# Patient Record
Sex: Female | Born: 1950 | Race: Black or African American | Hispanic: No | Marital: Single
Health system: Midwestern US, Academic
[De-identification: ages and names within clinical notes are randomized; demographics above are authoritative.]

## PROBLEM LIST (undated history)

## (undated) DIAGNOSIS — K2970 Gastritis, unspecified, without bleeding: Secondary | ICD-10-CM

## (undated) DIAGNOSIS — T8859XA Other complications of anesthesia, initial encounter: Secondary | ICD-10-CM

## (undated) DIAGNOSIS — I2510 Atherosclerotic heart disease of native coronary artery without angina pectoris: Secondary | ICD-10-CM

## (undated) DIAGNOSIS — K219 Gastro-esophageal reflux disease without esophagitis: Secondary | ICD-10-CM

## (undated) DIAGNOSIS — C189 Malignant neoplasm of colon, unspecified: Secondary | ICD-10-CM

## (undated) DIAGNOSIS — I4891 Unspecified atrial fibrillation: Secondary | ICD-10-CM

## (undated) DIAGNOSIS — E785 Hyperlipidemia, unspecified: Secondary | ICD-10-CM

## (undated) DIAGNOSIS — I10 Essential (primary) hypertension: Secondary | ICD-10-CM

## (undated) DIAGNOSIS — D509 Iron deficiency anemia, unspecified: Secondary | ICD-10-CM

## (undated) DIAGNOSIS — K648 Other hemorrhoids: Secondary | ICD-10-CM

## (undated) DIAGNOSIS — K5790 Diverticulosis of intestine, part unspecified, without perforation or abscess without bleeding: Secondary | ICD-10-CM

## (undated) DIAGNOSIS — I495 Sick sinus syndrome: Secondary | ICD-10-CM

## (undated) DIAGNOSIS — K644 Residual hemorrhoidal skin tags: Secondary | ICD-10-CM

## (undated) HISTORY — DX: Sick sinus syndrome: I49.5

## (undated) HISTORY — DX: Residual hemorrhoidal skin tags: K64.4

## (undated) HISTORY — DX: Other hemorrhoids: K64.8

## (undated) HISTORY — DX: Gastritis, unspecified, without bleeding: K29.70

## (undated) HISTORY — DX: Diverticulosis of intestine, part unspecified, without perforation or abscess without bleeding: K57.90

## (undated) HISTORY — DX: Gastro-esophageal reflux disease without esophagitis: K21.9

## (undated) HISTORY — DX: Unspecified atrial fibrillation: I48.91

## (undated) HISTORY — DX: Atherosclerotic heart disease of native coronary artery without angina pectoris: I25.10

## (undated) HISTORY — DX: Essential (primary) hypertension: I10

## (undated) HISTORY — DX: Malignant neoplasm of colon, unspecified: C18.9

## (undated) HISTORY — DX: Hyperlipidemia, unspecified: E78.5

## (undated) HISTORY — DX: Iron deficiency anemia, unspecified: D50.9

## (undated) HISTORY — PX: BLADDER REPAIR: SHX76

## (undated) HISTORY — PX: TONSILLECTOMY: SUR1361

---

## 2008-08-30 HISTORY — PX: ABDOMINAL HYSTERECTOMY: SHX81

## 2008-09-13 ENCOUNTER — Encounter (INDEPENDENT_AMBULATORY_CARE_PROVIDER_SITE_OTHER): Payer: Self-pay | Admitting: Obstetrics and Gynecology

## 2008-09-13 ENCOUNTER — Ambulatory Visit (HOSPITAL_COMMUNITY): Admission: RE | Admit: 2008-09-13 | Discharge: 2008-09-14 | Payer: Self-pay | Admitting: Obstetrics and Gynecology

## 2010-07-09 LAB — CBC
HCT: 26.4 % — ABNORMAL LOW (ref 36.0–46.0)
HCT: 26.8 % — ABNORMAL LOW (ref 36.0–46.0)
HCT: 36.8 % (ref 36.0–46.0)
Hemoglobin: 9.3 g/dL — ABNORMAL LOW (ref 12.0–15.0)
Hemoglobin: 9.4 g/dL — ABNORMAL LOW (ref 12.0–15.0)
Hemoglobin: 13.1 g/dL (ref 12.0–15.0)
MCHC: 35.2 g/dL (ref 30.0–36.0)
MCHC: 35.3 g/dL (ref 30.0–36.0)
MCHC: 35.6 g/dL (ref 30.0–36.0)
MCV: 92.1 fL (ref 78.0–100.0)
MCV: 92.5 fL (ref 78.0–100.0)
MCV: 91.2 fL (ref 78.0–100.0)
Platelets: 144 10*3/uL — ABNORMAL LOW (ref 150–400)
Platelets: 145 10*3/uL — ABNORMAL LOW (ref 150–400)
Platelets: 190 10*3/uL (ref 150–400)
RBC: 2.86 MIL/uL — ABNORMAL LOW (ref 3.87–5.11)
RBC: 2.9 MIL/uL — ABNORMAL LOW (ref 3.87–5.11)
RBC: 4.04 MIL/uL (ref 3.87–5.11)
RDW: 13.1 % (ref 11.5–15.5)
RDW: 13.3 % (ref 11.5–15.5)
RDW: 13.4 % (ref 11.5–15.5)
WBC: 10.1 10*3/uL (ref 4.0–10.5)
WBC: 9 10*3/uL (ref 4.0–10.5)
WBC: 5.1 10*3/uL (ref 4.0–10.5)

## 2010-07-09 LAB — BASIC METABOLIC PANEL
BUN: 9 mg/dL (ref 6–23)
CO2: 29 mEq/L (ref 19–32)
Calcium: 10.8 mg/dL — ABNORMAL HIGH (ref 8.4–10.5)
Chloride: 103 mEq/L (ref 96–112)
Creatinine, Ser: 0.71 mg/dL (ref 0.4–1.2)
GFR calc Af Amer: >60 mL/min (ref >60)
GFR calc non Af Amer: >60 mL/min (ref >60)
Glucose, Bld: 92 mg/dL (ref 70–99)
Potassium: 3.9 mEq/L (ref 3.5–5.1)
Sodium: 139 mEq/L (ref 135–145)

## 2010-07-09 LAB — ABO/RH: ABO/RH(D): O POS

## 2010-07-09 LAB — TYPE AND SCREEN
ABO/RH(D): O POS
Antibody Screen: NEGATIVE

## 2010-07-09 LAB — PREGNANCY, URINE: Preg Test, Ur: NEGATIVE

## 2010-07-09 LAB — PROTIME-INR
INR: 0.9 (ref 0.00–1.49)
Prothrombin Time: 12 seconds (ref 11.6–15.2)

## 2010-07-09 LAB — APTT: aPTT: 29 seconds (ref 24–37)

## 2010-08-14 NOTE — Op Note (Signed)
NAME:  Alexandria French, Alexandria French               ACCOUNT NO.:  [id number]   MEDICAL RECORD NO.:  [id number]          PATIENT TYPE:  OIB   LOCATION:  9309                          FACILITY:  [facility]   PHYSICIAN:  Michelle L. Grewal, M.D.DATE OF BIRTH:  03-20-1951   DATE OF PROCEDURE:  09/13/2008  DATE OF DISCHARGE:  09/14/2008                               OPERATIVE REPORT   PREOPERATIVE DIAGNOSES:  Symptomatic uterine prolapse, cystocele and  rectocele.   POSTOPERATIVE DIAGNOSES:  Symptomatic uterine prolapse, cystocele and  rectocele.   PROCEDURE:  Total vaginal hysterectomy, posterior repair, iliococcygeal  ligament fixation in the vaginal cuff and Dr. Sherron Monday, performed  anterior repair, replaced the anterior graft and cystoscopy and that  will be dictated separately by him.   SURGEON:  Michelle L. Vincente Poli, MD   ASSISTANT:  Martina Sinner, MD   ANESTHESIA:  General.   ESTIMATED BLOOD LOSS:  200 mL.   DRAINS:  Foley and vaginal packing.   PATHOLOGY:  Uterus and cervix.   DESCRIPTION OF PROCEDURE:  The patient was taken to the operating room  after informed consent was obtained.  She was then intubated without  difficulty.  She was prepped and draped in the usual sterile fashion.  Exam under anesthesia revealed a grade 3 cystocele, grade 3 cervical  uterine prolapse, and a grade 2 rectocele, and a Foley catheter was  inserted.  Time-out was performed in standard fashion according to  protocol.  A tenaculum was used to grasp the cervix, we were able to  pull the cervix and the uterus all the way and everted all the way out  of the vagina without much difficulty.  A small and circumferential  incision was made around cervix using the Bovie.  We then retracted  using blunt and sharp dissection.  The vaginal epithelium all the way  around the cervix was very elongated because of the prolapse.  I was  then able to identify the posterior cul-de-sac which is approximately 5  cm of the  cervix and entered that sharply using Metzenbaum scissors.  A  weighted speculum was placed into the cul-de-sac.  The anterior vaginal  epithelium was very thickened and hypertrophied probably because of her  long-standing prolapse.  So I used a curved Heaney clamp and placed  across the uterosacral cardinal ligaments on either side.  Each pedicle  was cut and suture ligated using 0 Vicryl suture.  I then used careful  dissection using sharp and blunt dissection anteriorly and finally was  able entered into the anterior cul-de-sac.  I then placed over the  remainder of the broad ligament clamped on either side using curved  Heaney clamp.  The pedicles were secured using a suture ligature of 0  Vicryl suture and free tie of 0 Vicryl suture.  The uterine body was  actually relatively small.  I inspected the inside then peritoneal  cavity.  The adnexa were very high and appeared normal by palpation.  There were no adhesions noted.  At this point, the posterior cuff was  closed in a running stitch using 0 Vicryl in  a running stitch.  At this  point, I left the cuff open Dr. Jacquelyne Balint became the primary surgeon and  he performed a cystoscopy, then an anterior repair with placement of the  graft anteriorly with a fixation of the graft to the sacrospinous  ligament on both sides.  At that point, we then closed the vaginal cuff  completely anterior to posterior.  All sponge, lap, and instrument  counts were complete.  She at that point had much improvement with her  support of the anterior vaginal vault until had some relaxation  posteriorly.  I then decided to perform a posterior repair and  iliococcygeal ligament fixation of the posterior vaginal cuff to pull  that up as well.  I did not want to transfix the cuff to the sacral  spinous ligament with the that would interfere the placement of this  graft.  So I made a V-shaped incision in the perineum and made a  midline incision of the posterior  wall of the vagina, was then able to  reduce the rectocele with sharp and blunt dissection.  I then was able  to dissect through the rectovaginal fascia with my finger and found an  appropriate plane on the right side.  I was able to identify the sacral  spinous ligament and the iliococcygeal muscle just beneath using the  Capio device with Metzenbaum suture.  I then placed a stitch through the  iliococcygeal muscle with correct placement checked x2.  I then  transfixed this to the underside of the vaginal cuff.  I then reduced  the rectocele using interrupted using 0 Vicryl suture and then trimmed a  redundant vaginal epithelium and then closed the vaginal cuff  posteriorly, and the vaginal epithelium in a running stitch using 2-0  Vicryl.  I then reinforced the perineum with a figure-of-eight stitch  using 0 Vicryl suture to help to bring the perineum and perineal muscles  together, and closed the remainder of the skin using 2-0 Vicryl.  At the  end of the procedure, no excessive bleeding was noted.  I placed a  packing with Estrace cream into the vagina.  A Foley catheter was placed  and draining blue urine because Dr. Sherron Monday had ordered indigo  carmine to be given during his procedure.  All sponge, lap, and  instrument counts were correct x2.  The patient was extubated and went  to recovery room.  She was then go upstairs to the third floor for  overnight observation.      Michelle L. Vincente Poli, M.D.  Electronically Signed     MLG/MEDQ  D:  09/14/2008  T:  09/15/2008  Job:  [id number]

## 2010-08-14 NOTE — H&P (Signed)
NAME:  Alexandria French, NAHM               ACCOUNT NO.:  [id number]   MEDICAL RECORD NO.:  [id number]          PATIENT TYPE:  AMB   LOCATION:  SDC                           FACILITY:  [facility]   PHYSICIAN:  Michelle L. Grewal, M.D.DATE OF BIRTH:  September 01, 1950   DATE OF ADMISSION:  DATE OF DISCHARGE:                              HISTORY & PHYSICAL   HISTORY OF PRESENT ILLNESS:  A 60 year old gravida 1, para 1, who  presents today for symptomatic prolapse.  She has uterine prolapse that  has been bothering her for some time.  She states that she feels a  protrusion from her vagina.  She reports it has even progressed over the  past several months.  She has been evaluated completely by Dr.  Jacquelyne Balint, who had recommended surgery.   MEDICATIONS:  1. Simvastatin 20 mg a day.  2. Lisinopril/HCTZ 10/12.5 mg daily.  3. Caltrate with vitamin D.  4. Glucosamine sulfate.  5. Ibuprofen.   SURGICAL HISTORY:  Significant for tubes and history of tonsillectomy.   ALLERGIES:  She has no known allergies.   MEDICAL HISTORY:  Unremarkable except for hypercholesterolemia and  hypertension and arthritis.   FAMILY HISTORY:  Positive for heart disease, osteoporosis, arthritis.   SOCIAL HISTORY:  She is not a smoker.  She does not drink alcohol.   PHYSICAL EXAMINATION:  VITAL SIGNS:  Height 5 feet and 2 inches, weight  148, and BP is 114/80.  GENERAL:  Alert and oriented in no apparent distress.  LUNGS:  Clear to auscultation bilaterally.  CARDIAC:  Regular rate and rhythm.  BREASTS:  Soft, nontender, no mass.  PELVIC:  External genitalia within normal limits.  Vagina and grade 2  cystocele, grade 2-3 cervical uterine prolapse, grade 1-2  rectocele.  No pelvic mass.   IMPRESSION:  Pelvic relaxation, symptomatic.   PLAN:  We will proceed with a TVH.  Dr. Jacquelyne Balint will perform an  anterior repair, possible graft placement, and possible sacral spinous  ligament fixation and I will perform a posterior repair.   Risk and  benefits were reviewed with the patient which include risk of  anesthesia, risk of infection, risk of bleeding, risk of pulmonary  embolism, deep venous thromboembolism, risk of injury to bowel, bladder  and surrounding organs.  All questions are answered.      Michelle L. Vincente Poli, M.D.  Electronically Signed     MLG/MEDQ  D:  09/12/2008  T:  09/13/2008  Job:  [id number]

## 2010-08-14 NOTE — Op Note (Signed)
NAME:  Alexandria French, Alexandria French               ACCOUNT NO.:  [id number]   MEDICAL RECORD NO.:  [id number]          PATIENT TYPE:  OIB   LOCATION:  9309                          FACILITY:  [facility]   PHYSICIAN:  Martina Sinner, MD DATE OF BIRTH:  [birth year]   DATE OF PROCEDURE:  09/13/2008  DATE OF DISCHARGE:                               OPERATIVE REPORT   PREOPERATIVE DIAGNOSES:  Ureteral vaginal vault prolapse, plus  cystocele, plus rectocele.   POSTOPERATIVE DIAGNOSES:  Ureteral vaginal vault prolapse, plus  cystocele, plus rectocele.   SURGERY:  Sacrospinous vault suspension, plus cystocele repair, plus  graft, plus cystoscopy.   PRIMARY SURGEON:  Martina Sinner, MD   ASSISTANT:  Stann Mainland. Grewal, MD   Transvaginal hysterectomy, plus ileococcygeus repair, plus rectocele  repair.   PRIMARY SURGEON:  Michelle L. Vincente Poli, MD   ASSISTANT:  Martina Sinner, MD   [name] has the above diagnosis and consent to the above  procedure.  Preoperative laboratory tests were given.  Preoperative  antibiotics were given.  Preoperative laboratory tests were normal.  Preoperative antibiotics were given.  Please repeat that.  Extra care  was taken to the leg position and preparation to minimize the risk of  compartment syndrome, neuropathy, and DVT.   Dr. Marcelle Overlie did a transvaginal hysterectomy and checked the  ovaries.  This is to be dictated by her.   The posterior cuff was run between 5 and 7 o'clock, and it was opened  when I started my part of the surgery.  She had a very thick cuff  anteriorly and posteriorly.  Blood loss was minimal.  Between 2 Allis  clamps, I made a midline incision to the proximal just to the bladder  and neck.  I have instilled 20 mL of lidocaine epinephrine mixture.  I  sharply dissected the anterior vaginal wall from  underlying pubocervical fascia to the white line bilaterally.  I was  happy with the dissection.  I broke through and could feel  the ischial  spine and sacrospinous ligament bilaterally.  I was very careful in  doing a 2-layer anterior repair, since there was so much redundant  tissue.  I did not want to over imbricating change the anatomy.  After  doing the repair really reduced the cystocele.  I cystoscoped the  patient and was excellent jets bilaterally, a nice cystoscopic reduction  of the cystocele in the midline and no injury to the bladder and  urethra.  I had cystoscoped the patient after the hysterectomy as well  and the anatomy was normal, but she also had a large cystocele.   Zero Ethibond was placed 2 cm medial and caudal to the ischial spine  bilaterally.  I was very pleased with the position of the Ethibond.  I  placed a 0 Vicryl on a RB2 I think through the pelvic sidewall below the  urethrovesical angle.  I sewed a 10 x 6 dermal graft in place cutting  the trapezoid.  The graft surprising did not reach completely to the  urethral vesical angle, so I tied more of  an air knot laying flat  against the tissue and then did an others white line 2-0 Vicryl suture  not under tension.   I trimmed approximately 1.5-2 cm of redundant anterior vaginal wall  bilaterally.  I closed the anterior vaginal wall with running 2-0 Vicryl  on a CT needle.  Dr. Vincente Poli closed the vaginal cuff.   Dr. Vincente Poli then performed a rectocele repair and once site of  ileococcygeus repair to give excellent vaginal length and good support  apically and posteriorly.  I checked the rectum, there is no suture or  injury to the rectum.   Vaginal pack was inserted following the posterior repair.  She had  excellent vaginal length.  She has very good support anteriorly and  posteriorly.  She had minimal neck narrowing at the apex which did not  surprise me, since she is so much thickened tissue in that area.   Blood loss was less than 200 mL.  Urine output was excellent.  Hopefully, the patient will achieve her treatment goal.            ______________________________  Martina Sinner, MD  Electronically Signed     SAM/MEDQ  D:  09/13/2008  T:  09/14/2008  Job:  [id number]

## 2011-12-16 ENCOUNTER — Other Ambulatory Visit: Payer: Self-pay | Admitting: Obstetrics and Gynecology

## 2013-08-13 ENCOUNTER — Encounter: Payer: Self-pay | Admitting: *Deleted

## 2013-08-13 ENCOUNTER — Ambulatory Visit (INDEPENDENT_AMBULATORY_CARE_PROVIDER_SITE_OTHER): Payer: BC Managed Care – PPO | Admitting: Cardiovascular Disease

## 2013-08-13 VITALS — BP 131/85 | HR 68 | Ht 62.0 in | Wt 142.0 lb

## 2013-08-13 DIAGNOSIS — I447 Left bundle-branch block, unspecified: Secondary | ICD-10-CM | POA: Insufficient documentation

## 2013-08-13 DIAGNOSIS — R079 Chest pain, unspecified: Secondary | ICD-10-CM

## 2013-08-13 DIAGNOSIS — R002 Palpitations: Secondary | ICD-10-CM

## 2013-08-13 DIAGNOSIS — R0989 Other specified symptoms and signs involving the circulatory and respiratory systems: Secondary | ICD-10-CM

## 2013-08-13 DIAGNOSIS — R011 Cardiac murmur, unspecified: Secondary | ICD-10-CM

## 2013-08-13 NOTE — Patient Instructions (Signed)
Your physician has requested that you have a lexiscan myoview. For further information please visit [email or web address]. Please follow instruction sheet, as given. Your physician has requested that you have a carotid duplex. This test is an ultrasound of the carotid arteries in your neck. It looks at blood flow through these arteries that supply the brain with blood. Allow one hour for this exam. There are no restrictions or special instructions. Your physician has requested that you have an echocardiogram. Echocardiography is a painless test that uses sound waves to create images of your heart. It provides your doctor with information about the size and shape of your heart and how well your heart's chambers and valves are working. This procedure takes approximately one hour. There are no restrictions for this procedure. Office will contact with results via phone or letter.   Continue all current medications. Follow up in  1 month

## 2013-08-13 NOTE — Progress Notes (Signed)
[name], female   DOB: [birth year], 63 y.o.   MRN: [id number]       CARDIOLOGY CONSULT NOTE  [name] MRN: [id number] DOB/AGE: 02-06-1951 63 y.o.  Admit date: (Not on file) Primary Physician Rory Percy, MD  Reason for Consultation: Chest pain, left bundle branch block  HPI: The patient is a 63 year old woman with a past medical history significant for hyperlipidemia, elevated BP in the past, and GERD. She had an episode of chest pain in February. Dr. Rory Percy saw her for a routine followup visit yesterday and performed an ECG, which demonstrated a new left bundle-branch block. He called me up and requested that I evaluate her.  The ECG performed on 08/12/2013 demonstrated sinus rhythm, heart rate 59 beats per minute, with a left bundle branch block and QRS duration of 126 ms. An ECG from 10/15/2007 demonstrated normal sinus rhythm with a nonspecific ST segment abnormality seen diffusely. The QRS duration at that time was 80 ms. Another ECG from 01/22/2011 demonstrated normal sinus rhythm and a normal QRS duration of 80 ms.  In February, she had been riding in the car with her husband and she was holding her dog. She began to experience right-sided chest pain and pressure which radiated down her right arm. After some time it resolved. She did experience a right-sided chest ache the next day but afterwards this resolved as well. It was not associated with shortness of breath, diaphoresis, nausea, vomiting, nor lightheadedness. It did radiate to the right side of her back. Since that time, she has had very momentary chest pains which she describes as "fleeting" and lasting only seconds on the left side of her chest. 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 Approximately 2 months ago, she began to experience rapid palpitations. She sat down and rested for a few minutes and these also resolved. She has had some mild ankle swelling  but attributes this to varicose veins. She denies orthopnea and paroxysmal nocturnal dyspnea. She denies a personal history of heart disease. She had been on antihypertensive therapy in the past, but when her blood pressure became low, these medications were stopped. She tells me that recently, her blood pressure has been mildly elevated and she has been instructed by Dr. Nadara Mustard to monitor her blood pressure 3 times a week over the next several months to determine if antihypertensive therapy is again warranted.  Fam: One sister with CAD and PCI at age 55. Another older sister with CABG at age 36. Father had congestive heart failure and carotid endarterectomy.   Allergies  Allergen Reactions  . Cefzil [Cefprozil] Other (See Comments)    angioedema    Current Outpatient Prescriptions  Medication Sig Dispense Refill  . Calcium Carbonate-Vitamin D (CALTRATE 600+D) 600-400 MG-UNIT per tablet Take 1 tablet by mouth daily.      . cetirizine (ZYRTEC) 10 MG tablet Take 10 mg by mouth daily.      . Coenzyme Q10 (COQ-10) 100 MG CAPS Take 1 capsule by mouth daily.      . fluticasone (FLONASE) 50 MCG/ACT nasal spray Place 2 sprays into both nostrils daily.      Javier Docker Oil 300 MG CAPS Take 1 capsule by mouth daily.      . simvastatin (ZOCOR) 20 MG tablet Take 20 mg by mouth daily.       No current facility-administered medications for this visit.    Past Medical History  Diagnosis  Date  . Other and unspecified hyperlipidemia   . Esophageal reflux   . Hypertension     Past Surgical History  Procedure Laterality Date  . Bladder repair    . Abdominal hysterectomy  08/2008    History   Social History  . Marital Status: Married    Spouse Name: N/A    Number of Children: N/A  . Years of Education: N/A   Occupational History  . Not on file.   Social History Main Topics  . Smoking status: Never Smoker   . Smokeless tobacco: Never Used  . Alcohol Use: Not on file  . Drug Use: Not on  file  . Sexual Activity: Not on file   Other Topics Concern  . Not on file   Social History Narrative  . No narrative on file       Prior to Admission medications   Medication Sig Start Date End Date Taking? Authorizing Provider  Calcium Carbonate-Vitamin D (CALTRATE 600+D) 600-400 MG-UNIT per tablet Take 1 tablet by mouth daily.   Yes Historical Provider, MD  cetirizine (ZYRTEC) 10 MG tablet Take 10 mg by mouth daily.   Yes Historical Provider, MD  Coenzyme Q10 (COQ-10) 100 MG CAPS Take 1 capsule by mouth daily.   Yes Historical Provider, MD  fluticasone (FLONASE) 50 MCG/ACT nasal spray Place 2 sprays into both nostrils daily.   Yes Historical Provider, MD  Javier Docker Oil 300 MG CAPS Take 1 capsule by mouth daily.   Yes Historical Provider, MD  simvastatin (ZOCOR) 20 MG tablet Take 20 mg by mouth daily.   Yes Historical Provider, MD     Review of systems complete and found to be negative unless listed above in HPI     Physical exam Blood pressure 131/85, pulse 68, height 5\' 2"  (1.575 m), weight 142 lb (64.411 kg). General: NAD Neck: No JVD, no thyromegaly or thyroid nodule.  Lungs: Clear to auscultation bilaterally with normal respiratory effort. CV: Nondisplaced PMI.  Heart regular S1/split S2, no S3/S4, II/VI holosystolic murmur at RUSB and along left sternal border.  No peripheral edema. Varicose veins noted. Bilateral carotid bruits.  Normal pedal pulses.  Abdomen: Soft, nontender, no hepatosplenomegaly, no distention.  Skin: Intact without lesions or rashes.  Neurologic: Alert and oriented x 3.  Psych: Normal affect. Extremities: No clubbing or cyanosis.  HEENT: Normal.   Labs:   Lab Results  Component Value Date   WBC 9.0 09/14/2008   HGB 9.3* 09/14/2008   HCT 26.4* 09/14/2008   MCV 92.1 09/14/2008   PLT 145* 09/14/2008   No results found for this basename: NA, K, CL, CO2, BUN, CREATININE, CALCIUM, LABALBU, PROT, BILITOT, ALKPHOS, ALT, AST, GLUCOSE,  in the last 168  hours No results found for this basename: CKTOTAL, CKMB, CKMBINDEX, TROPONINI    No results found for this basename: CHOL   No results found for this basename: HDL   No results found for this basename: LDLCALC   No results found for this basename: TRIG   No results found for this basename: CHOLHDL   No results found for this basename: LDLDIRECT         Studies: No results found.  ASSESSMENT AND PLAN:  1. New left bundle branch block with chest pain: While her symptoms of chest pain and appeared to be atypical for a cardiac etiology, she does have risk factors for heart disease which include a previous diagnosis of hypertension along with hyperlipidemia which is being treated with simvastatin.  She also has a significant family history of heart disease and peripheral vascular disease. I will obtain an echocardiogram to assess left ventricular systolic function and regional wall motion. I will also obtain a Lexiscan Cardiolite stress test to evaluate for myocardial ischemia. 2. Murmur: Given her left bundle branch block, she may have calcification and possibly aortic valve sclerosis. In order to further characterize this, I will obtain an echocardiogram. 3. Bilateral carotid bruits: While this may be a transmission of her cardiac murmur, there is some concern for carotid artery stenosis. Her father had a carotid endarterectomy. She has a personal history of hyperlipidemia. I will obtain bilateral carotid Dopplers. 4. Palpitations: She apparently only had one episode of this. I will continue to monitor her for recurrences.  Dispo: f/u 1 month.  Signed: Kate Sable, M.D., F.A.C.C.  08/13/2013, 4:05 PM

## 2013-08-24 ENCOUNTER — Encounter (INDEPENDENT_AMBULATORY_CARE_PROVIDER_SITE_OTHER): Payer: BC Managed Care – PPO

## 2013-08-24 DIAGNOSIS — R0989 Other specified symptoms and signs involving the circulatory and respiratory systems: Secondary | ICD-10-CM

## 2013-08-24 DIAGNOSIS — I6529 Occlusion and stenosis of unspecified carotid artery: Secondary | ICD-10-CM

## 2013-08-25 ENCOUNTER — Encounter (HOSPITAL_COMMUNITY)
Admission: RE | Admit: 2013-08-25 | Discharge: 2013-08-25 | Disposition: A | Discharge status: Home / Self Care | Payer: BC Managed Care – PPO | Source: Ambulatory Visit | Attending: Cardiovascular Disease | Admitting: Cardiovascular Disease

## 2013-08-25 ENCOUNTER — Encounter (HOSPITAL_COMMUNITY): Payer: Self-pay

## 2013-08-25 ENCOUNTER — Ambulatory Visit (HOSPITAL_COMMUNITY)
Admission: RE | Admit: 2013-08-25 | Discharge: 2013-08-25 | Disposition: A | Discharge status: Home / Self Care | Payer: BC Managed Care – PPO | Source: Ambulatory Visit | Attending: Cardiovascular Disease | Admitting: Cardiovascular Disease

## 2013-08-25 DIAGNOSIS — I10 Essential (primary) hypertension: Secondary | ICD-10-CM | POA: Insufficient documentation

## 2013-08-25 DIAGNOSIS — I447 Left bundle-branch block, unspecified: Secondary | ICD-10-CM | POA: Insufficient documentation

## 2013-08-25 DIAGNOSIS — E785 Hyperlipidemia, unspecified: Secondary | ICD-10-CM | POA: Insufficient documentation

## 2013-08-25 DIAGNOSIS — R079 Chest pain, unspecified: Secondary | ICD-10-CM

## 2013-08-25 DIAGNOSIS — I369 Nonrheumatic tricuspid valve disorder, unspecified: Secondary | ICD-10-CM

## 2013-08-25 DIAGNOSIS — R002 Palpitations: Secondary | ICD-10-CM | POA: Insufficient documentation

## 2013-08-25 DIAGNOSIS — R011 Cardiac murmur, unspecified: Secondary | ICD-10-CM | POA: Insufficient documentation

## 2013-08-25 MED ORDER — TECHNETIUM TC 99M SESTAMIBI GENERIC - CARDIOLITE
10.0000 | Freq: Once | INTRAVENOUS | Status: AC | PRN
  Administered 2013-08-25: 10 via INTRAVENOUS

## 2013-08-25 MED ORDER — SODIUM CHLORIDE 0.9 % IJ SOLN
INTRAMUSCULAR | Status: AC
  Administered 2013-08-25: 10 mL via INTRAVENOUS
  Filled 2013-08-25: qty 10

## 2013-08-25 MED ORDER — REGADENOSON 0.4 MG/5ML IV SOLN
INTRAVENOUS | Status: AC
  Administered 2013-08-25: 0.4 mg via INTRAVENOUS
  Filled 2013-08-25: qty 5

## 2013-08-25 MED ORDER — TECHNETIUM TC 99M SESTAMIBI - CARDIOLITE
30.0000 | Freq: Once | INTRAVENOUS | Status: AC | PRN
  Administered 2013-08-25: 12:00:00 30 via INTRAVENOUS

## 2013-08-25 NOTE — Progress Notes (Signed)
  Echocardiogram 2D Echocardiogram has been performed.  Alexandria French Alexandria French 08/25/2013, 9:23 AM

## 2013-08-25 NOTE — Progress Notes (Signed)
Stress Lab Nurses Notes - [facility] 08/25/2013 Reason for doing test: Chest Pain and LBBB Type of test: Wille Glaser Nurse performing test: Gerrit Halls, RN Nuclear Medicine Tech: Redmond Baseman Echo Tech: Not Applicable MD performing test: S. McDowell/K.LawrenceNP Family MD: Nadara Mustard Test explained and consent signed: yes IV started: 22g jelco, Saline lock flushed, No redness or edema and Saline lock started in radiology Symptoms: none Treatment/Intervention: None Reason test stopped: protocol completed After recovery IV was: Discontinued via X-ray tech and No redness or edema Patient to return to [facility]. Med at : 12:10 Patient discharged: Home Patient's Condition upon discharge was: stable Comments: During test BP 156/101 & HR 64.  Recovery BP 144/99 & HR 60.  Symptoms resolved in recovery. Donnajean Lopes

## 2013-09-06 ENCOUNTER — Telehealth: Payer: Self-pay | Admitting: *Deleted

## 2013-09-06 NOTE — Telephone Encounter (Signed)
Notes Recorded by Laurine Blazer, LPN on 07/06/4257 at 56:38 AM Husband Peterson Ao) notified. Follow up OV scheduled for 09/10/2013 with Dr. Bronson Ing.

## 2013-09-06 NOTE — Telephone Encounter (Signed)
CAROTID DOPPLER -  Notes Recorded by Herminio Commons, MD on 08/25/2013 at 4:23 PM Mild disease bilaterally.  ECHO -  Notes Recorded by Herminio Commons, MD on 08/25/2013 at 12:57 PM Overall good pumping function.  STRESS TEST -  Notes Recorded by Herminio Commons, MD on 08/25/2013 at 4:22 PM Please inform pt of low risk results.

## 2013-09-10 ENCOUNTER — Ambulatory Visit (INDEPENDENT_AMBULATORY_CARE_PROVIDER_SITE_OTHER): Payer: BC Managed Care – PPO | Admitting: Cardiovascular Disease

## 2013-09-10 ENCOUNTER — Ambulatory Visit: Payer: BC Managed Care – PPO | Admitting: Cardiovascular Disease

## 2013-09-10 ENCOUNTER — Encounter: Payer: Self-pay | Admitting: Cardiovascular Disease

## 2013-09-10 VITALS — BP 151/81 | HR 82 | Ht 62.0 in | Wt 141.0 lb

## 2013-09-10 DIAGNOSIS — R5381 Other malaise: Secondary | ICD-10-CM

## 2013-09-10 DIAGNOSIS — Z136 Encounter for screening for cardiovascular disorders: Secondary | ICD-10-CM

## 2013-09-10 DIAGNOSIS — I10 Essential (primary) hypertension: Secondary | ICD-10-CM

## 2013-09-10 DIAGNOSIS — IMO0001 Reserved for inherently not codable concepts without codable children: Secondary | ICD-10-CM

## 2013-09-10 DIAGNOSIS — I447 Left bundle-branch block, unspecified: Secondary | ICD-10-CM

## 2013-09-10 DIAGNOSIS — I079 Rheumatic tricuspid valve disease, unspecified: Secondary | ICD-10-CM

## 2013-09-10 DIAGNOSIS — I739 Peripheral vascular disease, unspecified: Secondary | ICD-10-CM

## 2013-09-10 DIAGNOSIS — R002 Palpitations: Secondary | ICD-10-CM

## 2013-09-10 DIAGNOSIS — R079 Chest pain, unspecified: Secondary | ICD-10-CM

## 2013-09-10 DIAGNOSIS — R5383 Other fatigue: Secondary | ICD-10-CM

## 2013-09-10 DIAGNOSIS — I071 Rheumatic tricuspid insufficiency: Secondary | ICD-10-CM

## 2013-09-10 DIAGNOSIS — I779 Disorder of arteries and arterioles, unspecified: Secondary | ICD-10-CM

## 2013-09-10 MED ORDER — LISINOPRIL 5 MG PO TABS: 5.0000 mg | ORAL_TABLET | Freq: Every day | ORAL | Status: DC

## 2013-09-10 NOTE — Progress Notes (Addendum)
[name], female   DOB: October 31, 1950, 63 y.o.   MRN: [id number]      SUBJECTIVE: The patient is here to followup on the results of cardiovascular testing performed for the evaluation of a new left bundle branch block and chest pain. Echocardiography demonstrated normal left ventricular systolic function, EF 57-32%, mild LVH, grade 1 diastolic dysfunction, and mild tricuspid regurgitation. Lexiscan Cardiolite stress test was normal. Carotid Dopplers demonstrated mild carotid artery disease bilaterally. She has had no further recurrences of chest pain. Her husband says she has severe acid reflux disease. She has occasional headaches and does fatigue in the afternoons.  She had been on lisinopril 5 mg with hydrochlorothiazide 12.5 mg daily and then this was reduced in frequency to every other day as her blood pressure would sometimes get too low. She has been off of antihypertensives for the past 2 years. Blood pressures at home have ranged from 99-162/50s-70s.    Allergies  Allergen Reactions  . Cefzil [Cefprozil] Other (See Comments)    angioedema    Current Outpatient Prescriptions  Medication Sig Dispense Refill  . Calcium Carbonate-Vitamin D (CALTRATE 600+D) 600-400 MG-UNIT per tablet Take 1 tablet by mouth daily.      . cetirizine (ZYRTEC) 10 MG tablet Take 10 mg by mouth daily.      . Coenzyme Q10 (COQ-10) 100 MG CAPS Take 1 capsule by mouth daily.      . fluticasone (FLONASE) 50 MCG/ACT nasal spray Place 2 sprays into both nostrils daily.      Javier Docker Oil 300 MG CAPS Take 1 capsule by mouth daily.      . simvastatin (ZOCOR) 20 MG tablet Take 20 mg by mouth daily.       No current facility-administered medications for this visit.    Past Medical History  Diagnosis Date  . Other and unspecified hyperlipidemia   . Esophageal reflux   . Hypertension     Past Surgical History  Procedure Laterality Date  . Bladder repair    . Abdominal hysterectomy  08/2008     History   Social History  . Marital Status: Married    Spouse Name: N/A    Number of Children: N/A  . Years of Education: N/A   Occupational History  . Not on file.   Social History Main Topics  . Smoking status: Never Smoker   . Smokeless tobacco: Never Used  . Alcohol Use: Not on file  . Drug Use: Not on file  . Sexual Activity: Not on file   Other Topics Concern  . Not on file   Social History Narrative  . No narrative on file    BP 151/81  Pulse 82    PHYSICAL EXAM General: NAD  Neck: No JVD, no thyromegaly or thyroid nodule.  Lungs: Clear to auscultation bilaterally with normal respiratory effort.  CV: Nondisplaced PMI. Heart regular S1/split S2, no S3/S4, II/VI holosystolic murmur at RUSB and along left sternal border. No peripheral edema. Varicose veins noted. Bilateral carotid bruits. Normal pedal pulses.  Abdomen: Soft, nontender, no hepatosplenomegaly, no distention.  Skin: Intact without lesions or rashes.  Neurologic: Alert and oriented x 3.  Psych: Normal affect.  Extremities: No clubbing or cyanosis.  HEENT: Normal.   ECG: reviewed and available in electronic records.      ASSESSMENT AND PLAN: 1. New left bundle branch block with chest pain: No further recurrences. Normal nuclear stress test. No further testing is indicated. 2. Tricuspid regurgitation: Only  mild with normal pulmonary pressures. 3. Carotid artery disease: Bilateral carotid Dopplers demonstrated mild disease bilaterally. Will repeat in 2 years. 4. Palpitations: She apparently only had one episode of this. I will continue to monitor her for recurrences. 5. HTN: Will start lisinopril 5 mg daily and have her continue to monitor her BP.  Dispo: f/u in October.  Kate Sable, M.D., F.A.C.C.

## 2013-09-10 NOTE — Patient Instructions (Signed)
   Begin Lisinopril 5mg  daily - new sent to pharm Continue all other medications.   Follow up in  4 months

## 2013-09-10 NOTE — Addendum Note (Signed)
Addended by: Laurine Blazer on: 09/10/2013 04:28 PM   Modules accepted: Orders

## 2014-01-18 ENCOUNTER — Encounter: Payer: Self-pay | Admitting: Cardiovascular Disease

## 2014-01-18 ENCOUNTER — Ambulatory Visit (INDEPENDENT_AMBULATORY_CARE_PROVIDER_SITE_OTHER): Payer: BC Managed Care – PPO | Admitting: Cardiovascular Disease

## 2014-01-18 VITALS — BP 120/75 | HR 70 | Ht 62.0 in | Wt 142.8 lb

## 2014-01-18 DIAGNOSIS — I779 Disorder of arteries and arterioles, unspecified: Secondary | ICD-10-CM

## 2014-01-18 DIAGNOSIS — I10 Essential (primary) hypertension: Secondary | ICD-10-CM

## 2014-01-18 DIAGNOSIS — R079 Chest pain, unspecified: Secondary | ICD-10-CM

## 2014-01-18 DIAGNOSIS — R002 Palpitations: Secondary | ICD-10-CM

## 2014-01-18 DIAGNOSIS — I739 Peripheral vascular disease, unspecified: Secondary | ICD-10-CM

## 2014-01-18 DIAGNOSIS — I071 Rheumatic tricuspid insufficiency: Secondary | ICD-10-CM

## 2014-01-18 DIAGNOSIS — I447 Left bundle-branch block, unspecified: Secondary | ICD-10-CM

## 2014-01-18 MED ORDER — LISINOPRIL 20 MG PO TABS: 10.0000 mg | ORAL_TABLET | Freq: Every day | ORAL | Status: DC

## 2014-01-18 NOTE — Progress Notes (Signed)
[name], female   DOB: 1951-02-19, 63 y.o.   MRN: [id number]      SUBJECTIVE: The patient returns for routine follow up. She has a history of left bundle branch block and chest pain. Echocardiography demonstrated normal left ventricular systolic function, EF 14-43%, mild LVH, grade 1 diastolic dysfunction, and mild tricuspid regurgitation. Lexiscan Cardiolite stress test was normal. Carotid Dopplers demonstrated mild carotid artery disease bilaterally.   She has had no further recurrences of chest pain. She has severe acid reflux disease and hypertension.   She has been feeling well and denies any significant chest pain or shortness of breath. She also denies leg swelling. Her blood pressure had been elevated at a primary care office visit and her lisinopril was increased from 5 mg to 20 mg. She then began to feel very sluggish and fatigued and systolic blood pressures were routinely running in the 90 mm mercury range. She then cut this in half to 10 mg daily and has been feeling very well. She does continue to monitor her blood pressures and they have been normal, with systolic readings in the 154-008 mm mercury range. She has had some issues with restless feet and was prescribed a medication but did not start taking this. She denies leg and feet cramping.    Review of Systems: As per "subjective", otherwise negative.  Allergies  Allergen Reactions  . Cefzil [Cefprozil] Other (See Comments)    angioedema    Current Outpatient Prescriptions  Medication Sig Dispense Refill  . aspirin 81 MG tablet Take 81 mg by mouth daily.      . Calcium Carbonate-Vitamin D (CALTRATE 600+D) 600-400 MG-UNIT per tablet Take 1 tablet by mouth daily.      . cetirizine (ZYRTEC) 10 MG tablet Take 10 mg by mouth daily.      . Coenzyme Q10 (COQ-10) 100 MG CAPS Take 1 capsule by mouth daily.      . fluticasone (FLONASE) 50 MCG/ACT nasal spray Place 2 sprays into both nostrils daily.      .  hydrochlorothiazide (MICROZIDE) 12.5 MG capsule Take 12.5 mg by mouth daily.      Javier Docker Oil 300 MG CAPS Take 1 capsule by mouth daily.      Marland Kitchen lisinopril (PRINIVIL,ZESTRIL) 20 MG tablet Take 20 mg by mouth daily.      . simvastatin (ZOCOR) 20 MG tablet Take 20 mg by mouth daily.       No current facility-administered medications for this visit.    Past Medical History  Diagnosis Date  . Other and unspecified hyperlipidemia   . Esophageal reflux   . Hypertension     Past Surgical History  Procedure Laterality Date  . Bladder repair    . Abdominal hysterectomy  08/2008    History   Social History  . Marital Status: Married    Spouse Name: N/A    Number of Children: N/A  . Years of Education: N/A   Occupational History  . Not on file.   Social History Main Topics  . Smoking status: Never Smoker   . Smokeless tobacco: Never Used  . Alcohol Use: No  . Drug Use: No  . Sexual Activity: Not on file   Other Topics Concern  . Not on file   Social History Narrative  . No narrative on file     Filed Vitals:   01/18/14 0959  Height: 5\' 2"  (1.575 m)  Weight: 142 lb 12 oz (64.751 kg)  BP 120/75  Pulse 70   PHYSICAL EXAM General: NAD  Neck: No JVD, no thyromegaly or thyroid nodule.  Lungs: Clear to auscultation bilaterally with normal respiratory effort.  CV: Nondisplaced PMI. Heart regular S1/split S2, no S3/S4, II/VI holosystolic murmur at RUSB and along left sternal border. No peripheral edema. Varicose veins noted. Bilateral carotid bruits. Normal pedal pulses.  Abdomen: Soft, nontender, no hepatosplenomegaly, no distention.  Skin: Intact without lesions or rashes.  Neurologic: Alert and oriented x 3.  Psych: Normal affect.  Extremities: No clubbing  Skin: Normal. Musculoskeletal: Normal range of motion, no gross deformities. Extremities: No clubbing or cyanosis.   ECG: Most recent ECG reviewed.      ASSESSMENT AND PLAN: 1. Left bundle branch block  with chest pain: No further recurrences. Normal nuclear stress test. No further testing is indicated.  2. Tricuspid regurgitation: Only mild with normal pulmonary pressures.  3. Carotid artery disease: Bilateral carotid Dopplers demonstrated mild disease bilaterally. Will repeat in 2 years. 4. Palpitations: She apparently only had one episode of this with no recurrences thus far. Will continue to monitor. 5. Essential HTN: Will continue lisinopril 10 mg daily and have her continue to monitor her BP. Thus far, they have been normal thus no changes to treatment.  Dispo: f/u 6 months.  Kate Sable, M.D., F.A.C.C.

## 2014-01-18 NOTE — Patient Instructions (Signed)
Continue all current medications. Your physician wants you to follow up in: 6 months.  You will receive a reminder letter in the mail one-two months in advance.  If you don't receive a letter, please call our office to schedule the follow up appointment   

## 2014-04-21 ENCOUNTER — Encounter: Payer: Self-pay | Admitting: Cardiovascular Disease

## 2014-04-21 ENCOUNTER — Ambulatory Visit (INDEPENDENT_AMBULATORY_CARE_PROVIDER_SITE_OTHER): Payer: BC Managed Care – PPO | Admitting: Cardiovascular Disease

## 2014-04-21 ENCOUNTER — Telehealth: Payer: Self-pay | Admitting: Cardiovascular Disease

## 2014-04-21 ENCOUNTER — Encounter: Payer: Self-pay | Admitting: *Deleted

## 2014-04-21 VITALS — BP 137/82 | HR 68 | Ht 62.0 in | Wt 142.0 lb

## 2014-04-21 DIAGNOSIS — I10 Essential (primary) hypertension: Secondary | ICD-10-CM

## 2014-04-21 DIAGNOSIS — R002 Palpitations: Secondary | ICD-10-CM

## 2014-04-21 DIAGNOSIS — I447 Left bundle-branch block, unspecified: Secondary | ICD-10-CM

## 2014-04-21 DIAGNOSIS — I779 Disorder of arteries and arterioles, unspecified: Secondary | ICD-10-CM

## 2014-04-21 DIAGNOSIS — I739 Peripheral vascular disease, unspecified: Secondary | ICD-10-CM

## 2014-04-21 DIAGNOSIS — I9589 Other hypotension: Secondary | ICD-10-CM

## 2014-04-21 DIAGNOSIS — R079 Chest pain, unspecified: Secondary | ICD-10-CM

## 2014-04-21 MED ORDER — LISINOPRIL 5 MG PO TABS: ORAL_TABLET | ORAL | Status: DC

## 2014-04-21 MED ORDER — HYDROCHLOROTHIAZIDE 12.5 MG PO CAPS: ORAL_CAPSULE | ORAL | Status: DC

## 2014-04-21 NOTE — Patient Instructions (Addendum)
Your physician recommends that you schedule a follow-up appointment in: 6-8 weeks with Dr. Bronson Ing  Your physician has recommended you make the following change in your medication:   START HYDROCHLOROTHIAZIDE 12.5 MG AS NEEDED FOR LEG SWELLING  START LISINOPRIL 5 MG AS NEEDED FOR BP OVER 140/90.   Your physician has recommended that you wear an event monitor FOR 30 DAYS. Event monitors are medical devices that record the heart's electrical activity. Doctors most often [place] these monitors to diagnose arrhythmias. Arrhythmias are problems with the speed or rhythm of the heartbeat. The monitor is a small, portable device. You can wear one while you do your normal daily activities. This is usually used to diagnose what is causing palpitations/syncope (passing out).  Thank you for choosing [facility]!!

## 2014-04-21 NOTE — Telephone Encounter (Signed)
Patient advised to come in this afternoon for an evaluation.

## 2014-04-21 NOTE — Telephone Encounter (Signed)
Felt light headed last night with no pain. Stated that she almost passed out if it had not been for her husband putting cold wash clothes on her head.  Pulse rate was 104 with BP of  112/91 at this time.  She would like for someone to contact her today. Office did not have any openings for today to offer her an appointment.

## 2014-04-21 NOTE — Progress Notes (Signed)
[name], female   DOB: [birth year], 64 y.o.   MRN: [id number]      SUBJECTIVE: The patient presents for lightheadedness and near syncope last night. HR was 104 bpm and BP 112/91.  She has a history of left bundle branch block and chest pain. Echocardiography in 5/15 demonstrated normal left ventricular systolic function, EF 11-57%, mild LVH, grade 1 diastolic dysfunction, and mild tricuspid regurgitation. Lexiscan Cardiolite stress test in 5/15 was normal. Carotid Dopplers demonstrated mild carotid artery disease bilaterally.  She also has a history of severe GERD and hypertension. Orthostatics checked today in the office and were normal.  In December, her BP had been fluctuating with occasional systolic readings in the 80 range, and then up to the 130 range. She had been experiencing frequent palpitations, but none for the past 2 weeks. Last night, she was afraid as she thought she was going to pass out. She has been taking a combination pill lisinopril 10 mg-HCTZ 12.5 mg daily.    Review of Systems: As per "subjective", otherwise negative.  Allergies  Allergen Reactions  . Cefzil [Cefprozil] Other (See Comments)    angioedema    Current Outpatient Prescriptions  Medication Sig Dispense Refill  . aspirin 81 MG tablet Take 81 mg by mouth daily.    . Calcium Carbonate-Vitamin D (CALTRATE 600+D) 600-400 MG-UNIT per tablet Take 1 tablet by mouth daily.    . cetirizine (ZYRTEC) 10 MG tablet Take 10 mg by mouth daily.    . Coenzyme Q10 (COQ-10) 100 MG CAPS Take 1 capsule by mouth daily.    . fluticasone (FLONASE) 50 MCG/ACT nasal spray Place 2 sprays into both nostrils daily.    . hydrochlorothiazide (MICROZIDE) 12.5 MG capsule Take 12.5 mg by mouth daily.    Javier Docker Oil 300 MG CAPS Take 1 capsule by mouth daily.    Marland Kitchen lisinopril (PRINIVIL,ZESTRIL) 20 MG tablet Take 0.5 tablets (10 mg total) by mouth daily.    . simvastatin (ZOCOR) 20 MG tablet Take 20 mg by mouth daily.      No current facility-administered medications for this visit.    Past Medical History  Diagnosis Date  . Other and unspecified hyperlipidemia   . Esophageal reflux   . Hypertension     Past Surgical History  Procedure Laterality Date  . Bladder repair    . Abdominal hysterectomy  08/2008    History   Social History  . Marital Status: Married    Spouse Name: N/A    Number of Children: N/A  . Years of Education: N/A   Occupational History  . Not on file.   Social History Main Topics  . Smoking status: Never Smoker   . Smokeless tobacco: Never Used  . Alcohol Use: No  . Drug Use: No  . Sexual Activity: Not on file   Other Topics Concern  . Not on file   Social History Narrative  . No narrative on file      PHYSICAL EXAM  BP 121/73, 137/82, 137/82  Pulse 68, 71, 68  SpO2 97%, 95%, 96%   General: NAD  Neck: No JVD, no thyromegaly or thyroid nodule.  Lungs: Clear to auscultation bilaterally with normal respiratory effort.  CV: Nondisplaced PMI. Heart regular S1/split S2, no S3/S4, II/VI holosystolic murmur at RUSB and along left sternal border. No peripheral edema. Varicose veins noted. Bilateral carotid bruits. Normal pedal pulses.  Abdomen: Soft, nontender, no hepatosplenomegaly, no distention.  Skin: Intact without lesions or rashes.  Neurologic: Alert and oriented x 3.  Psych: Normal affect.  Extremities: No clubbing  Skin: Normal. Musculoskeletal: Normal range of motion, no gross deformities. Extremities: No clubbing or cyanosis.   ECG: Most recent ECG reviewed.    ASSESSMENT AND PLAN: 1. Left bundle branch block with chest pain: No further recurrences. Normal nuclear stress test. No further testing is indicated.  2. Tricuspid regurgitation: Only mild with normal pulmonary pressures.  3. Carotid artery disease: Bilateral carotid Dopplers demonstrated mild disease bilaterally. Will repeat in 2 years. 4. Palpitations: Will order a  30-day event monitor. 5. Essential HTN: Given BP fluctuations, I have instructed her to check her BP every morning and evening, and to only take lisinopril 5 mg if BP >/= 140/90. I will only have her take HCTZ 12.5 mg as needed for significant ankle/leg swelling, but would prefer the use of compression stockings so as to avoid intravascular volume depletion.  Dispo: f/u 6-8 weeks.   Kate Sable, M.D., F.A.C.C.

## 2014-04-28 DIAGNOSIS — R55 Syncope and collapse: Secondary | ICD-10-CM | POA: Diagnosis not present

## 2014-05-03 DIAGNOSIS — A419 Sepsis, unspecified organism: Secondary | ICD-10-CM

## 2014-05-03 MED ORDER — flu vaccine (seasonal < 65 yr)(PF) IM 0.5 mL
INTRAMUSCULAR | Status: AC
Start: 2014-05-03 — End: 2014-05-11

## 2014-05-03 MED ORDER — senna-docusate (SENNA-S) 8.6-50 mg per tablet 1 tablet
8.6-50 | Freq: Two times a day (BID) | ORAL | Status: AC
Start: 2014-05-03 — End: 2014-05-11
  Administered 2014-05-04 – 2014-05-11 (×8): 1 via ORAL

## 2014-05-03 MED ORDER — propofol (DIPRIVAN) infusion 10 mg/mL
10 | INTRAVENOUS | Status: AC
Start: 2014-05-03 — End: 2014-05-05
  Administered 2014-05-04: 12:00:00 10 ug/kg/min via INTRAVENOUS
  Administered 2014-05-04: 05:00:00 20 ug/kg/min via INTRAVENOUS
  Administered 2014-05-05: 15:00:00 15 ug/kg/min via INTRAVENOUS
  Administered 2014-05-05: 05:00:00 10 ug/kg/min via INTRAVENOUS

## 2014-05-03 MED ORDER — gelatin adsorbable (GELFOAM 12 X 7) 12-7 mm sponge
12-7 | TOPICAL | Status: AC
Start: 2014-05-03 — End: 2014-05-04
  Administered 2014-05-04: 05:00:00 1 via TOPICAL

## 2014-05-03 MED ORDER — fentaNYL (SUBLIMAZE) 2,000 mcg in sodium chloride 0.9 % 200 mL infusion
50 | INTRAMUSCULAR | Status: AC
Start: 2014-05-03 — End: 2014-05-07
  Administered 2014-05-04: 05:00:00 75 ug/h via INTRAVENOUS
  Administered 2014-05-04 – 2014-05-05 (×3): 200 ug/h via INTRAVENOUS
  Administered 2014-05-06: 14:00:00 100 ug/h via INTRAVENOUS
  Administered 2014-05-06: 04:00:00 150 ug/h via INTRAVENOUS

## 2014-05-03 MED ORDER — ondansetron (ZOFRAN) 4 mg/2 mL injection 4 mg
4 | INTRAMUSCULAR | Status: AC | PRN
Start: 2014-05-03 — End: 2014-05-11

## 2014-05-03 MED ORDER — famotidine (PEPCID) tablet 20 mg
20 | Freq: Two times a day (BID) | ORAL | Status: AC
Start: 2014-05-03 — End: 2014-05-04
  Administered 2014-05-04 – 2014-05-05 (×2): 20 mg via ORAL

## 2014-05-03 MED FILL — GELFOAM SPONGE SIZE 12-7MM 12 MM-7 MM: 12-7 12-7 mm | TOPICAL | Qty: 2

## 2014-05-03 MED FILL — FENTANYL (PF) 50 MCG/ML INJECTION SOLUTION: 50 50 mcg/mL | INTRAMUSCULAR | Qty: 40

## 2014-05-03 NOTE — Unmapped (Signed)
Problem: Non-violent restraint safety  Goal: Patient will be free from injury during restraint period  Assess and monitor for unsafe behaviors that can include unintentional harm of self or others, risk for joint dislocation related to post-operative status, risks for potential nutrition/hydration issues related to removing/tampering with medical equipment, protection of medical procedures, or protection of medical device access.  Pt in blilat soft wrist restraints for protection of medical devices. Pt unable to follow directions. Pt attempts to pull at lines/tubes. Will monitor for safety and continued use.   Pt intubated and sedated. Pt unable to follow directions pt attempts to pull at lines/tubes. Will keep BSWR in place for protection of medical devices

## 2014-05-03 NOTE — Unmapped (Signed)
CCM Attending    Patient seen and evaluated  Chart/labs/meds/vitals/xrays reviewed  Case discussed with resident/plans outlined.    Transfer from outside hospital with AMS, acute respiratory failure, aki, coagulopathy. Intubated prior to transfer. Poor LV function, new onset renal dysfunction. Hyperkalemia noted. Will place on lasix drip. Dobutrex if needed. Consult renal. Doubt FHF.    45 minutes total critical care time on 05/03/2014

## 2014-05-03 NOTE — Unmapped (Signed)
Patient arrived via EMS from [facility]. Patient in BSWR, and sedated with prop and fentanyl gtt. Patient transferred to bed and placed on vent with no complications. Patients skin dry and intact. Patient currently being monitored by CMU, alarms on. Patient appears to be in no distress. RT and MD in room to assess patient. Labs drawn and sent. MD and RT at bedside. Patients family updated. Will continue to monitor.      Laddie Aquas, RN

## 2014-05-04 ENCOUNTER — Inpatient Hospital Stay
Admit: 2014-05-04 | Discharge: 2014-05-11 | Disposition: A | Discharge status: Hospice | Payer: PRIVATE HEALTH INSURANCE | Source: Other Acute Inpatient Hospital

## 2014-05-04 ENCOUNTER — Inpatient Hospital Stay: Admit: 2014-05-04 | Payer: PRIVATE HEALTH INSURANCE

## 2014-05-04 ENCOUNTER — Inpatient Hospital Stay: Admit: 2014-05-05 | Payer: PRIVATE HEALTH INSURANCE

## 2014-05-04 LAB — URINALYSIS W/RFL TO MICROSCOPIC
Bilirubin, UA: NEGATIVE
Bilirubin, UA: NEGATIVE
Glucose, UA: NEGATIVE mg/dL
Glucose, UA: NEGATIVE mg/dL
Ketones, UA: NEGATIVE mg/dL
Ketones, UA: NEGATIVE mg/dL
Leukocyte Esterase, UA: NEGATIVE
Nitrite, UA: NEGATIVE
Nitrite, UA: NEGATIVE
Protein, UA: NEGATIVE mg/dL
Protein, UA: NEGATIVE mg/dL
RBC, UA: 7 /HPF — ABNORMAL HIGH (ref 0–3)
RBC, UA: 6 /HPF (ref 0–3)
Specific Gravity, UA: 1.009 (ref 1.005–1.035)
Specific Gravity, UA: 1.008 (ref 1.005–1.035)
Squam Epithel, UA: <1 /HPF (ref 0–5)
Urobilinogen, UA: <2 mg/dL (ref 0.2–1.9)
Urobilinogen, UA: <2 mg/dL (ref 0.2–1.9)
WBC, UA: 4 /HPF (ref 0–5)
WBC, UA: 4 /HPF (ref 0–5)
pH, UA: 6 (ref 5.0–8.0)
pH, UA: 6 (ref 5.0–8.0)

## 2014-05-04 LAB — RENAL FUNCTION PANEL W/EGFR
Albumin: 3.1 g/dL (ref 3.5–5.7)
Albumin: 3 g/dL (ref 3.5–5.7)
Albumin: 3.1 g/dL (ref 3.5–5.7)
Albumin: 3.1 g/dL (ref 3.5–5.7)
Anion Gap: 11 mmol/L (ref 3–16)
Anion Gap: 10 mmol/L (ref 3–16)
Anion Gap: 7 mmol/L (ref 3–16)
Anion Gap: 9 mmol/L (ref 3–16)
BUN: 93 mg/dL (ref 7–25)
BUN: 95 mg/dL (ref 7–25)
BUN: 95 mg/dL (ref 7–25)
BUN: 93 mg/dL (ref 7–25)
CO2: 24 mmol/L (ref 21–33)
CO2: 24 mmol/L (ref 21–33)
CO2: 27 mmol/L (ref 21–33)
CO2: 27 mmol/L (ref 21–33)
Calcium: 10.2 mg/dL (ref 8.6–10.3)
Calcium: 10.1 mg/dL (ref 8.6–10.3)
Calcium: 10.4 mg/dL (ref 8.6–10.3)
Calcium: 10 mg/dL (ref 8.6–10.3)
Chloride: 98 mmol/L (ref 98–110)
Chloride: 98 mmol/L (ref 98–110)
Chloride: 99 mmol/L (ref 98–110)
Chloride: 100 mmol/L (ref 98–110)
Creatinine: 4.23 mg/dL (ref 0.60–1.30)
Creatinine: 4.49 mg/dL (ref 0.60–1.30)
Creatinine: 4.6 mg/dL (ref 0.60–1.30)
Creatinine: 4.53 mg/dL (ref 0.60–1.30)
GFR MDRD Af Amer: 13 See note.
GFR MDRD Af Amer: 12 See note.
GFR MDRD Af Amer: 12 See note.
GFR MDRD Af Amer: 12 See note.
GFR MDRD Non Af Amer: 11 See note.
GFR MDRD Non Af Amer: 10 See note.
GFR MDRD Non Af Amer: 10 See note.
GFR MDRD Non Af Amer: 10 See note.
Glucose: 89 mg/dL (ref 70–100)
Glucose: 183 mg/dL (ref 70–100)
Glucose: 111 mg/dL (ref 70–100)
Glucose: 42 mg/dL (ref 70–100)
Osmolality, Calculated: 304 mOsm/kg (ref 278–305)
Osmolality, Calculated: 308 mOsm/kg (ref 278–305)
Osmolality, Calculated: 306 mOsm/kg (ref 278–305)
Osmolality, Calculated: 308 mOsm/kg (ref 278–305)
Phosphorus: 6.2 mg/dL (ref 2.1–4.7)
Phosphorus: 5.9 mg/dL (ref 2.1–4.7)
Phosphorus: 6.2 mg/dL (ref 2.1–4.7)
Phosphorus: 6.6 mg/dL (ref 2.1–4.7)
Potassium: 6.5 mmol/L (ref 3.5–5.3)
Potassium: 6 mmol/L (ref 3.5–5.3)
Potassium: 5.3 mmol/L (ref 3.5–5.3)
Potassium: 4.7 mmol/L (ref 3.5–5.3)
Sodium: 133 mmol/L (ref 133–146)
Sodium: 132 mmol/L (ref 133–146)
Sodium: 133 mmol/L (ref 133–146)
Sodium: 136 mmol/L (ref 133–146)

## 2014-05-04 LAB — POC GLU MONITORING DEVICE
POC Glucose Monitoring Device: 100 mg/dL (ref 70–100)
POC Glucose Monitoring Device: 199 mg/dL (ref 70–100)

## 2014-05-04 LAB — HEPATITIS C ANTIBODY
HCV Ab: REACTIVE
HCV Ab: REACTIVE
HCVAB Number: 11.85 S/CO (ref 0.00–0.79)
HCVAB Number: 11.46 S/CO (ref 0.00–0.79)

## 2014-05-04 LAB — CBC
Hematocrit: 29.7 % — ABNORMAL LOW (ref 35.0–45.0)
Hematocrit: 29 % (ref 35.0–45.0)
Hemoglobin: 9.5 g/dL — ABNORMAL LOW (ref 11.7–15.5)
Hemoglobin: 9 g/dL (ref 11.7–15.5)
MCH: 25.1 pg — ABNORMAL LOW (ref 27.0–33.0)
MCH: 24.9 pg (ref 27.0–33.0)
MCHC: 31.9 g/dL — ABNORMAL LOW (ref 32.0–36.0)
MCHC: 31.2 g/dL (ref 32.0–36.0)
MCV: 78.8 fL — ABNORMAL LOW (ref 80.0–100.0)
MCV: 79.9 fL (ref 80.0–100.0)
MPV: 8.5 fL (ref 7.5–11.5)
MPV: 8.4 fL (ref 7.5–11.5)
Platelets: 244 10E3/uL (ref 140–400)
Platelets: 217 10*3/uL (ref 140–400)
RBC: 3.77 10E6/uL — ABNORMAL LOW (ref 3.80–5.10)
RBC: 3.63 10*6/uL (ref 3.80–5.10)
RDW: 18.2 % — ABNORMAL HIGH (ref 11.0–15.0)
RDW: 18.2 % (ref 11.0–15.0)
WBC: 10.1 10E3/uL (ref 3.8–10.8)
WBC: 9.2 10*3/uL (ref 3.8–10.8)

## 2014-05-04 LAB — POTASSIUM, URINE, RANDOM
Potassium Urine Random: 18.2 mmol/L
Potassium Urine Random: 11 mmol/L

## 2014-05-04 LAB — DIFFERENTIAL
Basophils Absolute: 10 /uL (ref 0–200)
Basophils Relative: 0.1 % (ref 0.0–1.0)
Eosinophils Absolute: 20 /uL (ref 15–500)
Eosinophils Relative: 0.2 % (ref 0.0–8.0)
Lymphocytes Absolute: 1000 /uL (ref 850–3900)
Lymphocytes Relative: 9.9 % (ref 15.0–45.0)
Monocytes Absolute: 1263 /uL (ref 200–950)
Monocytes Relative: 12.5 % (ref 0.0–12.0)
Neutrophils Absolute: 7807 /uL (ref 1500–7800)
Neutrophils Relative: 77.3 % (ref 40.0–80.0)

## 2014-05-04 LAB — HAPTOGLOBIN: Haptoglobin: 58 mg/dL (ref 44–215)

## 2014-05-04 LAB — VENOUS BLOOD GAS, LINE/SYRINGE
%HBO2-Line Draw: 66.1 % (ref 40.0–70.0)
Base Excess-Line Draw: 0 mmol/L (ref <=3.0)
CO2 Content-Line Draw: 27 mmol/L (ref 25–29)
Carboxyhgb-Line Draw: 2.6 %
HCO3-Line Draw: 26 mmol/L (ref 24–28)
Methemoglobin-Line Draw: 1.7 % — ABNORMAL HIGH (ref 0.0–1.5)
PCO2-Line Draw: 46 mmHg (ref 41–51)
PH-Line Draw: 7.35 (ref 7.32–7.42)
PO2-Line Draw: 44 mmHg — ABNORMAL HIGH (ref 25–40)
Reduced Hemoglobin-Line Draw: 29.6 % — ABNORMAL HIGH (ref 0.0–5.0)

## 2014-05-04 LAB — CMV IGG ANTIBODY
CMV IGG NUM: 10 U/mL — ABNORMAL HIGH (ref 0.00–0.59)
CMV IGG NUM: 10 U/mL (ref 0.00–0.59)
CMV IgG: POSITIVE — CR
CMV IgG: POSITIVE

## 2014-05-04 LAB — BLOOD CULTURE-PERIPHERAL: Culture Result: NO GROWTH

## 2014-05-04 LAB — CHLORIDE, URINE, RANDOM
Chloride, Ur: 110 mmol/L
Chloride, Ur: 120 mmol/L

## 2014-05-04 LAB — HEPATITIS A IGM
Hep A IgM: NONREACTIVE
Hep A IgM: NONREACTIVE

## 2014-05-04 LAB — AMMONIA: Ammonia: 54 ug/dL (ref 27–90)

## 2014-05-04 LAB — TROPONIN I: Troponin I: 0.74 ng/mL — CR (ref 0.00–0.03)

## 2014-05-04 LAB — HEPATITIS C RNA, QUALITATIVE PCR: Hepatitis C RNA-PCR: DETECTED

## 2014-05-04 LAB — MAGNESIUM
Magnesium: 3.5 mg/dL (ref 1.5–2.5)
Magnesium: 3.4 mg/dL — ABNORMAL HIGH (ref 1.5–2.5)
Magnesium: 3.3 mg/dL (ref 1.5–2.5)

## 2014-05-04 LAB — HEPATIC FUNCTION PANEL
ALT: 47 U/L (ref 7–52)
AST: 69 U/L — ABNORMAL HIGH (ref 13–39)
Albumin: 3.1 g/dL — ABNORMAL LOW (ref 3.5–5.7)
Alkaline Phosphatase: 73 U/L (ref 36–125)
Bilirubin, Direct: 3.1 mg/dL — ABNORMAL HIGH (ref 0.00–0.40)
Bilirubin, Indirect: 1.3 mg/dL — ABNORMAL HIGH (ref 0.00–1.10)
Total Bilirubin: 4.4 mg/dL — ABNORMAL HIGH (ref 0.0–1.5)
Total Protein: 7.1 g/dL (ref 6.4–8.9)

## 2014-05-04 LAB — SODIUM, URINE, RANDOM
Sodium, Ur: 110 mmol/L
Sodium, Ur: 118 mmol/L

## 2014-05-04 LAB — LACTATE DEHYDROGENASE: LD: 369 U/L — ABNORMAL HIGH (ref 110–270)

## 2014-05-04 LAB — SLIDE REQUEST (MAKE SLIDE AND HOLD)

## 2014-05-04 LAB — HEPATITIS B CORE IGM
Hep B Core IgM: NONREACTIVE
Hep B Core IgM: NONREACTIVE

## 2014-05-04 LAB — CMV IGM ANTIBODY
CMV IGM NUM: 8 AU/mL (ref 0.00–29.99)
CMV IGM NUM: 8 AU/mL (ref 0.00–29.99)
CMV IgM: NEGATIVE
CMV IgM: NEGATIVE

## 2014-05-04 LAB — LACTIC ACID: Lactate: 1 mmol/L (ref 0.5–2.2)

## 2014-05-04 LAB — HEPATITIS B SURFACE ANTIGEN
Hep B Surface Ag: NONREACTIVE
Hep B Surface Ag: NONREACTIVE

## 2014-05-04 LAB — CREATININE, URINE, RANDOM: Creatinine, Urine: 32.1 mg/dL

## 2014-05-04 LAB — IRON STUDIES
% Iron Saturation: 4.5 % — ABNORMAL LOW (ref 15.0–55.0)
Iron: 17 ug/dL — ABNORMAL LOW (ref 50–212)
TIBC: 377 ug/dL (ref 265–497)

## 2014-05-04 LAB — CERULOPLASMIN: Ceruloplasmin: 354 mg/L (ref 180.0–580.0)

## 2014-05-04 LAB — URINE CULTURE: Culture Result: NO GROWTH

## 2014-05-04 LAB — FERRITIN: Ferritin: 42.4 ng/mL (ref 11.0–306.8)

## 2014-05-04 LAB — PROTIME-INR
INR: 1.8 — ABNORMAL HIGH (ref 0.9–1.1)
Protime: 20.1 s — ABNORMAL HIGH (ref 11.6–14.4)

## 2014-05-04 LAB — EOSINOPHIL, URINE: Eosinophil, Ur: NONE SEEN

## 2014-05-04 LAB — ACETAMINOPHEN LEVEL: Acetaminophen Level: <10 ug/mL (ref 10–30)

## 2014-05-04 LAB — NTPROBNP: NT Pro BNP: 29541 pg/mL — ABNORMAL HIGH (ref 15–125)

## 2014-05-04 LAB — URIC ACID: Uric Acid: 12.2 mg/dL (ref 3.8–8.7)

## 2014-05-04 LAB — BLOOD CULTURE LINE DRAW: Culture Result: NO GROWTH

## 2014-05-04 MED ORDER — furosemide (LASIX) 1 mg/mL in sodium chloride 0.9 % 100 mL infusion
INTRAVENOUS | Status: AC
Start: 2014-05-04 — End: 2014-05-04
  Administered 2014-05-04: 16:00:00 1 mg/h via INTRAVENOUS

## 2014-05-04 MED ORDER — aspirin chewable tablet 81 mg
81 | Freq: Every day | ORAL | Status: AC
Start: 2014-05-04 — End: 2014-05-11
  Administered 2014-05-04 – 2014-05-11 (×7): 81 mg via ORAL

## 2014-05-04 MED ORDER — dextrose 5% in water (D5W) infusion
INTRAVENOUS | Status: AC
Start: 2014-05-04 — End: 2014-05-04
  Administered 2014-05-04: 19:00:00 50 mL/h via INTRAVENOUS

## 2014-05-04 MED ORDER — dextrose 50 % in water (D50W) iv Syrg 50 mL
Freq: Once | INTRAVENOUS | Status: AC
Start: 2014-05-04 — End: 2014-05-04
  Administered 2014-05-04: 07:00:00 50 mL via INTRAVENOUS

## 2014-05-04 MED ORDER — furosemide (LASIX) 200 mg in sodium chloride 0.9 % IVPB
Freq: Once | INTRAVENOUS | Status: AC
Start: 2014-05-04 — End: 2014-05-04
  Administered 2014-05-04: 08:00:00 200 mg via INTRAVENOUS

## 2014-05-04 MED ORDER — lidocaine (XYLOCAINE) 20 mg/mL (2 %) injection
20 | INTRAMUSCULAR | Status: AC
Start: 2014-05-04 — End: 2014-05-05

## 2014-05-04 MED ORDER — ceFEPIme (MAXIPIME) in dextrose,iso-osm 100 mL IVPB 2 g
2 | INTRAVENOUS | Status: AC
Start: 2014-05-04 — End: 2014-05-08
  Administered 2014-05-04 – 2014-05-07 (×4): 2 g via INTRAVENOUS

## 2014-05-04 MED ORDER — famotidine (PF) (PEPCID) injection 20 mg
20 | Freq: Every day | INTRAVENOUS | Status: AC
Start: 2014-05-04 — End: 2014-05-07
  Administered 2014-05-05 – 2014-05-07 (×3): 20 mg via INTRAVENOUS

## 2014-05-04 MED ORDER — calcium gluconate 2 g in sodium chloride 0.9 % 100 mL IVPB
Freq: Once | INTRAVENOUS | Status: AC
Start: 2014-05-04 — End: 2014-05-04
  Administered 2014-05-04: 07:00:00 2 g via INTRAVENOUS

## 2014-05-04 MED ORDER — furosemide (LASIX) injection 80 mg
10 | Freq: Once | INTRAMUSCULAR | Status: AC
Start: 2014-05-04 — End: 2014-05-04
  Administered 2014-05-04: 16:00:00 80 mg via INTRAVENOUS

## 2014-05-04 MED ORDER — atorvastatin (LIPITOR) tablet 10 mg
10 | Freq: Every evening | ORAL | Status: AC
Start: 2014-05-04 — End: 2014-05-11
  Administered 2014-05-04 – 2014-05-11 (×5): 10 mg via ORAL

## 2014-05-04 MED ORDER — furosemide (LASIX) injection 60 mg
10 | Freq: Once | INTRAMUSCULAR | Status: AC
Start: 2014-05-04 — End: 2014-05-04

## 2014-05-04 MED ORDER — lactulose Soln 10 g
10 | Freq: Three times a day (TID) | ORAL | Status: AC
Start: 2014-05-04 — End: 2014-05-06
  Administered 2014-05-04 – 2014-05-06 (×5): 10 g via ORAL

## 2014-05-04 MED ORDER — rifaximin (XIFAXAN) tablet
550 | Freq: Two times a day (BID) | ORAL | Status: AC
Start: 2014-05-04 — End: 2014-05-06
  Administered 2014-05-04 – 2014-05-06 (×5): 550 mg via ORAL

## 2014-05-04 MED ORDER — metoprolol tartrate (LOPRESSOR) tablet 25 mg
25 | Freq: Two times a day (BID) | ORAL | Status: AC
Start: 2014-05-04 — End: 2014-05-04

## 2014-05-04 MED ORDER — furosemide (LASIX) injection 60 mg
10 | Freq: Once | INTRAMUSCULAR | Status: AC
Start: 2014-05-04 — End: 2014-05-04
  Administered 2014-05-04: 06:00:00 60 mg via INTRAVENOUS

## 2014-05-04 MED ORDER — heparin (porcine) injection 5,000 Units
5000 | Freq: Three times a day (TID) | INTRAMUSCULAR | Status: AC
Start: 2014-05-04 — End: 2014-05-11
  Administered 2014-05-04 – 2014-05-11 (×20): 5000 [IU] via SUBCUTANEOUS

## 2014-05-04 MED ORDER — insulin regular (HumuLIN R) injection 10 Units
100 | Freq: Once | INTRAMUSCULAR | Status: AC
Start: 2014-05-04 — End: 2014-05-04
  Administered 2014-05-04: 07:00:00 10 [IU] via SUBCUTANEOUS

## 2014-05-04 MED ORDER — DOBUTamine in D5W (DOBUTREX) 1,000 mg/250 mL (4,000 mcg/mL) infusion
1000 | INTRAVENOUS | Status: AC
Start: 2014-05-04 — End: 2014-05-05
  Administered 2014-05-04: 15:00:00 2.5 ug/kg/min via INTRAVENOUS

## 2014-05-04 MED ORDER — vancomycin 1000 mg/D5W 200 mL IV PGBK
1 | Freq: Two times a day (BID) | INTRAVENOUS | Status: AC
Start: 2014-05-04 — End: 2014-05-04

## 2014-05-04 MED ORDER — chlorothiazide (DIURIL) 500 mg in dextrose 5% in water (D5W) 50 mL IVPB
500 | Freq: Once | INTRAVENOUS | Status: AC
Start: 2014-05-04 — End: 2014-05-04

## 2014-05-04 MED ORDER — phytonadione (vitamin K1) (AQUA-MEPHYTON) 10 mg in dextrose 5% in water (D5W) 50 mL IVPB
Freq: Every day | INTRAVENOUS | Status: AC
Start: 2014-05-04 — End: 2014-05-04
  Administered 2014-05-04: 16:00:00 10 mg via INTRAVENOUS

## 2014-05-04 MED ORDER — dextrose50inwaterD50WivSyrg
INTRAVENOUS | Status: AC
Start: 2014-05-04 — End: 2014-05-04
  Administered 2014-05-04: 19:00:00 50 via INTRAVENOUS

## 2014-05-04 MED ORDER — vancomycin 1000 mg/D5W 200 mL IV PGBK
1 | Freq: Once | INTRAVENOUS | Status: AC
Start: 2014-05-04 — End: 2014-05-04
  Administered 2014-05-04: 23:00:00 1000 mg/kg via INTRAVENOUS

## 2014-05-04 MED ORDER — phytonadione (vitamin K1) (AQUA-MEPHYTON) 5 mg in dextrose 5% in water (D5W) 50 mL IVPB
10 | Freq: Every day | INTRAMUSCULAR | Status: AC
Start: 2014-05-04 — End: 2014-05-06
  Administered 2014-05-05 – 2014-05-06 (×2): 5 mg via INTRAVENOUS

## 2014-05-04 MED ORDER — sodium polystyrene (KAYEXALATE) 15 gram/60 mL suspension 15 g
15 | Freq: Once | ORAL | Status: AC
Start: 2014-05-04 — End: 2014-05-04
  Administered 2014-05-04: 07:00:00 15 g via ORAL

## 2014-05-04 MED ORDER — dextrose 50 % in water (D50W) iv Syrg 50 mL
Freq: Once | INTRAVENOUS | Status: AC
Start: 2014-05-04 — End: 2014-05-04

## 2014-05-04 MED ORDER — dextrose 50 % in water (D50W) iv Syrg
INTRAVENOUS | Status: AC
Start: 2014-05-04 — End: 2014-05-05

## 2014-05-04 MED ORDER — gelatin adsorbable (GELFOAM 12 X 7) sponge 1 each
12-7 | Freq: Once | TOPICAL | Status: AC
Start: 2014-05-04 — End: 2014-05-04

## 2014-05-04 MED ORDER — norepinephrine (LEVOPHED) 0.064 mg/mL in sodium chloride 0.9 % 250 mL infusion
INTRAVENOUS | Status: AC
Start: 2014-05-04 — End: 2014-05-06
  Administered 2014-05-04: 11:00:00 5 ug/min via INTRAVENOUS
  Administered 2014-05-05: 21:00:00 8 ug/min via INTRAVENOUS

## 2014-05-04 MED ORDER — sodium polystyrene (KAYEXALATE) 15 gram/60 mL suspension 15 g
15 | Freq: Once | ORAL | Status: AC
Start: 2014-05-04 — End: 2014-05-04
  Administered 2014-05-04: 08:00:00 15 g via ORAL

## 2014-05-04 MED ORDER — fentaNYL (SUBLIMAZE) injection 100 mcg
50 | Freq: Once | INTRAMUSCULAR | Status: AC
Start: 2014-05-04 — End: 2014-05-04
  Administered 2014-05-04: 19:00:00 100 ug via INTRAVENOUS

## 2014-05-04 MED FILL — LACTULOSE 10 GRAM/15 ML (15 ML) ORAL SOLUTION: 10 10 gram/15 mL (15 mL) | ORAL | Qty: 15

## 2014-05-04 MED FILL — PROPOFOL INFUSION 10 MG/ML: 10 10 mg/mL | INTRAVENOUS | Qty: 100

## 2014-05-04 MED FILL — DEXTROSE 50 % IN WATER (D50W) INTRAVENOUS SYRINGE: INTRAVENOUS | Qty: 50

## 2014-05-04 MED FILL — SPS (WITH SORBITOL) 15 GRAM-20 GRAM/60 ML ORAL SUSPENSION: 15-20 15-20 gram/60 mL | ORAL | Qty: 60

## 2014-05-04 MED FILL — SENNA-S 8.6 MG-50 MG TABLET: 8.6-50 8.6-50 mg | ORAL | Qty: 1

## 2014-05-04 MED FILL — FUROSEMIDE 10 MG/ML INJECTION SOLUTION: 10 10 mg/mL | INTRAMUSCULAR | Qty: 10

## 2014-05-04 MED FILL — XIFAXAN 550 MG TABLET: 550 550 mg | ORAL | Qty: 1

## 2014-05-04 MED FILL — FUROSEMIDE 10 MG/ML INJECTION SOLUTION: 10 10 mg/mL | INTRAMUSCULAR | Qty: 6

## 2014-05-04 MED FILL — HEPARIN (PORCINE) 5,000 UNIT/ML INJECTION SOLUTION: 5000 5,000 unit/mL | INTRAMUSCULAR | Qty: 1

## 2014-05-04 MED FILL — FAMOTIDINE 20 MG TABLET: 20 20 MG | ORAL | Qty: 1

## 2014-05-04 MED FILL — ATORVASTATIN 10 MG TABLET: 10 10 MG | ORAL | Qty: 1

## 2014-05-04 MED FILL — DEXTROSE 50 % IN WATER (D50W) INTRAVENOUS SYRINGE: 50.00 50.00 mL | INTRAVENOUS | Qty: 50

## 2014-05-04 MED FILL — ASPIRIN 81 MG CHEWABLE TABLET: 81 81 MG | ORAL | Qty: 1

## 2014-05-04 MED FILL — FUROSEMIDE 10 MG/ML INJECTION SOLUTION: 10 10 mg/mL | INTRAMUSCULAR | Qty: 20

## 2014-05-04 MED FILL — METOPROLOL TARTRATE 25 MG TABLET: 25 25 MG | ORAL | Qty: 1

## 2014-05-04 MED FILL — DOBUTAMINE 1,000 MG/250 ML(4,000 MCG/ML) IN 5 % DEXTROSE IV: 1000 1,000 mg/250 mL (4,000 mcg/mL) | INTRAVENOUS | Qty: 250

## 2014-05-04 MED FILL — FENTANYL (PF) 50 MCG/ML INJECTION SOLUTION: 50 50 mcg/mL | INTRAMUSCULAR | Qty: 40

## 2014-05-04 MED FILL — CALCIUM GLUCONATE 100 MG/ML (10 %) INTRAVENOUS SOLUTION: 100 100 mg/mL (10%) | INTRAVENOUS | Qty: 2

## 2014-05-04 MED FILL — VANCOMYCIN 1 GRAM/200 ML IN DEXTROSE 5 % INTRAVENOUS PIGGYBACK: 1 1 gram/200 mL | INTRAVENOUS | Qty: 200

## 2014-05-04 MED FILL — LEVOPHED 1 MG/ML INTRAVENOUS SOLUTION: 1 1 mg/mL | INTRAVENOUS | Qty: 16

## 2014-05-04 MED FILL — FUROSEMIDE 10 MG/ML INJECTION SOLUTION: 10 10 mg/mL | INTRAMUSCULAR | Qty: 8

## 2014-05-04 MED FILL — CEFEPIME 2 GRAM/100 ML IN DEXTROSE (ISO-OSMOTIC) INTRAVENOUS PIGGYBACK: 2 2 gram/100 mL | INTRAVENOUS | Qty: 100

## 2014-05-04 MED FILL — LIDOCAINE HCL 20 MG/ML (2 %) INJECTION SOLUTION: 20 20 mg/mL (2 %) | INTRAMUSCULAR | Qty: 20

## 2014-05-04 MED FILL — VITAMIN K1 10 MG/ML INJECTION SOLUTION: 10 10 mg/mL | INTRAMUSCULAR | Qty: 1

## 2014-05-04 NOTE — Unmapped (Signed)
Medical ICU H&P    05/04/2014 1:26 AM Patient: Gabrielle Rivera LOS: 1 days   Attending: Margaretha Sheffield, DO      History of Present Illness     Patient is a 64 y.o. female presents with PMH of HFrEF EF 15-20%, CAD s/p DES, CVA, HTN, HLD and stage III CKD transferred from [facility] with concerns for fulminant hepatic failure. The patient was admitted on January 1st for nausea, vomiting and altered mental status. The patient apparently had asterixis although normal ammonia, worsening INR and bilirubinemia. The patient also had a AKI on CKD with initial Scr 2.78 and BUN of 57 which increased to 4.7 and 93 respectively. Acetaminophen levels were normal. The treating team there was concerned for possible fulminant hepatitis. UC GI team was contacted and patient was transferred for further evaluation. The patient also had NSTEMI with initial troponin of 2.    The patient was given kayexalate, sodium bicarb, insulin and calcium gluconate for the hyperkalemia at [facility]. The patient had progressing encephalopathy and tachypnea and was intubated to protect her airway.  Nephrology did evaluate the patient. 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 However, it does not appear the patient was ever adequately challenged with diuresis or albumin. The patient did receive fluids while inpatient.     The patient was eventually managed in the ICU, intubated and transferred to [facility] for further evaluation. The patient had a CT head which was negative. CXR showed cardiomegaly. CT chest showed enlarged RA/RV and enlarged pulmonary vascular. NM-galbladder showed normal liver architecture with some decrease function and gallbladder ejection of 21%. US liver was normal but kidney's had increase echogenicity. TTE showed EF 15-20% with global hypokinesias and RVSP 60 mmHg.      Upon arrival, the patient was intubated and sedated. Family was present. According to the family, the patient had been using acetaminophen  500 mg up to two tablets every 4-6 hours four the past four weeks. The patient then started having abdominal pain, nausea and vomiting the past four to five days. The family states she also had decrease urine output during this time. The patient started to become anxious, agitated and had insomnia. The family states she was just not her self. The patient has no prior history of liver disease. No other recent changes in medications. 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 The patient does have history of acute hepatitis in June 2015 with AST at 3500 and ALT at 600. No further workup was done at that time.     Past Medical  History     Past Medical History   Diagnosis Date   ??? CHF (congestive heart failure)    ??? Asthma    ??? Hypertension    ??? Hyperlipemia    ??? Stroke 2014   ??? Hepatitis C         Past Surgical History     LHC with DES in 2013  AICD     Home Medications     No prescriptions prior to admission       Scheduled Meds:  ??? aspirin  81 mg Oral Daily with breakfast   ??? atorvastatin  10 mg Oral Nightly (2100)   ??? calcium gluconate IVPB (up to 5 grams)  2 g Intravenous Once   ??? dextrose 50 % in water (D50W)  50 mL Intravenous Once   ???  famotidine  20 mg Oral BID   ??? flu vaccine (seasonal < 65 yr)(PF) IM 0.5 mL  0.5 mL Intramuscular During Hospitalization   ??? insulin regular  10 Units Subcutaneous Once   ??? metoprolol tartrate  25 mg Oral BID   ??? senna-docusate  1 tablet Oral BID     Continuous Infusions:  ??? fentanyl (SUBLIMAZE) infusion standard concentration 125 mcg/hr (05/04/14 0025)   ??? propofol 20 mcg/kg/min (05/04/14 0004)     PRN medications: ondansetron     Allergies     No Known Allergies     Social History     History   Substance Use Topics   ??? Smoking status: Current Some Day Smoker     Types: Cigarettes   ??? Smokeless tobacco: Never Used   ??? Alcohol Use: No       Living situation: lives with sone   Tobacco:  1/2 pack  per day   Alcohol: denies use   Illicit Drugs: denies recent use    Family History     No family history on file.     Review of Systems     Significant as listed in HPI otherwise  Unable to obtain     Physical Exam     VITALS   Temp:  [98 ??F (36.7 ??C)] 98 ??F (36.7 ??C)  Heart Rate:  [104-108] 108  Resp:  [23-26] 26  BP: (110-116)/(72-75) 116/75 mmHg  FiO2:  [40 %-41 %] 40 %  Patient Vitals for the past 4 hrs:   BP Temp Temp src Pulse Resp SpO2 Height Weight   05/04/14 0000 116/75 mmHg - - 108 26 100 % - -   05/03/14 2330 110/72 mmHg 98 ??F (36.7 ??C) Oral 104 23 100 % 5' 4 (1.626 m) 132 lb 4.4 oz (60 kg)   05/03/14 2311 - - - - - 96 % - -     Wt Readings from Last 3 Encounters:   05/03/14 132 lb 4.4 oz (60 kg)     Estimated body mass index is 22.69 kg/(m^2) as calculated from the following:    Height as of this encounter: 5' 4 (1.626 m).    Weight as of this encounter: 132 lb 4.4 oz (60 kg).      No intake or output data in the 24 hours ending 05/04/14 0126    PHYSICAL EXAM  General: sedated, intubated  Neurologic: non-focal  HEENT: NC, AT, PERRL, EOMI, mmm, anicteric  Pulmonary: coarse bilateral  Cardiovascular: tacycardia, systolic mumur with peristernal heave, JVP present  Abdominal: soft, NT/ND no HSM or masses  Extremities: no ,c,c,e; radial and pedal pulses +2  Other: foley catheter in place        Laboratory Values & Imaging     Recent Labs      05/03/14   2359   WBC  10.1   HGB  9.5*   HCT  29.7*   MCV  78.8*   PLT  244     Recent Labs      05/03/14   2359   NA  133   K  6.5*   CL  98   CO2  24   BUN  93*   CREATININE  4.23*   GLUCOSE  89   CALCIUM  10.2   MG  3.5*   PHOS  6.2*   ALBUMIN  3.1*   3.1*     estimated creatinine clearance is 12.1 mL/min (based on Cr of 4.23).  Recent Labs      05/03/14   2359   INR  1.8*   PROTIME  20.1*       Recent Labs      05/03/14   2359   ALT  47   AST  69*   ALKPHOS  73   BILITOT  4.4*       No results for input(s): COLORU, CLARITYU, PH, PROTEINUA, PHUR, LABSPEC,  GLUCOSEU, BLOODU, LEUKOCYTESUR, NITRITE, BILIRUBINUR, UROBILINOGEN, RBCUA, WBCUA, BACTERIA, AMORPHOUS, CRYSTAL, CASTS in the last 72 hours.    Invalid input(s): KEYTONESU  No results for input(s): NAUR, KUR, CLUR in the last 72 hours.    Invalid input(s): CO2UR, CRUR   No results for input(s): CKTOTAL, CKMB, CKMBINDEX, TROPONINI in the last 72 hours.    No results found for: HGBA1C  Lab Results   Component Value Date    CREATININE 4.23* 05/03/2014      No results found for: MICROALBUR, MALB24HUR  No results found for: CHOLTOT, LDL, HDL, TRIG      No results found for: IRON, TIBC, FERRITIN  Lab Results   Component Value Date    CALCIUM 10.2 05/03/2014    PHOS 6.2* 05/03/2014      No results found for: VITD25H    Recent Labs      05/03/14   2359   LACTATE  1.0       MICROBIOLOGY        Assessment & Plan:     Mrs Beadles is a 16 F with PMH of HFrEF with EF 15-20% admitted with acute hypoxic respiratory failure, AKI on CKD and concerns for acute liver failure.     Neurologic  Sedation:   -Propofol gtt  -Fentanyl gtt     Encephalopathy: Hepatic vs uremic. Ammonia normal; however, not always reliable. BUN had doubled over past three days. CT head was negative at OSH.   -Start rifaximin and lactulose   -Check ammonia  -Consult nephrology for possible dialysis  -Vas-cath for possible CRRT     Pulmonary  Acute hypoxic respiratory failure: The patient was intubated to protect her airway given encephalopathy.   -intubated, continue sedation  -daily PSV  -Repeat CXR    Cardiovascular  HFrEF: Ef 15-20%. Suspect acute heart failure exacerbation with resultant congestive hepatopathy and acute kidney injury. Pro-BNP elevated at [address]000 at [facility]. Para-sternal heave present along with JVP. Bedside US showed dilated LA, LV with global hypokinesis.   -Continue BB; holding ACEI; may need to add imdur/hydralazine for afterload reduction   -Diuresis, may need inotropic support will consider milirone   -Daily weights, strict I&Os    NSTEMI; type  II: Troponin initially 2 at [facility]. Was not started on heparin gtt. EKG showed no ST elevations.   -trend troponin  -EKG    Sinus tachycardia:   -EKG    Hyperlipidemia: continue atorvastatin    CAD/CVA: continue aspirin and statin     Gastrointestinal  Acute liver failure: the patient is having worsening liver function with increase INR and DB. Liver enzymes are WNL. The patient has been exposed to high doses of acetaminophen over past month. Acetaminophen level at [facility] < 10. She did not receive any NAC. Because of the worsening INR, DB, encephalopathy and AKI on CKD, there was concern for acute liver failure and patient was transferred to [facility] for evaluation by the liver team. The patient does have history of hep C. She did have one episode of acute hepatitis in June 2015  which AST in 3000s and ALT in 600s. At that time, a thorough work up was not performed. Abdominal US showed normal biliary tree and liver architecture. Discuss with liver team who will see the patient in the morning.   -Check viral hepatitis panel  -Ceruloplasm  -Acetaminophen level   -INR and hepatic panel   -Consult liver team     Chronic acalculous cholecystitis: HIDA scan showed gallbladder EF at 21% and gallbladder wall thickening. No pericholecystic fluid collections.     Renal  AKI on CKD: The patient has AKI on CKD. Suspect pre-renal secondary to HFrEF exacerbation. The patient appears fluid overload. Will give a trial of lasix but may need CRRT. Worsening azotemia and hyperkalemia. No acidosis. Renal US at [facility] showed increase echogenicity.   -Lasix 200 mg IV x1  -Strict I&Os   -Avoid nephrotoxic agents  -Uric acid, urine lytes, creatine, urea, eosinophils  -Consult renal     Uremia: No rub heard. Encephalopathy present. Consider CRRT.     Hyperkalemia: Present at [facility]. She received sodium bicarb, calcium gluconate, insulin and kayexalate. K still elevated at 6.5. Will try to get down but if unresponsive will need CRRT.   -EKG, calcium gluconate    -insulin 10 units IV with amp D 50  -kayexalate     Acid/Base   No acute issues    Genito-Urinary  Foley catheter in place    Infectious Disease  Hep C: Ab positive. Check RNA and genotype.     Endocrine  Goal BG between 140-180.    Hematology/Oncology  Microcytic anemia: Hgb 9.5. No signs of active bleeding.   -Transfuse < 7  - Check ferritin, iron profile, vitamin b12, folate     Coagulopathy: INR 1.8. Secondary to liver dysfunction. Has platelet dysfunction from uremia.   -Vitamin K challenge    Psychiatry  No acute issues    Fluid/Electrolytes/Nutrition   Intravascular volume assessment: hypervolemic  IVF: no IVF  Electrolyte Abnormalities: replace lytes as needed  Nutrition:   Diet Orders          None          Prophylaxis, Code Status  DVT Prophylaxis:  heparin  GI Prophylaxis:  pepcid  Code Status: Full Code  Access: Right central line  Diet:   Diet Orders          None          Reanimation      Family Communication      Social Work      Disposition   MICU      Extended Emergency Contact Information  Primary Emergency Contact: Cazier,Jason   [place]  Home Phone: [phone]  Relation: Son    ===========================================================    Izaan Kingbird DAVID Romana Juniper, DO  Internal Medicine     [facility]  [facility] MICU  [address]  [place] [address]  Dept: [phone]  Loc: [phone]

## 2014-05-04 NOTE — Unmapped (Signed)
SEDATION NOTES  Sedation Post-op Note    Patient: Gabrielle Rivera    Procedure(s) Performed: RHC and arterial line    Anesthesia type: deep sedation for intubated patient    Patient location: ICU    Post pain: Adequate analgesia    Post assessment: no apparent anesthetic complications    Last Vitals:   Filed Vitals:    05/04/14 1400   BP: 95/57   Pulse: 84   Temp: 95.8 ??F (35.4 ??C)   Resp: 22   SpO2: 100%       Post vital signs: stable    Level of consciousness: sedated    Complications: None

## 2014-05-04 NOTE — Unmapped (Signed)
Nephrology Consult H&P    05/04/2014 2:34 AM     Patient: Gabrielle Rivera [id number]  [id number]  Date of Admit: 05/03/2014 LOS: 1 days  Referring physician: Margaretha Sheffield, DO  Consulting Physician: Dr. Olga Millers  Nephrologist: None      Date of Request: 05/04/14  Date of Response: 05/04/2014      Reason for Consult     AKI    Chief complaint: Abd pain     History of Present Illness   Gabrielle Rivera is a 64 y.o. female with a past medical history of Hep C, CHFrEF 20% s/p BiVICD, Severe MR, Paroxysmal AFib, HTN, CVA 2014 who presented 05/03/2014  As transfer from [facility] for continued evaluation of AMS/renal failure/liver dysfunction  for whom we are consulted for AKI.    Patient recently diagnosed with uterine fibroid and was taking Tylenol for pain although levels were not largely elevated on admission to [facility].    Patient presented 05/02/14 to [facility] with N/V/abd pain noted to have elevated INR 1.5 and elevated T bili to 1.10. BUN/Cr on admission was 59/2.78 with baseline ~2.0.  U/S Abd showed diffuse gallbladder thickening which couple imply cholecystitis without stones found.  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  Patient was taking Lasix 40 mg BID / Lisinopril 2.5 mg which were held on admission due to AKI; light hydration NS @ 50 ml/hr per chart.    Patient became altered and started having hallucinations and was sedated/intubated for imaging of head. CT head without acute findings.    Contrast exposure:  None  Nephrotoxic drug exposure: None  Hypotensive episodes: None    Past medical, family, and social histories were reviewed as previously documented. Updates were made as necessary.    Review of Systems (Focused)     Unable to obtain 2/2 MS    Past Medical History     Past Medical History   Diagnosis Date   ??? CHF (congestive heart failure)    ??? Asthma    ??? Hypertension    ??? Hyperlipemia    ??? Stroke 2014   ???  Hepatitis C          Past Surgical History     Unable to obtain 2/2 MS    Family History     Unable to obtain 2/2 MS    Social History     History   Substance Use Topics   ??? Smoking status: Current Some Day Smoker     Types: Cigarettes   ??? Smokeless tobacco: Never Used   ??? Alcohol Use: No        Medications       Inpatient Meds:  No prescriptions prior to admission       Scheduled Meds:  ??? aspirin  81 mg Oral Daily with breakfast   ??? atorvastatin  10 mg Oral Nightly (2100)   ??? calcium gluconate IVPB (up to 5 grams)  2 g Intravenous Once   ??? famotidine  20 mg Oral BID   ??? flu vaccine (seasonal < 65 yr)(PF) IM 0.5 mL  0.5 mL Intramuscular During Hospitalization   ??? furosemide (LASIX) injection  200 mg Intravenous Once   ??? lactulose  10 g Oral TID   ??? metoprolol tartrate  25 mg Oral BID   ??? phytonadione (AQUA-MEPHYTON) IVPB  10 mg Intravenous Daily 0900   ??? rifaximin  550 mg Oral BID   ??? senna-docusate  1 tablet Oral BID   ??? sodium polystyrene  15 g Oral Once     Continuous Infusions:  ??? fentanyl (SUBLIMAZE) infusion standard concentration 125 mcg/hr (05/04/14 0025)   ??? norepinephrine infusion Stopped (05/04/14 0219)   ??? propofol 20 mcg/kg/min (05/04/14 0004)     PRN medications: ondansetron    Allergies: No Known Allergies      Vital Signs     Temp:  [98 ??F (36.7 ??C)] 98 ??F (36.7 ??C)  Heart Rate:  [103-108] 103  Resp:  [22-26] 22  BP: (108-116)/(72-75) 108/72 mmHg  FiO2:  [40 %-41 %] 41 %  Patient Vitals for the past 4 hrs:   BP Temp Temp src Pulse Resp SpO2 Height Weight   05/04/14 0100 108/72 mmHg - - 103 22 100 % - -   05/04/14 0000 116/75 mmHg - - 108 26 100 % - -   05/03/14 2330 110/72 mmHg 98 ??F (36.7 ??C) Oral 104 23 100 % 5' 4 (1.626 m) 132 lb 4.4 oz (60 kg)   05/03/14 2311 - - - - - 96 % - -     Wt Readings from Last 3 Encounters:   05/03/14 132 lb 4.4 oz (60 kg)     Admit Wt: Weight: 132 lb 4.4 oz (60 kg)   Todays Wt: Weight: 132 lb 4.4 oz (60 kg)  Estimated body mass index is 22.69 kg/(m^2) as calculated  from the following:    Height as of this encounter: 5' 4 (1.626 m).    Weight as of this encounter: 132 lb 4.4 oz (60 kg).       No intake or output data in the 24 hours ending 05/04/14 0234           RESPIRATORY   Ventilator Setting: Patient Vitals for the past 4 hrs:   Vent Mode Resp Rate (Set) Vt (Set, mL) Inspiratory Time Set PIP Observed PEEP/CPAP   05/03/14 2311 VC-AC/PRVC 22 350 mL 0.9 sec 18 cm H2O 5 cm H20     Fi02 Requirement: No data found.    Last ABG:        No results found for: PHVEN, PCO2, PO2VEN, HCO3VEN, BEVEN, HBO2PER, O2SATVEN         Physical Exam     General Appearance: Intubated/sedated; acutely ill   HEENT:  NC/AT, EOM grossly intact, Nares patent   Neck: +JVD To mandible   CVS:    Tachycardic, heave noted   Respiratory:   Ventilated lung sounds, assisted breathing   Abdomen:   Soft, Non-Distended, Non-Tender, BSx4,    Extremities:  Extremities normal, atraumatic, no cyanosis or edema.    Skin: Skin color, texture, turgor normal, no rashes or lesions'   Neurologic:  Intubated/sedated       Laboratory Data     Recent Labs      05/03/14   2359   WBC  10.1   HGB  9.5*   HCT  29.7*   MCV  78.8*   PLT  244     Recent Labs      05/03/14   2359   NA  133   K  6.5*   CL  98   CO2  24   BUN  93*   CREATININE  4.23*   GLUCOSE  89   CALCIUM  10.2   MG  3.5*   PHOS  6.2*   ANIONGAP  11  Recent Labs      05/03/14   2359   INR  1.8*   PROTIME  20.1*       FSBS Range:   No results for input(s): POCGMD in the last 72 hours.    Recent Labs      05/03/14   2359   ALT  47   AST  69*   ALKPHOS  73   BILITOT  4.4*       Cardiac Labs:  Recent Labs      05/03/14   2359   TROPONINI  0.74*   NTPROBNP  29541*     No results for input(s): CHOLTOT, TRIG, HDL, CHOLHDL, LDL in the last 72 hours.    Invalid input(s): VLDCHOL  No results found for: HGBA1C    Nephro Labs:  No results for input(s): COLORU, CLARITYU, PH, PROTEINUA, PHUR, LABSPEC, GLUCOSEU, BLOODU, LEUKOCYTESUR, NITRITE, BILIRUBINUR, UROBILINOGEN, RBCUA,  WBCUA, BACTERIA, AMORPHOUS, CRYSTAL, CASTS in the last 72 hours.    Invalid input(s): KEYTONESU  No results for input(s): NAUR, KUR, CLUR in the last 72 hours.    Invalid input(s): CO2UR, CRUR   No results found for: Concepcion Elk  Lab Results   Component Value Date    CALCIUM 10.2 05/03/2014    PHOS 6.2* 05/03/2014      No results found for: VITD25H    Anemia Labs:  No results found for: IRON, TIBC, FERRITIN  No results found for: VITAMINB12      Sepsis Marker Labs:  Recent Labs      05/03/14   2359   LACTATE  1.0     No results found for: FIBRINOGEN   No results for input(s): TEGANGLE, TEGKTIME, TEGLYSIS30, TEGMAXAMPL, TEGRTIME, CBMZ in the last 72 hours.  No results for input(s): ESR, CRP in the last 72 hours.  No results found for: ESR, CRP      Infectious Labs  Urine cx:  No results found for: COLORU, CLARITYU, PH, PROTEINUA, PHUR, LABSPEC, GLUCOSEU, BLOODU, LEUKOCYTESUR, NITRITE, BILIRUBINUR, UROBILINOGEN, RBCUA, WBCUA, BACTERIA, AMORPHOUS, CRYSTAL, CASTS      In addition to the above an extensive amount of complex data in the patients lab and chart were reviewed.    Morbidity / complication risk is felt to be: high      Diagnostic Studies   TTE @ OSH 05/02/14  Summary:??????????????????????????????????????????????????????????????????????????????????????????????????????????????????????????????????????????????????   ??????????????????????????????????????????????????????????????????????????????????????????????????????????????????????????????????????????????????????????????????   1. Severe left ventricle systolic dysfunction with ejection fraction of 15-20%.??   2. Moderate to severe mitral regurgitation.??????????????????????????????????????????????????????????????????????????   3. Severe left ventricular diastolic dysfunction.??????????????????????????????????????????????????????????????   4. Mild concentric left ventricular hypertrophy.???????????????????????????????????????????????????????????? ??   5. Mildly enlarged right ventricle.??????????????????????????????????????????????????????????????????????????????????????????   6. Mildly reduced RV systolic function.??????????????????????????????????????????????????????????????????????????????????   7. Severely dilated left atrium.???????????????????????????????????????????????????????????????????????????????????????????? ??   8.  Moderately dilated right atrium.??????????????????????????????????????????????????????????????????????????????????????????   9. Moderately elevated pulmonary artery systolic pressure.???????????????????????????????????????? ??   10. Trivial pericardial effusion with no evidence of cardiac tamponade.???????????????? ??   ??????????????????????????????????????????????????????????????????????????????????????????????????????????????????????????????????????????????????????????????????   Left Ventricle: There is severe diastolic dysfunction: restrictive pattern. The ??   e/e' ratio by spectral Doppler is 12.02. The LV has severe reduction in ejection??   fraction, at 15-20% with global hypokinesis.????????????        Assessment & Plan     Gabrielle Rivera is a 64 y.o. female with a past medical history of Hep C, CHFrEF 20% s/p BiVICD, Severe MR, Paroxysmal AFib, HTN, CVA 2014 who presented 05/03/2014  As transfer from [facility] for continued evaluation of AMS/renal failure/liver dysfunction  for whom we  are consulted for AKI.    #.) Anuric AKI on CKD  - Baseline SCr 2.5 likely 2/2 poor perfusion CHF/HTN; R kidney ~ 9 cm at OSH suggesting chronic disease  - Unclear proteinuria prior to AKI; no records in system  - Suspect AKI etiology pre-renal to ATN on ACE vs. CRS vs. HRS  - UNa at OSH 28; suggesting pre-renal picture but unlikely HRS especially given liver not acutely decompensated  - Insults include: Lisinopril 2.5 mg at home stopped at OSH        Recommendations:   Given overall picture agree with primary that most likely etiology of multiorgan dysfunction is likely cardiac etiology forward flow  Ulytes, UA as possible  Agree with Lasix trial; if no UOP response can support with CRRT  Given anuria and difficult to manage hyperkalemia will order CRRT for slow volume removal; given developing hypotension at this point removing fluids may be difficult or improve hemodynamics; will have to monitor closely  Possible Cardiology input; afterload reduction with milrinone?      Code Status: Full Code    Thank you for allowing [place] to participate in the care of this patient.  Signed:  William Dalton,  M.D. Nephrology Fellow PGY-5   Personal Pager: [phone]  05/04/2014, 2:34 AM    Patient discussed with consult staff attending.

## 2014-05-04 NOTE — Unmapped (Signed)
Renal Consult Follow Up Note      Chief complaint on admission : concern for hepatic failure/AKI    Reason for Renal Consult : AKI       HPI:  Gabrielle Rivera is a 64 y.o. 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 As transfer from [facility] for continued evaluation of AMS/renal failure/liver dysfunction?? for whom we are consulted for AKI.    Patient recently diagnosed with uterine fibroid and was taking Tylenol for pain although levels were not largely elevated on admission to [facility].    Patient presented 05/02/14 to [facility] with N/V/abd pain noted to have elevated INR 1.5 and elevated T bili to 1.10. BUN/Cr on admission was 59/2.78 with baseline ~2.0.?? U/S Abd showed diffuse gallbladder thickening which couple imply cholecystitis without stones found.?? 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 Patient was taking Lasix 40 mg BID / Lisinopril 2.5 mg which were held on admission due to AKI; light hydration NS @ 50 ml/hr per chart.    Patient became altered and started having hallucinations and was sedated/intubated for imaging of head. CT head without acute findings.    Contrast exposure:?? None  Nephrotoxic drug exposure: None  Hypotensive episodes: None    Past medical, family, and social histories were reviewed as previously documented. Updates were made as necessary.    Interval History:   Some UO with IV diuretics.         HISTORIES.    PFSH unchanged    ROS.  Cannot obtain from intubated pt.       PHYSICAL EXAM    Temp:  [98 ??F (36.7 ??C)-98.3 ??F (36.8 ??C)] 98.3 ??F (36.8 ??C)  Heart Rate:  [91-109] 91  Resp:  [21-26] 22  BP: (84-116)/(47-75) 103/63 mmHg  FiO2:  [40 %-42 %] 42 %    Wt Readings from Last 3 Encounters:   05/04/14 132 lb 15 oz (60.3 kg)         Intake/Output Summary (Last 24 hours) at 05/04/14 1252  Last data filed at  05/04/14 1220   Gross per 24 hour   Intake  860.3 ml   Output   2110 ml   Net -1249.7 ml       Constitutional :Intuabated and sedated.   Skin :   No rash, lesions, warm to touch  HENT:  Normocephalic, external ears and nose normal  Eyes :   no injection, icterus, EOM grossly intact.  Neck :   supple, No tracheal shift, no Thy mass  Chest :  No respiratory distress, On ventilator.     clear to auscultation, BS vesicular +0  CVS :   HR regular, HS S1 S2 + 0    LE edema neg  ABD :   non-distended, soft, non-tender,   Psych :  mood and affect approtriate, normal interaction, eye contact.      No Known Allergies    MEDS     Scheduled Meds:  ??? aspirin  81 mg Oral Daily with breakfast   ??? atorvastatin  10 mg Oral Nightly (2100)   ??? famotidine  20 mg Oral BID   ??? flu vaccine (seasonal < 65 yr)(PF) IM 0.5 mL  0.5 mL Intramuscular During Hospitalization   ??? heparin (porcine)  5,000 Units Subcutaneous 3 times per day   ???  lactulose  10 g Oral TID   ??? phytonadione (AQUA-MEPHYTON) IVPB  10 mg Intravenous Daily 0900   ??? rifaximin  550 mg Oral BID   ??? senna-docusate  1 tablet Oral BID     Continuous Infusions:  ??? DOBUTamine in D5W 2.5 mcg/kg/min (05/04/14 1024)   ??? fentanyl (SUBLIMAZE) infusion standard concentration 125 mcg/hr (05/04/14 0025)   ??? furosemide (LASIX) infusion 1 mg/hr (05/04/14 1035)   ??? norepinephrine infusion 5 mcg/min (05/04/14 1206)   ??? propofol 10 mcg/kg/min (05/04/14 1223)     PRN Meds:.ondansetron      DATA :       Date 05/03/14 0700 - 05/04/14 0659 05/04/14 0700 - 05/05/14 0659   Shift 1610-9604 1500-2259 2300-0659 24 Hour Total 0700-1459 1500-2259 2300-0659 24 Hour Total   I  N  T  A  K  E   I.V.     600.3  (10)   600.3  (10)      I.V.     301   301      Volume (mL) Dobutamine     2   2      Volume (mL) Propofol      69.7   69.7      Volume (mL) Fentanyl     149   149      Volume (mL) Furosemide     1.5   1.5      Volume (mL) Norepinephrine     77.1   77.1    NG/GT     260   260      Flushes (mL) (NG/OG Tube  Orogastric Left mouth)     260   260      Intake (mL) (NG/OG Tube Orogastric Left mouth)     0   0    Shift Total  (mL/kg)     860.3  (14.3)   860.3  (14.3)   O  U  T  P  U  T   Urine   850  (1.8) 850  (0.6) 1260   1260      Output (mL) (Urethral Catheter Latex;Double-lumen)   [phone]   1260    Emesis/NG output     0   0      Drainage Output (mL) (NG/OG Tube Orogastric Left mouth)     0   0    Stool              Stool Occurrence   2 x 2 x 0 x   0 x    Shift Total  (mL/kg)   850  (14.1) 850  (14.1) 1260  (20.9)   1260  (20.9)   Weight (kg)   60.3 60.3 60.3 60.3 60.3 60.3       Lab Results   Component Value Date    CREATININE 4.60* 05/04/2014    BUN 95* 05/04/2014    NA 133 05/04/2014    K 5.3 05/04/2014    CL 99 05/04/2014    CO2 27 05/04/2014     Lab Results   Component Value Date    CALCIUM 10.4* 05/04/2014    PHOS 6.2* 05/04/2014     No results found for: [facility]  Lab Results   Component Value Date    WBC 9.2 05/04/2014    HGB 9.0* 05/04/2014    HCT 29.0* 05/04/2014    MCV 79.9* 05/04/2014    PLT 217 05/04/2014     Lab  Results   Component Value Date    IRON 17* 05/04/2014    TIBC 377 05/04/2014    FERRITIN 42.4 05/04/2014     Lab Results   Component Value Date    KETONESU Negative 05/04/2014    BLOODU Large* 05/04/2014    BILIRUBINUR Negative 05/04/2014    UROBILINOGEN <2.0 05/04/2014    PROTEINUA Negative 05/04/2014    NITRITE Negative 05/04/2014    LEUKOCYTESUR Negative 05/04/2014    PHUR 6.0 05/04/2014    LABSPEC 1.009 05/04/2014    CLARITYU Slightly-Cloudy* 05/04/2014    COLORU Yellow 05/04/2014           In addition to the above, I have reviewed an extensive amount of complex data.    Morbidity / complication risk is felt to be :  high.      Assessment/Plan:   Gabrielle Rivera is a 64 y.o. 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 As transfer from [facility] for continued evaluation of AMS/renal failure/liver dysfunction??  for whom we are consulted for AKI.    #.) Anuric AKI on CKD  - Baseline SCr 2.5 likely 2/2 poor perfusion CHF/HTN; R kidney ~ 9 cm at OSH suggesting chronic disease  - Unclear proteinuria prior to AKI; no records in system  - Suspect AKI etiology pre-renal to ATN on ACE vs. CRS vs. HRS  - UNa at OSH 28; suggesting pre-renal picture but unlikely HRS especially given liver not acutely decompensated  - Insults include: Lisinopril 2.5 mg at home stopped at OSH  - Patient had IV lasix 200 mg at 2 am preceded by diurel 500 mg. Has had some urine output. Plan to start IV lasix and follow UO. If fails, will start CRRT for fluid removal.   - Cardiology input awaited.     Concern for acute hepatic failure:  Evaluated by hepatology. Patient likely has chronic liver disease due to Hep C. Current liver dysfunction is likely due to hepatic congestion secondary to poor cardiac function.       Thank you for the consult.      [facility]  05/04/2014    Discussed with Consult staff Dr Olga Millers

## 2014-05-04 NOTE — Unmapped (Signed)
[facility] Clinical Pharmacy Service: Vancomycin Consult    Gabrielle Rivera is a 64 y.o. female currently being treated broadly for possible sepsis.    Patient has No Known Allergies.    Pharmacy consulted for vancomycin management by Avon Gully, MD.      Current regimen is:  Vancomycin 1000mg  IV x1      Objective Data     Wt Readings from Last 1 Encounters:   05/04/14 132 lb 15 oz (60.3 kg)     Ht Readings from Last 1 Encounters:   05/03/14 5' 4 (1.626 m)     Temp:  [95.8 ??F (35.4 ??C)-98.3 ??F (36.8 ??C)] 95.8 ??F (35.4 ??C)  Heart Rate:  [84-109] 84  Resp:  [21-26] 22  BP: (84-116)/(47-75) 95/57 mmHg  FiO2:  [40 %-42 %] 42 %  I/O last 3 completed shifts:  In: -   Out: 850 [Urine:850]      Lab 05/04/14  1240 05/04/14  0504 05/04/14  0245 05/03/14  2359   WBC  --  9.2  --  10.1   BUN 93* 95* 95* 93*   CREATININE 4.53* 4.60* 4.49* 4.23*         Assessment and Plan     ?? Based on patients age and renal function, recommend pulse dosing vancomycin based on levels.  If CRRT is started, can change frequency to q24h.  ?? Will check vancomycin serum concentration with am labs and dose accordingly.  ?? Goal vancomycin trough of 15-20mg /L  ?? Pharmacy will continue to monitor therapy for efficacy and toxicity.    Grace Blight, Pharm.D.  Clinical Pharmacy Specialist, Critical Care  Pager: [phone]  [phone]  On-call pager: [phone]  05/04/2014 4:38 PM

## 2014-05-04 NOTE — Unmapped (Signed)
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 She had been taking up to 6 gm of tylenol daily over the last few weeks prior to admission for pain related to a uterine fibroid.  She had worsening renal and cardiac failure throughout that admission, with mild elevation in liver tests and INR as well. She was ultimately intubated for altered mental status and transferred to [facility] overnight for ongoing care. GI being consulted given concern for acute liver failure. She had an acute rise in LFTs in June 2015 with AST 3065 and ALT 628, but they had normalized prior to this admission.  She has a positive HCV antibody, but no RNA level.     PMH:  Past Medical History   Diagnosis Date   ??? CHF (congestive heart failure)    ??? Asthma    ??? Hypertension    ??? Hyperlipemia    ??? Stroke 2014   ??? Hepatitis C        Allergies:  No Known Allergies    Medications:  Scheduled Meds:  ??? aspirin  81 mg Oral Daily with breakfast   ??? atorvastatin  10 mg Oral Nightly (2100)   ??? famotidine  20 mg Oral BID   ??? flu vaccine (seasonal < 65 yr)(PF) IM 0.5 mL  0.5 mL Intramuscular During Hospitalization   ??? furosemide (LASIX) injection  80 mg Intravenous Once   ??? lactulose  10 g Oral TID   ??? metoprolol tartrate  25 mg Oral BID   ??? phytonadione (AQUA-MEPHYTON) IVPB  10 mg Intravenous Daily 0900   ??? rifaximin  550 mg Oral BID   ??? senna-docusate  1 tablet Oral BID     Continuous Infusions:  ??? fentanyl (SUBLIMAZE) infusion standard concentration 125 mcg/hr (05/04/14 0025)   ??? furosemide (LASIX) infusion     ??? norepinephrine infusion 15 mcg/min (05/04/14 0659)   ??? propofol 10 mcg/kg/min (05/04/14 0701)     PRN Meds:.ondansetron    SHX:    reports that she has been smoking Cigarettes.  She has never used smokeless tobacco. She reports that she uses illicit drugs. She reports that she does not drink alcohol.    FHX:  family history is not on file.    ROS:  Unable to obtain secondary to patient's condition.    Filed Vitals:    05/04/14 0700   BP: 84/58   Pulse: 99   Temp:    Resp: 22   SpO2: 100%       Intake/Output Summary (Last 24 hours) at 05/04/14 0733  Last data filed at 05/04/14 0500   Gross per 24 hour   Intake      0 ml   Output    850 ml   Net   -850 ml       Physical Examination:  Intubated, sedated. NAD.  NCAT. +Scleral icterus. No conjunctival pallor.    Neck supple.    RRR. Normal s1 s2. No m/r/g.   Lungs CTA without w/r/r.  Abdomen with normoactive BS. Soft. Nontender. Nondistended. No HSM.  Extremities warm. No c/c/e.    Skin without rashes. No jaundice. No spider angiomata.    Data: reviewed.  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  Hepatology consulted given concern for acute liver failure as an underlying cause of her coagulopathy and altered mental status. The patient does not have acute liver failure.  More likely the mild rise in liver tests and elevated INR can be explained by hepatic congestion from heart failure and possible sepsis.  Recommend pan-cultures.  Can check abdominal US with doppler.  She likely has chronic HCV, but need to confirm the diagnosis with HCV RNA with reflex to genotype. Will follow.  Please call with questions.    W.Glendell Schlottman  [phone]

## 2014-05-04 NOTE — Unmapped (Signed)
Hypoglycemia Note (for blood glucose <70 mg/dL)  NOTE: For pregnant patients hypoglycemia is <60 mg/dL      Patient found to be hypoglycemic.  Time: 1400      Blood glucose: 42 mg/dL    Treatment given to patient: Dextrose 50% in water 50mL syringe  Comments:     Per hypoglycemia protocol- blood glucose will need to be rechecked in 15 minutes .  Recheck time (s):  1414 Blood glucose result(s): 100 mg/dL  Treatment given to patient (if needed): No additional treatments indicated at this time  Comments:       Patient educated on signs, symptoms, and treatment of hypoglycemia.

## 2014-05-04 NOTE — Unmapped (Signed)
Cardiology Consult    Patient: Gabrielle Rivera  MRN: [id number]  CSN: [id number]    Referring physician: Dr. Zacarias Pontes  Reason for consult: Biventricular heart failure, evaluation for cardiogenic shock    History of Present Illness     Gabrielle Rivera is a 64 y.o. female with a history of CAD s/p drug eluting stent (2013), CVA (2014), HTN, HLD, CKD, Hepatitis C, HFrEF EF 15-20% who was admitted to an OSH with nausea, vomiting, abdominal pain  and altered mental status. History obtained from Care Everywhere as patient has been intubated and sedated. She had presented to the OSH prior to admission with worsening chest pain/orthopnea. On admission she was complaining of abdominal pain and nausea vomiting, thought to be associated with uterine leiomyoma. She became increasingly encephalopathic and was ultimately intubated for airway protection. She was transferred to [facility] with concern for possible fulminant hepatitis with a worsening acute on chronic kidney injury and hyperkalemia. Upon admission at [facility] she was given 200mg  IV lasix and then placed on a lasix drip and has been net negative 2L since admission. She had a right heart catheterization this afternoon that revealed mild hypervolemia with mild to moderate pulmonary hypertension and hyperdynamic CO 7.1 and CI:4.3    Cardiac history: The patient has an extensive cardiac history per chart review.   - Progress notes mention a history of possible myocardial infarction ~2010.     - She had a stent placed ~2013    - ECHO 12/12/2012 revealed small pericardial effusion, moderate dilatation of the LV, severe LV systolic dysfunction with EF 20-25%, mild LVH, severely dilated LA, moderate to severe mitral regurgitation, myxomatous degeneration of the mitral valve    - ICD placed 05/31/2013 [facility] [employer] [place] model [id number])    - ECHO 11/09/2013 revealed small pericardial effusion, severe left ventricle systolic dysfunction with ejection fraction of 15-20%, severe dilatation of  the LV, moderate to severe mitral regurgitation, severely dilated left atrium, mild LVH    - ECHO 05/02/2014 Severe left ventricle systolic dysfunction with ejection fraction of 15-20%, Moderate to severe mitral regurgitation, mild concentric LVH, severe LV diastolic dysfunction, mildly enlarged LV, mildly reduced RV systolic function, severely dilated LA, moderately dilated RA, moderately elevated pulmonary artery systolic pressure (estimated right ventricular systolic pressure is moderately elevated at 60.8 mmHg), trivial pericardial effusion    - She has chronically elevated troponinemia ranging from 1.27 to 3.6 (peak value noted 12/17/2013)  - Chronically elevated proBNP, 34239 on admission to OSH             Review of Systems     Unable to obtain as patient is intubated and sedated.      Past Medical History     Past Medical History   Diagnosis Date   ??? CHF (congestive heart failure)    ??? Asthma    ??? Hypertension    ??? Hyperlipemia    ??? Stroke 2014   ??? Hepatitis C          Past Surgical History     No past surgical history on file. Unable to obtain as patient is sedated and intubated  - Stent placed 2013  - ICD placed 05/31/2013      Family History     No family history on file. Unable to obtain as patient is sedated and intubated.      Social History     History     Social History   ??? Marital Status: Single  Spouse Name: N/A     Number of Children: N/A   ??? Years of Education: N/A     Occupational History   ??? Not on file.     Social History Main Topics   ??? Smoking status: Current Some Day Smoker     Types: Cigarettes   ??? Smokeless tobacco: Never Used   ??? Alcohol Use: No   ??? Drug Use: Yes      Comment: cocaine, heroin   ??? Sexual Activity: Not Currently     Other Topics Concern   ??? Not on file     Social History Narrative     Per OSH records, she quit smoking 1.5 years ago, 15 pack year history. Lives with her son.    Medications     Home Meds:  Prior to Admission medications    Not on File          Inpatient  Meds:  Scheduled:  ??? aspirin  81 mg Oral Daily with breakfast   ??? atorvastatin  10 mg Oral Nightly (2100)   ??? ceFEPime (MAXIPIME) IV extended infusion  2 g Intravenous Q24H   ??? famotidine  20 mg Oral BID   ??? flu vaccine (seasonal < 65 yr)(PF) IM 0.5 mL  0.5 mL Intramuscular During Hospitalization   ??? heparin (porcine)  5,000 Units Subcutaneous 3 times per day   ??? lactulose  10 g Oral TID   ??? lidocaine       ??? phytonadione (AQUA-MEPHYTON) IVPB  10 mg Intravenous Daily 0900   ??? rifaximin  550 mg Oral BID   ??? senna-docusate  1 tablet Oral BID   ??? vancomycin  15 mg/kg Intravenous Once       Continuous:  ??? dextrose 5% in water 50 mL/hr (05/04/14 1414)   ??? DOBUTamine in D5W 2.5 mcg/kg/min (05/04/14 1024)   ??? fentanyl (SUBLIMAZE) infusion standard concentration 200 mcg/hr (05/04/14 1410)   ??? furosemide (LASIX) infusion 1 mg/hr (05/04/14 1035)   ??? norepinephrine infusion 5 mcg/min (05/04/14 1206)   ??? propofol 15 mcg/kg/min (05/04/14 1406)         [name]:[id number]      Vital Signs     Temp:  [95.8 ??F (35.4 ??C)-98.3 ??F (36.8 ??C)] 96.2 ??F (35.7 ??C)  Heart Rate:  [84-109] 92  Resp:  [21-26] 22  BP: (84-116)/(47-75) 94/68 mmHg  FiO2:  [40 %-99 %] 99 %    Intake/Output Summary (Last 24 hours) at 05/04/14 1756  Last data filed at 05/04/14 1424   Gross per 24 hour   Intake  935.8 ml   Output   2935 ml   Net -1999.2 ml           Physical Exam     General appearance: lying in bed, sedated, intubated  Neck: Right IJ line and left IJ swan catheter  Lungs: clear to auscultation bilaterally  Heart: holosystolic blowing murmur best heard at the apex; strong apical impulse  Abdomen: soft, non-distended, pulsating, bowel sounds reduced  Pulses: dorsalis pedis pulses 1+ bilaterally  Skin: skin cool and dry       Laboratory Data         Lab 05/04/14  0504 05/03/14  2359   WBC 9.2 10.1   HEMOGLOBIN, POC 9.0* 9.5*   HEMATOCRIT BLOOD GAS 29.0* 29.7*   MCV, POC 79.9* 78.8*   PLATELETS, POC 217 244           Lab 05/04/14  1240 05/04/14  0504  05/04/14  0245 05/03/14  2359   SODIUM 136 133 132* 133   POTASSIUM 4.7 5.3 6.0* 6.5*   CHLORIDE 100 99 98 98   CARBON DIOXIDE (CO2) 27 27 24 24    BUN 93* 95* 95* 93*   CREATININE 4.53* 4.60* 4.49* 4.23*   GLUCOSE 42* 111* 183* 89           Lab 05/04/14  1240 05/04/14  0504 05/04/14  0245 05/03/14  2359   CALCIUM 10.0 10.4* 10.1 10.2   MAGNESIUM 3.3* 3.4*  --  3.5*   PHOSPHORUS 6.6* 6.2* 5.9* 6.2*           Lab 05/03/14  2359   INR POC 1.8*   PROTHROMBIN TIME 20.1*           Lab 05/04/14  1240 05/04/14  0504 05/04/14  0245 05/03/14  2359   ALT  --   --   --  47   AST  --   --   --  69*   ALK PHOS  --   --   --  73   BILIRUBIN, TOTAL  --   --   --  4.4*   BILIRUBIN, DIRECT  --   --   --  3.10*   ALBUMIN 3.1* 3.1* 3.0* 3.1*   3.1*           Lab 05/04/14  1729 05/04/14  0327   COLOR, URINE, POC Yellow Yellow   CLARITY, URINE, POC Clear Slightly-Cloudy*   PROTEIN UA, POC Negative Negative   PH UA 6.0 6.0   SPECIFIC GRAVITY, URINE 1.008 1.009   GLUCOSE UA, POC Negative Negative   BLOOD UA, POC Moderate* Large*   LEUKOCYTES UA, POC Trace* Negative   NITRITE UA Negative Negative   BILIRUBIN UA, POC Negative Negative   UROBILINOGEN UA, POC <2.0 <2.0   RBC UA 6* 7*   WBC UA 4 4   BACTERIA Rare* Rare*           Lab 05/03/14  2359   TROPONIN I 0.74*       Lab Results   Component Value Date    NTPROBNP 16109* 05/03/2014       No results found for: TSH, T3FREE, FREET4        Lab 05/04/14  1553 05/04/14  1414   GLU MONITORING DEVICE, POC 199* 100           Diagnostic Studies     X-ray Portable Chest   Final Result   IMPRESSION:      Lines as noted above.      Enlarged cardiac silhouette.      Right basilar atelectasis and/or pneumonia.      Report Verified by: Norberta Keens, M.D. at 05/04/2014 8:13 AM        Right Heart Catheterization:   RA pressure?? 9  RV pressure?? 52/9  PA pressure 55/28?? , PA mean 38  PCWP?? 25  RA sat?? 77%  RA Hgb?? 9.5  PA sat?? 77%  PA Hgb?? 8.9    ????????????????????????Fick  CO???????????? 7.1  CI????????????????  4.3????????????  SVR????????831  PVR????????146    Assessment & Plan     Waneta Fitting is a 64 y.o. female with shock and  Acute respiratory failure with hypoxia and hypercarbia. Medical problems being addressed in this encounter include the following:     Principal Problem:   Shock    Acute respiratory failure with hypoxia and hypercarbia    AKI (  acute kidney injury)      1. Shock: Based on hemodynamic data, it is a high cardiac output shock.  Her bi-ventricular heart failure  likely also contributing to her picture (shock with multi organ failure) While PCWP is elevated indicating hypervolemia, given the RA and PA saturations of 77% there is concern for high cardiac out put state conditions like sepsis, thyroid disease, severe anemia...  Her SVR was also low depside of being on high dose pressors indicating possible sepsis            -  Pt has mild hypervolemia but in the setting of the possible sepsis would be cautious with diuresing her  Goal CVP:8-15 and Gaol PCWP< 16-18  Swan # Q 8 hours   Labs Q 8 hours to monitor renal fx. May need renal consult if not improves for possible CVVH  Titrate the inotrope based on swan #, maybe able to wean off the dobutamine very slowly  Could consider adding Vasopressin 0.04 to help to wean off levophed  Gaol Map>60        - Monitor strict intake/output and daily weight       - Keep K>4 and Mg>2.0       Treat the septic shock per primary team    Troponinemia: Pt had chronically elevated Trop based on outside record. There is no ECG changes suggestive of ACS.  No need for heparin gtt. She has elevated INR which put her at increased risk of bleeding.  Continue ASA, atorvastatin    Disposition: Pt has poor prognosis. Discuss with family goals of care.      Thank you for the consult. Seen and discussed with cardiology attending Dr. Tiburcio Pea.      Lonna Duval, MD  Cardiology fellow

## 2014-05-04 NOTE — Unmapped (Signed)
MICU PROGRESS NOTE    05/04/2014 10:35 PM Patient: Gabrielle Rivera LOS: 1 days     Significant events over the past 24 hours include:  -Cardiology took pt for RHC - indicative of cardiogenic and septic shock picture  -swan and a-line placed during RHC  -Liver feels liver dysfunction likely 2/2 congestive hepatopathy +/- sepsis and not liver related  -discussed case with son of pt's poor prognosis. Son would like aggressive measures still.     DAILY PLAN  -f/u Cardiology recs (done)  -f/u Liver recs (done)  -f/u Renal recs (done)  -avoid fluids in setting of cardiogenic shock component  -arterial line and CVC in place  -panculture, start broad spectrum abx  -repeat TTE  -start levophed and dobutamine    ===========================================================  ASSESSMENT    64yo F HFrEF EF 15-20%, CAD s/p DES, CVA, HTN, HLD, CKD stage 3 transferred from [facility] 05/03/14 w/ concerns for fulminant hepatic failure, found to actually be in cardiogenic shock with Biventricular heart failure and likely sepsis    Neurologic  Intubated and sedated w/ propofol and fentanyl gtt    Hx CVA  - asa and statin    Pulmonary  Intubated to protect airway given history of encephalopathy from OSH  -sedated w/ propofol and fentanyl gtt    Cardiovascular  Cardiogenic shock in setting of HFrEF - EF 15-20%. Complicated by congestive hepatopathy and AKI.   -at beginning of admission prior to RHC, diuresed without much effect. Hold off on fluids  -biventricular heart failure with component of septic shock  -continue levophed and dobutamine  -check Scvo2  -daily weights, strict I/O. Foley in place  -RHC 05/04/14  RA pressure?? 9  RV pressure?? 52/9  PA pressure 55/28?? , PA mean 38  PCWP?? 25  RA sat?? 77%  RA Hgb?? 9.5  PA sat?? 77%  PA Hgb?? 8.9    ????????????????????????Fick  CO???????????? 7.1  CI???????????????? 4.3????????????  SVR????????831  PVR????????146      Septic shock  -levophed. Vancomycin/cefepime. pancultured  -hold off on fluids given cardiogenic  shock    Gastrointestinal  Congestive hepatopathy  -likely a result of heart failure and sepsis  -U/S abdomen ordered  -Liver agrees likely 2/2 heart failure plus sepsis. Rec checking HCV RNA  -3day vit k challenge    HCV Ab + : check HCV RNA    Chronic acalculous cholecystitis - HIDA scan w/ gallbladder wall thickening. No pericholecystic fluid collections    GI ppx - pt will be intubated >2 days. pepcid bid IV    Renal  AKI - likely related to cardiac dysfunction and sepsis. Cardiology rec q8h BMP. Renal following pt. Pt has been consented for vas cath if needed by son.   -monitor uremia    Acid/Base   Fine on arrival. Recheck vbg (mainly for scvo2) or can draw ABG via A-line    Genito-Urinary  Foley in place    Infectious Disease  Septic shock   -levophed. Vancomycin/cefepime. pancultured  -hold off on fluids given cardiogenic shock    Hep C ab +: check HCV RNA    Endocrine  Hyperglycemia - keep glucose ~180    Hematology/Oncology  Coagulopathy - INR on arrival elevated - likely 2/2 congestive hepatopathy. plt dysfunction from uremia  -Vit K challenge x3days    Psychiatry  See neuro    Fluid/Electrolytes/Nutrition   Intravascular volume assessment: volume up  IVF: avoid  Electrolyte Abnormalities: replete. Keep watch on Mg ~2 and K ~4  Nutrition: npo    Prophylaxis  DVT Prophylaxis: sqh  GI Prophylaxis: pepcid bid iv    Code Status Full Code    PT/OT/Reanimation    Family Communication  D/w son poor prognosis, he wants aggressive measures    Social Work    Disposition  MICU    ===========================================================  CODE STATUS: Full Code     ALLERGIES:No Known Allergies    Lines/Tubes (day):   ETT 05/03/14   Aline 05/04/14   Central lines 05/04/14   Foley 05/03/14       CAM: Overall CAM-ICU :  (uta)  RASS: [place] Agitation Sedation Scale: -4    DRIPS   Vasopressors: levo   Vasodilators:   Inotropes: dobutamine   Other:    INs & OUTs     Intake/Output Summary (Last 24 hours) at 05/04/14 2235  Last  data filed at 05/04/14 2100   Gross per 24 hour   Intake 1424.8 ml   Output   4535 ml   Net -3110.2 ml       VENTILATION   Vent Mode:  [-] VC-AC/PRVC  FiO2:  [40 %-100 %] 50 %  S RR:  [22] 22  S VT:  [350 mL] 350 mL  PEEP/CPAP:  [5 cm H20] 5 cm H20    No results found for: PHART, PCO2, PO2ART, HCO3ART, BEART, HBO2PER, O2SATART    VITALS   Temp (24hrs), Avg:97.4 ??F (36.3 ??C), Min:95.8 ??F (35.4 ??C), Max:98.3 ??F (36.8 ??C)    Patient Vitals for the past 4 hrs:   BP Temp Temp src Pulse Resp SpO2   05/04/14 2200 - - - 93 22 100 %   05/04/14 2100 - - - 94 22 100 %   05/04/14 2010 - - - 92 22 100 %   05/04/14 2000 91/60 mmHg 98 ??F (36.7 ??C) Axillary 92 24 100 %   05/04/14 1900 103/62 mmHg - - 92 22 100 %       PHYSICAL EXAM  General: sedated/intubated  Neurologic: difficult assessing 2/2 sedated/intubated.   Pulmonary: bilateral coarse breath sounds  Cardiovascular: tachycardic. Systolic murmur. jvp+  Abdominal: soft, nt/nd, no masses  Extremities: trace pedal edema bilat  Other:     ANTIBIOTICS  Vanc/cefepime 05/04/14    NUTRITION  Npo    LABS   Lab Results   Component Value Date    GLUCOSE 42* 05/04/2014    BUN 93* 05/04/2014    CREATININE 4.53* 05/04/2014    K 4.7 05/04/2014    NA 136 05/04/2014    CL 100 05/04/2014    CALCIUM 10.0 05/04/2014    Lab Results   Component Value Date    MG 3.3* 05/04/2014      Lab Results   Component Value Date    PHOS 6.6* 05/04/2014     Lab Results   Component Value Date    ALKPHOS 73 05/03/2014    ALT 47 05/03/2014    AST 69* 05/03/2014    BILITOT 4.4* 05/03/2014    ALBUMIN 3.1* 05/04/2014    BILIDIRECT 3.10* 05/03/2014    PROT 7.1 05/03/2014     Lab Results   Component Value Date    WBC 9.2 05/04/2014    HGB 9.0* 05/04/2014    HCT 29.0* 05/04/2014    MCV 79.9* 05/04/2014    PLT 217 05/04/2014     No results found for: ANTIXAF No results found for: PTT Lab Results   Component Value Date    INR  1.8* 05/03/2014    PROTIME 20.1* 05/03/2014    No results found for: FIBRINOGEN No results found  for: TEGANGLE, TEGKTIME, TEGLYSIS30, TEGMAXAMPL, TEGRTIME, CBMZ  FSBS Range: No results found for: GLUFAST    MICROBIOLOGY  05/04/14 bcx peripheral x2  05/04/14 bcx line  05/04/14 ucx    ===========================================================    Nili Honda, MD  10:35 PM

## 2014-05-04 NOTE — Unmapped (Signed)
Problem: Non-violent, non-self-destructive restraints  Less restrictive alternative interventions will be considered prior to application of restraint devices.   Goal: Patient will be restrained only to maintain safety  Alternatives to restraints can include; moving the patient closer to the nurses station, video monitoring where available, medicating for pain, agitation or psychosis, psychosocial interventions, manipulation of environment, frequent toileting, diversional activities, bed alarms, having family members sit with patient, frequent reorientation, or repositioning.   Outcome: Progressing  Patient remains in restraints at this time to protect medical lines and airway. No signs of injury. Patient is impulsive reaching for airways and medical lines. Restraint protocol followed. Patient turned Q2 hours and assessed for incontinance. Patient confused. Continue to reorient patient to surroundings and requirents to get rid of restraints. No evidence of learning. Will continue to monitor.

## 2014-05-04 NOTE — Unmapped (Signed)
ICU ATTENDING PROGRESS NOTE:    LOS: hospital day 1     CRITICAL CARE TIME:  TOTAL CRITICAL CARE TIME caring for this patient with life threatening, unstable organ failure, including direct patient contact, management of life support systems, review of data including imaging and labs, discussion with other team members and physicians and excluding any separately billed procedures, 40 minutes today.      I independently examined Gabrielle Rivera.  24 hour events, radiographs and labs were reviewed.  The case has been discussed in detail on multidisciplinary critical care rounds.  I agree with the assessment and plan as outlined on the team's daily note. The overall plan of care is as follows:     ASSESSMENT:  ?? Probable decompensated Bi-V heart failure - suspect this is the etiology for liver abn (congestion) and AKI.  Case has been discussed at length w/ cardiology, hepatology and nephrology.  Patient is undergoing a RHC at this time for better assessment.  TTE official report pending, but wet read c/w bi-v failure.  ?? Cardiogenic shock - as above.  Started on levo and dobutamine this AM.  Less likely cause for hypotension would be sepsis, but do not see overt signs of infection at this time  ?? Hyperbili/elevated INR  - suspect hepatic congestion from heart failure  ?? AKI on CKD - likely 2' to decompensated heart failure.  Some response to lasix overnight.  Nephrology team considering CRRT to help w/ volume overload if no longer responding to diuresis  ?? GB wall thickening  ?? Anemia  ?? Hyperkalemia -improved w/ medical therapy  ?? Hyperphosphatemia  ?? Hep C +  ?? PMH: HFrEF EF 15-20%, CAD s/p DES, CVA, HTN, HLD and stage III CKD    DAILY PLAN:   ?? Intubated prior to transfer for AMS??  ?? RHC planned for this afternoon, patient may transfer to CVICU if bed available  ?? Arterial line  ?? Low dose levophed/dobutamine  ?? Repeat TTE being performed this AM  ?? COnt ASA and statin  ?? Minimimize sedation  ?? Reasonable to obtain  cultures and start broad spectrum ABX given shock state w/ multi-organ failure.  Will DC if w/u compatible w/ cardiogenic shock as suspected.  ?? F/u w/ GI and nephro input      Early Osmond, DO  Attending Physician  Division of Pulmonary and Critical Care Medicine  05/04/2014  4:05 PM

## 2014-05-04 NOTE — Unmapped (Signed)
Entered in error.

## 2014-05-04 NOTE — Unmapped (Signed)
Pre procedural Sedation Assessment Note    Pre- procedure Diagnosis --- cardiovascular shock, septic?/cardiogenic/ likely mixed picture; h/o severe ICMP s/p AICD    Planned procedure --- RHC    H&P reviewed    No Known Allergies    No current facility-administered medications on file prior to encounter.     No current outpatient prescriptions on file prior to encounter.       Past Medical History   Diagnosis Date   ??? CHF (congestive heart failure)    ??? Asthma    ??? Hypertension    ??? Hyperlipemia    ??? Stroke 2014   ??? Hepatitis C        Nursing history and assessment - reviewed    ASA classification - IV    Physical Examination     Filed Vitals:    05/04/14 1400   BP: 95/57   Pulse: 84   Temp: 95.8 ??F (35.4 ??C)   Resp: 22   SpO2: 100%       General appearance - AAOX3  Chest - clear to auscultation  Heart - Regular heart sounds  Neck - No JVD    Airway assessment - Mallampati class - patient is already intubated and ventilated    Planned anesthetic agent - will continue sedation she is on; can use versed/ fentanyl as needed    Sedation plan, risks, benefits. Potential complications and any alternative options associated with procedure and possible use of blood discussed with patient. Consent obtained.    Plan:    Proceed with procedure

## 2014-05-04 NOTE — Unmapped (Signed)
I saw and examined the patient. I discussed with the resident or fellow and agree with resident's/fellow's findings and plan as documented in the note. Please also see below.     Briefly, 64 y/o female with multiple comorbidities ( CHF, CAD, CAD, CKD) transferred for multi-system organ failure and concern for elevated liver tests. Her liver tests were elevated in atypical pattern back in June 2015 (AST-[id number], ALT-628) and positive HCV antibody. Now, presents with RUQ pain, worsening mental status and multi-system organ failure ( resp, heart, kidneys). Liver tests are not consistent with acute liver failure ( ALT/AST very mildly elevated) with TB-4 and rising INR. Clinically, she has evidence of worsening CHF and volume overload. CT scan / ultrasound reveal no intrahepatic abnormalities or evidence of advanced liver disease. In addition, family reports acetaminophen use over past few weeks - but negative levels and no evidence of significant hepatitis.     PE:  Intubated, sedated  No stigmata of chronic liver disease  NT/ND abdomen      A/P: Elevated liver tests - The patient does not have acute liver failure. Suspect she may have chronic HCV and agree with HCV PCR. In addition, she likely is exhibiting signs of congestive hepatopathy based on rising bilirubin / INR in setting of CHF ( based on TTE at OSH). In addition, this could be related to sepsis and would consider pan-cultures and addition of antibiotics. Strongly believe that liver tests abnormalities are not intrinsic and innocent bystander to another systemic process.     Willeen Niece, MD  Transplant Hepatologist

## 2014-05-04 NOTE — Unmapped (Signed)
Fellow Name:  Modena Nunnery, MD  Attending Name: Dr. Lajean Silvius) PERFORMED:  1. Right heart catheterization with cardiac outputs  2. Left IJ vein access with ultrasound and fluoroscopic guidance  3. Left radial arterial access for arterial pressure monitoring    INDICATIONS: 64 yo F with h/o CAD and DES PCI, transferred from [facility] health network after a day in ICU for acute encephalopathy, AKI and HFrEF with worsening hypotension.     DETAILS OF PROCEDURE:    Procedure, it risks, benefits and alternatives were explained to the patient, all questions were answered and informed consent was obtained.    The patient was brought to [facility] and prepped and draped in the usual sterile fashion for intended access to the left internal jugular vein.  Lidocaine 2% was injected subcutaneously to achieve local anesthesia.  The leftt IJ vein was accessed via modified Seldinger technique without difficulty under ultrasound guidance. A 8 French venous sheath was advanced over a wire into the vein, the wire removed and the sheath aspirated and flushed.     Next a Swan-Ganz catheter was inserted to 20  cm and then the balloon was inflated under fluoroscopic guidance and advanced into IVC, where IVC sat was obtained. Next it was advanced into the right atrium, then right ventricle, then pulmonary artery, and then subsequently pulmonary capillary wedge position.  The balloon was then deflated and cardiac output and index were measured with thermodilution method and a pulmonary artery saturation was drawn.     At the end of the case the Swan-Ganz catheter was covered with swandom and left at depth of 58 cms at the hub.    The sheath was secured with a suture    Left radial artery was accessed using true Seldinger technique using A-line kit needle and wire and a-lin sheath was placed with good waveform. Sheath was secured with suture and heavy dressing.     At the end of procedure, no  complication noted.      HEMODYNAMIC FINDINGS/RHC:  RA pressure  9  RV pressure  52/9  PA pressure 55/28  , PA mean 38  PCWP  25  RA sat  77%  RA Hgb  9.5  PA sat  77%  PA Hgb  8.9     Fick  CO       7.1  CI         4.3   SVR 831  PVR 146    FINAL DIAGNOSIS(ES):  Mild hypervolemia with mild to moderate pulmonary hypertension and hyperdynamic CO/CI given her known LVEF of 10%; however this data is obtained on patient being on inotropic and vasopressor support.     PLAN:   Will defer to primary service and cardiology consults service    Dr. Jacelyn Pi was present during all critical and key portions of the procedure and was available to furnish services during the entire procedure.    Elmer Bales [facility]  Cardiology Fellow  Pager [phone]

## 2014-05-05 ENCOUNTER — Inpatient Hospital Stay: Payer: PRIVATE HEALTH INSURANCE

## 2014-05-05 ENCOUNTER — Inpatient Hospital Stay: Admit: 2014-05-05 | Payer: PRIVATE HEALTH INSURANCE

## 2014-05-05 LAB — BASIC METABOLIC PANEL
Anion Gap: 8 mmol/L (ref 3–16)
Anion Gap: 10 mmol/L (ref 3–16)
Anion Gap: 7 mmol/L (ref 3–16)
BUN: 87 mg/dL (ref 7–25)
BUN: 83 mg/dL (ref 7–25)
BUN: 76 mg/dL (ref 7–25)
CO2: 27 mmol/L (ref 21–33)
CO2: 27 mmol/L (ref 21–33)
CO2: 27 mmol/L (ref 21–33)
Calcium: 9.2 mg/dL (ref 8.6–10.3)
Calcium: 9.2 mg/dL (ref 8.6–10.3)
Calcium: 8.5 mg/dL (ref 8.6–10.3)
Chloride: 101 mmol/L (ref 98–110)
Chloride: 99 mmol/L (ref 98–110)
Chloride: 104 mmol/L (ref 98–110)
Creatinine: 4.01 mg/dL (ref 0.60–1.30)
Creatinine: 3.9 mg/dL (ref 0.60–1.30)
Creatinine: 3.62 mg/dL (ref 0.60–1.30)
GFR MDRD Af Amer: 14 See note.
GFR MDRD Af Amer: 14 See note.
GFR MDRD Af Amer: 15 See note.
GFR MDRD Non Af Amer: 11 See note.
GFR MDRD Non Af Amer: 12 See note.
GFR MDRD Non Af Amer: 13 See note.
Glucose: 149 mg/dL (ref 70–100)
Glucose: 101 mg/dL (ref 70–100)
Glucose: 107 mg/dL (ref 70–100)
Osmolality, Calculated: 311 mOsm/kg (ref 278–305)
Osmolality, Calculated: 307 mOsm/kg (ref 278–305)
Osmolality, Calculated: 309 mOsm/kg (ref 278–305)
Potassium: 4.3 mmol/L (ref 3.5–5.3)
Potassium: 4 mmol/L (ref 3.5–5.3)
Potassium: 3.7 mmol/L (ref 3.5–5.3)
Sodium: 136 mmol/L (ref 133–146)
Sodium: 136 mmol/L (ref 133–146)
Sodium: 138 mmol/L (ref 133–146)

## 2014-05-05 LAB — VENOUS O2 SAT, MEASURED
%HBO2, Venous: 68.4 % (ref 40.0–70.0)
Carboxyhemoglobin, Venous: 2.1 %
Methemoglobin, Venous: 0 % (ref 0.0–1.5)
Reduced hemoglobin, Venous: 29.5 % — ABNORMAL HIGH (ref 0.0–5.0)

## 2014-05-05 LAB — BLOOD GAS, ARTERIAL
%HBO2, Arterial: 94.6 % — ABNORMAL LOW (ref 95.0–98.0)
%HBO2, Arterial: 93.9 % — ABNORMAL LOW (ref 95.0–98.0)
%HBO2, Arterial: 93.5 % — ABNORMAL LOW (ref 95.0–98.0)
Base Excess, Arterial: 4.7 mmol/L — ABNORMAL HIGH (ref <=3.0)
Base Excess, Arterial: 1 mmol/L (ref <=3.0)
Base Excess, Arterial: 4.1 mmol/L — ABNORMAL HIGH (ref <=3.0)
CO2 Content,Arterial: 29 mmol/L — ABNORMAL HIGH (ref 23–27)
CO2 Content,Arterial: 27 mmol/L (ref 23–27)
CO2 Content,Arterial: 30 mmol/L — ABNORMAL HIGH (ref 23–27)
Carboxyhemoglobin, Arterial: 2.7 %
Carboxyhemoglobin, Arterial: 2.9 %
Carboxyhemoglobin, Arterial: 3.1 %
HCO3, Arterial: 28 mmol/L — ABNORMAL HIGH (ref 22–26)
HCO3, Arterial: 25 mmol/L (ref 22–26)
HCO3, Arterial: 29 mmol/L — ABNORMAL HIGH (ref 22–26)
Methemoglobin, Arterial: 1.6 % — ABNORMAL HIGH (ref 0.0–1.5)
Methemoglobin, Arterial: 1.7 % — ABNORMAL HIGH (ref 0.0–1.5)
Methemoglobin, Arterial: 1.6 % — ABNORMAL HIGH (ref 0.0–1.5)
Reduced hemoglobin, Arterial: <2.4 % (ref 0.0–5.0)
Reduced hemoglobin, Arterial: <2.4 % (ref 0.0–5.0)
Reduced hemoglobin, Arterial: <2.4 % (ref 0.0–5.0)
pCO2, Arterial: 34 mmHg — ABNORMAL LOW (ref 35–45)
pCO2, Arterial: 38 mmHg (ref 35–45)
pCO2, Arterial: 42 mmHg (ref 35–45)
pH, Arterial: 7.52 — ABNORMAL HIGH (ref 7.35–7.45)
pH, Arterial: 7.44 (ref 7.35–7.45)
pH, Arterial: 7.44 (ref 7.35–7.45)
pO2, Arterial: 193 mmHg — ABNORMAL HIGH (ref 80–100)
pO2, Arterial: 150 mmHg — ABNORMAL HIGH (ref 80–100)
pO2, Arterial: 129 mmHg — ABNORMAL HIGH (ref 80–100)

## 2014-05-05 LAB — VENOUS BLOOD GAS, LINE/SYRINGE
%HBO2-Line Draw: 82.3 % — ABNORMAL HIGH (ref 40.0–70.0)
%HBO2-Line Draw: 67.2 % (ref 40.0–70.0)
Base Excess-Line Draw: 5.3 mmol/L — ABNORMAL HIGH (ref <=3.0)
Base Excess-Line Draw: 5.7 mmol/L — ABNORMAL HIGH (ref <=3.0)
CO2 Content-Line Draw: 31 mmol/L — ABNORMAL HIGH (ref 25–29)
CO2 Content-Line Draw: 32 mmol/L — ABNORMAL HIGH (ref 25–29)
Carboxyhgb-Line Draw: 2.6 %
Carboxyhgb-Line Draw: 1.8 % (ref 0.0–2.0)
HCO3-Line Draw: 30 mmol/L — ABNORMAL HIGH (ref 24–28)
HCO3-Line Draw: 31 mmol/L — ABNORMAL HIGH (ref 24–28)
Methemoglobin-Line Draw: 0 % (ref 0.0–1.5)
Methemoglobin-Line Draw: 0.7 % (ref 0.0–1.5)
PCO2-Line Draw: 41 mmHg (ref 41–51)
PCO2-Line Draw: 49 mmHg (ref 41–51)
PH-Line Draw: 7.47 — ABNORMAL HIGH (ref 7.32–7.42)
PH-Line Draw: 7.41 (ref 7.32–7.42)
PO2-Line Draw: 52 mmHg — ABNORMAL HIGH (ref 25–40)
PO2-Line Draw: 42 mmHg — ABNORMAL HIGH (ref 25–40)
Reduced Hemoglobin-Line Draw: 16.5 % — ABNORMAL HIGH (ref 0.0–5.0)
Reduced Hemoglobin-Line Draw: 30.3 % — ABNORMAL HIGH (ref 0.0–5.0)

## 2014-05-05 LAB — CBC
Hematocrit: 29.5 % (ref 35.0–45.0)
Hemoglobin: 9.5 g/dL (ref 11.7–15.5)
MCH: 25.4 pg (ref 27.0–33.0)
MCHC: 32.1 g/dL (ref 32.0–36.0)
MCV: 78.9 fL (ref 80.0–100.0)
MPV: 8.5 fL (ref 7.5–11.5)
Platelets: 201 10*3/uL (ref 140–400)
RBC: 3.74 10*6/uL (ref 3.80–5.10)
RDW: 18.3 % (ref 11.0–15.0)
WBC: 7.2 10*3/uL (ref 3.8–10.8)

## 2014-05-05 LAB — POC GLU MONITORING DEVICE
POC Glucose Monitoring Device: 193 mg/dL (ref 70–100)
POC Glucose Monitoring Device: 106 mg/dL — ABNORMAL HIGH (ref 70–100)

## 2014-05-05 LAB — MAGNESIUM
Magnesium: 2.6 mg/dL — ABNORMAL HIGH (ref 1.5–2.5)
Magnesium: 3.2 mg/dL — ABNORMAL HIGH (ref 1.5–2.5)
Magnesium: 2.8 mg/dL — ABNORMAL HIGH (ref 1.5–2.5)

## 2014-05-05 LAB — VANCOMYCIN, RANDOM: Vancomycin Random: 15.5 ug/mL

## 2014-05-05 LAB — PHOSPHORUS
Phosphorus: 5.3 mg/dL — ABNORMAL HIGH (ref 2.1–4.7)
Phosphorus: 4.2 mg/dL (ref 2.1–4.7)
Phosphorus: 3.5 mg/dL (ref 2.1–4.7)

## 2014-05-05 LAB — HEPATITIS C RNA, QUANTITATIVE REFLEX TO GENOTYPING
IU log10: 5.07 log 10 IU/mL
International Units: 118252 IU/mL

## 2014-05-05 LAB — HEPATITIS C RNA, RNA GENOTYPING ONLY: Hepatitis C Genotype: DETECTED

## 2014-05-05 LAB — BLOOD CULTURE-PERIPHERAL: Culture Result: NO GROWTH

## 2014-05-05 LAB — TROPONIN I
Troponin I: 1.04 ng/mL — CR (ref 0.00–0.03)
Troponin I: 1.12 ng/mL — CR (ref 0.00–0.03)

## 2014-05-05 LAB — PROTIME-INR
INR: 1.6 — ABNORMAL HIGH (ref 0.9–1.1)
Protime: 18.6 s — ABNORMAL HIGH (ref 11.6–14.4)

## 2014-05-05 MED ORDER — vancomycin intermittent/pulse dosing
Status: AC | PRN
Start: 2014-05-05 — End: 2014-05-11

## 2014-05-05 MED ORDER — midazolam (PF) (VERSED) injection 4 mg
1 | Freq: Once | INTRAMUSCULAR | Status: AC
Start: 2014-05-05 — End: 2014-05-05

## 2014-05-05 MED ORDER — milrinone in 5 % dextrose (PRIMACOR) 20 mg/100 mL (200 mcg/mL) infusion
20 | INTRAVENOUS | Status: AC
Start: 2014-05-05 — End: 2014-05-06
  Administered 2014-05-06: 03:00:00 0.125 ug/kg/min via INTRAVENOUS

## 2014-05-05 MED ORDER — ketamine (KETALAR) 500 mg in sodium chloride 0.9 % 250 mL infusion
100 | INTRAMUSCULAR | Status: AC
Start: 2014-05-05 — End: 2014-05-05

## 2014-05-05 MED ORDER — vancomycin 1000 mg/D5W 200 mL IV PGBK
1 | Freq: Once | INTRAVENOUS | Status: AC
Start: 2014-05-05 — End: 2014-05-05
  Administered 2014-05-05: 19:00:00 1000 mg/kg via INTRAVENOUS

## 2014-05-05 MED ORDER — magnesium sulfate in sterile water 50 mL PgBk 2 g
2 | Freq: Once | INTRAVENOUS | Status: AC
Start: 2014-05-05 — End: 2014-05-05

## 2014-05-05 MED ORDER — ketamine (KETALAR) 500 mg in sodium chloride 0.9 % 250 mL infusion
100 | INTRAMUSCULAR | Status: AC
Start: 2014-05-05 — End: 2014-05-06
  Administered 2014-05-05: 18:00:00 2.5 ug/kg/min via INTRAVENOUS

## 2014-05-05 MED ORDER — midazolam (PF) (VERSED) 1 mg/mL injection
1 | INTRAMUSCULAR | Status: AC
Start: 2014-05-05 — End: 2014-05-05
  Administered 2014-05-05: 10:00:00 4 via INTRAVENOUS

## 2014-05-05 MED ORDER — magnesium sulfate in sterile water 50 mL 2 gram/50 mL (4 %) PgBk
2 | INTRAVENOUS | Status: AC
Start: 2014-05-05 — End: 2014-05-05
  Administered 2014-05-05: 07:00:00 2 via INTRAVENOUS

## 2014-05-05 MED FILL — MILRINONE 20 MG/100 ML(200 MCG/ML) IN 5 % DEXTROSE INTRAVENOUS PIGGYBK: 20 20 mg/100 mL (200 mcg/mL) | INTRAVENOUS | Qty: 100

## 2014-05-05 MED FILL — ASPIRIN 81 MG CHEWABLE TABLET: 81 81 MG | ORAL | Qty: 1

## 2014-05-05 MED FILL — XIFAXAN 550 MG TABLET: 550 550 mg | ORAL | Qty: 1

## 2014-05-05 MED FILL — FENTANYL (PF) 50 MCG/ML INJECTION SOLUTION: 50 50 mcg/mL | INTRAMUSCULAR | Qty: 40

## 2014-05-05 MED FILL — HEPARIN (PORCINE) 5,000 UNIT/ML INJECTION SOLUTION: 5000 5,000 unit/mL | INTRAMUSCULAR | Qty: 1

## 2014-05-05 MED FILL — LACTULOSE 10 GRAM/15 ML (15 ML) ORAL SOLUTION: 10 10 gram/15 mL (15 mL) | ORAL | Qty: 15

## 2014-05-05 MED FILL — KETAMINE 100 MG/ML INJECTION SOLUTION: 100 100 mg/mL | INTRAMUSCULAR | Qty: 5

## 2014-05-05 MED FILL — MIDAZOLAM (PF) 1 MG/ML INJECTION SOLUTION: 1 1 mg/mL | INTRAMUSCULAR | Qty: 4

## 2014-05-05 MED FILL — ATORVASTATIN 10 MG TABLET: 10 10 MG | ORAL | Qty: 1

## 2014-05-05 MED FILL — LEVOPHED 1 MG/ML INTRAVENOUS SOLUTION: 1 1 mg/mL | INTRAVENOUS | Qty: 16

## 2014-05-05 MED FILL — CEFEPIME 2 GRAM/100 ML IN DEXTROSE (ISO-OSMOTIC) INTRAVENOUS PIGGYBACK: 2 2 gram/100 mL | INTRAVENOUS | Qty: 100

## 2014-05-05 MED FILL — FAMOTIDINE (PF) 20 MG/2 ML INTRAVENOUS SOLUTION: 20 20 mg/2 mL | INTRAVENOUS | Qty: 2

## 2014-05-05 MED FILL — SENNA-S 8.6 MG-50 MG TABLET: 8.6-50 8.6-50 mg | ORAL | Qty: 1

## 2014-05-05 MED FILL — VANCOMYCIN INTERMITTENT/PULSE DOSING: Qty: 1

## 2014-05-05 MED FILL — MAGNESIUM SULFATE 2 GRAM/50 ML (4 %) IN WATER INTRAVENOUS PIGGYBACK: 2 2 gram/50 mL (4 %) | INTRAVENOUS | Qty: 50

## 2014-05-05 MED FILL — PROPOFOL INFUSION 10 MG/ML: 10 10 mg/mL | INTRAVENOUS | Qty: 100

## 2014-05-05 MED FILL — VANCOMYCIN 1 GRAM/200 ML IN DEXTROSE 5 % INTRAVENOUS PIGGYBACK: 1 1 gram/200 mL | INTRAVENOUS | Qty: 200

## 2014-05-05 MED FILL — VITAMIN K1 10 MG/ML INJECTION SOLUTION: 10 10 mg/mL | INTRAMUSCULAR | Qty: 0.5

## 2014-05-05 NOTE — Unmapped (Signed)
Gabrielle Rivera    Gabrielle Rivera is a 64 y.o. female currently being treated broadly for possible sepsis.    Patient has No Known Allergies.    Pharmacy consulted for vancomycin management by Avon Gully, MD.    Current regimen is:  Vancomycin - pulse dosed based on random levels  Cefepime 2 grams IV q24h      Objective Data     Wt Readings from Last 1 Encounters:   05/05/14 125 lb 10.6 oz (57 kg)     Ht Readings from Last 1 Encounters:   05/03/14 5' 4 (1.626 m)     Temp:  [95.8 ??F (35.4 ??C)-99.3 ??F (37.4 ??C)] 98.9 ??F (37.2 ??C)  Heart Rate:  [84-120] 118  Resp:  [20-26] 22  BP: (90-110)/(57-68) 91/60 mmHg  FiO2:  [40 %-100 %] 40 %  I/O last 3 completed shifts:  In: 2150.9 [I.V.:1810.9; NG/GT:340]  Out: 5535 [Urine:5535]      Lab 05/05/14  0441 05/04/14  2336 05/04/14  1240 05/04/14  0504  05/03/14  2359   WBC 7.2  --   --  9.2  --  10.1   BUN 83* 87* 93* 95*  < > 93*   CREATININE 3.90* 4.01* 4.53* 4.60*  < > 4.23*   < > = values in this interval not displayed.    Cultures     All blood and urin cultures from this hospitalization are NGTD    Vancomycin Concentrations     Recent Labs      05/05/14   0441   VANCORANDOM  15.5       Assessment and Plan     ?? Vancomycin concentration of 15.5 mg/L was drawn this am.   ?? Goal vancomycin trough of 15-20 mg/L  ?? Will redose with 1 gram of vancomycin now.  Recommend continuing to pulse dose vancomycin base on random levels.  ?? Will check vancomycin serum concentration with am labs on Friday  ?? Pharmacy will continue to monitor therapy for efficacy and toxicity.      Grace Blight, Pharm.D.  Clinical Pharmacy Specialist, Critical Care  Pager: [phone]  [phone]  On-call pager: [phone]  05/05/2014 11:54 AM

## 2014-05-05 NOTE — Unmapped (Signed)
Renal Consult Follow Up Note      Chief complaint on admission : concern for hepatic failure/AKI    Reason for Renal Consult : AKI       HPI:  Gabrielle Rivera is a 64 y.o. 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 As transfer from [facility] for continued evaluation of AMS/renal failure/liver dysfunction?? for whom we are consulted for AKI.    Patient recently diagnosed with uterine fibroid and was taking Tylenol for pain although levels were not largely elevated on admission to [facility].    Patient presented 05/02/14 to [facility] with N/V/abd pain noted to have elevated INR 1.5 and elevated T bili to 1.10. BUN/Cr on admission was 59/2.78 with baseline ~2.0.?? U/S Abd showed diffuse gallbladder thickening which couple imply cholecystitis without stones found.?? 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 Patient was taking Lasix 40 mg BID / Lisinopril 2.5 mg which were held on admission due to AKI; light hydration NS @ 50 ml/hr per chart.    Patient became altered and started having hallucinations and was sedated/intubated for imaging of head. CT head without acute findings.    Contrast exposure:?? None  Nephrotoxic drug exposure: None  Hypotensive episodes: None    Past medical, family, and social histories were reviewed as previously documented. Updates were made as necessary.    Interval History:   S/p Cardiology consult and RHC.   Cardiology is concerned about hight output failure.   Patient has responded very well to IV diuresis.         HISTORIES.    PFSH unchanged    ROS.  Cannot obtain from intubated pt.       PHYSICAL EXAM    Temp:  [95.8 ??F (35.4 ??C)-99.3 ??F (37.4 ??C)] 98.9 ??F (37.2 ??C)  Heart Rate:  [84-120] 108  Resp:  [20-26] 22  BP: (91-110)/(57-68) 91/60 mmHg  FiO2:  [40 %-100 %] 40 %    Wt Readings from Last 3 Encounters:    05/05/14 125 lb 10.6 oz (57 kg)         Intake/Output Summary (Last 24 hours) at 05/05/14 1313  Last data filed at 05/05/14 1041   Gross per 24 hour   Intake 1338.8 ml   Output   3425 ml   Net -2086.2 ml       Constitutional :Intuabated and sedated.   Skin :   No rash, lesions, warm to touch  HENT:  Normocephalic, external ears and nose normal  Eyes :   no injection, icterus, EOM grossly intact.  Neck :   supple, No tracheal shift, no Thy mass  Chest :  No respiratory distress, On ventilator.     clear to auscultation, BS vesicular +0  CVS :   HR regular, HS S1 S2 + 0    LE edema neg  ABD :   non-distended, soft, non-tender,   Psych :  mood and affect approtriate, normal interaction, eye contact.      No Known Allergies    MEDS     Scheduled Meds:  ??? aspirin  81 mg Oral Daily with breakfast   ??? atorvastatin  10 mg Oral Nightly (2100)   ??? ceFEPime (MAXIPIME) IV extended infusion  2 g Intravenous Q24H   ??? famotidine (PF)  20 mg Intravenous  Daily 0900   ??? flu vaccine (seasonal < 65 yr)(PF) IM 0.5 mL  0.5 mL Intramuscular During Hospitalization   ??? heparin (porcine)  5,000 Units Subcutaneous 3 times per day   ??? lactulose  10 g Oral TID   ??? phytonadione (AQUA-MEPHYTON) IVPB  5 mg Intravenous Daily 0900   ??? rifaximin  550 mg Oral BID   ??? senna-docusate  1 tablet Oral BID   ??? vancomycin  15 mg/kg Intravenous Once     Continuous Infusions:  ??? DOBUTamine in D5W 5 mcg/kg/min (05/05/14 0415)   ??? fentanyl (SUBLIMAZE) infusion standard concentration 200 mcg/hr (05/05/14 1008)   ??? ketamine (KETALAR) 500 mg infusion     ??? norepinephrine infusion 8 mcg/min (05/05/14 0556)   ??? propofol 15 mcg/kg/min (05/05/14 1009)     PRN Meds:.ondansetron, vancomycin intermittent/pulse dosing      DATA :       Date 05/04/14 0700 - 05/05/14 0659 05/05/14 0700 - 05/06/14 0659   Shift 0700-1459 1500-2259 2300-0659 24 Hour Total 0700-1459 1500-2259 2300-0659 24 Hour Total   I  N  T  A  K  E   I.V.  (mL/kg) 675.8  (11.2) 409  (6.8) 726.1  (12.7)  1810.9  (31.8)          I.V. 320.4 250 290 860.4          Volume (mL) Dobutamine 7.2 9.8 29.7 46.7          Volume (mL) Propofol  78.8 40.1 57.6 176.5          Volume (mL) Fentanyl 178.2 83.8 226 488          Volume (mL) Furosemide 3.8 5.1  8.9          Magnesium Volume   50 50          Volume (mL) Norepinephrine 87.4 20.2 72.8 180.4        NG/GT 260 80  340 75   75      Flushes (mL) (NG/OG Tube Orogastric Left mouth) 260 80  340 25   25      Intake (mL) (NG/OG Tube Orogastric Left mouth) 0   0 50   50    Shift Total  (mL/kg) 935.8  (15.5) 489  (8.1) 726.1  (12.7) 2150.9  (37.7) 75  (1.3)   75  (1.3)   O  U  T  P  U  T   Urine  (mL/kg/hr) 2085  (4.3) 1600  (3.3) 1000  (2.2) 4685  (3.4) 290   290      Output (mL) (Urethral Catheter Latex;Double-lumen) 2085 1600 1000 4685 290   290    Emesis/NG output 0   0          Drainage Output (mL) (NG/OG Tube Orogastric Left mouth) 0   0        Stool              Stool Occurrence 0 x 1 x 1 x 2 x        Shift Total  (mL/kg) 2085  (34.6) 1600  (26.5) 1000  (17.5) 4685  (82.2) 290  (5.1)   290  (5.1)   Weight (kg) 60.3 60.3 57 57 57 57 57 57       Lab Results   Component Value Date    CREATININE 3.62* 05/05/2014    BUN 76* 05/05/2014    NA 138 05/05/2014    K 3.7 05/05/2014  CL 104 05/05/2014    CO2 27 05/05/2014     Lab Results   Component Value Date    CALCIUM 8.5* 05/05/2014    PHOS 3.5 05/05/2014     No results found for: [facility]  Lab Results   Component Value Date    WBC 7.2 05/05/2014    HGB 9.5* 05/05/2014    HCT 29.5* 05/05/2014    MCV 78.9* 05/05/2014    PLT 201 05/05/2014     Lab Results   Component Value Date    IRON 17* 05/04/2014    TIBC 377 05/04/2014    FERRITIN 42.4 05/04/2014     Lab Results   Component Value Date    KETONESU Negative 05/04/2014    BLOODU Moderate* 05/04/2014    BILIRUBINUR Negative 05/04/2014    UROBILINOGEN <2.0 05/04/2014    PROTEINUA Negative 05/04/2014    NITRITE Negative 05/04/2014    LEUKOCYTESUR Trace* 05/04/2014    PHUR 6.0 05/04/2014     LABSPEC 1.008 05/04/2014    CLARITYU Clear 05/04/2014    COLORU Yellow 05/04/2014           In addition to the above, I have reviewed an extensive amount of complex data.    Morbidity / complication risk is felt to be :  high.      Assessment/Plan:   Gracyn Allor is a 64 y.o. 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 As transfer from [facility] for continued evaluation of AMS/renal failure/liver dysfunction?? for whom we are consulted for AKI.    #.) Anuric AKI on CKD  - Baseline SCr 2.5 likely 2/2 poor perfusion CHF/HTN; R kidney ~ 9 cm at OSH suggesting chronic disease  - Unclear proteinuria prior to AKI; no records in system  - Suspect AKI etiology pre-renal to ATN on ACE vs. CRS vs. HRS  - UNa at OSH 28; suggesting pre-renal picture but unlikely HRS especially given liver not acutely decompensated  - Insults include: Lisinopril 2.5 mg at home stopped at OSH  - Patient had IV lasix 80 bolus at 10:35 am followed by lasix infusion 1 mg/hr till 7:40 pm. Patient had UO of 4.7 liters. Cr has improved reflecting cause of AKI to be cardiorenal.   - Will recommend continued diuresis.     Concern for acute hepatic failure:  Evaluated by hepatology. Patient likely has chronic liver disease due to Hep C. Current liver dysfunction is likely due to hepatic congestion secondary to poor cardiac function.       Thank you for the consult.      [facility]  05/05/2014    Discussed with Consult staff Dr Olga Millers

## 2014-05-05 NOTE — Unmapped (Signed)
Cardiology Consult  Progress Note      Chief Complaint / Reason for Follow-Up     Gabrielle Rivera is a 64 y.o. female on hospital day 2.  The principal reason for today's follow up visit is shock.       Interval History / Subjective     Overnight team was unable to wedge the swan ganz catheter and it was adjusted this AM at bedside. She has had brief runs of unsustained Vtach this AM requiring 1 ATP attempt to control the VTach. Patient pulled out R IJ overnight.       Review of Systems (Focused)     Unable to obtain as patient is intubated and sedated.       Medications     Scheduled Meds:  ??? aspirin  81 mg Oral Daily with breakfast   ??? atorvastatin  10 mg Oral Nightly (2100)   ??? ceFEPime (MAXIPIME) IV extended infusion  2 g Intravenous Q24H   ??? famotidine (PF)  20 mg Intravenous Daily 0900   ??? flu vaccine (seasonal < 65 yr)(PF) IM 0.5 mL  0.5 mL Intramuscular During Hospitalization   ??? heparin (porcine)  5,000 Units Subcutaneous 3 times per day   ??? lactulose  10 g Oral TID   ??? phytonadione (AQUA-MEPHYTON) IVPB  5 mg Intravenous Daily 0900   ??? rifaximin  550 mg Oral BID   ??? senna-docusate  1 tablet Oral BID   ??? vancomycin  15 mg/kg Intravenous Once     Continuous Infusions:  ??? DOBUTamine in D5W 5 mcg/kg/min (05/05/14 0415)   ??? fentanyl (SUBLIMAZE) infusion standard concentration 200 mcg/hr (05/05/14 1008)   ??? ketamine (KETALAR) 500 mg infusion 2.5 mcg/kg/min (05/05/14 1303)   ??? norepinephrine infusion 8 mcg/min (05/05/14 0556)   ??? propofol 10 mcg/kg/min (05/05/14 1333)     PRN Meds:  ondansetron, vancomycin intermittent/pulse dosing       Vital Signs     Temp:  [96.2 ??F (35.7 ??C)-99.3 ??F (37.4 ??C)] 98.9 ??F (37.2 ??C)  Heart Rate:  [87-120] 108  Resp:  [20-26] 22  BP: (91-110)/(57-68) 91/60 mmHg  FiO2:  [40 %-100 %] 40 %    Intake/Output Summary (Last 24 hours) at 05/05/14 1417  Last data filed at 05/05/14 1303   Gross per 24 hour   Intake 1338.8 ml   Output   3575 ml   Net -2236.2 ml         Physical Exam     General  appearance: sedated and intubated  Neck: left IJ in place  Lungs: coarse breath sounds bilaterally  Heart: tachycardic, holosystolic murmur, strong apical impulse; S3 gallop present  Abdomen: soft, non-distended, pulsating  Extremities: skin warm and dry, L radial arterial line in place  Pulses: dorsalis pedis pulses 2+ bilaterally    Laboratory Data         Lab 05/05/14  0441 05/04/14  0504 05/03/14  2359   WBC 7.2 9.2 10.1   HEMOGLOBIN, POC 9.5* 9.0* 9.5*   HEMATOCRIT BLOOD GAS 29.5* 29.0* 29.7*   MCV, POC 78.9* 79.9* 78.8*   PLATELETS, POC 201 217 244           Lab 05/05/14  1139 05/05/14  0441 05/04/14  2336 05/04/14  1240   SODIUM 138 136 136 136   POTASSIUM 3.7 4.0 4.3 4.7   CHLORIDE 104 99 101 100   CARBON DIOXIDE (CO2) 27 27 27 27    BUN 76* 83* 87* 93*  CREATININE 3.62* 3.90* 4.01* 4.53*   GLUCOSE 107* 101* 149* 42*           Lab 05/05/14  1139 05/05/14  0441 05/04/14  2336 05/04/14  1240   CALCIUM 8.5* 9.2 9.2 10.0   MAGNESIUM 2.8* 3.2* 2.6* 3.3*   PHOSPHORUS 3.5 4.2 5.3* 6.6*           Lab 05/05/14  0441 05/03/14  2359   INR POC 1.6* 1.8*   PROTHROMBIN TIME 18.6* 20.1*           Lab 05/04/14  1240 05/04/14  0504 05/04/14  0245 05/03/14  2359   ALT  --   --   --  47   AST  --   --   --  69*   ALK PHOS  --   --   --  73   BILIRUBIN, TOTAL  --   --   --  4.4*   BILIRUBIN, DIRECT  --   --   --  3.10*   ALBUMIN 3.1* 3.1* 3.0* 3.1*   3.1*           Lab 05/04/14  1729 05/04/14  0327   COLOR, URINE, POC Yellow Yellow   CLARITY, URINE, POC Clear Slightly-Cloudy*   SPECIFIC GRAVITY, URINE 1.008 1.009   PH UA 6.0 6.0   PROTEIN UA, POC Negative Negative   GLUCOSE UA, POC Negative Negative   KETONES UA, POC Negative Negative   BILIRUBIN UA, POC Negative Negative   BLOOD UA, POC Moderate* Large*   NITRITE UA Negative Negative   UROBILINOGEN UA, POC <2.0 <2.0   LEUKOCYTES UA, POC Trace* Negative   RBC UA 6* 7*   WBC UA 4 4   BACTERIA Rare* Rare*           Lab 05/04/14  2336 05/03/14  2359   TROPONIN I 1.04* 0.74*        Lab Results   Component Value Date    NTPROBNP 47829* 05/03/2014       No results found for: TSH, T3FREE, FREET4        Lab 05/05/14  0958 05/04/14  2036 05/04/14  1553 05/04/14  1414   GLU MONITORING DEVICE, POC 106* 193* 199* 100           Diagnostic Studies     Hemodynamic measurements via swan ganz @1118 :  CO 4.42 l/min  HR 106 bpm  ABPs 106 mmHg  ABPd 57 mmHg   ABPm 71 mmHg  PAPs 38 mmHg  PAPd 20 mmHg  PAPm 27 mmHg  PAWP 11 mmHg    BSA 1.64 m2  SV 41.7 m1  PVR 289 DS/cm5  LCW 3.6 kg-m  LVSW 34 g-m  RCW 1.62 kg-m  RVSW 15.31 g-m     CI 2.7 l/min/m2  SI 25.4 m1/m2  PVRI 475 DSm2/cm5  LCWI 2.2 kg-m/m2  LVSWI 20.7 g-m/m2  RCWI 0.99 kg-m/m2  RVSWI 9.34 g-m/m2      ECHO: Study Conclusions    - Left ventricle: The cavity size was severely dilated. Wall  ????thickness was increased in a pattern of mild LVH. Systolic  ????function was severely reduced. The estimated ejection fraction was  ????in the range of 15% to 20%. Diffuse hypokinesis with regional  ????variations.  - Mitral valve: Severe regurgitation directed eccentrically and  ????posteriorly.  - Left atrium: The atrium was severely dilated.  - Right ventricle: The cavity size was moderately to severely  ????dilated. Wall thickness was  normal. There is echocardiographic  ????evidence of pulsus alternans in the RVOT suggesting right  ????ventricualr dysfunction. Systolic function was moderately to  ????severely reduced by objective interpretation. TAPSE: 1.2cm.  - Atrial septum: The septum bowed from left to right, consistent  ????with increased left atrial pressure.  - Pulmonary arteries: Systolic pressure was moderately increased, =  ????56mm Hg assuming that the right atrial pressure was 20 mmHg.  - Pericardium, extracardiac: A small, free-flowing pericardial  ????effusion was identified along the right ventricular free wall. The  ????fluid had no internal echoes.There was no evidence of hemodynamic  ????compromise.    X-ray Portable Chest   Final Result   IMPRESSION:      No  interval change in position of left internal jugular approach Swan-Ganz catheter with tip overlying the expected location of left upper lobe pulmonary artery or its branch.      No change in the appearance of the lung fields with left lower lobe collapse and airspace opacity at right base, atelectasis or infiltrate.      Report Verified by: Odie Sera, M.D. at 05/05/2014 10:38 AM      X-ray Portable Chest   Final Result   IMPRESSION:      The PA catheter tip has migrated to a segmental branch of the left upper lobe pulmonary artery, please correlate with waveforms to be sure that it is not constantly wedged.      Otherwise no significant change.      Report Verified by: Rick Duff, M.D. at 05/05/2014 9:20 AM         Assessment & Plan     Gabrielle Rivera is a 64 y.o. female on HD# 2 with shock and Acute respiratory failure with hypoxia and hypercarbia.  The medical issues being addressed in today's encounter are as follows:    Principal Problem:    Acute respiratory failure with hypoxia and hypercarbia  Active Problems:    Hepatitis C    AKI (acute kidney injury)      1. Shock: Cardiac output today was calculated as 6.842 L/min by Fick (SVO2:68). This in conjunction with her physical exam, it appears that she is more stable than yesterday in terms of a heart failure standpoint. Additionally her wedge pressure this morning was 11 mmHg and CVP of 5 mmHG indicating that she is euvolemic close to being dry.  Her venous O2 saturations remain elevated relative to her degree of heart failure, indicating that sepsis or other high cardiac output causes such as thyroid disease and anemia remain possible.   ?????????? -?? Given that hemodynamic numbers it is important to be cautious with diuresis so that she does not now become volume depleted. She is net negative 3.4L since admission and her weight has decreased 7 pounds.  - Gaol fluids even  CVP:8-15 and PCWP<14-16  swan numbers Q8H.          - Titrate inotropes based on swan  numbers - slowly wean off dobutamine if possible  - Continue labs Q 8 hours to monitor renal fx.  - Could consider adding Vasopressin 0.04 to help to wean off levophed  Gaol Map>60  ?????????? - Monitor strict intake/output and daily weight  ???????? - Keep K>4 and Mg>2.0  ???????? - Treat the septic shock per primary team - on vanc and cefepime    2. Troponinemia: Troponin increased this AM to 1.04 from 0.74 on admission to [facility]. Pt had chronically elevated Trop based on outside record. There is no  ECG changes suggestive of ACS. No need for heparin gtt. She has elevated INR which puts her at increased risk of bleeding.  - Continue ASA, atorvastatin    3. Disposition: Pt has poor prognosis. Discuss with family goals of care.      Thank you for the consult. Seen and discussed with cardiology attending Dr. Tiburcio Pea. ??         Thanks for the consult. Please call with questions.        Derrill Memo, MS  Lonna Duval, MD  Cardiology fellow

## 2014-05-05 NOTE — Unmapped (Signed)
MICU PROGRESS NOTE    05/05/2014 4:15 PM Patient: Gabrielle Rivera LOS: 2 days     Significant events over the past 24 hours include:  -overnight pt woke up and pulled out R IJ CVC, MAP to 40s, switched meds to swan ganz then pressures improved.   -per Cardiology this morning, pt's swan numbers actually suggest compensated heart failure, thus likely other component like sepsis driving vital abnormalities    DAILY PLAN  -add low dose ketamine to try to wean propofol. It could be possible the propofol could be contributing to the need for pressors. Withdraw ketamine if sympathetic surges occur ie tachycardia  -f/u panculture  -continue levophed and dobutamine      ===========================================================  ASSESSMENT    63yo F HFrEF EF 15-20%, CAD s/p DES, CVA, HTN, HLD, CKD stage 3 transferred from [facility] 05/03/14 w/ concerns for fulminant hepatic failure, found to actually be in cardiogenic shock with Biventricular heart failure and likely sepsis    Neurologic  Intubated and sedated w/ propofol and fentanyl gtt    Hx CVA  - asa and statin    Pulmonary  Intubated to protect airway given history of encephalopathy from OSH  -sedated w/ propofol and fentanyl gtt    Cardiovascular   Shock: Septic +/- Cardiogenic shock in setting of HFrEF - EF 15-20%. Initial swan reports concerning for both cardiogenic and septic shock however evaluation of later swan numbers suggested heart was compensated. Complicated by congestive hepatopathy and AKI.   -continue levophed and dobutamine.   -add low dose ketamine to try to wean propofol. It could be possible the propofol could be contributing to the need for pressors. Withdraw ketamine if sympathetic surges occur ie tachycardia  -check Scvo2  -daily weights, strict I/O. Foley in place  -Vancomycin/cefepime. pancultured  -hold off on fluids given cardiac issue. Pt diuresing on own well likely as a result of ATN  -RHC 05/04/14  RA pressure?? 9  RV pressure?? 52/9  PA  pressure 55/28?? , PA mean 38  PCWP?? 25  RA sat?? 77%  RA Hgb?? 9.5  PA sat?? 77%  PA Hgb?? 8.9    ????????????????????????Fick  CO???????????? 7.1  CI???????????????? 4.3????????????  SVR????????831  PVR????????146        Gastrointestinal  Congestive hepatopathy  -likely a result of heart failure and sepsis  -U/S abdomen ordered  -Liver agrees likely 2/2 heart failure plus sepsis. Rec checking HCV RNA  -3day vit k challenge    HCV Ab + : check HCV RNA quant    Chronic acalculous cholecystitis - HIDA scan w/ gallbladder wall thickening. No pericholecystic fluid collections    GI ppx - pt will be intubated >2 days. pepcid bid IV    Renal  AKI - likely related to cardiac dysfunction and sepsis. Cardiology rec q8h BMP. Renal following pt. Pt has been consented for vas cath if needed by son, however due to improvement may not be needed.   -monitor uremia    Acid/Base   Fine on arrival. Recheck ABG    Genito-Urinary  Foley in place. Autodiuresing, likely component of ATN. No need to intervene at this time    Infectious Disease  Septic shock   -levophed. Vancomycin/cefepime. pancultured  -hold off on fluids given cardiogenic shock    Hep C ab +: check HCV RNA    Endocrine  Hyperglycemia - keep glucose ~140-180    Hematology/Oncology  Coagulopathy - INR on arrival elevated - likely 2/2 congestive hepatopathy. plt dysfunction  from uremia  -Vit K challenge x3days    Psychiatry  See neuro    Fluid/Electrolytes/Nutrition   Intravascular volume assessment: volume up  IVF: avoid  Electrolyte Abnormalities: replete. Keep watch on Mg ~2 and K ~4  Nutrition: npo    Prophylaxis  DVT Prophylaxis: sqh  GI Prophylaxis: pepcid bid iv    Code Status Full Code    PT/OT/Reanimation    Family Communication  D/w son poor prognosis, he wants aggressive measures    Social Work    Disposition  MICU    ===========================================================  CODE STATUS: Full Code     ALLERGIES:No Known Allergies    Lines/Tubes (day):   ETT 05/03/14   Aline 05/04/14   Central lines  05/04/14   Foley 05/03/14       CAM: Overall CAM-ICU : Delirium Present  RASS: [place] Agitation Sedation Scale: -3    DRIPS   Vasopressors: levo   Vasodilators:   Inotropes: dobutamine   Other: propofol and fentanyl, ketamine    INs & OUTs       Intake/Output Summary (Last 24 hours) at 05/05/14 1615  Last data filed at 05/05/14 1303   Gross per 24 hour   Intake 1290.1 ml   Output   2520 ml   Net -1229.9 ml       VENTILATION   Vent Mode:  [-] VC-AC/PRVC  FiO2:  [40 %-100 %] 100 %  S RR:  [16-22] 16  S VT:  [350 mL] 350 mL  PEEP/CPAP:  [5 cm H20] 5 cm H20      Lab Results   Component Value Date    PHART 7.44 05/05/2014    PCO2 38 05/05/2014    PO2ART 150* 05/05/2014    HCO3ART 25 05/05/2014    BEART 1.0 05/05/2014    HBO2PER 93.9* 05/05/2014       VITALS   Temp (24hrs), Avg:98.2 ??F (36.8 ??C), Min:96.2 ??F (35.7 ??C), Max:99.3 ??F (37.4 ??C)    Patient Vitals for the past 4 hrs:   Pulse Resp SpO2   05/05/14 1542 124 - 100 %   05/05/14 1500 122 16 100 %   05/05/14 1400 112 16 100 %   05/05/14 1349 - - 100 %   05/05/14 1300 119 16 100 %       PHYSICAL EXAM  General: sedated/intubated  Neurologic: difficult assessing 2/2 sedated/intubated.   Pulmonary: bilateral coarse breath sounds  Cardiovascular: tachycardic. Systolic murmur. jvp+  Abdominal: soft, nt/nd, no masses  Extremities: trace pedal edema bilat  Other:     ANTIBIOTICS  Vanc/cefepime 05/04/14    NUTRITION  Npo    LABS   Lab Results   Component Value Date    GLUCOSE 107* 05/05/2014    BUN 76* 05/05/2014    CREATININE 3.62* 05/05/2014    K 3.7 05/05/2014    NA 138 05/05/2014    CL 104 05/05/2014    CALCIUM 8.5* 05/05/2014      Lab Results   Component Value Date    MG 2.8* 05/05/2014      Lab Results   Component Value Date    PHOS 3.5 05/05/2014     Lab Results   Component Value Date    ALKPHOS 73 05/03/2014    ALT 47 05/03/2014    AST 69* 05/03/2014    BILITOT 4.4* 05/03/2014    ALBUMIN 3.1* 05/04/2014    BILIDIRECT 3.10* 05/03/2014    PROT 7.1 05/03/2014  Lab Results    Component Value Date    WBC 7.2 05/05/2014    HGB 9.5* 05/05/2014    HCT 29.5* 05/05/2014    MCV 78.9* 05/05/2014    PLT 201 05/05/2014     No results found for: ANTIXAF No results found for: PTT   Lab Results   Component Value Date    INR 1.6* 05/05/2014    INR 1.8* 05/03/2014    PROTIME 18.6* 05/05/2014    PROTIME 20.1* 05/03/2014    No results found for: FIBRINOGEN No results found for: TEGANGLE, TEGKTIME, TEGLYSIS30, TEGMAXAMPL, TEGRTIME, CBMZ  FSBS Range: No results found for: GLUFAST    MICROBIOLOGY  05/04/14 bcx peripheral x2  05/04/14 bcx line  05/04/14 ucx    ===========================================================    Demetrie Borge, MD  4:15 PM

## 2014-05-05 NOTE — Unmapped (Signed)
Problem: Compromised Skin Integrity  Use this problem when pressure ulcers are classified as stage I or II.  Goal: Skin integrity is maintained or improved  Assess and monitor skin integrity. Identify patients at risk for skin breakdown on admission and per policy. Collaborate with interdisciplinary team and initiate plans and interventions as needed.  Pt turned q2 hours with pillow support. Pt monitored frequently for incontinence and moisture. Will continue to monitor.

## 2014-05-05 NOTE — Unmapped (Signed)
Problem: Non-violent, non-self-destructive restraints  Less restrictive alternative interventions will be considered prior to application of restraint devices.   Goal: Patient will be restrained only to maintain safety  Alternatives to restraints can include; moving the patient closer to the nurses station, video monitoring where available, medicating for pain, agitation or psychosis, psychosocial interventions, manipulation of environment, frequent toileting, diversional activities, bed alarms, having family members sit with patient, frequent reorientation, or repositioning.   Pt in BSW. Pt unable to follow commands and is agitated at times. Pt has ett, swan, and cvc. Pt turned q2 hours. Will continue to monitor.

## 2014-05-05 NOTE — Unmapped (Signed)
Problem: Compromised Skin Integrity  Use this problem when pressure ulcers are classified as stage I or II.   Goal: Skin integrity is maintained or improved  Assess and monitor skin integrity. Identify patients at risk for skin breakdown on admission and per policy. Collaborate with interdisciplinary team and initiate plans and interventions as needed.   Outcome: Progressing  Patient is at risk for skin breakdown. Patient has a low Braden score (12).  Currently in bilateral soft wrist restraints in order to maintain patient safety.   Patient is on a Q two hour turn schedule in order to prevent skin breakdown.   Will continue to monitor skin for any signs of injury or increased risk for injury to the patient.     -Antasia Haider, RN

## 2014-05-05 NOTE — Unmapped (Signed)
Problem: Non-violent restraint safety  Goal: Patient will be free from injury during restraint period  Assess and monitor for unsafe behaviors that can include unintentional harm of self or others, risk for joint dislocation related to post-operative status, risks for potential nutrition/hydration issues related to removing/tampering with medical equipment, protection of medical procedures, or protection of medical device access.   Outcome: Progressing  Pt intubated, on sedation, and has periods of agitation in which pt reaches for ET tube despite verbal intervention.   Currently in bilateral soft wrist restraints, will continued for protection of airway and to directly promote medical healing.   Will continue to monitor for any signs of injury and assess for discontinuation criteria.    Sakib Noguez, RN

## 2014-05-06 LAB — RENAL FUNCTION PANEL W/EGFR
Albumin: 2.6 g/dL — ABNORMAL LOW (ref 3.5–5.7)
Anion Gap: 6 mmol/L (ref 3–16)
BUN: 61 mg/dL — ABNORMAL HIGH (ref 7–25)
CO2: 27 mmol/L (ref 21–33)
Calcium: 8.3 mg/dL — ABNORMAL LOW (ref 8.6–10.3)
Chloride: 107 mmol/L (ref 98–110)
Creatinine: 2.76 mg/dL — ABNORMAL HIGH (ref 0.60–1.30)
GFR MDRD Af Amer: 21 See note.
GFR MDRD Non Af Amer: 17 See note.
Glucose: 92 mg/dL (ref 70–100)
Osmolality, Calculated: 307 mosm/kg — ABNORMAL HIGH (ref 278–305)
Phosphorus: 3.5 mg/dL (ref 2.1–4.7)
Potassium: 4 mmol/L (ref 3.5–5.3)
Sodium: 140 mmol/L (ref 133–146)

## 2014-05-06 LAB — BASIC METABOLIC PANEL
Anion Gap: 5 mmol/L (ref 3–16)
BUN: 50 mg/dL (ref 7–25)
CO2: 28 mmol/L (ref 21–33)
Calcium: 8.6 mg/dL (ref 8.6–10.3)
Chloride: 109 mmol/L (ref 98–110)
Creatinine: 2.41 mg/dL (ref 0.60–1.30)
GFR MDRD Af Amer: 25 See note.
GFR MDRD Non Af Amer: 20 See note.
Glucose: 109 mg/dL (ref 70–100)
Osmolality, Calculated: 308 mOsm/kg (ref 278–305)
Potassium: 4 mmol/L (ref 3.5–5.3)
Sodium: 142 mmol/L (ref 133–146)

## 2014-05-06 LAB — VENOUS BLOOD GAS, LINE/SYRINGE
%HBO2-Line Draw: 62 % (ref 40.0–70.0)
Base Excess-Line Draw: 4.4 mmol/L (ref <=3.0)
CO2 Content-Line Draw: 32 mmol/L (ref 25–29)
Carboxyhgb-Line Draw: 2.1 % (ref 0.0–2.0)
HCO3-Line Draw: 30 mmol/L (ref 24–28)
Methemoglobin-Line Draw: 0.8 % (ref 0.0–1.5)
PCO2-Line Draw: 53 mm Hg (ref 41–51)
PH-Line Draw: 7.37 (ref 7.32–7.42)
PO2-Line Draw: 38 mm Hg (ref 25–40)
Reduced Hemoglobin-Line Draw: 35.1 % (ref 0.0–5.0)

## 2014-05-06 LAB — PROTIME-INR
INR: 1.4 — ABNORMAL HIGH (ref 0.9–1.1)
Protime: 16.4 s — ABNORMAL HIGH (ref 11.6–14.4)

## 2014-05-06 LAB — MAGNESIUM
Magnesium: 2.6 mg/dL — ABNORMAL HIGH (ref 1.5–2.5)
Magnesium: 2.7 mg/dL (ref 1.5–2.5)

## 2014-05-06 LAB — BLOOD GAS, ARTERIAL
%HBO2, Arterial: 90.9 % (ref 95.0–98.0)
%HBO2, Arterial: 91.9 % — ABNORMAL LOW (ref 95.0–98.0)
Base Excess, Arterial: 3.6 mmol/L (ref <=3.0)
Base Excess, Arterial: 2.6 mmol/L (ref <=3.0)
CO2 Content,Arterial: 30 mmol/L (ref 23–27)
CO2 Content,Arterial: 29 mmol/L — ABNORMAL HIGH (ref 23–27)
Carboxyhemoglobin, Arterial: 3.1 %
Carboxyhemoglobin, Arterial: 3 %
HCO3, Arterial: 28 mmol/L (ref 22–26)
HCO3, Arterial: 28 mmol/L — ABNORMAL HIGH (ref 22–26)
Methemoglobin, Arterial: 1.7 % (ref 0.0–1.5)
Methemoglobin, Arterial: 2 % — ABNORMAL HIGH (ref 0.0–1.5)
Reduced hemoglobin, Arterial: 4.3 % (ref 0.0–5.0)
Reduced hemoglobin, Arterial: 3.1 % (ref 0.0–5.0)
pCO2, Arterial: 44 mm Hg (ref 35–45)
pCO2, Arterial: 46 mmHg — ABNORMAL HIGH (ref 35–45)
pH, Arterial: 7.42 (ref 7.35–7.45)
pH, Arterial: 7.39 (ref 7.35–7.45)
pO2, Arterial: 85 mm Hg (ref 80–100)
pO2, Arterial: 101 mmHg — ABNORMAL HIGH (ref 80–100)

## 2014-05-06 LAB — CBC
Hematocrit: 29.3 % (ref 35.0–45.0)
Hemoglobin: 9.1 g/dL (ref 11.7–15.5)
MCH: 25 pg (ref 27.0–33.0)
MCHC: 31.1 g/dL (ref 32.0–36.0)
MCV: 80.3 fL (ref 80.0–100.0)
MPV: 7.5 fL (ref 7.5–11.5)
Platelets: 160 10*3/uL (ref 140–400)
RBC: 3.66 10*6/uL (ref 3.80–5.10)
RDW: 18.7 % (ref 11.0–15.0)
WBC: 6.3 10*3/uL (ref 3.8–10.8)

## 2014-05-06 LAB — PHOSPHORUS: Phosphorus: 3 mg/dL (ref 2.1–4.7)

## 2014-05-06 LAB — VANCOMYCIN, RANDOM: Vancomycin Random: 17.2 ug/mL

## 2014-05-06 LAB — LACTIC ACID: Lactate: 0.4 mmol/L — ABNORMAL LOW (ref 0.5–2.2)

## 2014-05-06 MED ORDER — vancomycin (VANCOCIN) 1,000 mg in sodium chloride 0.9 % 100 mL IVPB
1000 | Freq: Once | INTRAVENOUS | Status: AC
Start: 2014-05-06 — End: 2014-05-06
  Administered 2014-05-06: 19:00:00 1000 mg via INTRAVENOUS

## 2014-05-06 MED ORDER — furosemide (LASIX) injection 40 mg
10 | Freq: Every day | INTRAMUSCULAR | Status: AC
Start: 2014-05-06 — End: 2014-05-08
  Administered 2014-05-07 – 2014-05-08 (×3): 40 mg via INTRAVENOUS

## 2014-05-06 MED ORDER — dexmedetomidine (PRECEDEX) 400 mcg/100 mL NS (4 mcg/ml) IV infusion
400 | INTRAVENOUS | Status: AC
Start: 2014-05-06 — End: 2014-05-06

## 2014-05-06 MED FILL — HEPARIN (PORCINE) 5,000 UNIT/ML INJECTION SOLUTION: 5000 5,000 unit/mL | INTRAMUSCULAR | Qty: 1

## 2014-05-06 MED FILL — LACTULOSE 10 GRAM/15 ML (15 ML) ORAL SOLUTION: 10 10 gram/15 mL (15 mL) | ORAL | Qty: 15

## 2014-05-06 MED FILL — FUROSEMIDE 10 MG/ML INJECTION SOLUTION: 10 10 mg/mL | INTRAMUSCULAR | Qty: 4

## 2014-05-06 MED FILL — XIFAXAN 550 MG TABLET: 550 550 mg | ORAL | Qty: 1

## 2014-05-06 MED FILL — FENTANYL (PF) 50 MCG/ML INJECTION SOLUTION: 50 50 mcg/mL | INTRAMUSCULAR | Qty: 40

## 2014-05-06 MED FILL — PRECEDEX 400 MCG/100 ML (4 MCG/ML) IN 0.9% SODIUM CHLORIDE IV SOLUTION: 400 400 mcg/100 mL (4 mcg/mL) | INTRAVENOUS | Qty: 100

## 2014-05-06 MED FILL — VITAMIN K1 10 MG/ML INJECTION SOLUTION: 10 10 mg/mL | INTRAMUSCULAR | Qty: 0.5

## 2014-05-06 MED FILL — ASPIRIN 81 MG CHEWABLE TABLET: 81 81 MG | ORAL | Qty: 1

## 2014-05-06 MED FILL — VANCOMYCIN 1,000 MG INTRAVENOUS INJECTION: 1000 1000 mg | INTRAVENOUS | Qty: 1000

## 2014-05-06 MED FILL — MILRINONE 20 MG/100 ML(200 MCG/ML) IN 5 % DEXTROSE INTRAVENOUS PIGGYBK: 20 20 mg/100 mL (200 mcg/mL) | INTRAVENOUS | Qty: 100

## 2014-05-06 MED FILL — LEVOPHED 1 MG/ML INTRAVENOUS SOLUTION: 1 1 mg/mL | INTRAVENOUS | Qty: 16

## 2014-05-06 MED FILL — FAMOTIDINE (PF) 20 MG/2 ML INTRAVENOUS SOLUTION: 20 20 mg/2 mL | INTRAVENOUS | Qty: 2

## 2014-05-06 MED FILL — CEFEPIME 2 GRAM/100 ML IN DEXTROSE (ISO-OSMOTIC) INTRAVENOUS PIGGYBACK: 2 2 gram/100 mL | INTRAVENOUS | Qty: 100

## 2014-05-06 MED FILL — SENNA-S 8.6 MG-50 MG TABLET: 8.6-50 8.6-50 mg | ORAL | Qty: 1

## 2014-05-06 NOTE — Unmapped (Addendum)
[facility]  Medical Nutrition Therapy    Reason(s) for Completion: Physician/Nursing Referral    Diet Order/Nutrition Support: Novasource Renal @ 40 ml/hr (1,920 kcal, 87g pro, 176g CHO, 688 ml free water)     Pertinent Information: Patient is a 64 y.o. 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 as transfer from [facility] for continued evaluation of AMS/renal failure/liver dysfunction. Nutrition consult received re: initiation of enteral nutrition. Recommend to reduce goal rate of Novasource Renal to 33 ml/hr as current goal rate will provide excess kcal beyond estimated needs.     Patient Active Problem List   Diagnosis   ??? Acute respiratory failure with hypoxia and hypercarbia   ??? Hepatitis C   ??? AKI (acute kidney injury)     Past Medical History   Diagnosis Date   ??? CHF (congestive heart failure)    ??? Asthma    ??? Hypertension    ??? Hyperlipemia    ??? Stroke 2014   ??? Hepatitis C        Scheduled Meds:   ??? aspirin  81 mg Oral Daily with breakfast   ??? atorvastatin  10 mg Oral Nightly (2100)   ??? ceFEPime (MAXIPIME) IV extended infusion  2 g Intravenous Q24H   ??? famotidine (PF)  20 mg Intravenous Daily 0900   ??? flu vaccine (seasonal < 65 yr)(PF) IM 0.5 mL  0.5 mL Intramuscular During Hospitalization   ??? heparin (porcine)  5,000 Units Subcutaneous 3 times per day   ??? lactulose  10 g Oral TID   ??? rifaximin  550 mg Oral BID   ??? senna-docusate  1 tablet Oral BID   ??? vancomycin (VANCOCIN) IVPB - CENTRAL LINE  1,000 mg Intravenous Once      Continuous Infusions:  ??? dexmedetomidine (PRECEDEX) 400 mcg in    100 mL 0.9% NaCl (4 mcg/mL) IV Infusion Stopped (05/06/14 1026)   ??? fentanyl (SUBLIMAZE) infusion standard concentration 50 mcg/hr (05/06/14 0919)   ??? ketamine (KETALAR) 500 mg infusion Stopped (05/06/14 0919)   ??? milrinone 0.25 mcg/kg/min (05/06/14 0142)   ??? norepinephrine infusion Stopped (05/06/14 0849)       PRN Meds:ondansetron, vancomycin intermittent/pulse dosing     Pertinent Labs:   Lab Results   Component Value Date    CREATININE 2.76* 05/06/2014    BUN 61* 05/06/2014    NA 140 05/06/2014    K 4.0 05/06/2014    CL 107 05/06/2014    CO2 27 05/06/2014     Lab Results   Component Value Date    CALCIUM 8.3* 05/06/2014    PHOS 3.5 05/06/2014     Lab Results   Component Value Date    MG 2.6* 05/06/2014     Lab Results   Component Value Date    GLUCOSE 92 05/06/2014     Lab Results   Component Value Date    WBC 6.3 05/06/2014     Lab Results   Component Value Date    ALBUMIN 2.6* 05/06/2014     No results found for: PREALBUMIN  No results found for: CRP    Temp (24hrs), Avg:99.3 ??F (37.4 ??C), Min:98.8 ??F (37.1 ??C), Max:99.9 ??F (37.7 ??C)    Vent Mode:  [-] Spont PS/PEEP  FiO2:  [40 %] 40 %  S RR:  [16] 16  S VT:  [350 mL] 350 mL  PEEP/CPAP:  [  5 cm H20] 5 cm H20    Skin Integrity: Braden Scale Score 12    Potential Nutrition Related Factor(s):  Ventilation/Sedation  Food Allergies/Intolerances: NKFA  Cultural Requests: None noted    64 y.o.   Female   Ht Readings from Last 1 Encounters:   05/03/14 5' 4 (1.626 m)     Wt Readings from Last 20 Encounters:   05/06/14 127 lb 6.8 oz (57.8 kg)      Body mass index is 21.86 kg/(m^2).   Usual Weight: Unknown  Weight History: Unknown     Nutrition Related Problems:   Nutrition Diagnosis: Inadequate energy intake  Related To: ventilation/sedation  As Evidenced By: need for enteral nutrition    Estimated Nutrition Needs:   Kcals/day: 1,604 kcal [place] Guard)   Protein g/day: 58-69 g (1-1.2 g/kg)   Fluid ml/day: 1 ml/kcal   Needs Based On: 57.8 kg - actual body weight     Recommended Interventions: Modify Enteral Nutrition    Goals:Tolerate enteral nutritional within 2-3 days    Nutrition Status Classification: Severely Compromised    Follow up in 3-4 days.    Recommendation(s) to Physician:   ?? Novasource Renal @ 33 ml/hr (1,584 kcal, 72g pro, 145g CHO, 568 ml free water)      Rachel Bo MS, RD, LD  Pager [phone]

## 2014-05-06 NOTE — Unmapped (Signed)
This note also relates to the following rows which could not be included:  pH - Cannot attach notes to extension rows  PCO2 - Cannot attach notes to extension rows  PO2 - Cannot attach notes to extension rows  HCO3 - Cannot attach notes to extension rows  Base Excess - Cannot attach notes to extension rows         05/06/14 0918   Vent Information   Vent Mode Spont PS/PEEP   FiO2 (set) 40   Vent Settings/Readings   Resp Rate (Actual) 7   Vt Exp 647 mL   Ve 5.5 L/min   Minute Volume Spont 5.5   PIP Observed 7 cm H2O   Mean Airway Pressure 5 cmH20   PEEP/CPAP 5 cm H20   Tube Compensation % 0 %   Insp Rise Time (%) 0 %   Trigger Sensitivity Flow (L/min) 2 L/min   Total RSBI 11   Pulse Oximetry/ETCO2   SpO2 97 %   Adult Ventilator Weaning   Wean Trial Start Time [phone]   Weaning Stop Time 0918     SBT performed.    Respirations: Nonlabored  Cough: Strong  Gag: Present  Secretions:             Oral: Moderate/Large, thin, clear      Pulmonary: Moderate, thin, white  Follows commands: No

## 2014-05-06 NOTE — Unmapped (Signed)
Problem: Non-violent, non-self-destructive restraints  Less restrictive alternative interventions will be considered prior to application of restraint devices.   Goal: Patient will be restrained only to maintain safety  Alternatives to restraints can include; moving the patient closer to the nurses station, video monitoring where available, medicating for pain, agitation or psychosis, psychosocial interventions, manipulation of environment, frequent toileting, diversional activities, bed alarms, having family members sit with patient, frequent reorientation, or repositioning.   Pt remains in BSW. Pt has been extubated although is confused and unable to follow commands at this time. Pt is agitated at times. Pt has cordis and a line in place. Pt in restraints to maintain safety. Will continue to monitor.

## 2014-05-06 NOTE — Unmapped (Signed)
Problem: Compromised Skin Integrity  Use this problem when pressure ulcers are classified as stage I or II.   Goal: Skin integrity is maintained or improved  Assess and monitor skin integrity. Identify patients at risk for skin breakdown on admission and per policy. Collaborate with interdisciplinary team and initiate plans and interventions as needed.   Pt turned q2 hours. Pt monitored frequently for incontinence and moisture. mepilex border in place on coccyx as preventative measure. Will continue to monitor.

## 2014-05-06 NOTE — Unmapped (Signed)
Problem: Compromised Skin Integrity  Use this problem when pressure ulcers are classified as stage I or II.   Goal: Skin integrity is maintained or improved  Assess and monitor skin integrity. Identify patients at risk for skin breakdown on admission and per policy. Collaborate with interdisciplinary team and initiate plans and interventions as needed.   Outcome: Progressing  Patient is at risk for skin breakdown. Patient has a low Braden score (12).  Currently in bilateral soft wrist restraints in order to maintain patient safety.   Patient is on a Q two hour turn schedule in order to prevent skin breakdown.   Will continue to monitor skin for any signs of injury or increased risk for injury to the patient.     -Kamron Portee, RN

## 2014-05-06 NOTE — Unmapped (Signed)
Problem: Non-violent restraint safety  Goal: Patient will be free from injury during restraint period  Assess and monitor for unsafe behaviors that can include unintentional harm of self or others, risk for joint dislocation related to post-operative status, risks for potential nutrition/hydration issues related to removing/tampering with medical equipment, protection of medical procedures, or protection of medical device access.   Outcome: Progressing  Pt intubated, on sedation, and has periods of agitation in which pt reaches for ET tube despite verbal intervention.   Currently in bilateral soft wrist restraints, will continued for protection of airway and to directly promote medical healing.   Will continue to monitor for any signs of injury and assess for discontinuation criteria.    Astryd Pearcy, RN

## 2014-05-06 NOTE — Unmapped (Signed)
ICU ATTENDING PROGRESS NOTE:    LOS: hospital day 3     CRITICAL CARE TIME:  TOTAL CRITICAL CARE TIME caring for this patient with life threatening, unstable organ failure, including direct patient contact, management of life support systems, review of data including imaging and labs, discussion with other team members and physicians and excluding any separately billed procedures, 46 minutes today.    I independently examined Gabrielle Rivera.  24 hour events, radiographs and labs were reviewed.  The case has been discussed in detail on multidisciplinary critical care rounds.  I agree with the assessment and plan as outlined on the team's daily note. The overall plan of care is as follows:     ASSESSMENT:  ?? Shock - mixed etiology based on clinical presentation and PA cath numbers.?? Some suggestion that septic etiology, but patient does not appear overtly infected.?? Certainly has depressed EF, but PA cath numbers support reasonable CO/CI (but not sure numbers valid in current state).  Overnight levophed and dobutamine were discontinued, then milrinone initiated at low dose; overall no notable change to clinical status.  ?? Culture negative septic shock - no fulminant infectious s/s on initial evaluation and cultures have been unimpressive.  WBC count has decreased and overall clinical picture improved since admission w/ better liver/kidney function.  ?? Encephalopathy - uncertain etiology w/ negative meatbolic w/u and head imaging.  Able to open eyes on command, but not following instructions.   ?? Hyperbili/elevated INR?? - suspect hepatic congestion from heart failure  ?? AKI on CKD - improving Cr.?? Good UOP.  ?? GB wall thickening  ?? Anemia  ?? Hep C +  ?? PMH: HFrEF EF 15-20%, CAD s/p DES, CVA, HTN, HLD and stage III CKD    DAILY PLAN:   ?? Decrease sedation, combine w/ SBT.  If able, will consider extubation.  If not, may start precedex for sedation.  Likely discontinue ketamine as may have clouded mental status  assessment.  ?? Lung protective ventilation.  ?? Discontinue PA cath given placement on CXR and ventricular ectopy  ?? Wean milrinone to off  ?? Cont ASA and statin  ?? Cont broad spectrum ABX which were started empirically for 7d course, f/u cultures  ?? VTE and GI prophy    Early Osmond, DO  Attending Physician  Division of Pulmonary and Critical Care Medicine  05/06/2014  3:15 PM

## 2014-05-06 NOTE — Unmapped (Signed)
[facility] Clinical Pharmacy Service: Vancomycin Consult    Gabrielle Rivera is a 64 y.o. female currently being treated for presumed sepsis.    Patient has No Known Allergies.    Pharmacy consulted for vancomycin management by Avon Gully, MD.     Current regimen is:  Vancomycin - pulse dosed based on random levels  Cefepime 2 grams IV q24h      Objective Data     Wt Readings from Last 1 Encounters:   05/06/14 127 lb 6.8 oz (57.8 kg)     Ht Readings from Last 1 Encounters:   05/03/14 5' 4 (1.626 m)     Temp:  [98.8 ??F (37.1 ??C)-99.5 ??F (37.5 ??C)] 99 ??F (37.2 ??C)  Heart Rate:  [102-128] 125  Resp:  [16-22] 17  BP: (91)/(54) 91/54 mmHg  Arterial Line BP: (90-108)/(51-64) 90/54 mmHg  FiO2:  [40 %-100 %] 40 %  I/O last 3 completed shifts:  In: 2879 [I.V.:2360; NG/GT:519]  Out: 2775 [Urine:2775]      Lab 05/06/14  0204 05/05/14  1139 05/05/14  0441 05/04/14  2336  05/04/14  0504  05/03/14  2359   WBC 6.3  --  7.2  --   --  9.2  --  10.1   BUN 61* 76* 83* 87*  < > 95*  < > 93*   CREATININE 2.76* 3.62* 3.90* 4.01*  < > 4.60*  < > 4.23*   < > = values in this interval not displayed.    Cultures     All cultures are NGTD    Vancomycin Concentrations     Recent Labs      05/05/14   0441  05/06/14   0526   VANCORANDOM  15.5  17.2     Assessment and Plan     ?? Vancomycin concentration of 17.2 mg/L this am.  ?? Goal vancomycin trough of 15-20 mg/L  ?? Recommend redosing with 1 gram IV vancomycin x1, and check vancomycin serum concentration with am labs on Saturday, 2/6.  ?? Continue to pulse dose vancomycin   ?? Pharmacy will continue to monitor therapy for efficacy and toxicity.    Grace Blight, Pharm.D.  Clinical Pharmacy Specialist, Critical Care  Pager: [phone]  [phone]  On-call pager: [phone]  05/06/2014 9:59 AM

## 2014-05-06 NOTE — Unmapped (Signed)
MICU PROGRESS NOTE    05/06/2014 7:59 AM Patient: Gabrielle Rivera LOS: 3 days     Significant events over the past 24 hours include:  -dobutamine off, then replaced w/ milrinone    DAILY PLAN  -add low dose ketamine to try to wean propofol. It could be possible the propofol could be contributing to the need for pressors. Withdraw ketamine if sympathetic surges occur ie tachycardia  -f/u panculture  -continue levophed and dobutamine  -remove swan as numbers dont really correlate  -trial sbt if less sedated      ===========================================================  ASSESSMENT    64yo F HFrEF EF 15-20%, CAD s/p DES, CVA, HTN, HLD, CKD stage 3 transferred from [facility] 05/03/14 w/ concerns for fulminant hepatic failure, found to actually be in cardiogenic shock with Biventricular heart failure and likely sepsis    Neurologic  Intubated and sedated w/ ketamine and fentanyl gtt. Consider changing ketamine to precedex as pt wasn't following instructions well this morning. Ketamine can also cause increased secretions which was noted today. Would like to try weaning sedation for potential extubation  -CT head osh neg    Hx CVA  - asa and statin    Pulmonary  Intubated to protect airway given history of encephalopathy from OSH  -try weaning sedation.   -SBT today    Cardiovascular   Shock: Septic +/- Cardiogenic shock in setting of HFrEF - EF 15-20%. Initial swan reports concerning for both cardiogenic and septic shock however evaluation of later swan numbers suggested heart was compensated. Complicated by congestive hepatopathy and AKI.   -continue levophed and dobutamine.   -add low dose ketamine to try to wean propofol. It could be possible the propofol could be contributing to the need for pressors. Withdraw ketamine if sympathetic surges occur ie tachycardia. Remove swan  -check Scvo2  -daily weights, strict I/O. Foley in place  -Vancomycin/cefepime 7 day therapy empirically. pancultured neg  -hold off on  fluids given cardiac issue. Pt diuresing on own well likely as a result of ATN  -RHC 05/04/14  RA pressure?? 9  RV pressure?? 52/9  PA pressure 55/28?? , PA mean 38  PCWP?? 25  RA sat?? 77%  RA Hgb?? 9.5  PA sat?? 77%  PA Hgb?? 8.9    ????????????????????????Fick  CO???????????? 7.1  CI???????????????? 4.3????????????  SVR????????831  PVR????????146        Gastrointestinal  Congestive hepatopathy  -likely a result of heart failure and sepsis  -U/S abdomen ordered  -Liver agrees likely 2/2 heart failure plus sepsis. Rec checking HCV RNA  -3day vit k challenge    HCV Ab + : check HCV RNA quant    Chronic acalculous cholecystitis - HIDA scan w/ gallbladder wall thickening. No pericholecystic fluid collections    GI ppx - pt will be intubated >2 days. pepcid bid IV    Renal  AKI - likely related to cardiac dysfunction and sepsis. Cardiology rec q8h BMP. Renal following pt. Pt has been consented for vas cath if needed by son, however due to improvement may not be needed.   -monitor uremia  -pt likely autodiuresing now from ATN    Acid/Base   Fine on arrival. Recheck ABG    Genito-Urinary  Foley in place. Autodiuresing, likely component of ATN. No need to intervene at this time    Infectious Disease  Septic shock   -levophed. Vancomycin/cefepime 7 day therapy empirically. pancultured neg  -hold off on fluids given cardiogenic shock    Hep C  ab +: check HCV RNA    Endocrine  Hyperglycemia - keep glucose ~140-180    Hematology/Oncology  Coagulopathy - INR on arrival elevated - likely 2/2 congestive hepatopathy plus likely malnutrition. plt dysfunction from uremia  -Vit K challenge x3days improved INR    Psychiatry  See neuro    Fluid/Electrolytes/Nutrition   Intravascular volume assessment: volume up, but autodiuresing  IVF: avoid  Electrolyte Abnormalities: replete. Keep watch on Mg ~2 and K ~4  Nutrition: npo    Prophylaxis  DVT Prophylaxis: sqh  GI Prophylaxis: pepcid bid iv    Code Status Full Code    PT/OT/Reanimation    Family Communication  D/w son, states pt would  want aggressive measures    Social Work    Disposition  MICU    ===========================================================  CODE STATUS: Full Code     ALLERGIES:No Known Allergies    Lines/Tubes (day):   ETT 05/03/14   Aline 05/04/14   Central lines 05/04/14   Foley 05/03/14       CAM: Overall CAM-ICU : Delirium Present  RASS: [place] Agitation Sedation Scale: -2    DRIPS   Vasopressors:   Vasodilators:   Inotropes:    Other:  fentanyl    INs & OUTs       Intake/Output Summary (Last 24 hours) at 05/06/14 0759  Last data filed at 05/06/14 1610   Gross per 24 hour   Intake 2071.9 ml   Output   1475 ml   Net  596.9 ml       VENTILATION   Vent Mode:  [-] VC-AC/PRVC  FiO2:  [40 %-100 %] 40 %  S RR:  [16-22] 16  S VT:  [350 mL] 350 mL  PEEP/CPAP:  [5 cm H20] 5 cm H20      Lab Results   Component Value Date    PHART 7.42 05/06/2014    PCO2 44 05/06/2014    PO2ART 85 05/06/2014    HCO3ART 28* 05/06/2014    BEART 3.6* 05/06/2014    HBO2PER 90.9* 05/06/2014       VITALS   Temp (24hrs), Avg:99 ??F (37.2 ??C), Min:98.8 ??F (37.1 ??C), Max:99.5 ??F (37.5 ??C)    Patient Vitals for the past 4 hrs:   Temp Temp src Pulse Resp SpO2 Weight   05/06/14 0601 - - - - - 127 lb 6.8 oz (57.8 kg)   05/06/14 0600 - - 125 17 98 % -   05/06/14 0500 - - 112 21 97 % -   05/06/14 0426 - - 114 19 97 % -   05/06/14 0400 99 ??F (37.2 ??C) Swan 121 21 98 % -       PHYSICAL EXAM  General: sedated/intubated  Neurologic: difficult assessing 2/2 sedated/intubated. Would open eyes but not track  Pulmonary: bilateral coarse breath sounds  Cardiovascular: tachycardic. Systolic murmur. jvp+  Abdominal: soft, nt/nd, no masses  Extremities: trace pedal edema bilat      ANTIBIOTICS  Vanc/cefepime 05/04/14    NUTRITION  Enteral feeds    LABS   Lab Results   Component Value Date    GLUCOSE 92 05/06/2014    BUN 61* 05/06/2014    CREATININE 2.76* 05/06/2014    K 4.0 05/06/2014    NA 140 05/06/2014    CL 107 05/06/2014    CALCIUM 8.3* 05/06/2014      Lab Results   Component Value  Date    MG 2.6* 05/06/2014  Lab Results   Component Value Date    PHOS 3.5 05/06/2014     Lab Results   Component Value Date    ALKPHOS 73 05/03/2014    ALT 47 05/03/2014    AST 69* 05/03/2014    BILITOT 4.4* 05/03/2014    ALBUMIN 2.6* 05/06/2014    BILIDIRECT 3.10* 05/03/2014    PROT 7.1 05/03/2014     Lab Results   Component Value Date    WBC 6.3 05/06/2014    HGB 9.1* 05/06/2014    HCT 29.3* 05/06/2014    MCV 80.3 05/06/2014    PLT 160 05/06/2014     No results found for: ANTIXAF No results found for: PTT   Lab Results   Component Value Date    INR 1.4* 05/06/2014    INR 1.6* 05/05/2014    INR 1.8* 05/03/2014    PROTIME 16.4* 05/06/2014    PROTIME 18.6* 05/05/2014    PROTIME 20.1* 05/03/2014    No results found for: FIBRINOGEN No results found for: TEGANGLE, TEGKTIME, TEGLYSIS30, TEGMAXAMPL, TEGRTIME, CBMZ  FSBS Range: No results found for: GLUFAST    MICROBIOLOGY  05/04/14 bcx peripheral x2  05/04/14 bcx line  05/04/14 ucx    ===========================================================    Glenn Christo, MD  7:59 AM

## 2014-05-06 NOTE — Unmapped (Signed)
Renal Consult Follow Up Note      Chief complaint on admission : concern for hepatic failure/AKI    Reason for Renal Consult : AKI       HPI:  Gabrielle Rivera is a 64 y.o. 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 As transfer from [facility] for continued evaluation of AMS/renal failure/liver dysfunction?? for whom we are consulted for AKI.    Patient recently diagnosed with uterine fibroid and was taking Tylenol for pain although levels were not largely elevated on admission to [facility].    Patient presented 05/02/14 to [facility] with N/V/abd pain noted to have elevated INR 1.5 and elevated T bili to 1.10. BUN/Cr on admission was 59/2.78 with baseline ~2.0.?? U/S Abd showed diffuse gallbladder thickening which couple imply cholecystitis without stones found.?? 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 Patient was taking Lasix 40 mg BID / Lisinopril 2.5 mg which were held on admission due to AKI; light hydration NS @ 50 ml/hr per chart.    Patient became altered and started having hallucinations and was sedated/intubated for imaging of head. CT head without acute findings.    Contrast exposure:?? None  Nephrotoxic drug exposure: None  Hypotensive episodes: None    Past medical, family, and social histories were reviewed as previously documented. Updates were made as necessary.    Interval History:   Continues to have good UO without diuretics.         HISTORIES.    PFSH unchanged    ROS.  Cannot obtain from intubated pt.       PHYSICAL EXAM    Temp:  [98.8 ??F (37.1 ??C)-99.9 ??F (37.7 ??C)] 99.9 ??F (37.7 ??C)  Heart Rate:  [105-132] 125  Resp:  [11-24] 12  BP: (91)/(54) 91/54 mmHg  Arterial Line BP: (90-121)/(51-70) 114/64 mmHg  FiO2:  [40 %] 40 %    Wt Readings from Last 3 Encounters:   05/06/14 127 lb 6.8 oz (57.8 kg)         Intake/Output  Summary (Last 24 hours) at 05/06/14 1218  Last data filed at 05/06/14 0919   Gross per 24 hour   Intake 2086.5 ml   Output   1215 ml   Net  871.5 ml       Constitutional :Intuabated and sedated.   Skin :   No rash, lesions, warm to touch  HENT:  Normocephalic, external ears and nose normal  Eyes :   no injection, icterus, EOM grossly intact.  Neck :   supple, No tracheal shift, no Thy mass  Chest :  No respiratory distress, On ventilator.     clear to auscultation, BS vesicular +0  CVS :   HR regular, HS S1 S2 + 0    LE edema neg  ABD :   non-distended, soft, non-tender,   Psych :  mood and affect approtriate, normal interaction, eye contact.      No Known Allergies    MEDS     Scheduled Meds:  ??? aspirin  81 mg Oral Daily with breakfast   ??? atorvastatin  10 mg Oral Nightly (2100)   ??? ceFEPime (MAXIPIME) IV extended infusion  2 g Intravenous Q24H   ??? famotidine (PF)  20 mg Intravenous Daily 0900   ??? flu vaccine (seasonal < 65  yr)(PF) IM 0.5 mL  0.5 mL Intramuscular During Hospitalization   ??? heparin (porcine)  5,000 Units Subcutaneous 3 times per day   ??? lactulose  10 g Oral TID   ??? rifaximin  550 mg Oral BID   ??? senna-docusate  1 tablet Oral BID   ??? vancomycin (VANCOCIN) IVPB - CENTRAL LINE  1,000 mg Intravenous Once     Continuous Infusions:  ??? dexmedetomidine (PRECEDEX) 400 mcg in    100 mL 0.9% NaCl (4 mcg/mL) IV Infusion Stopped (05/06/14 1026)   ??? fentanyl (SUBLIMAZE) infusion standard concentration 50 mcg/hr (05/06/14 0919)   ??? ketamine (KETALAR) 500 mg infusion Stopped (05/06/14 0919)   ??? milrinone 0.25 mcg/kg/min (05/06/14 0142)   ??? norepinephrine infusion Stopped (05/06/14 0849)     PRN Meds:.ondansetron, vancomycin intermittent/pulse dosing      DATA :       Date 05/05/14 0700 - 05/06/14 0659 05/06/14 0700 - 05/07/14 0659   Shift 1610-9604 1500-2259 2300-0659 24 Hour Total 0700-1459 1500-2259 2300-0659 24 Hour Total   I  N  T  A  K  E   I.V.  (mL/kg)  947.8  (16.6) 685.1  (11.9) 1632.9  (28.3) 14.6  (0.3)    14.6  (0.3)      I.V.  524.1 386 910.1          Volume (mL) Dobutamine  54.2 2.5 56.7          Volume (mL) Propofol   43.1  43.1          Volume (mL) Fentanyl  223 183 406          Volume (mL) Milrinone    36.1 36.1          Volume (mL) Ketamine  22 55.1 77.1 12.4   12.4      Volume (mL) Norepinephrine  81.4 22.4 103.8 2.2   2.2    NG/GT 75 120 244 439 75   75      Residual  Returned (mL)  0  0 0   0      Flushes (mL) (NG/OG Tube Orogastric Left mouth) 25 80  105 25   25      Intake (mL) (NG/OG Tube Orogastric Left mouth) 50 40 244 334 50   50    Shift Total  (mL/kg) 75  (1.3) 1067.8  (18.7) 929.1  (16.1) 2071.9  (35.8) 89.6  (1.6)   89.6  (1.6)   O  U  T  P  U  T   Urine  (mL/kg/hr) 500  (1.1) 550  (1.2) 425  (0.9) 1475  (1.1) 130   130      Output (mL) (Urethral Catheter Latex;Double-lumen) 500 [phone] 130   130    Emesis/NG output  0 0 0 0   0      Residual (mL)  0 0 0 0   0    Shift Total  (mL/kg) 500  (8.8) 550  (9.6) 425  (7.4) 1475  (25.5) 130  (2.2)   130  (2.2)   Weight (kg) 57 57 57.8 57.8 57.8 57.8 57.8 57.8       Lab Results   Component Value Date    CREATININE 2.76* 05/06/2014    BUN 61* 05/06/2014    NA 140 05/06/2014    K 4.0 05/06/2014    CL 107 05/06/2014    CO2 27 05/06/2014     Lab Results   Component  Value Date    CALCIUM 8.3* 05/06/2014    PHOS 3.5 05/06/2014     No results found for: [facility]  Lab Results   Component Value Date    WBC 6.3 05/06/2014    HGB 9.1* 05/06/2014    HCT 29.3* 05/06/2014    MCV 80.3 05/06/2014    PLT 160 05/06/2014     Lab Results   Component Value Date    IRON 17* 05/04/2014    TIBC 377 05/04/2014    FERRITIN 42.4 05/04/2014     Lab Results   Component Value Date    KETONESU Negative 05/04/2014    BLOODU Moderate* 05/04/2014    BILIRUBINUR Negative 05/04/2014    UROBILINOGEN <2.0 05/04/2014    PROTEINUA Negative 05/04/2014    NITRITE Negative 05/04/2014    LEUKOCYTESUR Trace* 05/04/2014    PHUR 6.0 05/04/2014    LABSPEC 1.008 05/04/2014    CLARITYU Clear  05/04/2014    COLORU Yellow 05/04/2014           In addition to the above, I have reviewed an extensive amount of complex data.    Morbidity / complication risk is felt to be :  high.      Assessment/Plan:   Gabrielle Rivera is a 64 y.o. 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 As transfer from [facility] for continued evaluation of AMS/renal failure/liver dysfunction?? for whom we are consulted for AKI.    #.) Anuric AKI on CKD  - Baseline SCr 2.5 likely 2/2 poor perfusion CHF/HTN; R kidney ~ 9 cm at OSH suggesting chronic disease  - Unclear proteinuria prior to AKI; no records in system  - Suspect AKI etiology pre-renal to ATN on ACE vs. CRS vs. HRS  - UNa at OSH 28; suggesting pre-renal picture but unlikely HRS especially given liver not acutely decompensated  - Insults include: Lisinopril 2.5 mg at home stopped at OSH. Depressed cardiac function.   - Cr is improving, almost back to baseline. Patient is diuresing well without IV diuretics. Dobutamine has been weaned of, still on some levophed.     Concern for acute hepatic failure:  Evaluated by hepatology. Patient likely has chronic liver disease due to Hep C. Current liver dysfunction is likely due to hepatic congestion secondary to poor cardiac function.       Thank you for the consult.      [facility]  05/06/2014    Discussed with Consult staff Dr Olga Millers

## 2014-05-06 NOTE — Unmapped (Signed)
Extubated per MD. No complications, placed on 3 liters NC. Will continue to monitor.

## 2014-05-07 LAB — CBC
Hematocrit: 30.4 % (ref 35.0–45.0)
Hemoglobin: 9.4 g/dL (ref 11.7–15.5)
MCH: 24.9 pg (ref 27.0–33.0)
MCHC: 30.9 g/dL (ref 32.0–36.0)
MCV: 80.6 fL (ref 80.0–100.0)
MPV: 8.3 fL (ref 7.5–11.5)
Platelets: 161 10*3/uL (ref 140–400)
RBC: 3.77 10*6/uL (ref 3.80–5.10)
RDW: 18.8 % (ref 11.0–15.0)
WBC: 7.1 10*3/uL (ref 3.8–10.8)

## 2014-05-07 LAB — BASIC METABOLIC PANEL
Anion Gap: 7 mmol/L (ref 3–16)
Anion Gap: 17 mmol/L (ref 3–16)
BUN: 47 mg/dL (ref 7–25)
BUN: 58 mg/dL (ref 7–25)
CO2: 26 mmol/L (ref 21–33)
CO2: 21 mmol/L (ref 21–33)
Calcium: 9 mg/dL (ref 8.6–10.3)
Calcium: 9.3 mg/dL (ref 8.6–10.3)
Chloride: 112 mmol/L (ref 98–110)
Chloride: 110 mmol/L (ref 98–110)
Creatinine: 2.13 mg/dL (ref 0.60–1.30)
Creatinine: 2.89 mg/dL (ref 0.60–1.30)
GFR MDRD Af Amer: 29 See note.
GFR MDRD Af Amer: 20 See note.
GFR MDRD Non Af Amer: 24 See note.
GFR MDRD Non Af Amer: 17 See note.
Glucose: 104 mg/dL (ref 70–100)
Glucose: 143 mg/dL (ref 70–100)
Osmolality, Calculated: 313 mOsm/kg (ref 278–305)
Osmolality, Calculated: 325 mOsm/kg (ref 278–305)
Potassium: 4.2 mmol/L (ref 3.5–5.3)
Potassium: 4.3 mmol/L (ref 3.5–5.3)
Sodium: 145 mmol/L (ref 133–146)
Sodium: 148 mmol/L (ref 133–146)

## 2014-05-07 LAB — VENOUS BLOOD GAS, LINE/SYRINGE
%HBO2-Line Draw: 45.3 % (ref 40.0–70.0)
Base Excess-Line Draw: 0.8 mmol/L (ref <=3.0)
CO2 Content-Line Draw: 27 mmol/L (ref 25–29)
Carboxyhgb-Line Draw: 2.3 %
HCO3-Line Draw: 26 mmol/L (ref 24–28)
Methemoglobin-Line Draw: 1.6 % (ref 0.0–1.5)
PCO2-Line Draw: 41 mm Hg (ref 41–51)
PH-Line Draw: 7.4 (ref 7.32–7.42)
PO2-Line Draw: 29 mm Hg (ref 25–40)
Reduced Hemoglobin-Line Draw: 50.8 % (ref 0.0–5.0)

## 2014-05-07 LAB — POC GLU MONITORING DEVICE: POC Glucose Monitoring Device: 134 mg/dL (ref 70–100)

## 2014-05-07 LAB — T4, FREE: Free T4: 0.87 ng/dL (ref 0.61–1.76)

## 2014-05-07 LAB — MAGNESIUM
Magnesium: 2.5 mg/dL (ref 1.5–2.5)
Magnesium: 2.5 mg/dL (ref 1.5–2.5)

## 2014-05-07 LAB — THYROID FUNCTION CASCADE: TSH: 0.4 u[IU]/mL (ref 0.34–5.60)

## 2014-05-07 LAB — VANCOMYCIN, RANDOM: Vancomycin Random: 15.4 ug/mL

## 2014-05-07 LAB — BLOOD CULTURE-PERIPHERAL: Culture Result: NO GROWTH

## 2014-05-07 LAB — PHOSPHORUS
Phosphorus: 3.7 mg/dL (ref 2.1–4.7)
Phosphorus: 4 mg/dL (ref 2.1–4.7)

## 2014-05-07 MED ORDER — haloperidol lactate (HALDOL) injection 5 mg
5 | Freq: Once | INTRAMUSCULAR | Status: AC
Start: 2014-05-07 — End: 2014-05-07

## 2014-05-07 MED ORDER — famotidine (PEPCID) tablet 20 mg
20 | Freq: Every day | ORAL | Status: AC
Start: 2014-05-07 — End: 2014-05-11
  Administered 2014-05-08 – 2014-05-11 (×3): 20 mg via ORAL

## 2014-05-07 MED ORDER — dextrose 5% in water (D5W) infusion
INTRAVENOUS | Status: AC
Start: 2014-05-07 — End: 2014-05-07
  Administered 2014-05-07: 21:00:00 50 mL/h via INTRAVENOUS

## 2014-05-07 MED ORDER — famotidine (PEPCID) tablet 20 mg
20 | Freq: Every day | ORAL | Status: AC
Start: 2014-05-07 — End: 2014-05-07

## 2014-05-07 MED ORDER — famotidine (PEPCID) tablet 20 mg
20 | Freq: Two times a day (BID) | ORAL | Status: AC
Start: 2014-05-07 — End: 2014-05-07

## 2014-05-07 MED ORDER — haloperidol lactate (HALDOL) injection 5 mg
5 | Freq: Four times a day (QID) | INTRAMUSCULAR | Status: AC | PRN
Start: 2014-05-07 — End: 2014-05-08
  Administered 2014-05-07 – 2014-05-08 (×2): 5 mg via INTRAVENOUS

## 2014-05-07 MED ORDER — metoprolol tartrate (LOPRESSOR) partial tablet 12.5 mg
12.5 | Freq: Two times a day (BID) | ORAL | Status: AC
Start: 2014-05-07 — End: 2014-05-09
  Administered 2014-05-07 – 2014-05-08 (×3): 12.5 mg via ORAL

## 2014-05-07 MED ORDER — HYDROmorphone (DILAUDID) injection Syrg 0.5 mg
1 | Freq: Once | INTRAMUSCULAR | Status: AC
Start: 2014-05-07 — End: 2014-05-07
  Administered 2014-05-07: 06:00:00 0.5 mg via INTRAVENOUS

## 2014-05-07 MED ORDER — vancomycin 1000 mg/D5W 200 mL IV PGBK
1 | INTRAVENOUS | Status: AC
Start: 2014-05-07 — End: 2014-05-08
  Administered 2014-05-07 – 2014-05-08 (×2): 1000 mg via INTRAVENOUS

## 2014-05-07 MED ORDER — fentaNYL (SUBLIMAZE) injection 25 mcg
50 | Freq: Once | INTRAMUSCULAR | Status: AC
Start: 2014-05-07 — End: 2014-05-07

## 2014-05-07 MED ORDER — haloperidol lactate (HALDOL) 5 mg/mL injection
5 | INTRAMUSCULAR | Status: AC
Start: 2014-05-07 — End: 2014-05-07
  Administered 2014-05-07: 18:00:00 5 via INTRAVENOUS

## 2014-05-07 MED ORDER — fentaNYL (SUBLIMAZE) 50 mcg/mL injection
50 | INTRAMUSCULAR | Status: AC
Start: 2014-05-07 — End: 2014-05-07
  Administered 2014-05-07: 17:00:00 25 via INTRAVENOUS

## 2014-05-07 MED FILL — HALOPERIDOL LACTATE 5 MG/ML INJECTION SOLUTION: 5 5 mg/mL | INTRAMUSCULAR | Qty: 1

## 2014-05-07 MED FILL — FENTANYL (PF) 50 MCG/ML INJECTION SOLUTION: 50 50 mcg/mL | INTRAMUSCULAR | Qty: 5

## 2014-05-07 MED FILL — SENNA-S 8.6 MG-50 MG TABLET: 8.6-50 8.6-50 mg | ORAL | Qty: 1

## 2014-05-07 MED FILL — HEPARIN (PORCINE) 5,000 UNIT/ML INJECTION SOLUTION: 5000 5,000 unit/mL | INTRAMUSCULAR | Qty: 1

## 2014-05-07 MED FILL — CEFEPIME 2 GRAM/100 ML IN DEXTROSE (ISO-OSMOTIC) INTRAVENOUS PIGGYBACK: 2 2 gram/100 mL | INTRAVENOUS | Qty: 100

## 2014-05-07 MED FILL — ASPIRIN 81 MG CHEWABLE TABLET: 81 81 MG | ORAL | Qty: 1

## 2014-05-07 MED FILL — FUROSEMIDE 10 MG/ML INJECTION SOLUTION: 10 10 mg/mL | INTRAMUSCULAR | Qty: 4

## 2014-05-07 MED FILL — METOPROLOL TARTRATE 12.5 MG ORAL DOSE: 12.5 12.5 MG | ORAL | Qty: 1

## 2014-05-07 MED FILL — HYDROMORPHONE 1 MG/ML INJECTION SYRINGE: 1 1 mg/mL | INTRAMUSCULAR | Qty: 1

## 2014-05-07 MED FILL — FAMOTIDINE (PF) 20 MG/2 ML INTRAVENOUS SOLUTION: 20 20 mg/2 mL | INTRAVENOUS | Qty: 2

## 2014-05-07 MED FILL — ATORVASTATIN 10 MG TABLET: 10 10 MG | ORAL | Qty: 1

## 2014-05-07 MED FILL — VANCOMYCIN 1 GRAM/200 ML IN DEXTROSE 5 % INTRAVENOUS PIGGYBACK: 1 1 gram/200 mL | INTRAVENOUS | Qty: 200

## 2014-05-07 NOTE — Unmapped (Signed)
Speech Language/Pathology  Clinical Swallow Assessment    Name: Gabrielle Rivera  DOB: 12-Jul-1950  Attending Physician: Margaretha Sheffield, DO  Admission Diagnosis: HEPATIC FAILURE  fluid overload  Date: 05/07/2014  Reviewed Pertinent hospital course: Yes  Hospital Course SLP: Patient is a 64yo female who presents with history of HFrEF EF 15-20%, CAD s/p DES, CVA, HTN, HLD and stage III CKD transferred from [facility] with concerns for fulminant hepatic failure on 05/03/14. Intubated 2/2 - 2/5. CXR (05/05/14): left lower lobe collapse and airspace opacity at right base, atelectasis or infiltrate. SLP History-none.     Assessment: Patient presents with oropharyngeal dysphagia secondary to reduced oral-motor status and delayed swallow initiation, complicated by weak cough and encephalopathy with altered mental status resulting in signs and symptoms of penetration/aspiration on large and consecutive bolus of thins. Pt unable to independently take small bolus of thins, therefore, nectar thick liquid is recommended for safest diet at this time. Additionally, she was unable to masticate soft solid and the bolus was removed from her mouth. Patient is at an increased risk of aspiration and recommended for Dysphagia 1 (puree) diet with NECTAR-THICK liquids.    Plan:  1. SLP therapy 1-3x/week while inpatient   2. SLP at discharge is recommended   3. Diet Recommended: Dysphagia 1 (puree) diet with NECTAR-THICK liquids      Orientation:  Person: Yes  Place: No  Time: No  Situation: No    Pain:  Pain Score: 0-No pain    Aspiration Risk  Diet Solids Recommendation: Dysphagia 1  Diet Liquids Recommendations: Nectar thick only    Goals:   Patient will tolerate least restrictive diet with no signs or symptoms of aspiration at 100% accuracy: with min cues.   Patient will demonstrate use of swallowing exercises: for oral and pharyngeal strengthening at 100% accuracy with min cues.   Patient and/or caregiver will demonstrate ability to  prepare food and liquid consistencies at 100% accuracy: with min cues.   Patient and/or caregiver will demonstrate understanding of clinical signs and symptoms of aspiration at 100% accuracy: with min cues.   Patient and/or caregiver will demonstrate understanding of risks associated with aspiration at 100% accuracy: with min cues.   Time frame for goal to be met in: 2.19.16      Recommendation  Recommendations  Recommendation: Recommend SLP therapy at discharge    Baseline Assessment  History of Intubation: Yes  Length of Intubations (days): 3 days  Date extubated: 05/06/14  Behavior/Cognition: Confused;Impulsive  Vision: Functional for self-feeding  Volitional Cough: Strong  Volitional Swallow: Delayed    Respiratory Status  Respiratory Status: O2 via nasual cannula    Oral/Motor  Labial ROM: Within Functional Limits  Labial Symmetry: Within Functional Limits  Labial Strength: Within Functional Limits  Lingual Symmetry: Within Functional Limits  Lingual Sensation: Within Functional Limits  Facial Strength: Within Functional Limits  Facial Sensation: Within Functional Limits  Mandible: Within Functional Limits  Baseline Vocal Quality: Weak (mildly)  Intelligibility: Intelligible  Dysarthria : None    Consistencies Assessed  Thin;Nectar;Puree;Soft Solid    Thin 1-10  Presentation:  (occluded straw, cup, straw)  Oral: Prolonged Oral Phase  Pharyngeal: Delayed swallow (with large bolus, multiple swallows and throat clear x 2), fatigue    Nectar 11-16, 19-20, 26-30  Presentation: Cup;Straw  Oral: Prolonged Oral Phase  Pharyngeal: Delayed Swallow    Puree 17-18, 22-25  Presentation: Spoon  Oral: Prolonged Oral Phase  Pharyngeal: Delayed Swallow    Soft Solid  21  Oral: Impaired Mastication (unable to masticate, bolus removed from mouth by SLP)    Education:  Patient educated on role of SLP, current POC, and discharge recommendations for SLP therapy.   Patient educated on swallowing anatomy & physiology, dysphagia, risks  of aspiration, current diet recommendations, and safe swallowing behaviors.   Patient was unable to demonstrate understanding.  RN and MD verbalized understanding.       Alen Blew, M.A., CCC-SLP, BCS-S  Speech-Language Pathologist  Board Certified Specialist in Swallowing and Swallowing Disorders  MBSImP Certified Clinician  [phone] Pager #: [phone]  Weekend Pager #: [phone]  Office #: [phone]  Hours: T & R  0730-1800      Time  Start Time: 1050  Stop Time: 1111  Time Calculation (min): 21 min    Charges   $Clinical Swallow: 1 Procedure       Patient Class   Inpatient

## 2014-05-07 NOTE — Unmapped (Signed)
Renal Consult Follow Up Note      Chief complaint on admission : concern for hepatic failure/AKI    Reason for Renal Consult : AKI       HPI:  Gabrielle Rivera is a 64 y.o. 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 As transfer from [facility] for continued evaluation of AMS/renal failure/liver dysfunction?? for whom we are consulted for AKI.    Patient recently diagnosed with uterine fibroid and was taking Tylenol for pain although levels were not largely elevated on admission to [facility].    Patient presented 05/02/14 to [facility] with N/V/abd pain noted to have elevated INR 1.5 and elevated T bili to 1.10. BUN/Cr on admission was 59/2.78 with baseline ~2.0.?? U/S Abd showed diffuse gallbladder thickening which couple imply cholecystitis without stones found.?? 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 Patient was taking Lasix 40 mg BID / Lisinopril 2.5 mg which were held on admission due to AKI; light hydration NS @ 50 ml/hr per chart.    Patient became altered and started having hallucinations and was sedated/intubated for imaging of head. CT head without acute findings.    Contrast exposure:?? None  Nephrotoxic drug exposure: None  Hypotensive episodes: None    Past medical, family, and social histories were reviewed as previously documented. Updates were made as necessary.    Interval History:   Continues to have good UO without diuretics.         HISTORIES.    PFSH unchanged    ROS.  Confused cannot be obtained      PHYSICAL EXAM    Temp:  [97.6 ??F (36.4 ??C)-100.5 ??F (38.1 ??C)] 100.5 ??F (38.1 ??C)  Heart Rate:  [118-138] 131  Resp:  [17-41] 30  Arterial Line BP: (107-136)/(57-80) 136/72 mmHg    Wt Readings from Last 3 Encounters:   05/06/14 57.8 kg (127 lb 6.8 oz)         Intake/Output Summary (Last 24 hours) at 05/07/14 1215  Last data  filed at 05/07/14 0946   Gross per 24 hour   Intake  839.4 ml   Output   1665 ml   Net -825.6 ml       Constitutional :AMS  Skin :   No rash, lesions, warm to touch  HENT:  Normocephalic, external ears and nose normal  Eyes :   no injection, icterus, EOM grossly intact.  Neck :   supple, No tracheal shift, no Thy mass  Chest :  No respiratory distress, On ventilator.     clear to auscultation, BS vesicular +0  CVS :   HR regular, HS S1 S2 + 0    LE edema neg  ABD :   non-distended, soft, non-tender,   Psych :  AMS      No Known Allergies    MEDS     Scheduled Meds:  ??? aspirin  81 mg Oral Daily with breakfast   ??? atorvastatin  10 mg Oral Nightly (2100)   ??? ceFEPime (MAXIPIME) IV extended infusion  2 g Intravenous Q24H   ??? famotidine (PF)  20 mg Intravenous Daily 0900   ??? fentaNYL       ??? fentaNYL  25 mcg Intravenous Once   ??? flu vaccine (seasonal < 65 yr)(PF) IM 0.5 mL  0.5 mL  Intramuscular During Hospitalization   ??? furosemide (LASIX) injection  40 mg Intravenous Daily 0900   ??? heparin (porcine)  5,000 Units Subcutaneous 3 times per day   ??? metoprolol tartrate  12.5 mg Oral BID   ??? senna-docusate  1 tablet Oral BID   ??? vancomycin  1,000 mg Intravenous Q24H     Continuous Infusions:     PRN Meds:.ondansetron, vancomycin intermittent/pulse dosing      DATA :       Date 05/06/14 0700 - 05/07/14 0659 05/07/14 0700 - 05/08/14 0659   Shift 0700-1459 1500-2259 2300-0659 24 Hour Total 0700-1459 1500-2259 2300-0659 24 Hour Total   I  N  T  A  K  E   I.V.  (mL/kg) 14.6  (0.3) 507.4  (8.8) 332  (5.7) 854  (14.8)          I.V.  395 332 727          Volume (mL) Fentanyl  71.7  71.7          Volume (mL) Milrinone   40.7  40.7          Volume (mL) Dexmedetomidine 0   0          Volume (mL) Ketamine 12.4   12.4          Volume (mL) Norepinephrine 2.2   2.2        NG/GT 75   75          Residual  Returned (mL) 0   0          Flushes (mL) ([REMOVED] NG/OG Tube Orogastric Left mouth) 25   25          Intake (mL) ([REMOVED] NG/OG Tube  Orogastric Left mouth) 50   50        Shift Total  (mL/kg) 89.6  (1.6) 507.4  (8.8) 332  (5.7) 929  (16.1)       O  U  T  P  U  T   Urine  (mL/kg/hr) 290  (0.6) 370  (0.8) 1180  (2.6) 1840  (1.3) 70   70      Output (mL) (Urethral Catheter Latex;Double-lumen) [id number] 1840 70   70    Emesis/NG output 0   0          Residual (mL) 0   0        Stool              Stool Occurrence   2 x 2 x 1 x   1 x    Shift Total  (mL/kg) 290  (5) 370  (6.4) 1180  (20.4) 1840  (31.8) 70  (1.2)   70  (1.2)   Weight (kg) 57.8 57.8 57.8 57.8 57.8 57.8 57.8 57.8       Lab Results   Component Value Date    CREATININE 2.13* 05/07/2014    BUN 47* 05/07/2014    NA 145 05/07/2014    K 4.2 05/07/2014    CL 112* 05/07/2014    CO2 26 05/07/2014     Lab Results   Component Value Date    CALCIUM 9.0 05/07/2014    PHOS 3.7 05/07/2014     No results found for: VITD25H  Lab Results   Component Value Date    WBC 7.1 05/07/2014    HGB 9.4* 05/07/2014    HCT 30.4* 05/07/2014    MCV 80.6 05/07/2014  PLT 161 05/07/2014     Lab Results   Component Value Date    IRON 17* 05/04/2014    TIBC 377 05/04/2014    FERRITIN 42.4 05/04/2014     Lab Results   Component Value Date    KETONESU Negative 05/04/2014    BLOODU Moderate* 05/04/2014    BILIRUBINUR Negative 05/04/2014    UROBILINOGEN <2.0 05/04/2014    PROTEINUA Negative 05/04/2014    NITRITE Negative 05/04/2014    LEUKOCYTESUR Trace* 05/04/2014    PHUR 6.0 05/04/2014    LABSPEC 1.008 05/04/2014    CLARITYU Clear 05/04/2014    COLORU Yellow 05/04/2014           In addition to the above, I have reviewed an extensive amount of complex data.    Morbidity / complication risk is felt to be :  high.      Assessment/Plan:   Gabrielle Rivera is a 64 y.o. 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 As transfer from [facility] for continued evaluation of AMS/renal failure/liver dysfunction?? for whom we are consulted for AKI.    #.)  Anuric AKI on CKD  - Baseline SCr 2.5 likely 2/2 poor perfusion CHF/HTN; R kidney ~ 9 cm at OSH suggesting chronic disease  - Unclear proteinuria prior to AKI; no records in system  - Suspect AKI etiology pre-renal to ATN on ACE vs. CRS vs. HRS  - UNa at OSH 28; suggesting pre-renal picture but unlikely HRS especially given liver not acutely decompensated  - Insults include: Lisinopril 2.5 mg at home stopped at OSH. Depressed cardiac function.   - Cr is improving, almost back to baseline. Patient is diuresing well without IV diuretics. Dobutamine has been weaned of, still on some levophed.     Concern for acute hepatic failure:  Evaluated by hepatology. Patient likely has chronic liver disease due to Hep C. Current liver dysfunction is likely due to hepatic congestion secondary to poor cardiac function.     Plan:   Renal function impriving. Continue diuresis. May benefit from afterload reduction medications    Thank you for the consult.      [facility]  Demarea Lorey  05/07/2014    Discussed with Consult staff Dr Olga Millers

## 2014-05-07 NOTE — Unmapped (Signed)
Clinical Pharmacy Service: Vancomycin Consult Progress Note    Gabrielle Rivera is a 64 y.o. female treated with vancomycin for sepsis.      Pharmacy consulted for vancomycin management by Dr. Avon Gully.    Current regimen is:  Pulse dosing    Serum vancomycin concentration is 15.4 mg/L which was drawn ~13.5 hours after the previous dose.     Recommendations:  ?? Will cautiously schedule 1,ooo mg every 24 hours. Daily random serum levels have been within goal range.  ?? A trough will be checked before tomorrow's dose to ensure there is no additional accumulation. Goal trough is 15-20 mg/L.  ?? Pharmacy will continue to follow for the monitoring of therapy efficacy and toxicity. Please call if you have any questions.  Thank you for the consult.      Laurita Quint, PharmD  PGY2 Critical Care Pharmacy Resident  Pager: [phone]   On-call pager: [phone]      -------------------------------------------------------------------------------------------------------------------      Vitals     No Known Allergies  Wt Readings from Last 3 Encounters:   05/06/14 127 lb 6.8 oz (57.8 kg)     Ht Readings from Last 3 Encounters:   05/03/14 5' 4 (1.626 m)       Temp:  [97.6 ??F (36.4 ??C)-100.5 ??F (38.1 ??C)] 100.5 ??F (38.1 ??C)  Heart Rate:  [118-138] 131  Resp:  [17-41] 30  Arterial Line BP: (107-136)/(57-80) 136/72 mmHg    Intake/Output Summary (Last 24 hours) at 05/07/14 1205  Last data filed at 05/07/14 0946   Gross per 24 hour   Intake  839.4 ml   Output   1665 ml   Net -825.6 ml         Laboratory Data and Imagining         Lab 05/07/14  0323 05/06/14  0204 05/05/14  0441 05/04/14  0504   WBC 7.1 6.3 7.2 9.2   HEMOGLOBIN, POC 9.4* 9.1* 9.5* 9.0*   HEMATOCRIT BLOOD GAS 30.4* 29.3* 29.5* 29.0*   MCV, POC 80.6 80.3 78.9* 79.9*   PLATELETS, POC 161 160 201 217         Lab 05/07/14  0323 05/06/14  1426 05/06/14  0204 05/05/14  1139   SODIUM 145 142 140 138   POTASSIUM 4.2 4.0 4.0 3.7   CHLORIDE 112* 109 107 104   CARBON DIOXIDE (CO2) 26  28 27 27    BUN 47* 50* 61* 76*   CREATININE 2.13* 2.41* 2.76* 3.62*   GLUCOSE 104* 109* 92 107*   CALCIUM 9.0 8.6 8.3* 8.5*   MAGNESIUM 2.5 2.7* 2.6* 2.8*   PHOSPHORUS 3.7 3.0 3.5 3.5       Cultures     No results found for: AEROBOT, ANABOT, LABGRAM, ISO2, ISO3, ISO4, ISO5, ISO6  No results found for: [facility]  Lab Results   Component Value Date    ISO1 No Growth After 48 Hours 05/04/2014         Pharmacokinetics     Recent Labs      05/05/14   0441  05/06/14   0526  05/07/14   0323   VANCORANDOM  15.5  17.2  15.4   ]

## 2014-05-07 NOTE — Unmapped (Signed)
Referral from previous shift Chaplain, patient requested a bible.  Introduction made to patient's son Barbara Cower at bedside. Provided pastoral support and prayer.  No other needs at this time.  Rev Lydia Guiles, Chaplain

## 2014-05-07 NOTE — Unmapped (Signed)
Pt anxious, agitated, and unable to follow commands. Pt is high fall risk d/t pt laying head on side rails and thrashing feet sideways out of bed. Pt unable to be redirected. Pt has L IJ Cortis and L Radial Aline. Pt has history of pulling out TLC. Pt room door open. Pt kept in bsw restraints for pt safety, for protection of medical devices, and to promote healing. RN will continue to monitor pt for need of restraints. Nasser Ku A Meredith Kilbride

## 2014-05-07 NOTE — Unmapped (Signed)
Pt admitted to unit from MICU, handoff received from [place], [place].  Pt x1 oriented to self and unable to verbalize understanding.  RN attempted to give pt education on plan of care and unit policies including fall precautions and safety measures.  Pt encouraged to use call light as needed.    Head to toe skin assessment performed with Darl Pikes, RN.  Findings noted on Doc Flowsheets.      Will continue to monitor and will offer nursing interventions as necessary.    Theador Hawthorne, RN

## 2014-05-07 NOTE — Unmapped (Signed)
Problem: Non-violent, non-self-destructive restraints  Less restrictive alternative interventions will be considered prior to application of restraint devices.   Goal: Patient will be restrained only to maintain safety  Alternatives to restraints can include; moving the patient closer to the nurses station, video monitoring where available, medicating for pain, agitation or psychosis, psychosocial interventions, manipulation of environment, frequent toileting, diversional activities, bed alarms, having family members sit with patient, frequent reorientation, or repositioning.   Outcome: Progressing  Patient remains anxious and confused despite multiple attempts to reorient. Unable to follow direction. Attempting to pull out medical equipment and lines. Restraints continued per order. Will continue to monitor.

## 2014-05-07 NOTE — Unmapped (Signed)
MICU PROGRESS NOTE    05/07/2014 10:12 AM Patient: Quentina Fronek LOS: 4 days     Significant events over the past 24 hours include:  -extubated yesterday, now on room air satting>95.   -evaluated by SLP rec dysphagia diet  -IV lasix 40mg  given last night, redosed this morning      DAILY PLAN  -transfer to floor for continued workup of metabolic encephalopathy  -resume metoprolol. Hold off on home lisinopril 2.5mg  qday and lasix 40 mg bid for now given aki and currently euvolemia, defer to floor team for addition  -PT/OT  -continue abx vanc/cefepime empirically total 7 day course    ===========================================================  ASSESSMENT    63yo F HFrEF EF 15-20% s/p ICD, CAD s/p DES, CVA, HTN, HLD, CKD stage 3 transferred from [facility] 05/03/14 w/ concerns for fulminant hepatic failure, found to actually be in cardiogenic shock with Biventricular heart failure and possibly septic shock    Neurologic  Metabolic encephalopathy - S/p Intubation and sedation w/ ketamine and fentanyl/propofol gtt. Extubated last night. This morning regards however keeps mumbling F** you repeatedly however did say her last name when asked. Potential causes - ICU delirium, infection, medications, or underlying psych issue  -Thyroid studies unremarkable  -treating empirically for infx 7 days of vanc/cefepime however no clear source at this time  -CT head osh neg  -OSH drug tox urine negative  -qtc rechecked today 453. Can use small dose haldol if agitated. Likely will need sitter on floor.     Hx CVA  - asa and statin    Pulmonary  S/p intubation for airway protection at OSH prior to transfer for encephalopathy. No satting well on RA    Cardiovascular   Shock: Cardiogenic +/- septic shock in setting of HFrEF - EF 15-20%. Initial swan reports concerning for both cardiogenic and septic shock however evaluation of later swan numbers suggested heart was compensated per cardiology, however at the same time the Ernestine Conrad was  giving contradictory numbers and malpositioned. Complicated by congestive hepatopathy and AKI.   -s/p levophed and dobutamine.   -daily weights, strict I/O. Foley in place  -Vancomycin/cefepime 7 day therapy empirically. pancultured neg  -hold off on fluids given cardiac issue. Pt diuresing on own well likely as a result of ATN  -RHC 05/04/14  RA pressure?? 9  RV pressure?? 52/9  PA pressure 55/28?? , PA mean 38  PCWP?? 25  RA sat?? 77%  RA Hgb?? 9.5  PA sat?? 77%  PA Hgb?? 8.9    ????????????????????????Fick  CO???????????? 7.1  CI???????????????? 4.3????????????  SVR????????831  PVR????????146    HFrEF - now appears euvolemic. S/p IV lasix 40mg  last night and this morning. Add back metoprolol. Lisinopril and lasix po per primary team    CAD - asa, statin; add back beta blocker    Gastrointestinal  Congestive hepatopathy  -likely a result of heart failure and sepsis  -U/S abdomen ordered  -Liver agrees likely 2/2 heart failure plus sepsis. Rec checking HCV RNA  -3day vit k challenge w/ improvement of INR however also in setting of improved clinical status in general    HCV Ab + : check HCV RNA quant    Chronic acalculous cholecystitis - HIDA scan w/ gallbladder wall thickening. No pericholecystic fluid collections    GI ppx - pt will be intubated >2 days. pepcid bid IV    Renal  AKI - likely related to cardiac dysfunction and sepsis. Cardiology rec q8h BMP. Renal following pt. Pt has  been consented for vas cath if needed by son, however due to improvement will not be needed.   -monitor uremia  -pt likely autodiuresing now from ATN. We have given IV lasix 40mg  x2 with continued improvement in volume status and creatine, appears euvolemic at this time    Acid/Base   No acute issues    Genito-Urinary  Foley in place. Autodiuresing, likely component of ATN. We have given IV lasix 40mg  x2 with continued improvement in volume status and creatine, appears euvolemic at this time    Infectious Disease  Septic shock   -levophed. Vancomycin/cefepime 7 day therapy  empirically. pancultured neg  -hold off on fluids given cardiogenic shock    Hep C ab +: check HCV RNA    Endocrine  Hyperglycemia - keep glucose ~140-180    Hematology/Oncology  Coagulopathy - INR on arrival elevated - likely 2/2 congestive hepatopathy plus likely malnutrition. plt dysfunction from uremia  -Vit K challenge x3days improved INR    Psychiatry  See neuro    Fluid/Electrolytes/Nutrition   Intravascular volume assessment: euvolemic now  IVF: avoid for now given low EF and euvolemic status  Electrolyte Abnormalities: replete. Keep watch on Mg ~2 and K ~4  Nutrition: dysphagia     Prophylaxis  DVT Prophylaxis: sqh  GI Prophylaxis: pepcid bid iv    Code Status Full Code    PT/OT/Reanimation    Family Communication  D/w son, states pt would want aggressive measures    Social Work    Disposition  MICU    ===========================================================  CODE STATUS: Full Code     ALLERGIES:No Known Allergies    Lines/Tubes (day):      Aline 05/04/14   Central lines 05/04/14   Foley 05/03/14       CAM: Overall CAM-ICU : Delirium Present  RASS: [place] Agitation Sedation Scale: +1    DRIPS   Vasopressors:   Vasodilators:   Inotropes:    Other:      INs & OUTs       Intake/Output Summary (Last 24 hours) at 05/07/14 1012  Last data filed at 05/07/14 0946   Gross per 24 hour   Intake  839.4 ml   Output   1780 ml   Net -940.6 ml       VENTILATION   Vent Mode:  [-] Spont PS/PEEP  PEEP/CPAP:  [5 cm H20] 5 cm H20      Lab Results   Component Value Date    PHART 7.39 05/06/2014    PCO2 46* 05/06/2014    PO2ART 101* 05/06/2014    HCO3ART 28* 05/06/2014    BEART 2.6 05/06/2014    HBO2PER 91.9* 05/06/2014       VITALS   Temp (24hrs), Avg:99.1 ??F (37.3 ??C), Min:97.6 ??F (36.4 ??C), Max:100.5 ??F (38.1 ??C)    Patient Vitals for the past 4 hrs:   Temp Temp src Pulse Resp SpO2   05/07/14 0900 - - 130 (!) 41 94 %   05/07/14 0800 - - 131 (!) 31 96 %   05/07/14 0756 100.5 ??F (38.1 ??C) Axillary - - -   05/07/14 0753 - - 135 30  96 %       PHYSICAL EXAM  General: extubated. Regards. Keeps repeating F** you. Doesn't follow commands. Oriented to person  Neurologic: extubated. Regards. Keeps repeating F** you. Doesn't follow commands. Oriented to person  Pulmonary: clear bilaterally  Cardiovascular: tachycardic. Systolic murmur. jvp+  Abdominal: soft, nt/nd, no masses  Extremities:  no pedal edema bilat      ANTIBIOTICS  Vanc/cefepime 05/04/14    NUTRITION  Enteral feeds    LABS   Lab Results   Component Value Date    GLUCOSE 104* 05/07/2014    BUN 47* 05/07/2014    CREATININE 2.13* 05/07/2014    K 4.2 05/07/2014    NA 145 05/07/2014    CL 112* 05/07/2014    CALCIUM 9.0 05/07/2014      Lab Results   Component Value Date    MG 2.5 05/07/2014      Lab Results   Component Value Date    PHOS 3.7 05/07/2014     Lab Results   Component Value Date    ALKPHOS 73 05/03/2014    ALT 47 05/03/2014    AST 69* 05/03/2014    BILITOT 4.4* 05/03/2014    ALBUMIN 2.6* 05/06/2014    BILIDIRECT 3.10* 05/03/2014    PROT 7.1 05/03/2014     Lab Results   Component Value Date    WBC 7.1 05/07/2014    HGB 9.4* 05/07/2014    HCT 30.4* 05/07/2014    MCV 80.6 05/07/2014    PLT 161 05/07/2014     No results found for: ANTIXAF No results found for: PTT   Lab Results   Component Value Date    INR 1.4* 05/06/2014    INR 1.6* 05/05/2014    INR 1.8* 05/03/2014    PROTIME 16.4* 05/06/2014    PROTIME 18.6* 05/05/2014    PROTIME 20.1* 05/03/2014    No results found for: FIBRINOGEN No results found for: TEGANGLE, TEGKTIME, TEGLYSIS30, TEGMAXAMPL, TEGRTIME, CBMZ  FSBS Range: No results found for: GLUFAST    MICROBIOLOGY  05/04/14 bcx peripheral x2  05/04/14 bcx line  05/04/14 ucx    ===========================================================    Lashaunda Schild, MD  10:12 AM

## 2014-05-07 NOTE — Unmapped (Signed)
Report called to Pria, RN on 8E. Pt to be transported on telemetry to [id number]. Son Barbara Cower notified of pt's transfer out of MICU and given new room assignment. Pt being transferred in restraints, order is active, see EPIC. Pt also being transferred on 2L n/c. Casmir Auguste A Adriyana Greenbaum

## 2014-05-07 NOTE — Unmapped (Signed)
Problem: Fall Prevention  Goal: Patient will remain free of falls  Assess and monitor vitals signs, neurological status including level of consciousness and orientation. Reassess fall risk per hospital policy.    Ensure arm band on, uncluttered walking paths in room, adequate room lighting, call light and overbed table within reach, bed in low position, wheels locked, side rails up per policy, and non-skid footwear provided.   Outcome: Progressing  The patient has a High (45-125) Morse Fall Score. Nursing interventions include:     Orient patient to call light and room environment. Keep call light within patient's reach. Eliminate obstacles and clutter. Hourly rounding. Answer call lights promptly. Bedside table and belongings within reach. Door open, room well lit. Non-skin footwear in place. Bed locked and in lowest position. Evaluate response to new medications. Safe handling of patient during transfers. Place yellow wristband on patient. Educate family on tips to prevent falls. Encourage family members to stay with patient. Remain with patient when he/she is in the bathroom or using the bedside commode. Activate bed alarm. Red fall risk sign placed outside patient room. Place patient in room as close to nursing station as possible. Evaluate need for sitter. Family and patient education on falls prevention provided and taught.

## 2014-05-07 NOTE — Unmapped (Signed)
Department of Internal Medicine  Daily Progress Note      Chief Complaint / Reason for Follow-Up     Gabrielle Rivera is a 64 y.o. female on hospital day 4.  The principal reason for today's follow up visit is Acute respiratory failure with hypoxia and hypercarbia.       Interval History / Subjective     Patient transferred from OSH on 05/03/14 for evaluation for liver transplant given ALF and encephalopathy thought to be 2/2 ALF. Patient came intubated but was extubated today without any O2 requirement. Found to be septic and in decompensated HF. Now that patient is more stable from her medical issues she's being transferred to the floor for further work up of her encephalopathy for which there is no clear etiology at this moment. Sepsis appears to be 2/2 HCAP for which patient was placed on Vanc/Cefepime for a 7 day course. HF also appears to be improved with auto diuresis from post ATN diuresis and IV lasix.      Review of Systems (Focused)     As above      Medications     Scheduled Meds:  ??? aspirin  81 mg Oral Daily with breakfast   ??? atorvastatin  10 mg Oral Nightly (2100)   ??? ceFEPime (MAXIPIME) IV extended infusion  2 g Intravenous Q24H   ??? [START ON 05/08/2014] famotidine  20 mg Oral BID   ??? flu vaccine (seasonal < 65 yr)(PF) IM 0.5 mL  0.5 mL Intramuscular During Hospitalization   ??? furosemide (LASIX) injection  40 mg Intravenous Daily 0900   ??? heparin (porcine)  5,000 Units Subcutaneous 3 times per day   ??? metoprolol tartrate  12.5 mg Oral BID   ??? senna-docusate  1 tablet Oral BID   ??? vancomycin  1,000 mg Intravenous Q24H     Continuous Infusions:   PRN Meds:  ondansetron, vancomycin intermittent/pulse dosing       Vital Signs     Temp:  [97.6 ??F (36.4 ??C)-101.7 ??F (38.7 ??C)] 101.7 ??F (38.7 ??C)  Heart Rate:  [118-138] 128  Resp:  [17-41] 30  BP: (132)/(70) 132/70 mmHg  Arterial Line BP: (107-136)/(57-80) 136/72 mmHg    Intake/Output Summary (Last 24 hours) at 05/07/14 1400  Last data filed at 05/07/14 1305    Gross per 24 hour   Intake  839.4 ml   Output   1695 ml   Net -855.6 ml         Physical Exam     General appearance: combative, delirious and malnourished  Lungs: clear to auscultation bilaterally and no respiratory distress  Heart: tachycardic, no murmurs  Abdomen: soft, non-tender; bowel sounds normal; no masses,  no organomegaly  Extremities: extremities normal, atraumatic, no cyanosis or edema  Pulses: 2+ and symmetric  Neurologic: Patient is alert, only oriented to self, not responding to simple questions, strength appears to be grossly preserved    Laboratory Data         Lab 05/07/14  0323 05/06/14  0204 05/05/14  0441 05/04/14  0504   WBC 7.1 6.3 7.2 9.2   HEMOGLOBIN, POC 9.4* 9.1* 9.5* 9.0*   HEMATOCRIT BLOOD GAS 30.4* 29.3* 29.5* 29.0*   MCV, POC 80.6 80.3 78.9* 79.9*   PLATELETS, POC 161 160 201 217           Lab 05/07/14  1245 05/07/14  0323 05/06/14  1426 05/06/14  0204   SODIUM 148* 145 142 140   POTASSIUM  4.3 4.2 4.0 4.0   CHLORIDE 110 112* 109 107   CARBON DIOXIDE (CO2) 21 26 28 27    BUN 58* 47* 50* 61*   CREATININE 2.89* 2.13* 2.41* 2.76*   GLUCOSE 143* 104* 109* 92           Lab 05/07/14  1245 05/07/14  0323 05/06/14  1426 05/06/14  0204   CALCIUM 9.3 9.0 8.6 8.3*   MAGNESIUM 2.5 2.5 2.7* 2.6*   PHOSPHORUS 4.0 3.7 3.0 3.5           Lab 05/06/14  0526 05/05/14  0441 05/03/14  2359   INR POC 1.4* 1.6* 1.8*   PROTHROMBIN TIME 16.4* 18.6* 20.1*           Lab 05/06/14  0204 05/04/14  1240 05/04/14  0504 05/04/14  0245 05/03/14  2359   ALT  --   --   --   --  47   AST  --   --   --   --  69*   ALK PHOS  --   --   --   --  73   BILIRUBIN, TOTAL  --   --   --   --  4.4*   BILIRUBIN, DIRECT  --   --   --   --  3.10*   ALBUMIN 2.6* 3.1* 3.1* 3.0* 3.1*   3.1*           Lab 05/04/14  1729 05/04/14  0327   COLOR, URINE, POC Yellow Yellow   CLARITY, URINE, POC Clear Slightly-Cloudy*   SPECIFIC GRAVITY, URINE 1.008 1.009   PH UA 6.0 6.0   PROTEIN UA, POC Negative Negative   GLUCOSE UA, POC Negative Negative    KETONES UA, POC Negative Negative   BILIRUBIN UA, POC Negative Negative   BLOOD UA, POC Moderate* Large*   NITRITE UA Negative Negative   UROBILINOGEN UA, POC <2.0 <2.0   LEUKOCYTES UA, POC Trace* Negative   RBC UA 6* 7*   WBC UA 4 4   BACTERIA Rare* Rare*           Lab 05/05/14  1139 05/04/14  2336 05/03/14  2359   TROPONIN I 1.12* 1.04* 0.74*       Lab Results   Component Value Date    NTPROBNP 29541* 05/03/2014       Lab Results   Component Value Date    TSH 0.40 05/07/2014    FREET4 0.87 05/07/2014           Lab 05/07/14  1238 05/05/14  0958 05/04/14  2036 05/04/14  1553   GLU MONITORING DEVICE, POC 134* 106* 193* 199*           Diagnostic Studies     None      Assessment & Plan     Gabrielle Rivera is a 64 y.o. female on HD# 4 with Acute respiratory failure with hypoxia and hypercarbia.  The medical issues being addressed in today's encounter are as follows:    Principal Problem:    Acute respiratory failure with hypoxia and hypercarbia  Active Problems:    Hepatitis C    AKI (acute kidney injury)    #) Encephalopathy  - Most likely multifactorial (acute liver failure, HCAP, ICU delirium)  - CT Head at OSH was negative for any bleeding or ischemia  - UDS at OSH was also unremarkable  - Will monitor at this moment as patient's other acute issues are improving    #)  HFrEF (EF 15-20%)  - Patient initially auto diuresed due to post ATN  - Received a IV lasix yesterday night and this AM, SCr initially came down to 2.13 but now up to 2.89  - Metoprolol tartrate started today, 12.5mg  BID  - Given rise in SCr and that patient appears to be euvolemic at this moment we will hold on any further diuresis  - Will hold ACE for now    #) Sepsis 2/2 HCAP  - CXR on admission showing right basilar atelectasis and/or pneumonia.  - Blood cx have shown NGTD  - Patient currently on Vanc/Cefepime, will complete 7 days total (4/7)  - Patient was intubated until 02/06, currently on RA  - VBG today was normal (7.40/41/29)    #) AKI on  CKD (baseline 2.5)  - SCr up to 4.6  - Currently at 2.89, most likely 2/2 IV lasix this AM  - Renal following, thought to be 2/2 hypoperfusion in setting of decompensated HF    #) Hypernatremia  - Serum Na today at 148  - Probably 2/2 post ATN diuresis and diuretics  - Will give D5W for now to replace free water and check renal panel at 7pm, if normal will most likely d/c as patient has a severely reduced EF and will easily decompensate if volume overloaded.    #) Acute liver failure  - Resolving  - INR up to 1.8 on day of transfer, down to 1.4 on most recent INR  - Seen by hepatology and thought to be most likely 2/2 congestive heart failure given improvement with now improving volume status  - Receiving 3 day of Vit. K    #) H/o CVA  - On 2ry prevention, ASA and statin      Nutrition:  Diet Orders          Diet dysphagia 1 Nectar Thick starting at 02/06 1110    Continuous tube feedings starting at 02/04 1156          Code Status: Full Code    Signed:  Doree Kuehne  Rodman Pickle, MD  05/07/2014, 2:00 PM

## 2014-05-08 ENCOUNTER — Inpatient Hospital Stay: Admit: 2014-05-08 | Payer: PRIVATE HEALTH INSURANCE

## 2014-05-08 ENCOUNTER — Inpatient Hospital Stay: Admit: 2014-05-09 | Payer: PRIVATE HEALTH INSURANCE

## 2014-05-08 LAB — RENAL FUNCTION PANEL W/EGFR
Albumin: 3 g/dL (ref 3.5–5.7)
Albumin: 3.5 g/dL (ref 3.5–5.7)
Anion Gap: 9 mmol/L (ref 3–16)
Anion Gap: 16 mmol/L (ref 3–16)
BUN: 62 mg/dL (ref 7–25)
BUN: 71 mg/dL (ref 7–25)
CO2: 23 mmol/L (ref 21–33)
CO2: 19 mmol/L (ref 21–33)
Calcium: 8.8 mg/dL (ref 8.6–10.3)
Calcium: 9.6 mg/dL (ref 8.6–10.3)
Chloride: 112 mmol/L (ref 98–110)
Chloride: 111 mmol/L (ref 98–110)
Creatinine: 3.1 mg/dL (ref 0.60–1.30)
Creatinine: 3.96 mg/dL (ref 0.60–1.30)
GFR MDRD Af Amer: 18 See note.
GFR MDRD Af Amer: 14 See note.
GFR MDRD Non Af Amer: 15 See note.
GFR MDRD Non Af Amer: 12 See note.
Glucose: 186 mg/dL (ref 70–100)
Glucose: 146 mg/dL (ref 70–100)
Osmolality, Calculated: 320 mOsm/kg (ref 278–305)
Osmolality, Calculated: 325 mOsm/kg (ref 278–305)
Phosphorus: 3.6 mg/dL (ref 2.1–4.7)
Phosphorus: 3.8 mg/dL (ref 2.1–4.7)
Potassium: 4.3 mmol/L (ref 3.5–5.3)
Potassium: 4.7 mmol/L (ref 3.5–5.3)
Sodium: 144 mmol/L (ref 133–146)
Sodium: 146 mmol/L (ref 133–146)

## 2014-05-08 LAB — URINALYSIS W/RFL TO MICROSCOPIC
Bilirubin, UA: NEGATIVE
Glucose, UA: NEGATIVE mg/dL
Hyaline Casts, UA: 4 /LPF (ref 0–2)
Ketones, UA: NEGATIVE mg/dL
Nitrite, UA: NEGATIVE
Protein, UA: 100 mg/dL
RBC, UA: 42 /HPF (ref 0–3)
Specific Gravity, UA: 1.017 (ref 1.005–1.035)
Squam Epithel, UA: 4 /HPF (ref 0–5)
Trans Epithel, UA: 2 /HPF (ref 0–5)
Urobilinogen, UA: <2 mg/dL (ref 0.2–1.9)
WBC, UA: 39 /HPF (ref 0–5)
pH, UA: 6 (ref 5.0–8.0)

## 2014-05-08 LAB — URINE CULTURE

## 2014-05-08 LAB — CBC
Hematocrit: 27.7 % (ref 35.0–45.0)
Hematocrit: 33.2 % (ref 35.0–45.0)
Hemoglobin: 8.5 g/dL (ref 11.7–15.5)
Hemoglobin: 10.2 g/dL (ref 11.7–15.5)
MCH: 24.9 pg (ref 27.0–33.0)
MCH: 24.9 pg (ref 27.0–33.0)
MCHC: 30.8 g/dL (ref 32.0–36.0)
MCHC: 30.7 g/dL (ref 32.0–36.0)
MCV: 80.7 fL (ref 80.0–100.0)
MCV: 81.1 fL (ref 80.0–100.0)
MPV: 8.4 fL (ref 7.5–11.5)
MPV: 9.1 fL (ref 7.5–11.5)
Platelet Estimate: ADEQUATE
Platelets: 159 10*3/uL (ref 140–400)
Platelets: 176 10*3/uL (ref 140–400)
RBC: 3.44 10*6/uL (ref 3.80–5.10)
RBC: 4.09 10*6/uL (ref 3.80–5.10)
RDW: 18.8 % (ref 11.0–15.0)
RDW: 19.5 % (ref 11.0–15.0)
WBC: 8.1 10*3/uL (ref 3.8–10.8)
WBC: 10.3 10*3/uL (ref 3.8–10.8)

## 2014-05-08 LAB — BASIC METABOLIC PANEL
Anion Gap: 15 mmol/L (ref 3–16)
BUN: 71 mg/dL (ref 7–25)
CO2: 20 mmol/L (ref 21–33)
Calcium: 8.7 mg/dL (ref 8.6–10.3)
Chloride: 113 mmol/L (ref 98–110)
Creatinine: 4.04 mg/dL (ref 0.60–1.30)
GFR MDRD Af Amer: 14 See note.
GFR MDRD Non Af Amer: 11 See note.
Glucose: 175 mg/dL (ref 70–100)
Osmolality, Calculated: 331 mOsm/kg (ref 278–305)
Potassium: 4 mmol/L (ref 3.5–5.3)
Sodium: 148 mmol/L (ref 133–146)

## 2014-05-08 LAB — VANCOMYCIN, TROUGH: Vancomycin Tr: 23 ug/mL (ref 10.0–20.0)

## 2014-05-08 LAB — VITAMIN B12: Vitamin B-12: >1500 pg/mL (ref 180–914)

## 2014-05-08 LAB — HEPATIC FUNCTION PANEL
ALT: 68 U/L (ref 7–52)
AST: 95 U/L (ref 13–39)
Albumin: 3.1 g/dL (ref 3.5–5.7)
Alkaline Phosphatase: 73 U/L (ref 36–125)
Bilirubin, Direct: 5.08 mg/dL (ref 0.00–0.40)
Bilirubin, Indirect: 2.72 mg/dL (ref 0.00–1.10)
Total Bilirubin: 7.8 mg/dL (ref 0.0–1.5)
Total Protein: 7.2 g/dL (ref 6.4–8.9)

## 2014-05-08 LAB — PROTIME-INR
INR: 1.4 (ref 0.9–1.1)
INR: 1.4 (ref 0.9–1.1)
Protime: 16.3 seconds (ref 11.6–14.4)
Protime: 16.9 seconds (ref 11.6–14.4)

## 2014-05-08 LAB — LACTIC ACID: Lactate: 2.9 mmol/L — ABNORMAL HIGH (ref 0.5–2.2)

## 2014-05-08 LAB — BLOOD CULTURE-PERIPHERAL: Culture Result: NO GROWTH

## 2014-05-08 LAB — MAGNESIUM
Magnesium: 2.6 mg/dL (ref 1.5–2.5)
Magnesium: 2.9 mg/dL (ref 1.5–2.5)
Magnesium: 2.6 mg/dL — ABNORMAL HIGH (ref 1.5–2.5)

## 2014-05-08 LAB — TROPONIN I
Troponin I: 2.59 ng/mL (ref 0.00–0.03)
Troponin I: 2.37 ng/mL — CR (ref 0.00–0.03)

## 2014-05-08 LAB — PHOSPHORUS: Phosphorus: 3.5 mg/dL (ref 2.1–4.7)

## 2014-05-08 LAB — CK, CARDIAC ENZYME: Total CK: 205 U/L (ref 30–223)

## 2014-05-08 MED ORDER — LORazepam (ATIVAN) 2 mg/mL injection
2 | Freq: Once | INTRAMUSCULAR | Status: AC
Start: 2014-05-08 — End: 2014-05-09

## 2014-05-08 MED ORDER — furosemide (LASIX) 1 mg/mL in sodium chloride 0.9 % 100 mL infusion
INTRAVENOUS | Status: AC
Start: 2014-05-08 — End: 2014-05-09
  Administered 2014-05-09 (×2): 10 mg/h via INTRAVENOUS

## 2014-05-08 MED ORDER — norepinephrine (LEVOPHED) 1 mg/mL injection
1 | INTRAVENOUS | Status: AC
Start: 2014-05-08 — End: 2014-05-09
  Administered 2014-05-09: 06:00:00 16

## 2014-05-08 MED ORDER — fentaNYL (SUBLIMAZE) 50 mcg/mL injection
50 | INTRAMUSCULAR | Status: AC
Start: 2014-05-08 — End: 2014-05-08
  Administered 2014-05-08: 21:00:00 50 via INTRAVENOUS

## 2014-05-08 MED ORDER — furosemide (LASIX) 120 mg in sodium chloride 0.9 % IVPB
Freq: Once | INTRAVENOUS | Status: AC
Start: 2014-05-08 — End: 2014-05-08
  Administered 2014-05-09: 02:00:00 120 mg via INTRAVENOUS

## 2014-05-08 MED ORDER — fentaNYL (SUBLIMAZE) 50 mcg/mL injection
50 | INTRAMUSCULAR | Status: AC
Start: 2014-05-08 — End: 2014-05-08
  Administered 2014-05-09: 02:00:00 50 via INTRAVENOUS

## 2014-05-08 MED ORDER — vancomycin (VANCOCIN) 750 mg in sodium chloride 0.9 % 150 mL IVPB
Freq: Once | INTRAVENOUS | Status: AC
Start: 2014-05-08 — End: 2014-05-09
  Administered 2014-05-09: 05:00:00 750 mg/kg via INTRAVENOUS

## 2014-05-08 MED ORDER — lidocaine 10 mg/mL (1 %) injection
10 | INTRAMUSCULAR | Status: AC
Start: 2014-05-08 — End: 2014-05-08
  Administered 2014-05-09: 02:00:00 200

## 2014-05-08 MED ORDER — fentaNYL (SUBLIMAZE) injection 50 mcg
50 | Freq: Once | INTRAMUSCULAR | Status: AC
Start: 2014-05-08 — End: 2014-05-08

## 2014-05-08 MED ORDER — piperacillin-tazobactam (ZOSYN) in dextrose, iso-osm 50 mL IVPB 3.375 g
3.375 | Freq: Two times a day (BID) | INTRAVENOUS | Status: AC
Start: 2014-05-08 — End: 2014-05-10
  Administered 2014-05-09 – 2014-05-10 (×4): 3.375 g via INTRAVENOUS

## 2014-05-08 MED ORDER — LORazepam (ATIVAN) 2 mg/mL injection
2 | INTRAMUSCULAR | Status: AC
Start: 2014-05-08 — End: 2014-05-09
  Administered 2014-05-09: 05:00:00 0.5 via INTRAVENOUS

## 2014-05-08 MED FILL — VANCOMYCIN 1 GRAM/200 ML IN DEXTROSE 5 % INTRAVENOUS PIGGYBACK: 1 1 gram/200 mL | INTRAVENOUS | Qty: 200

## 2014-05-08 MED FILL — FENTANYL (PF) 50 MCG/ML INJECTION SOLUTION: 50 50 mcg/mL | INTRAMUSCULAR | Qty: 5

## 2014-05-08 MED FILL — LORAZEPAM 2 MG/ML INJECTION SOLUTION: 2 2 mg/mL | INTRAMUSCULAR | Qty: 1

## 2014-05-08 MED FILL — HEPARIN (PORCINE) 5,000 UNIT/ML INJECTION SOLUTION: 5000 5,000 unit/mL | INTRAMUSCULAR | Qty: 1

## 2014-05-08 MED FILL — LIDOCAINE HCL 10 MG/ML (1 %) INJECTION SOLUTION: 10 10 mg/mL (1 %) | INTRAMUSCULAR | Qty: 20

## 2014-05-08 MED FILL — CEFEPIME 2 GRAM/100 ML IN DEXTROSE (ISO-OSMOTIC) INTRAVENOUS PIGGYBACK: 2 2 gram/100 mL | INTRAVENOUS | Qty: 100

## 2014-05-08 MED FILL — FUROSEMIDE 10 MG/ML INJECTION SOLUTION: 10 10 mg/mL | INTRAMUSCULAR | Qty: 12

## 2014-05-08 MED FILL — FUROSEMIDE 10 MG/ML INJECTION SOLUTION: 10 10 mg/mL | INTRAMUSCULAR | Qty: 4

## 2014-05-08 MED FILL — SENNA-S 8.6 MG-50 MG TABLET: 8.6-50 8.6-50 mg | ORAL | Qty: 1

## 2014-05-08 MED FILL — ASPIRIN 81 MG CHEWABLE TABLET: 81 81 MG | ORAL | Qty: 1

## 2014-05-08 MED FILL — FUROSEMIDE 10 MG/ML INJECTION SOLUTION: 10 10 mg/mL | INTRAMUSCULAR | Qty: 10

## 2014-05-08 MED FILL — VANCOMYCIN 1,000 MG INTRAVENOUS INJECTION: 1000 1000 mg | INTRAVENOUS | Qty: 750

## 2014-05-08 MED FILL — FAMOTIDINE 20 MG TABLET: 20 20 MG | ORAL | Qty: 1

## 2014-05-08 MED FILL — METOPROLOL TARTRATE 12.5 MG ORAL DOSE: 12.5 12.5 MG | ORAL | Qty: 1

## 2014-05-08 MED FILL — LEVOPHED 1 MG/ML INTRAVENOUS SOLUTION: 1 1 mg/mL | INTRAVENOUS | Qty: 16

## 2014-05-08 MED FILL — ZOSYN 3.375 GRAM/50 ML IN DEXTROSE (ISO-OSMOTIC) INTRAVENOUS PIGGYBACK: 3.375 3.375 gram/50 mL | INTRAVENOUS | Qty: 50

## 2014-05-08 NOTE — Unmapped (Signed)
Gabrielle Rivera is a 64 y.o. female patient.  1. Elevated liver enzymes    2. Acute respiratory failure with hypoxia and hypercarbia    3. Cardiogenic shock    4. AKI (acute kidney injury)    5. Acute respiratory failure with hypoxemia    6. Sepsis, due to unspecified organism    7. Cardiomyopathy    8. HCAP (healthcare-associated pneumonia)    9. NSTEMI (non-ST elevated myocardial infarction)      Past Medical History   Diagnosis Date   ??? CHF (congestive heart failure)    ??? Asthma    ??? Hypertension    ??? Hyperlipemia    ??? Stroke 2014   ??? Hepatitis C    ??? Dysphagia      Blood pressure 94/63, pulse 120, temperature 98.6 ??F (37 ??C), temperature source Axillary, resp. rate 25, height 5' 4 (1.626 m), weight 127 lb 6.8 oz (57.8 kg), SpO2 74 %.    Central Line  Date/Time: 05/08/2014 10:50 PM  Performed by: Caoilainn Sacks A  Authorized by: Hermelinda Dellen A  Consent: The procedure was performed in an emergent situation.  Patient identity confirmed: arm band  Time out: Immediately prior to procedure a time out was called to verify the correct patient, procedure, equipment, support staff and site/side marked as required.  Indications: vascular access  Anesthesia: local infiltration  Local anesthetic: lidocaine 1% without epinephrine  Anesthetic total: 5 ml  Patient sedated: no  Preparation: skin prepped with 2% chlorhexidine  Skin prep agent dried: skin prep agent completely dried prior to procedure  Sterile barriers: all five maximum sterile barriers used - cap, mask, sterile gown, sterile gloves, and large sterile sheet  Hand hygiene: hand hygiene performed prior to central venous catheter insertion  Location details: left femoral  Patient position: flat  Catheter type: triple lumen  Catheter size: 7 Fr  Pre-procedure: landmarks identified  Ultrasound guidance: yes  Number of attempts: 1  Successful placement: yes  Post-procedure: line sutured and dressing applied  Assessment: blood return through all ports  Patient tolerance:  Patient tolerated the procedure well with no immediate complications  Complications: none        Ronnika Collett A Shawnelle Spoerl  05/08/2014

## 2014-05-08 NOTE — Unmapped (Signed)
Problem: Non-violent, non-self-destructive restraints  Less restrictive alternative interventions will be considered prior to application of restraint devices.   Goal: Patient will be restrained only to maintain safety  Alternatives to restraints can include; moving the patient closer to the nurses station, video monitoring where available, medicating for pain, agitation or psychosis, psychosocial interventions, manipulation of environment, frequent toileting, diversional activities, bed alarms, having family members sit with patient, frequent reorientation, or repositioning.   Intervention: Attempt/consider less restrictive devices to maintain safety of patient.  Bilateral soft wrist restraints continued on patient for protection of medical devices.  Patient is unable to follow commands an medical devices must remain in place for direct medical healing. Will continue to assess and discontinue as tolerated.

## 2014-05-08 NOTE — Unmapped (Signed)
Department of Internal Medicine  Daily Progress Note      Chief Complaint / Reason for Follow-Up     Gabrielle Rivera is a 64 y.o. female on hospital day 5.  The principal reason for today's follow up visit is Acute respiratory failure with hypoxia and hypercarbia.       Interval History / Subjective     Patient was transferred from the MICU on 02/06 for further care of her encephalopathy. 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    This morning while evaluating the patient, multiple runs of V tach were reported by telemetry, patient now had an O2 requirement of 2L so an ECG was obtained which showed worsening ST depressions on lateral leads, troponins were obtained, which had increased to 2.6 from 1.1 from 3 days ago so Cardiology was consulted for evaluation. A bedside echo was obtained which showed a known reduced EF but no consistent regional wall motion abnormalities with her ECG. With these new findings it was decided to transfer the patient to a higher level of care.      Review of Systems (Focused)     As above    Medications     Scheduled Meds:  ??? aspirin  81 mg Oral Daily with breakfast   ??? atorvastatin  10 mg Oral Nightly (2100)   ??? ceFEPime (MAXIPIME) IV extended infusion  2 g Intravenous Q24H   ??? famotidine  20 mg Oral Daily 0900   ??? flu vaccine (seasonal < 65 yr)(PF) IM 0.5 mL  0.5 mL Intramuscular During  Hospitalization   ??? furosemide (LASIX) injection  40 mg Intravenous Daily 0900   ??? heparin (porcine)  5,000 Units Subcutaneous 3 times per day   ??? metoprolol tartrate  12.5 mg Oral BID   ??? senna-docusate  1 tablet Oral BID   ??? vancomycin  1,000 mg Intravenous Q24H     Continuous Infusions:   PRN Meds:  haloperidol lactate, ondansetron, vancomycin intermittent/pulse dosing       Vital Signs     Temp:  [97.4 ??F (36.3 ??C)-101.7 ??F (38.7 ??C)] 98.2 ??F (36.8 ??C)  Heart Rate:  [104-141] 119  Resp:  [20-44] 24  BP: (95-132)/(59-77) 107/75 mmHg  Arterial Line BP: (101-130)/(51-74) 120/66 mmHg    Intake/Output Summary (Last 24 hours) at 05/08/14 1241  Last data filed at 05/08/14 0526   Gross per 24 hour   Intake    780 ml   Output    405 ml   Net    375 ml         Physical Exam     General appearance: combative, delirious, moderate distress and uncooperative  Lungs: breath sounds clear in right lung, severely diminished in LLB  Heart: tachycardic HS, systolic murmur heard with radiation to patient's back  Abdomen: soft, non-tender; bowel sounds normal; no masses,  no organomegaly  Extremities:  extremities normal, atraumatic, no cyanosis or edema  Neurologic:Patient apparently oriented to self, not to place or time. Not cooperative with exam, only answering in the same way all the time. Doesn't appear to have any focal deficits.    Laboratory Data         Lab 05/08/14  0957 05/07/14  2013 05/07/14  0323 05/06/14  0204   WBC 10.3 8.1 7.1 6.3   HEMOGLOBIN, POC 10.2* 8.5* 9.4* 9.1*   HEMATOCRIT BLOOD GAS 33.2* 27.7* 30.4* 29.3*   MCV, POC 81.1 80.7 80.6 80.3   PLATELETS, POC 176 159 161 160           Lab 05/08/14  0957 05/07/14  2013 05/07/14  1245 05/07/14  0323   SODIUM 146 144 148* 145   POTASSIUM 4.7 4.3 4.3 4.2   CHLORIDE 111* 112* 110 112*   CARBON DIOXIDE (CO2) 19* 23 21 26    BUN 71* 62* 58* 47*   CREATININE 3.96* 3.10* 2.89* 2.13*   GLUCOSE 146* 186* 143* 104*           Lab 05/08/14  0957 05/07/14  2013 05/07/14  1936  05/07/14  1245 05/07/14  0323   CALCIUM 9.6 8.8  --  9.3 9.0   MAGNESIUM 2.9*  --  2.6* 2.5 2.5   PHOSPHORUS 3.8 3.6  --  4.0 3.7           Lab 05/07/14  2013 05/06/14  0526 05/05/14  0441 05/03/14  2359   INR POC 1.4* 1.4* 1.6* 1.8*   PROTHROMBIN TIME 16.3* 16.4* 18.6* 20.1*           Lab 05/08/14  0957 05/07/14  2013 05/06/14  0204 05/04/14  1240  05/03/14  2359   ALT  --   --   --   --   --  47   AST  --   --   --   --   --  69*   ALK PHOS  --   --   --   --   --  73   BILIRUBIN, TOTAL  --   --   --   --   --  4.4*   BILIRUBIN, DIRECT  --   --   --   --   --  3.10*   ALBUMIN 3.5 3.0* 2.6* 3.1*  < > 3.1*   3.1*   < > = values in this interval not displayed.        Lab 05/04/14  1729 05/04/14  0327   COLOR, URINE, POC Yellow Yellow   CLARITY, URINE, POC Clear Slightly-Cloudy*   SPECIFIC GRAVITY, URINE 1.008 1.009   PH UA 6.0 6.0   PROTEIN UA, POC Negative Negative   GLUCOSE UA, POC Negative Negative   KETONES UA, POC Negative Negative   BILIRUBIN UA, POC Negative Negative   BLOOD UA, POC Moderate* Large*   NITRITE UA Negative Negative   UROBILINOGEN UA, POC <2.0 <2.0   LEUKOCYTES UA, POC Trace* Negative   RBC UA 6* 7*   WBC UA 4 4   BACTERIA Rare* Rare*           Lab 05/08/14  0957 05/05/14  1139 05/04/14  2336 05/03/14  2359   CK TOTAL 205  --   --   --    TROPONIN I 2.59* 1.12* 1.04* 0.74*       Lab Results   Component Value Date    NTPROBNP 29541*  05/03/2014       Lab Results   Component Value Date    TSH 0.40 05/07/2014    FREET4 0.87 05/07/2014           Lab 05/07/14  1238 05/05/14  0958 05/04/14  2036 05/04/14  1553   GLU MONITORING DEVICE, POC 134* 106* 193* 199*           Diagnostic Studies     CXR  Serum studies as mentioned below  ECG      Assessment & Plan     Gabrielle Rivera is a 64 y.o. female on HD# 5 with Acute respiratory failure with hypoxia and hypercarbia.  The medical issues being addressed in today's encounter are as follows:    Principal Problem:    Acute respiratory failure with hypoxia and  hypercarbia  Active Problems:    Hepatitis C    AKI (acute kidney injury)    #) Encephalopathy  - Most likely multifactorial (acute liver failure, HCAP, ICU delirium)  - CT Head at OSH was negative for any bleeding or ischemia  - UDS at OSH was also unremarkable  - Patient's mental status is unchanged. At this moment, given there is concern for infection we would recommend a LP as blood and urine cultures have been negative up to this moment.    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 Troponins were up to 2.6 which was increase from 1.12 three days ago. A new ECG was also taken which showed possibly some worsening ST depression on anterolateral leads. Cardiology was consulted who did a bedside echo showing a known decreased EF but interestingly enough, there was normal lateral wall motion. No acute intervention was decided at that moment but cardiology will continue to follow, if troponins continue to rise then a new LHC will be warranted at that time.     #) Sepsis 2/2 HCAP  - CXR on admission showing right basilar atelectasis and/or pneumonia.  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 K    #) H/o CVA  - On 2ry prevention, ASA and statin        Nutrition:  Diet Orders          Diet dysphagia 1 Nectar Thick starting at 02/06 1110    Continuous tube feedings starting  at 02/04 1156          Code Status: Full Code    Signed:  Garreth Burnsworth  Rodman Pickle, MD  05/08/2014, 12:41 PM

## 2014-05-08 NOTE — Unmapped (Signed)
Elysa Womac is a 64 y.o. female patient.  1. Elevated liver enzymes    2. Acute respiratory failure with hypoxia and hypercarbia    3. Cardiogenic shock    4. AKI (acute kidney injury)    5. Acute respiratory failure with hypoxemia    6. Sepsis, due to unspecified organism    7. Cardiomyopathy    8. HCAP (healthcare-associated pneumonia)    9. NSTEMI (non-ST elevated myocardial infarction)      Past Medical History   Diagnosis Date   ??? CHF (congestive heart failure)    ??? Asthma    ??? Hypertension    ??? Hyperlipemia    ??? Stroke 2014   ??? Hepatitis C    ??? Dysphagia      Blood pressure 94/63, pulse 120, temperature 98.6 ??F (37 ??C), temperature source Axillary, resp. rate 25, height 5' 4 (1.626 m), weight 127 lb 6.8 oz (57.8 kg), SpO2 74 %.    Insert Arterial Line  Date/Time: 05/08/2014 10:51 PM  Performed by: Reinhold Rickey A  Authorized by: Hermelinda Dellen A  Consent: The procedure was performed in an emergent situation.  Site marked: the operative site was marked  Imaging studies: imaging studies available  Patient identity confirmed: arm band  Time out: Immediately prior to procedure a time out was called to verify the correct patient, procedure, equipment, support staff and site/side marked as required.  Preparation: Patient was prepped and draped in the usual sterile fashion.  Indications: multiple ABGs and hemodynamic monitoring  Location: left femoral  Anesthesia: local infiltration  Local anesthetic: lidocaine 1% without epinephrine  Anesthetic total: 5 ml  Patient sedated: no  Needle gauge: 18  Seldinger technique: Seldinger technique used  Ultrasound guidance used during line insertion: yes  Number of attempts: 1  Post-procedure: line sutured  Post-procedure CMS: normal  Patient tolerance: Patient tolerated the procedure well with no immediate complications  Complications: none        Bjorn Hallas A Carneshia Raker  05/08/2014

## 2014-05-08 NOTE — Unmapped (Signed)
Nursing-  Pt failed bedside swallow. Dr. Aline August notified. RN will hold 2100 PO meds. Will continue to monitor.    Sharia Reeve RN

## 2014-05-08 NOTE — Unmapped (Signed)
Reason for consult:   Troponin elevation, NSVT    HPI:  Gabrielle Rivera is a 64 y.o. Female on hospital day 5 with a history of CAD s/p DES (2013 per cardiology note at OSH), CVA (2014), HTN, HLD, CKD, Hepatitis C, HFrEF EF 15-20% s/p ICD 2013 who was admitted to an OSH with nausea, vomiting, abdominal pain?? and altered mental status with worsening chest pain/orthopnea before admission. She became increasingly encephalopathic and was ultimately intubated for airway protection. She was transferred to [facility] with concern for possible fulminant hepatitis with a worsening acute on chronic kidney injury and hyperkalemia.Cardiology was consulted at the time and RHC was performed which showed RASP of 7 and CI 4.3 ,PCWP 25 in cath lab  repeat bedside PCWP of 11.  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  She has been treated for HCAP, no lumbar puncture has been performed.    She was under ICU team care and then was transferred to medicine team, she was found to have respiratory failure, worsening mental status and NSVT on telemetry.  Troponin showed increased troponin to 2.59.    Cardiology team was consulted with new troponin elevation and NSVT.    Bedside echo was performed which did not shw any new WMA, she has unchanged  reduced EF.    Cardiac history: The patient has an extensive cardiac history per chart review.     - Last Perfusion study 03/2014 showed ??a large, moderate to severe, nonreversible inferior and inferoapical defect suggestive of infarct without ischemia.    - Progress notes mention a history of possible myocardial infarction ~2010.     - She had a stent placed ~2013    - ECHO 12/12/2012 revealed small pericardial effusion, moderate dilatation of the LV, severe  LV systolic dysfunction with EF 20-25%, mild LVH, severely dilated LA, moderate to severe mitral regurgitation, myxomatous degeneration of the mitral valve    - ICD placed 05/31/2013 [facility] [employer] [place] model [phone])    - ECHO 11/09/2013 revealed small pericardial effusion, severe left ventricle systolic dysfunction with ejection fraction of 15-20%, severe dilatation of the LV, moderate to severe mitral regurgitation, severely dilated left atrium, mild LVH    - ECHO 05/02/2014 Severe left ventricle systolic dysfunction with ejection fraction of 15-20%, Moderate to severe mitral regurgitation, mild concentric LVH, severe LV diastolic dysfunction, mildly enlarged LV, mildly reduced RV systolic function, severely dilated LA, moderately dilated RA, moderately elevated pulmonary artery systolic pressure (estimated right ventricular systolic pressure is moderately elevated at 60.8 mmHg), trivial pericardial effusion    - She has chronically elevated troponinemia ranging from 1.27 to 3.6 (peak value noted 12/17/2013)  - Chronically elevated proBNP, 34239 on admission to OSH    ??    Review of system:    NA  Past Hx:  Past Medical History   Diagnosis Date   ??? CHF (congestive heart failure)    ??? Asthma    ??? Hypertension    ??? Hyperlipemia    ??? Stroke 2014   ??? Hepatitis C    ??? Dysphagia        No past surgical history on file.    Previous cardiac investigations:    RHC :  HEMODYNAMIC FINDINGS/RHC:  RA pressure?? 9  RV pressure?? 52/9  PA pressure 55/28?? , PA mean 38  PCWP?? 25  RA sat?? 77%  RA Hgb?? 9.5  PA sat?? 77%  PA Hgb?? 8.9    ????????????????????????Fick  CO???????????? 7.1  CI???????????????? 4.3????????????  SVR????????831  PVR????????146    Results for orders placed during the hospital encounter of 05/03/14   Echo 2D Complete (TTE)    Narrative              * [facility]*                           [address]                          [place], [place] [address]                              [phone]    Transthoracic  Echocardiography    Patient:    Leshea, Jaggers  MR #:       [id number]  Account:  Study Date: 05/04/2014  Gender:     F  Age:        11  DOB:        Aug 22, 1950  Room:       [facility]     PERFORMING  Shon Hough, MD   ADMITTING   Shelhamer, Mehdi   OPERATOR    Ether Griffins, RDCS   ORDERING    Menner, Franky Macho    --------------------------------------------------------------------    Procedure:ECHO 2D      Order:ECHO 2D      Accession  COMPLETE (TTE)         COMPLETE (TTE)     Number:[id number]    --------------------------------------------------------------------  Indications:      Congestive Heart Failure.    --------------------------------------------------------------------  PMH:   Congestive heart failure.  Stroke.  PMH:  Asthma  Risk  factors:  Current tobacco use. Hypertension. Dyslipidemia.    --------------------------------------------------------------------  Study data:  No prior study was available for comparison.  Study  status:  Routine.  Procedure:  Transthoracic echocardiography. Image  quality was good. Scanning was performed from the parasternal,  apical, and subcostal acoustic windows.          Transthoracic  echocardiography.  M-mode, complete 2D, complete spectral Doppler,  and color Doppler.  Birthdate:  Patient birthdate: November 05, 1950.   Age:  Patient is 64yr old.  Sex:  Gender: female.  Height:  Height:  64in. Height: 64in.  Weight:  Weight: 132lb. Weight: 132lb.  Body  mass index:  BMI: 22.7kg/m^2.  Body surface area:    BSA: 1.39m^2.  Blood pressure:     86/58.  Patient status:  Inpatient.  Study date:   Study date: May 04, 2014.  Location:  Bedside.    --------------------------------------------------------------------  Study Conclusions    - Left ventricle: The cavity size was severely dilated. Wall    thickness was increased in a pattern of mild LVH. Systolic    function was severely reduced. The estimated ejection fraction was    in the range of 15% to 20%. Diffuse hypokinesis with  regional    variations.  - Mitral valve: Severe regurgitation directed eccentrically and    posteriorly.  - Left atrium: The atrium was severely dilated.  - Right ventricle: The cavity size was moderately to  severely    dilated. Wall thickness was normal. There is echocardiographic    evidence of pulsus alternans in the RVOT suggesting right    ventricualr dysfunction. Systolic function was moderately to    severely reduced by objective interpretation. TAPSE: 1.2cm.  - Atrial septum: The septum bowed from left to right, consistent    with increased left atrial pressure.  - Pulmonary arteries: Systolic pressure was moderately increased, =    56mm Hg assuming that the right atrial pressure was 20 mmHg.  - Pericardium, extracardiac: A small, free-flowing pericardial    effusion was identified along the right ventricular free wall. The    fluid had no internal echoes.There was no evidence of hemodynamic    compromise.    --------------------------------------------------------------------  Cardiac Anatomy    Left ventricle:    - The cavity size was severely dilated. Wall thickness was increased    in a pattern of mild LVH. Systolic function was severely reduced.    The estimated ejection fraction was in the range of 15% to 20%.    Diffuse hypokinesis with regional variations. MAPSE: 1cm.    - Wall motion score: 2.00.    Aortic valve:    - Trileaflet; normal thickness leaflets. Mobility was not    restricted.    Doppler:    - Transvalvular velocity was within the normal range. There was no    stenosis. No regurgitation.    - VTI ratio of LVOT to aortic valve: 0.48. Peak velocity ratio of    LVOT to aortic valve: 0.51.    - Mean gradient: 2mm Hg (S). Peak gradient: 5mm Hg (S).    Aorta:   Aortic root:    - The aortic root was normal in size.    Mitral valve:    - Structurally normal valve. Mobility was not restricted.    Doppler:    - Transvalvular velocity was within the normal range. There was no    evidence for  stenosis. Severe regurgitation directed eccentrically    and posteriorly.    - Mean gradient: 20mm Hg (D). Peak gradient: 39mm Hg (D).    Left atrium:    - The atrium was severely dilated.    Atrial septum:    - The septum bowed from left to right, consistent with increased    left atrial pressure.    Right ventricle:    - The cavity size was moderately to severely dilated. Wall thickness    was normal. Pacer wire or catheter noted in right ventricle.    Systolic function was moderately to severely reduced by objective    interpretation. TAPSE: 1.2cm.    Pulmonic valve:    Doppler:    - Transvalvular velocity was within the normal range. There was no    evidence for stenosis. Mild regurgitation.    - Peak gradient: 3mm Hg (S).    Tricuspid valve:    - Structurally normal valve.    Doppler:    - Transvalvular velocity was within the normal range. Mild    regurgitation.    Pulmonary artery:    - The main pulmonary artery was normal-sized. Systolic pressure was    moderately increased, = 56mm Hg assuming that the right atrial    pressure was 20 mmHg.    Right atrium:    - The atrium was normal in size.    Pericardium:    - A small, free-flowing pericardial effusion was identified along    the right ventricular  free wall. The fluid had no internal    echoes.There was no evidence of hemodynamic compromise.    Systemic veins:  Inferior vena cava:    - The vessel was dilated; the respirophasic diameter changes were    blunted (< 50%); findings are consistent with elevated central    venous pressure.  - Diameter: 2.57mm.    --------------------------------------------------------------------    Basic Measurements                                          Normal  IVC  Diameter                                        2.47 mm     <25  Left ventricle  LV internal dimension, ED, chordal level,     *74.48 mm     43-52  PLAX  LV internal dimension, ES, chordal level,      *69.4 mm     23-38  PLAX  Fractional shortening, chordal  level, PLAX        *7 %      >29  Major axis, ED, A4C                            77.09 mm     63-95  Major axis, ES, A4C                            78.98 mm     46-85  Major axis, ED, A2C                            76.03 mm     68-95  LV posterior wall thickness, ED                16.66 mm     --------  IVS/LVPW ratio, ED                              0.57        <1.3  Volume, ED, MOD, 1-plane                       316.2 ml     --------  Volume, ES, MOD, 1-plane                         258 ml     --------  Ejection fraction, MOD, 1-plane                18.38 %      --------  Stroke volume, MOD, 1-plane                     58.1 ml     --------  Volume index, ED, MOD, 1-plane                   191 ml/m^2 --------  Volume index, ES, MOD, 1-plane                   156 ml/m^2 --------  Stroke  index, MOD, 1-plane                      35.2 ml/m^2 --------  Ventricular septum  Septal thickness, ED                            9.52 mm     --------  Left atrium  Area ES, A4C                                   *46.9 cm^2   <20  Right atrium  Area, ES, A4C                                  16.46 cm^2   8.3-19.5  Right ventricle  RV internal dimension, ED, PLAX                28.86 mm     19-38     Basic Measurements                                          Normal  Left ventricle  LV internal dimension, ED                     *69.79 mm     <56  LV internal dimension, ES                     *65.25 mm     20-38  Fractional shortening                             *7 %      29-45  LV posterior wall, ED                         *19.29 mm     7-10  Septal/posterior wall ratio, ED                 0.65        --------  Relative wall thickness, ED                    *0.55        <0.45  Volume, ED, Teichholz                          293.7 ml     --------  Volume, ES, Teichholz                          250.5 ml     --------  Ejection fraction, Teichholz                  *14.71 %      64-83  Stroke volume, Teichholz                        43.2 ml      --------  Volume index, ED, Teichholz                     *  178 ml/m^2 56-155  Volume index, ES, Teichholz                     *152 ml/m^2 19-58  Stroke index, Teichholz                         26.2 ml/m^2 --------  Wall mass                                     *589.4 g      67-224  Wall mass index                                 *357 g/m^2  43-115  Mass/height                                     3.62 g/cm   --------  Ventricular septum  Septal thickness, ED                          *12.48 mm     7-10  Aorta  Root diameter, ED                              35.74 mm     <37  Left atrium  Anterior-posterior dimension, ES              *45.96 mm     <40  Anterior-posterior dimension index, ES         *2.78 cm/m^2 <2.2  LA/aortic root ratio                            1.29        --------  Right ventricle  RV internal dimension, ED                     *18.16 mm     <18     Doppler measurements                                        Normal  LVOT  Peak velocity, S                              *55.15 cm/s   70-110  VTI, S                                          7.43 cm     --------  Peak gradient, S                                   1 mm Hg  --------  Mean gradient, S  1 mm Hg  --------  Aortic valve  Peak velocity, S                              107.42 cm/s   100-170  Mean velocity, S                              *70.46 cm/s   <10  VTI, S                                         15.63 cm     --------  Mean gradient, S                                   2 mm Hg  <20  Peak gradient, S                                   5 mm Hg  <16  VTI ratio, LVOT/AV                              0.48        --------  Peak velocity ratio, LVOT/AV                   *0.51        70-110  Mitral valve  Peak E-wave velocity                         *150.05 cm/s   <120  Peak A-wave velocity                           91.97 cm/s   --------  Mean velocity, D                              180.71 cm/s    --------  Deceleration time                               *364 ms     150-250  Mean gradient, D                                 *20 mm Hg  <4  Peak gradient, D                                 *39 mm Hg  <11  Peak E/A ratio                                  1.63        --------  Tricuspid valve  Regurgitant peak velocity                    *299.39 cm/s   <  250  Peak RV-RA gradient, S                            36 mm Hg  --------  Pulmonic valve  Peak velocity, S                              *84.19 cm/s   <13  Peak gradient, S                                   3 mm Hg  <6    Legend:  Mean values are shown as u=mean value.  Asterisk (*) marks values outside specified normal range.  Reviewed and confirmed by    Shon Hough, MD  2016-02-03T20:37:[id number]     MPI 03/2014:    Findings:    Resting heart rate 110 beats per minute with a resting blood pressure of 113/78 mmHg. Peak heart rate 113 beats per minute with a peak blood pressure of 120/79 mmHg with Regadenoson stress. There were no Regadenoson induced ST changes diagnostic for ischemia. Baseline inferolateral ST and T-wave abnormality. PVCs including ventricular couplets. Expected hemodynamic response to Regadenoson stress.    Technical quality of the study was optimal..    There was a large, moderate to severe, nonreversible inferior and inferoapical defect suggestive of infarct without ischemia.    Stress induced transient left ventricular cavity dilation was not seen.    As compared to the previous study dated November 14, 2011, the perfusion pattern is overall unchanged.    LHC and RHC 2013 at [facility]    CORONARY FINDINGS  Right dominant  Left Main: No angiographically definable stenosis  LAD: No angiographically definable stenosis  Circumflex: No angiographically definable stenosis  RCA: luminal irregularities      LV / VALVE FINDINGS:  LV Gram - 25 % EF. Depressed LV systolic function - Severe. Normal EDP. :  Global hypokinesis - Severe    VALVE FINDINGS:  Mitral  valve insufficiency: grade 3-4. Tethered valve. Left atrial  enlargement.      OTHER:  Right heart pressures - Normal. Low filling pressures.  Time    O2 REST  PW 7/10 (7) PV 11:51:23  PA 15/10 (12) PA 11:51:43  RV 19/4, 6 11:54:40  RA 4/1 (0) 11:54:51  AO 0/0 (88) SA 12:02:44  AO 114/77 (93) 12:02:58  LV 127/6, 6 12:09:51  LVp 125/2, 14 12:11:36  AOp 124/78 (97) 12:11:41  Type SV CO (l/m) CI (l/m/ HR Time    O2 REST  Thermal 40.30 3.10 1.80 77 11:51:23            Home medications:  Prior to Admission medications    Not on File       Current medications:  Current Facility-Administered Medications   Medication Dose Frequency Provider Last Dose   ??? aspirin  81 mg Daily with breakfast Jerrye Bushy Menner, DO 81 mg at 05/08/14 0813   ??? atorvastatin  10 mg Nightly (2100) Glendora Score, DO 10 mg at 05/07/14 2021   ??? ceFEPime (MAXIPIME) IV extended infusion  2 g Q24H Avon Gully, MD 2 g at 05/07/14 1720   ??? famotidine  20 mg Daily 0900 [place] Michael Shapiro, MD 20 mg at 05/08/14 0813   ??? flu vaccine (seasonal < 65 yr)(PF) IM 0.5 mL  0.5 mL During Hospitalization Jerrye Bushy Menner, DO     ??? furosemide (LASIX) injection  40 mg Daily 0900 Roque Lias, MD 40 mg at 05/08/14 1610   ??? haloperidol lactate  5 mg Q6H PRN Harley Hallmark, MD 5 mg at 05/08/14 0146   ??? heparin (porcine)  5,000 Units 3 times per day Glendora Score, DO 5,000 Units at 05/08/14 9604   ??? metoprolol tartrate  12.5 mg BID Pollyann Savoy, MD 12.5 mg at 05/08/14 5409   ??? ondansetron  4 mg Q4H PRN Jerrye Bushy Menner, DO     ??? senna-docusate  1 tablet BID Jerrye Bushy [place], DO 1 tablet at 05/08/14 8119   ??? vancomycin  1,000 mg Q24H Jearld Shines, [place] 1,000 mg at 05/07/14 1405   ??? vancomycin intermittent/pulse dosing   UD PRN Mehdi C Shelhamer, DO           Allergies:   No Known Allergies      Family Hx:  No family history on file.      Social Hx:  Patient  reports that she has been smoking Cigarettes.  She has never used  smokeless tobacco. She reports that she uses illicit drugs. She reports that she does not drink alcohol.      Physical Exam:   GEN: confused  HEENT: atraumatic, normocephalic, sclera non-icteric, no palor  Pulm: Coarse BS  CV: RRR, S1 S2, 3/6  holosys murmure no S3 at this time  Neck: no JVD or carotid bruits  Abd: soft, non-tender, not distended, BS positive  Extr: warm and well perfused, 2+ pulses radial, femoral, DP, PT, no pitting edema        Laboratory:  Lab Results   Component Value Date    WBC 10.3 05/08/2014    HGB 10.2* 05/08/2014    HCT 33.2* 05/08/2014    MCV 81.1 05/08/2014    PLT 176 05/08/2014     Lab Results   Component Value Date    GLUCOSE 146* 05/08/2014    BUN 71* 05/08/2014    CREATININE 3.96* 05/08/2014    K 4.7 05/08/2014    NA 146 05/08/2014    CL 111* 05/08/2014    CALCIUM 9.6 05/08/2014    ALBUMIN 3.5 05/08/2014     Lab Results   Component Value Date    TSH 0.40 05/07/2014     Lab Results   Component Value Date    CKTOTAL 205 05/08/2014    TROPONINI 2.59* 05/08/2014     No results found for: LIPIDCOMM, CHOLTOT, TRIG, HDL, CHOLHDL, LDL      Assessment and plan:  Patient is a 64 y.o. 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  Consider her significant encephalopathy and her high cardiac output according to [facility] findings, cardiac etiology has a role in her decompensation but it is not felt to be the primary cause and we highly recommend more investigation to rule out any underlying untreated infection and consideration of CNS imaging and lumbar puncture.  Regarding her NSTEMI at this time considering her signigicant CRF we do not see any urgent need for cardiac catheterization. Will recommend guideline directed medical therapy, this for sure can be changed in case if she has significant  continues increase in her  troponin or worsening VT episodes. In that scenario LHC with accepting the risk for worsening renal failure and possible need for hemodialysis could be considered.  At this time we recommend to continue with ASA and statin  - Start heparin drip for at least 48 hours after CT head rule out any ICH.  - Consider amiodarone drip if repeated episodes of NSVT or if SVT.  - Continue BB and uptitrate as tolerated by BP(has been off dobutamine and milrinone from 05/06/2014)    Case discussed with the attending cardiologist Dr.Harris    Thank you for involving [place] in the care of this patient. Please contact us with any additional questions or concerns.  ?        Herbie Drape, MD  Cardiology Fellow

## 2014-05-08 NOTE — Unmapped (Signed)
CMU called at [phone] patient had 15 beats of Vtach Md notified . Patient  seems diaphoretic and having 10 seconds of apnea MD at bedside,nurse supervisor notified and en route .Patient transferred to higher level of care due to elevated troponin 2.59,5-15 beats of vtach and change in breathing pattern. Report given to MICU nurse.

## 2014-05-08 NOTE — Unmapped (Signed)
No acute changes during shift. Pt became agitated at 0300 and Halodol was administered. Pt is in 3 point restraints charted Q2 hours. Pt turned Q2 hours. No needs stated at this time.  alarm on, call light within reach. Will continue to monitor.    Gabrielle Rivera  6:09 AM  05/08/2014

## 2014-05-08 NOTE — Unmapped (Signed)
EKG completed. Placed in pt chart.

## 2014-05-08 NOTE — Unmapped (Signed)
Renal Consult Follow Up Note      Chief complaint on admission : concern for hepatic failure/AKI    Reason for Renal Consult : AKI       HPI:  Gabrielle Rivera is a 64 y.o. 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 As transfer from [facility] for continued evaluation of AMS/renal failure/liver dysfunction?? for whom we are consulted for AKI.    Patient recently diagnosed with uterine fibroid and was taking Tylenol for pain although levels were not largely elevated on admission to [facility].    Patient presented 05/02/14 to [facility] with N/V/abd pain noted to have elevated INR 1.5 and elevated T bili to 1.10. BUN/Cr on admission was 59/2.78 with baseline ~2.0.?? U/S Abd showed diffuse gallbladder thickening which couple imply cholecystitis without stones found.?? 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 Patient was taking Lasix 40 mg BID / Lisinopril 2.5 mg which were held on admission due to AKI; light hydration NS @ 50 ml/hr per chart.    Patient became altered and started having hallucinations and was sedated/intubated for imaging of head. CT head without acute findings.    Contrast exposure:?? None  Nephrotoxic drug exposure: None  Hypotensive episodes: None    Past medical, family, and social histories were reviewed as previously documented. Updates were made as necessary.    Interval History:   UO 475cc: worsening in Renal Cr  NSTEMI - elevation troponin 2.6    HISTORIES.    PFSH unchanged    ROS.  Confused cannot be obtained      PHYSICAL EXAM    Temp:  [97.4 ??F (36.3 ??C)-101.7 ??F (38.7 ??C)] 98.2 ??F (36.8 ??C)  Heart Rate:  [104-141] 116  Resp:  [20-44] 44  BP: (95-132)/(59-77) 107/75 mmHg  Arterial Line BP: (101-136)/(51-74) 120/66 mmHg    Wt Readings from Last 3 Encounters:   05/06/14 57.8 kg (127 lb 6.8 oz)         Intake/Output  Summary (Last 24 hours) at 05/08/14 1152  Last data filed at 05/08/14 0526   Gross per 24 hour   Intake    780 ml   Output    405 ml   Net    375 ml       Constitutional :AMS  Skin :   No rash, lesions, warm to touch  HENT:  Normocephalic, external ears and nose normal  Eyes :   no injection, icterus, EOM grossly intact.  Neck :   supple, No tracheal shift, no Thy mass  Chest :  No respiratory distress, On ventilator.     clear to auscultation, BS vesicular +0  CVS :   HR regular, HS S1 S2 + 0    LE edema neg  ABD :   non-distended, soft, non-tender,   Psych :  AMS      No Known Allergies    MEDS     Scheduled Meds:  ??? aspirin  81 mg Oral Daily with breakfast   ??? atorvastatin  10 mg Oral Nightly (2100)   ??? ceFEPime (MAXIPIME) IV extended infusion  2 g Intravenous Q24H   ??? famotidine  20 mg Oral Daily 0900   ??? flu vaccine (seasonal < 65 yr)(PF) IM 0.5 mL  0.5 mL Intramuscular During Hospitalization   ??? furosemide (  LASIX) injection  40 mg Intravenous Daily 0900   ??? heparin (porcine)  5,000 Units Subcutaneous 3 times per day   ??? metoprolol tartrate  12.5 mg Oral BID   ??? senna-docusate  1 tablet Oral BID   ??? vancomycin  1,000 mg Intravenous Q24H     Continuous Infusions:     PRN Meds:.haloperidol lactate, ondansetron, vancomycin intermittent/pulse dosing      DATA :       Date 05/07/14 0700 - 05/08/14 0659 05/08/14 0700 - 05/09/14 0659   Shift 0700-1459 1500-2259 2300-0659 24 Hour Total 0700-1459 1500-2259 2300-0659 24 Hour Total   I  N  T  A  K  E   I.V.  (mL/kg)  256  (4.4) 524  (9.1) 780  (13.5)          I.V.  256 524 780        Shift Total  (mL/kg)  256  (4.4) 524  (9.1) 780  (13.5)       O  U  T  P  U  T   Urine  (mL/kg/hr) 175  (0.4)  300  (0.6) 475  (0.3)          Output (mL) (Urethral Catheter Latex;Double-lumen) 175  300 475        Stool              Stool Occurrence 1 x   1 x        Shift Total  (mL/kg) 175  (3)  300  (5.2) 475  (8.2)       Weight (kg) 57.8 57.8 57.8 57.8 57.8 57.8 57.8 57.8       Lab Results    Component Value Date    CREATININE 3.96* 05/08/2014    BUN 71* 05/08/2014    NA 146 05/08/2014    K 4.7 05/08/2014    CL 111* 05/08/2014    CO2 19* 05/08/2014     Lab Results   Component Value Date    CALCIUM 9.6 05/08/2014    PHOS 3.8 05/08/2014     No results found for: [facility]  Lab Results   Component Value Date    WBC 10.3 05/08/2014    HGB 10.2* 05/08/2014    HCT 33.2* 05/08/2014    MCV 81.1 05/08/2014    PLT 176 05/08/2014     Lab Results   Component Value Date    IRON 17* 05/04/2014    TIBC 377 05/04/2014    FERRITIN 42.4 05/04/2014     Lab Results   Component Value Date    KETONESU Negative 05/04/2014    BLOODU Moderate* 05/04/2014    BILIRUBINUR Negative 05/04/2014    UROBILINOGEN <2.0 05/04/2014    PROTEINUA Negative 05/04/2014    NITRITE Negative 05/04/2014    LEUKOCYTESUR Trace* 05/04/2014    PHUR 6.0 05/04/2014    LABSPEC 1.008 05/04/2014    CLARITYU Clear 05/04/2014    COLORU Yellow 05/04/2014           In addition to the above, I have reviewed an extensive amount of complex data.    Morbidity / complication risk is felt to be :  high.      Assessment/Plan:   Gabrielle Rivera is a 64 y.o. 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 As transfer from [facility] for continued evaluation of AMS/renal failure/liver dysfunction?? for whom we are  consulted for AKI.    #.) Anuric AKI on CKD  - Baseline SCr 2.5 likely 2/2 poor perfusion CHF/HTN; R kidney ~ 9 cm at OSH suggesting chronic disease  - Unclear proteinuria prior to AKI; no records in system  - Suspect AKI etiology pre-renal to ATN on ACE vs. CRS vs. HRS  - UNa at OSH 28; suggesting pre-renal picture but unlikely HRS especially given liver not acutely decompensated  - Insults include: Lisinopril 2.5 mg at home stopped at OSH. Depressed cardiac function.   - Cr is improving, almost back to baseline. Patient is diuresing well without IV diuretics. Dobutamine has been weaned  of, still on some levophed.     Concern for acute hepatic failure:  Evaluated by hepatology. Patient likely has chronic liver disease due to Hep C. Current liver dysfunction is likely due to hepatic congestion secondary to poor cardiac function.     Plan:   - Worsening in renal function since yesterday: She had NSTEMI this morning  Which could explain the worsening in renal function.   - Due to serious medical condition secondary to ACS, will be ok to take to cath lab if needed given benefits are more than the risk. If she goes for a cath it is like going to make her renal function worse but benefits outweigh the risk at this time.     Thank you for the consult.      [facility]  Treavor Blomquist  05/08/2014    Discussed with Consult staff Dr Olga Millers

## 2014-05-08 NOTE — Unmapped (Signed)
Nursing-  MICU fellow bedside to place arterial line and central line. Time out called and sterile technique used. Both lines placed without complications. Sterile dressing placed. Will continue to monitor.    Sharia Reeve RN

## 2014-05-08 NOTE — Unmapped (Signed)
Problem: Compromised Skin Integrity  Use this problem when pressure ulcers are classified as stage I or II.   Goal: Skin integrity is maintained or improved  Assess and monitor skin integrity. Identify patients at risk for skin breakdown on admission and per policy. Collaborate with interdisciplinary team and initiate plans and interventions as needed.   Intervention: Turn patient  Patient arrived to bed and turn by 2 nurses and skin assessed.  No breakdown on coccyx.  All bony prominences assessed for breakdown.  Patient frequently moves in bed and is at risk for shearing.  Will continue to monitor.

## 2014-05-08 NOTE — Unmapped (Signed)
Patient admitted from [facility] on monitor with RN.  Patient was connected to monitor vitals 103/62 HR 126 and 95% on 2 L NC.  Patient wakes with stimulation but is unable to follow commands, can be redirected.  All needs met at this time. Will continue to monitor.

## 2014-05-08 NOTE — Unmapped (Signed)
Nursing-  Pt is alert to person, follows very simple commands only, and moves all extremities. Pt grunts and her speech is incomprehensible. Pt is very anxious with care. Will continue to monitor.    Sharia Reeve RN

## 2014-05-08 NOTE — Unmapped (Signed)
MICU PROGRESS NOTE        Transfer accept note    05/08/2014 12:39 PM Patient: Gabrielle Rivera LOS: 5 days     Significant events over the past 24 hours include:  -concern for troponin elevation to 2.59, checked due to nonsustained Vtach on floor. EKG changes w/ ST depression changes in lateral leads.   -redosed w/ lasix IV 40mg  this am. Received dose yesterday morning and the night prior. Before yesterday's morning dose, her Cr had been improving, however Cr went up after yesterday's morning dose  -Evaluated by Cardiology, who still believe her issues are related to sepsis on top of her stable cardiomyopathy. They do not believe her heart issue would be related to her encephalopathy given relatively stable MAP  -continues to be altered however looks similar to when she was in MICU yesterday    -I discussed with the son about altered mental changes - he states at baseline pt lives with him and able to perform own ADLs, interactive and conversant, no weakness from previous L MCA stroke except only c/b expressive aphasia which has improved after speech therapy. Her mental status started changing about a week prior to the admission to [facility] (admitted 05/02/14), and was noted to appear agitated/anxious mainly at night, complaining she was short of breath when laying down; at that time, she also had issues passing urine and bowel movements. She seemed to have acutely gotten worse when she was at [facility] but son thought perhaps it was related to medications given (pt given ativan) as pt acted very aggressive and was very loud and restless, yelling for help that she couldn't breathe. No sick contacts prior and pt did not complain to him of any ill symptoms recently. Medication-wise, recent changes included her PCP taking pt abruptly off xanax (0.5mg  qhs prn) and norco (5-325mg  qday prn) for arthritic pain the third week of January and exchanged for trazodone and vistaril for anxiety and tramadol for pain. Tramadol  did not seem to help thus pt was taking tylenol 4 extra strength tabs 4 to 5 times a day during that week. Son did note that after pt was extubated on 05/06/14, he did seem to have a normal conversation with her that day.      DAILY PLAN  -Check CT head given continued altered state. At OSH CT head unremarkable. As we are unclear why she is septic, will also obtain CT chest/abd/pelv w/o con (given elevated Cr)  -redraw cultures. CXR obtained  -other than the one time fever post-extubation, pt has been afebrile, wbc wnl, and cultures negative, will d/c vancomycin/cefepime for now and follow clinically. If febrile again and/or hemodyn unstable, add vancomycin/zosyn, as cefepime may cloud the mental status picture  -trend troponins. Repeat EKG. If Kirby Funk becomes sustained, will use amiodarone  -check B12 to continue AMS workup. Thyroid studies unremarkable. Recheck LFT  -stop IV lasix as this likely contributed to worsened AKI. Will assess volume status with bedside ultrasound. Renal and cardiology following   -check lactate  -consult neurology on AMS      ===========================================================  ASSESSMENT    64yo F HFrEF EF 15-20% s/p ICD, CAD s/p DES, CVA, HTN, HLD, CKD stage 3 transferred from [facility] 05/03/14 w/ concerns for fulminant hepatic failure, found to actually be in cardiogenic shock with Biventricular heart failure and possibly septic shock    Neurologic  Metabolic encephalopathy - S/p Intubation and sedation w/ ketamine and fentanyl/propofol gtt. Extubated  night of 05/06/14. Potential  causes - ICU or hypoactive delirium, infection, medications, withdrawal or underlying psych issue. Cardiology does not think AMS related to heart as MAPs relatively stable  -Thyroid studies unremarkable  -d/c vanc/cefepime today. Redraw cultures. cxr obtained  -CT head osh neg. Will repeat CT head here, as well as obtain CT chest/abd/pelv as no clear source of sepsis  -OSH drug tox urine  negative  -check B12, recheck LFT  -consult neurology for thoughts on altered mental status. With son's story, pt sounded like she had waxing/waning altered mental status - delirium related to meds/withdrawal/chf exacerbation/aki/possible infection? Please see above in significant events over 24hrs for details of conversation     Hx CVA  - asa and statin    Pulmonary  S/p intubation for airway protection at OSH prior to transfer for encephalopathy (extubated night of 05/06/14). Now satting well on 2L NC    Cardiovascular   NSTEMI  -Evaluated by Cardiology, who still believe her issues are related to sepsis on top of her stable cardiomyopathy. They do not believe her heart issue would be related to her encephalopathy given relatively stable MAP  -trend troponins. Repeat EKG. If Kirby Funk becomes sustained, will use amiodarone  -potentially related to ischemia from hypovolemia from overdiuresis as Cr increased after 2nd and 3rd rounds of IV lasix 40mg  which we will stop now and assess volume status with beside ultrasound    Vtach, nonsustained - noted on floor. If sustained, will use amiodarone    HFrEF  -EF 15-20%. Biventricular HF  - possibly dryer side now - check volume status w/ bedside ultrasound  -redosed w/ lasix IV 40mg  this am. Received dose yesterday morning and the night prior. Before yesterday's morning dose, her Cr had been improving, however Cr went up after yesterday's morning dose  -stop IV lasix as this likely contributed to worsened AKI. Will assess volume status with bedside ultrasound. Renal and cardiology following   -holding lisinopril given aki/low BP    Shock: resolved. Cardiogenic +/- septic shock in setting of HFrEF - EF 15-20%. Initial swan reports concerning for both cardiogenic and septic shock however evaluation of later swan numbers suggested heart was compensated per cardiology, however at the same time the Ernestine Conrad was giving contradictory numbers and malpositioned. Complicated by congestive  hepatopathy and AKI. Per cardiology they feel issue is likely sepsis on top of stable cardiomyopathy  -s/p levophed and dobutamine.   -daily weights, strict I/O. Foley in place  -Vancomycin/cefepime started 05/04/14. pancultured neg prior MICU re-arrival.   -other than the one time fever post-extubation, pt has been afebrile, wbc wnl, and cultures negative, will d/c vancomycin/cefepime for now and follow clinically. If febrile again and/or hemodyn unstable, add vancomycin/zosyn, as cefepime may cloud the mental status picture  -hold off on fluids for now    -RHC 05/04/14  RA pressure?? 9  RV pressure?? 52/9  PA pressure 55/28?? , PA mean 38  PCWP?? 25  RA sat?? 77%  RA Hgb?? 9.5  PA sat?? 77%  PA Hgb?? 8.9    ????????????????????????Fick  CO???????????? 7.1  CI???????????????? 4.3????????????  SVR????????831  PVR????????146      CAD - asa, statin; added back beta blocker 05-07-14    Gastrointestinal  Congestive hepatopathy  -likely a result of heart failure and sepsis  -U/S abdomen ordered  -Liver agrees likely 2/2 heart failure plus sepsis. Rec checking HCV RNA  -3day vit k challenge w/ improvement of INR however also in setting of improved clinical status in general  -recheck  LFT to reassess    HCV Ab + : check HCV RNA quant    Chronic acalculous cholecystitis - HIDA scan w/ gallbladder wall thickening. No pericholecystic fluid collections    GI ppx - pt will be intubated >2 days. pepcid bid IV    Renal  AKI - on arrival, likely related to cardiac dysfunction and sepsis.   -redosed w/ lasix IV 40mg  this am. Received dose yesterday morning and the night prior. Before yesterday's morning dose, her Cr had been improving, however Cr went up after yesterday's morning dose  -stop IV lasix as this likely contributed to worsened AKI. Will assess volume status with bedside ultrasound. Renal and cardiology following . Renal following pt. Pt has been consented for vas cath if needed by son, however due to improvement will not be needed.   -assess fluid status w/ bedside  ultrasound    Acid/Base   No acute issues    Genito-Urinary  Foley in place. Autodiuresing, likely component of ATN. We have given IV lasix 40mg  x2 with continued improvement in volume status and creatine, appears euvolemic at this time    Infectious Disease  Septic shock?  -s/plevophed.   -assess volume status w/ bedside u/s  -checking pan-scan CT as source unclear  -Vancomycin/cefepime empirically started 05/04/14. pancultured neg thus far but will repeat cultures  -other than the one time fever post-extubation, pt has been afebrile, wbc wnl, and cultures negative, will d/c vancomycin/cefepime for now and follow clinically. If febrile again and/or hemodyn unstable, add vancomycin/zosyn, as cefepime may cloud the mental status picture    Hep C ab +: check HCV RNA    Endocrine  Hyperglycemia - keep glucose ~140-180    Hematology/Oncology  Coagulopathy - INR on arrival elevated - likely 2/2 congestive hepatopathy plus likely malnutrition. plt dysfunction from uremia  -Vit K challenge x3days improved INR    Psychiatry  See neuro    Fluid/Electrolytes/Nutrition   Intravascular volume assessment: may be dry. Assess bedside u/s  IVF: address after bedside u/s assessment  Electrolyte Abnormalities: replete. Keep watch on Mg ~2 and K ~4  Nutrition: dysphagia     Prophylaxis  DVT Prophylaxis: sqh  GI Prophylaxis: pepcid bid iv    Code Status Full Code    PT/OT/Reanimation    Family Communication  D/w son, states pt would want aggressive measures    Social Work    Disposition  MICU    ===========================================================  CODE STATUS: Full Code     ALLERGIES:No Known Allergies    Lines/Tubes (day):        Foley 05/03/14       CAM: Overall CAM-ICU : Delirium Present  RASS: [place] Agitation Sedation Scale: +1    DRIPS   Vasopressors:   Vasodilators:   Inotropes:    Other:      INs & OUTs       Intake/Output Summary (Last 24 hours) at 05/08/14 1239  Last data filed at 05/08/14 0526   Gross per 24 hour    Intake    780 ml   Output    405 ml   Net    375 ml       VENTILATION          Lab Results   Component Value Date    PHART 7.39 05/06/2014    PCO2 46* 05/06/2014    PO2ART 101* 05/06/2014    HCO3ART 28* 05/06/2014    BEART 2.6 05/06/2014    HBO2PER 91.9*  05/06/2014       VITALS   Temp (24hrs), Avg:99 ??F (37.2 ??C), Min:97.4 ??F (36.3 ??C), Max:101.7 ??F (38.7 ??C)    Patient Vitals for the past 4 hrs:   BP Temp Temp src Pulse Resp SpO2   05/08/14 1226 - - - 119 24 97 %   05/08/14 1100 107/75 mmHg 98.2 ??F (36.8 ??C) Axillary 116 (!) 44 98 %   05/08/14 1058 - - - - - 95 %   05/08/14 0900 103/73 mmHg 98.5 ??F (36.9 ??C) Axillary 120 24 96 %       PHYSICAL EXAM  General: Regards. Said hi when we greeted her. Doesn't follow commands.   Neurologic: Regards. Said hi when we greeted her. Doesn't follow commands.   Pulmonary: clear bilaterally  Cardiovascular: tachycardic. Systolic murmur.   Abdominal: soft, nt/nd, no masses  Extremities: no pedal edema bilat      ANTIBIOTICS  Vanc/cefepime 05/04/14. Will stop today; if hemodyn unstable or fever add vanc/zosyn    NUTRITION  Enteral feeds    LABS   Lab Results   Component Value Date    GLUCOSE 146* 05/08/2014    BUN 71* 05/08/2014    CREATININE 3.96* 05/08/2014    K 4.7 05/08/2014    NA 146 05/08/2014    CL 111* 05/08/2014    CALCIUM 9.6 05/08/2014      Lab Results   Component Value Date    MG 2.9* 05/08/2014      Lab Results   Component Value Date    PHOS 3.8 05/08/2014     Lab Results   Component Value Date    ALKPHOS 73 05/03/2014    ALT 47 05/03/2014    AST 69* 05/03/2014    BILITOT 4.4* 05/03/2014    ALBUMIN 3.5 05/08/2014    BILIDIRECT 3.10* 05/03/2014    PROT 7.1 05/03/2014     Lab Results   Component Value Date    WBC 10.3 05/08/2014    HGB 10.2* 05/08/2014    HCT 33.2* 05/08/2014    MCV 81.1 05/08/2014    PLT 176 05/08/2014     No results found for: ANTIXAF No results found for: PTT   Lab Results   Component Value Date    INR 1.4* 05/07/2014    INR 1.4* 05/06/2014    INR  1.6* 05/05/2014    PROTIME 16.3* 05/07/2014    PROTIME 16.4* 05/06/2014    PROTIME 18.6* 05/05/2014    No results found for: FIBRINOGEN No results found for: TEGANGLE, TEGKTIME, TEGLYSIS30, TEGMAXAMPL, TEGRTIME, CBMZ  FSBS Range: No results found for: GLUFAST    MICROBIOLOGY  05/04/14 bcx peripheral x2 ngtd  05/04/14 bcx line ngtd  05/04/14 ucx ngtd    ===========================================================    Case staffed w/ attending Dr. Valarie Cones  Raghad Lorenz, MD  12:39 PM

## 2014-05-08 NOTE — Unmapped (Signed)
Nursing-  Pt is very anxious and is not currently redirectable. HR to the 140s and RR in the 40s. Pt given 1X dose of Ativan IVP. Anxiety resolved. Will continue to monitor.

## 2014-05-09 ENCOUNTER — Inpatient Hospital Stay: Admit: 2014-05-10 | Payer: PRIVATE HEALTH INSURANCE

## 2014-05-09 LAB — BASIC METABOLIC PANEL
Anion Gap: 12 mmol/L (ref 3–16)
BUN: 85 mg/dL (ref 7–25)
CO2: 23 mmol/L (ref 21–33)
Calcium: 9.1 mg/dL (ref 8.6–10.3)
Chloride: 114 mmol/L (ref 98–110)
Creatinine: 4.42 mg/dL (ref 0.60–1.30)
GFR MDRD Af Amer: 12 See note.
GFR MDRD Non Af Amer: 10 See note.
Glucose: 120 mg/dL (ref 70–100)
Osmolality, Calculated: 335 mOsm/kg (ref 278–305)
Potassium: 4.2 mmol/L (ref 3.5–5.3)
Sodium: 149 mmol/L (ref 133–146)

## 2014-05-09 LAB — CBC
Hematocrit: 28.4 % (ref 35.0–45.0)
Hemoglobin: 8.6 g/dL (ref 11.7–15.5)
MCH: 24.5 pg (ref 27.0–33.0)
MCHC: 30.3 g/dL (ref 32.0–36.0)
MCV: 81 fL (ref 80.0–100.0)
MPV: 8.8 fL (ref 7.5–11.5)
Platelets: 184 10*3/uL (ref 140–400)
RBC: 3.51 10*6/uL (ref 3.80–5.10)
RDW: 18.6 % (ref 11.0–15.0)
WBC: 8.7 10*3/uL (ref 3.8–10.8)

## 2014-05-09 LAB — VENOUS BLOOD GAS, LINE/SYRINGE
%HBO2-Line Draw: 62.2 % (ref 40.0–70.0)
Base Excess-Line Draw: -0.4 mmol/L (ref <=3.0)
CO2 Content-Line Draw: 26 mmol/L (ref 25–29)
Carboxyhgb-Line Draw: 2.1 %
HCO3-Line Draw: 25 mmol/L (ref 24–28)
Methemoglobin-Line Draw: 0 % (ref 0.0–1.5)
PCO2-Line Draw: 43 mm Hg (ref 41–51)
PH-Line Draw: 7.37 (ref 7.32–7.42)
PO2-Line Draw: 42 mm Hg (ref 25–40)
Reduced Hemoglobin-Line Draw: 36 % (ref 0.0–5.0)

## 2014-05-09 LAB — BLOOD GAS, ARTERIAL
%HBO2, Arterial: 91.6 % (ref 95.0–98.0)
Base Excess, Arterial: -3.4 mmol/L (ref <=3.0)
CO2 Content,Arterial: 21 mmol/L (ref 23–27)
Carboxyhemoglobin, Arterial: 2.2 %
HCO3, Arterial: 20 mmol/L (ref 22–26)
Methemoglobin, Arterial: 0.5 % (ref 0.0–1.5)
Reduced hemoglobin, Arterial: 5.7 % (ref 0.0–5.0)
pCO2, Arterial: 27 mm Hg (ref 35–45)
pH, Arterial: 7.48 (ref 7.35–7.45)
pO2, Arterial: 69 mm Hg (ref 80–100)

## 2014-05-09 LAB — TROPONIN I: Troponin I: 2.87 ng/mL (ref 0.00–0.03)

## 2014-05-09 LAB — SODIUM, URINE, RANDOM: Sodium, Ur: 120 mmol/L

## 2014-05-09 LAB — LACTIC ACID: Lactate: 1 mmol/L (ref 0.5–2.2)

## 2014-05-09 LAB — BLOOD CULTURE-PERIPHERAL: Culture Result: NO GROWTH

## 2014-05-09 LAB — PHOSPHORUS: Phosphorus: 4.7 mg/dL (ref 2.1–4.7)

## 2014-05-09 LAB — CREATININE, URINE, RANDOM: Creatinine, Urine: 41.5 mg/dL

## 2014-05-09 LAB — CK: Total CK: 240 U/L (ref 30–223)

## 2014-05-09 LAB — VANCOMYCIN, RANDOM: Vancomycin Random: 43.2 ug/mL

## 2014-05-09 LAB — UREA NITROGEN, URINE: Urea Nitrogen, Ur: 262 mg/dL

## 2014-05-09 LAB — SYPHILIS MONITORING (RPR W TITER): RPR Monitoring Screen: NONREACTIVE

## 2014-05-09 LAB — MAGNESIUM: Magnesium: 2.8 mg/dL (ref 1.5–2.5)

## 2014-05-09 MED ORDER — haloperidol lactate (HALDOL) injection 5 mg
5 | Freq: Once | INTRAMUSCULAR | Status: AC
Start: 2014-05-09 — End: 2014-05-09

## 2014-05-09 MED ORDER — LORazepam (ATIVAN) 2 mg/mL injection
2 | Freq: Once | INTRAMUSCULAR | Status: AC
Start: 2014-05-09 — End: 2014-05-09

## 2014-05-09 MED ORDER — haloperidol lactate (HALDOL) injection 5 mg
5 | Freq: Once | INTRAMUSCULAR | Status: AC
Start: 2014-05-09 — End: 2014-05-09
  Administered 2014-05-10: 05:00:00 5 mg via INTRAMUSCULAR

## 2014-05-09 MED ORDER — metoprolol tartrate (LOPRESSOR) tablet 25 mg
25 | Freq: Two times a day (BID) | ORAL | Status: AC
Start: 2014-05-09 — End: 2014-05-11
  Administered 2014-05-10 – 2014-05-11 (×4): 25 mg via ORAL

## 2014-05-09 MED ORDER — haloperidol lactate (HALDOL) 5 mg/mL injection
5 | INTRAMUSCULAR | Status: AC
Start: 2014-05-09 — End: 2014-05-09
  Administered 2014-05-10: 05:00:00 5 via INTRAVENOUS

## 2014-05-09 MED ORDER — LORazepam (ATIVAN) 2 mg/mL injection
2 | INTRAMUSCULAR | Status: AC
Start: 2014-05-09 — End: 2014-05-09
  Administered 2014-05-09: 12:00:00 0.5 via INTRAVENOUS

## 2014-05-09 MED ORDER — electrolyte-R (pH 7.4) (NORMOSOL-R pH 7.4) iv solution SolP
INTRAVENOUS | Status: AC
Start: 2014-05-09 — End: 2014-05-09
  Administered 2014-05-09: 15:00:00 250 mL/h via INTRAVENOUS

## 2014-05-09 MED FILL — ZOSYN 3.375 GRAM/50 ML IN DEXTROSE (ISO-OSMOTIC) INTRAVENOUS PIGGYBACK: 3.375 3.375 gram/50 mL | INTRAVENOUS | Qty: 50

## 2014-05-09 MED FILL — METOPROLOL TARTRATE 25 MG TABLET: 25 25 MG | ORAL | Qty: 1

## 2014-05-09 MED FILL — HEPARIN (PORCINE) 5,000 UNIT/ML INJECTION SOLUTION: 5000 5,000 unit/mL | INTRAMUSCULAR | Qty: 1

## 2014-05-09 MED FILL — FUROSEMIDE 10 MG/ML INJECTION SOLUTION: 10 10 mg/mL | INTRAMUSCULAR | Qty: 10

## 2014-05-09 MED FILL — HALOPERIDOL LACTATE 5 MG/ML INJECTION SOLUTION: 5 5 mg/mL | INTRAMUSCULAR | Qty: 1

## 2014-05-09 MED FILL — LORAZEPAM 2 MG/ML INJECTION SOLUTION: 2 2 mg/mL | INTRAMUSCULAR | Qty: 1

## 2014-05-09 NOTE — Unmapped (Signed)
Speech Language/Pathology  Progress Note  Name: Gabrielle Rivera  DOB: July 19, 1950  Attending Physician: Daiva Huge, MD  Admission Diagnosis: Encephalopathy [G93.40]  Date: 05/09/2014  Hospital Course SLP: Patient is a 64yo female who presents with history of HFrEF EF 15-20%, CAD s/p DES, CVA, HTN, HLD and stage III CKD transferred from [facility] with concerns for fulminant hepatic failure on 05/03/14. Intubated 2/2 - 2/5. CXR (05/05/14): left lower lobe collapse and airspace opacity at right base, atelectasis or infiltrate. SLP History-none.     Assessment:   Patient continues to demonstrate oropharyngeal dysphagia secondary to reduced oral-motor status and delayed swallow initiation, complicated by weak cough, cognition, and poor coordination between respiration and deglutition. Patient demonstrated in signs and symptoms of penetration/aspiration with puree. No s/s of penetration/aspiration with thin liquids. Based on today's re-evaluation NPO with TF is recommended. Patient is at an increased risk of aspiration.    Plan:  1. SLP therapy 1-3x/week while inpatient   2. SLP at discharge is recommended   3. Diet Recommended: NPO with TF, good oral care, small sips of water okay after oral care.    Subjective:  Alert and Oriented x1 with no s/s or c/o pain at this time. Patient lethargic requiring verbal and tactile cues to participate in PO trials.    Objective:  PO Trials Administered:     Consistency   Number/Order of Trials  Signs/Symptoms Observed    Thin Liquid 1-6, 9-10 No s/s    Puree 7-8 Prolonged oral holding, immediate cough x2    Soft Solid DNT     Hard Solid DNT      Goals  1. Patient will tolerate least restrictive diet with no signs or symptoms of aspiration at 100% accuracy: with min cues.   >In progress; patient demonstrating s/s of pen/asp with puree  2. Patient will demonstrate use of swallowing exercises: for oral and pharyngeal strengthening at 100% accuracy with min cues.   >Attempted;  patient unable maintain an alert state long enough to participate and follow commands  3. Patient and/or caregiver will demonstrate ability to prepare food and liquid consistencies at 100% accuracy: with min cues.   >Not addressed this date  4. Patient and/or caregiver will demonstrate understanding of clinical signs and symptoms of aspiration at 100% accuracy: with min cues.  >>In progress; patient unable to demonstrate understanding despite max cues   5. Patient and/or caregiver will demonstrate understanding of risks associated with aspiration at 100% accuracy: with min cues.   >Not addressed this date  Time frame for goal to be met in: 2.19.16    Education:  Patient educated on role of SLP, current POC, and discharge recommendations for SLP therapy.   Patient educated on swallowing anatomy & physiology, dysphagia, risks of aspiration, current diet recommendations, and safe swallowing behaviors.   Patient was unable to demonstrate understanding.    Lisabeth Devoid MS, CCC-SLP  Speech Language Pathologist   MBSImP Certified Clinician  Pager: [phone]  Office: [phone]  Hours: M,T,W,F 0830-1800    Time  Start Time: 1110  Stop Time: 1127  Time Calculation (min): 17 min    Charges   $Swallow Tx : 1 Procedure

## 2014-05-09 NOTE — Unmapped (Signed)
ICU ATTENDING PROGRESS NOTE    I have examined Gabrielle Rivera, reviewed the events of the previous 24 hours, reviewed, confirmed and amended the resident's data, history and physical exam as needed. The case has been discussed with the MICU team. I note the following in my assessment and will implement this plan of coordinated critical care management as follows:    I note the following: pt is a 64 yo admitted HD 6, with heart failure and severe valve disease --not surgically amendable with altered mental status acute hepatic congestion and AKI    Family meeting given overall poor prognosis   Will only be able to adjust physiologically so much given her end stage disease  Over dieresis need IVF  vigeilo and monitor scvo2  Monitor o2 needs pul toilet  Chronic VT    Total 7 d of abx for infection pna          RESPIRATORY:  Hypoxia     CARDIOVASCULAR:  Acidosis  Cardiomyopathy  Coronary artery disease  Hyperlipidemia  Hypertension, unspecified  Ventricular Tachycardia, Paroxysmal  Volume depletion  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     NUTRITION:  Malnutrition, moderate (albumin 2.4-3.0)     GASTROINTESTINAL:  Acute hepatic failure     FLUIDS / ELECTROLYTES:  Dehydration     RENAL:  AKI   CKD      HEMATOLOGIC:   No acute issues     ENDOCRINE:   No acute issues     INFECTIOUS DISEASE:   Bacterial pneumonia      NEUROLOGIC:  Altered Mental Status      PSYCHIATRIC, PAIN, SEDATION:  No acute issues     INJURY / DISEASE SPECIFIC NEEDS:  No acute issues   GI Prophylaxis: Yes  DVT Prophylaxis: Yes    ACTIVE LINES;   Patient Lines/Drains/Airways Status    Active Epidural Line / PICC Line / PIV Line / ART Line / Line / CVC Line     Name:   Placement date:   Placement time:   Site:   Days:    Peripheral IV 05/08/14 Right Lower leg  05/08/14   1542   Lower leg   1    Arterial Line 05/08/14 Left Femoral  05/08/14   2130   Femoral   less than 1    CVC Triple Lumen 05/08/14 Left Femoral  05/08/14   2130   Femoral   less than 1              ACTIVE  DRAINS:  Foley    DISPOSITION:   Remain in ICU    Total critical care time spent caring for this patient over the past 24 hours: 45 minutes    Anastasiya Gowin S HEBBELER-CLARK  05/09/2014  4:33 PM

## 2014-05-09 NOTE — Unmapped (Signed)
Clinical Pharmacy Note:?? Vancomycin Consult  63yo F HFrEF EF 15-20% s/p ICD, CAD s/p DES, CVA, HTN, HLD, CKD stage 3 transferred from [facility] 05/03/14 w/ concerns for fulminant hepatic failure, found to actually be in cardiogenic shock with Biventricular heart failure and possibly septic shock. Pharmacy has been consulted for assistance with vancomycin dosing.??     Current Therapy:  Piperacillin/tazobactam 3.375 grams Q12hrs  Vancomycin pulse dose (last dose given @ 23:45 on 05/08/14)    Pertinent Labs:  WBC 8.7  SCr 4.42    Assessment/Recommendations:  -With patient's renal dysfunction and dose given last night random level ordered today to assess need for further dosing  -Random level 43.2 mg/L (drawn at 15:16 today)  -Based on administration of antibiotics this admission, today is day 6 of broad antibiotic therapy  -Per primary team, plan is for total of 7 days of broad therapy  -No additional doses of vancomycin or vancomycin doses necessary at this time.    Pharmacy will sign off at this time.?? Thanks for the opportunity to participate in Gabrielle Rivera's care.?? Please call with questions.  Gabrielle Mester E Ashleen Demma, Pharm.D., BCPS  Pager [phone]  On call pager [phone] (nights/weekends)  4:07 PM  05/09/2014

## 2014-05-09 NOTE — Unmapped (Signed)
Problem: Compromised Skin Integrity  Use this problem when pressure ulcers are classified as stage I or II.   Goal: Skin integrity is maintained or improved  Assess and monitor skin integrity. Identify patients at risk for skin breakdown on admission and per policy. Collaborate with interdisciplinary team and initiate plans and interventions as needed.   Intervention: Turn patient  Pt's braden score is less than 16. She is on a low air loss bed and is being turned every 2 hours to monitor for any skin breakdown.

## 2014-05-09 NOTE — Unmapped (Signed)
Nursing-  Pt continues to be very anxious. She is grabbing at lines and tubes and trying to get out of bed. Pt is alert to person only and will follow only very simple commands. Pt given 1X dose of Ativan- see MAR. Will continue to monitor.    Sharia Reeve RN

## 2014-05-09 NOTE — Unmapped (Signed)
Renal Consult Follow Up Note      Chief complaint on admission : concern for hepatic failure/AKI    Reason for Renal Consult : AKI       HPI:  Gabrielle Rivera is a 64 y.o. 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 As transfer from [facility] for continued evaluation of AMS/renal failure/liver dysfunction?? for whom we are consulted for AKI.    Patient recently diagnosed with uterine fibroid and was taking Tylenol for pain although levels were not largely elevated on admission to [facility].    Patient presented 05/02/14 to [facility] with N/V/abd pain noted to have elevated INR 1.5 and elevated T bili to 1.10. BUN/Cr on admission was 59/2.78 with baseline ~2.0.?? U/S Abd showed diffuse gallbladder thickening which couple imply cholecystitis without stones found.?? 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 Patient was taking Lasix 40 mg BID / Lisinopril 2.5 mg which were held on admission due to AKI; light hydration NS @ 50 ml/hr per chart.    Patient became altered and started having hallucinations and was sedated/intubated for imaging of head. CT head without acute findings.    Contrast exposure:?? None  Nephrotoxic drug exposure: None  Hypotensive episodes: None    Past medical, family, and social histories were reviewed as previously documented. Updates were made as necessary.    Interval History:   UO 475cc: worsening in Renal Cr  NSTEMI - elevation troponin 2.6    HISTORIES.    PFSH unchanged    ROS.  Confused cannot be obtained      PHYSICAL EXAM    Temp:  [98 ??F (36.7 ??C)-98.9 ??F (37.2 ??C)] 98.2 ??F (36.8 ??C)  Heart Rate:  [101-131] 122  Resp:  [21-33] 21  BP: (92-121)/(57-92) 116/70 mmHg  Arterial Line BP: (94-130)/(49-77) 130/77 mmHg    Wt Readings from Last 3 Encounters:   05/09/14 117 lb 4.6 oz (53.2 kg)         Intake/Output Summary  (Last 24 hours) at 05/09/14 1203  Last data filed at 05/09/14 1022   Gross per 24 hour   Intake  608.3 ml   Output   1195 ml   Net -586.7 ml       Constitutional :AMS  Skin :   No rash, lesions, warm to touch  HENT:  Normocephalic, external ears and nose normal  Eyes :   no injection, icterus, EOM grossly intact.  Neck :   supple, No tracheal shift, no Thy mass  Chest :  No respiratory distress, On ventilator.     clear to auscultation, BS vesicular +0  CVS :   HR regular, HS S1 S2 + 0    LE edema neg  ABD :   non-distended, soft, non-tender,   Psych :  AMS      No Known Allergies    MEDS     Scheduled Meds:  ??? aspirin  81 mg Oral Daily with breakfast   ??? atorvastatin  10 mg Oral Nightly (2100)   ??? famotidine  20 mg Oral Daily 0900   ??? flu vaccine (seasonal < 65 yr)(PF) IM 0.5 mL  0.5 mL Intramuscular During Hospitalization   ??? heparin (porcine)  5,000 Units Subcutaneous 3 times per day   ??? metoprolol tartrate  12.5 mg Oral BID   ???  piperacillin-tazobactam (ZOSYN) IV extended interval  3.375 g Intravenous Q12H   ??? senna-docusate  1 tablet Oral BID     Continuous Infusions:     PRN Meds:.ondansetron, vancomycin intermittent/pulse dosing      DATA :       Date 05/08/14 0700 - 05/09/14 0659 05/09/14 0700 - 05/10/14 0659   Shift 0700-1459 1500-2259 2300-0659 24 Hour Total 0700-1459 1500-2259 2300-0659 24 Hour Total   I  N  T  A  K  E   I.V.  (mL/kg)  212  (3.7) 355  (6.7) 567  (10.7) 41.3  (0.8)   41.3  (0.8)      I.V.  212 355 567          Volume (mL) Furosemide     41.3   41.3    Shift Total  (mL/kg)  212  (3.7) 355  (6.7) 567  (10.7) 41.3  (0.8)   41.3  (0.8)   O  U  T  P  U  T   Urine  (mL/kg/hr)  355  (0.8) 500  (1.2) 855  (0.7) 340   340      Output (mL) (Urethral Catheter Latex;Double-lumen)  355 500 855 340   340    Emesis/NG output              Emesis Occurrence  0 x 0 x 0 x        Stool              Stool Occurrence  1 x 2 x 3 x 1 x   1 x    Shift Total  (mL/kg)  355  (6.1) 500  (9.4) 855  (16.1) 340  (6.4)    340  (6.4)   Weight (kg) 57.8 57.8 53.2 53.2 53.2 53.2 53.2 53.2       Lab Results   Component Value Date    CREATININE 4.42* 05/09/2014    BUN 85* 05/09/2014    NA 149* 05/09/2014    K 4.2 05/09/2014    CL 114* 05/09/2014    CO2 23 05/09/2014     Lab Results   Component Value Date    CALCIUM 9.1 05/09/2014    PHOS 4.7 05/09/2014     No results found for: [facility]  Lab Results   Component Value Date    WBC 8.7 05/09/2014    HGB 8.6* 05/09/2014    HCT 28.4* 05/09/2014    MCV 81.0 05/09/2014    PLT 184 05/09/2014     Lab Results   Component Value Date    IRON 17* 05/04/2014    TIBC 377 05/04/2014    FERRITIN 42.4 05/04/2014     Lab Results   Component Value Date    KETONESU Negative 05/08/2014    BLOODU Large* 05/08/2014    BILIRUBINUR Negative 05/08/2014    UROBILINOGEN <2.0 05/08/2014    PROTEINUA 100* 05/08/2014    NITRITE Negative 05/08/2014    LEUKOCYTESUR Moderate* 05/08/2014    PHUR 6.0 05/08/2014    LABSPEC 1.017 05/08/2014    CLARITYU Cloudy* 05/08/2014    COLORU Amber* 05/08/2014           In addition to the above, I have reviewed an extensive amount of complex data.    Morbidity / complication risk is felt to be :  high.      Assessment/Plan:   Gabrielle Rivera is a 64 y.o. female with a past medical history of Hep  C, CHFrEF 20% s/p BiVICD, Severe MR, Paroxysmal AFib, HTN, CVA 2014 who presented 05/03/2014?? As transfer from [facility] for continued evaluation of AMS/renal failure/liver dysfunction?? for whom we are consulted for AKI.    #.) Anuric AKI on CKD  - Baseline SCr 2.5 likely 2/2 poor perfusion CHF/HTN; R kidney ~ 9 cm at OSH suggesting chronic disease  - Unclear proteinuria prior to AKI; no records in system  - Suspect AKI etiology pre-renal to ATN on ACE vs. CRS vs. HRS  - UNa at OSH 28; suggesting pre-renal picture but unlikely HRS especially given liver not acutely decompensated  - Insults include: Lisinopril 2.5 mg at home stopped at OSH. Depressed cardiac function.   - Renal function was initially  improving with Cr back to baseline and UO adequate without diuretics. Patient however had an NSTEMI with tachyarrhythmias and increased oxygen requirement. Transferred back to MICU. Cr has bumped since then,     Concern for acute hepatic failure:  Evaluated by hepatology. Patient likely has chronic liver disease due to Hep C. Current liver dysfunction is likely due to hepatic congestion secondary to poor cardiac function.     Plan:   - Likely plan to take her to cath lab today. Will continue to follow for likely RRT needs.     Thank you for the consult.      [facility]  05/09/2014    Discussed with Consult staff Dr Olga Millers

## 2014-05-09 NOTE — Unmapped (Signed)
Picc placement order acknowledged by picc team.

## 2014-05-09 NOTE — Unmapped (Signed)
Cardiology Consult  Progress Note      Reason for Consult     Gabrielle Rivera is a 64 y.o. female on hospital day 6.  The principal reason for today's follow up visit is NSTEMI, increasing troponins.       Interval History / Subjective     Continues to have runs of unsustained V tach. She continues to have altered mental status. She was extubated to room air but then had increased O2 requirement to 2L nasal cannula after transfer to the floor; she is currently on 3L high flow nasal cannula.  Pt continues to have worsening of renal fx. Today Cr:4.4. Pt still on lasix gtt with reduced urine output.       Review of Systems (Focused)     Unable to obtain due to AMS.       Medications     Scheduled Meds:  ??? aspirin  81 mg Oral Daily with breakfast   ??? atorvastatin  10 mg Oral Nightly (2100)   ??? famotidine  20 mg Oral Daily 0900   ??? flu vaccine (seasonal < 65 yr)(PF) IM 0.5 mL  0.5 mL Intramuscular During Hospitalization   ??? heparin (porcine)  5,000 Units Subcutaneous 3 times per day   ??? metoprolol tartrate  12.5 mg Oral BID   ??? piperacillin-tazobactam (ZOSYN) IV extended interval  3.375 g Intravenous Q12H   ??? senna-docusate  1 tablet Oral BID     Continuous Infusions:  ??? electrolyte-R (pH 7.4)       PRN Meds:  ondansetron, vancomycin intermittent/pulse dosing       Vital Signs     Temp:  [98 ??F (36.7 ??C)-98.9 ??F (37.2 ??C)] 98.3 ??F (36.8 ??C)  Heart Rate:  [101-131] 121  Resp:  [21-44] 21  BP: (92-121)/(57-92) 93/65 mmHg  Arterial Line BP: (94-130)/(49-77) 130/77 mmHg    Intake/Output Summary (Last 24 hours) at 05/09/14 0951  Last data filed at 05/09/14 0700   Gross per 24 hour   Intake    567 ml   Output    920 ml   Net   -353 ml         Physical Exam     General appearance: thin, sitting up in bed with restraints  Neck: 8-10 cm JVD present  Lungs: bibasilar crackles present; rales present over left upper lobe  Heart: tachycardic, holosystolic blowing murmur   Abdomen: normal bowel sounds present; soft,  non-distended  Extremities: no lower extremity edema noted  Pulses: radial pulses 2+ bilaterally   Skin: Warm and well perfused  Neuro: Opens eyes and makes noises but does not respond to questions or interact during exam/interview      Laboratory Data         Lab 05/09/14  0234 05/08/14  0957 05/07/14  2013 05/07/14  0323   WBC 8.7 10.3 8.1 7.1   HEMOGLOBIN, POC 8.6* 10.2* 8.5* 9.4*   HEMATOCRIT BLOOD GAS 28.4* 33.2* 27.7* 30.4*   MCV, POC 81.0 81.1 80.7 80.6   PLATELETS, POC 184 176 159 161           Lab 05/09/14  0234 05/08/14  1408 05/08/14  0957 05/07/14  2013   SODIUM 149* 148* 146 144   POTASSIUM 4.2 4.0 4.7 4.3   CHLORIDE 114* 113* 111* 112*   CARBON DIOXIDE (CO2) 23 20* 19* 23   BUN 85* 71* 71* 62*   CREATININE 4.42* 4.04* 3.96* 3.10*   GLUCOSE 120* 175* 146* 186*  Lab 05/09/14  0234 05/08/14  1408 05/08/14  0957 05/07/14  2013 05/07/14  1936   CALCIUM 9.1 8.7 9.6 8.8  --    MAGNESIUM 2.8* 2.6* 2.9*  --  2.6*   PHOSPHORUS 4.7 3.5 3.8 3.6  --            Lab 05/08/14  1408 05/07/14  2013 05/06/14  0526 05/05/14  0441   INR POC 1.4* 1.4* 1.4* 1.6*   PROTHROMBIN TIME 16.9* 16.3* 16.4* 18.6*           Lab 05/08/14  1408 05/08/14  0957 05/07/14  2013 05/06/14  0204  05/03/14  2359   ALT 68*  --   --   --   --  47   AST 95*  --   --   --   --  69*   ALK PHOS 73  --   --   --   --  73   BILIRUBIN, TOTAL 7.8*  --   --   --   --  4.4*   BILIRUBIN, DIRECT 5.08*  --   --   --   --  3.10*   ALBUMIN 3.1* 3.5 3.0* 2.6*  < > 3.1*   3.1*   < > = values in this interval not displayed.        Lab 05/08/14  1315 05/04/14  1729 05/04/14  0327   COLOR, URINE, POC Amber* Yellow Yellow   CLARITY, URINE, POC Cloudy* Clear Slightly-Cloudy*   SPECIFIC GRAVITY, URINE 1.017 1.008 1.009   PH UA 6.0 6.0 6.0   PROTEIN UA, POC 100* Negative Negative   GLUCOSE UA, POC Negative Negative Negative   KETONES UA, POC Negative Negative Negative   BILIRUBIN UA, POC Negative Negative Negative   BLOOD UA, POC Large* Moderate* Large*    NITRITE UA Negative Negative Negative   UROBILINOGEN UA, POC <2.0 <2.0 <2.0   LEUKOCYTES UA, POC Moderate* Trace* Negative   RBC UA 42* 6* 7*   WBC UA 39* 4 4   BACTERIA Many* Rare* Rare*   HYALINE CASTS 4*  --   --    MUCUS UA, POC Present*  --   --            Lab 05/08/14  1408 05/08/14  0957 05/05/14  1139 05/04/14  2336   CK TOTAL  --  205  --   --    TROPONIN I 2.37* 2.59* 1.12* 1.04*       Lab Results   Component Value Date    NTPROBNP 29541* 05/03/2014       Lab Results   Component Value Date    TSH 0.40 05/07/2014    FREET4 0.87 05/07/2014           Lab 05/07/14  1238 05/05/14  0958 05/04/14  2036 05/04/14  1553   GLU MONITORING DEVICE, POC 134* 106* 193* 199*           Diagnostic Studies     Imaging 05/08/2014    CT CHEST  1. Left upper lobe airspace disease suspicious for pneumonia. There is a tiny left pleural effusion.  2. The dependent airspace disease may represent additional pneumonia or atelectasis.  3. The significance and etiology of the right hilar lymph node enlargement is unclear, possibly reactive.  4. Marked cardiomegaly . The enlarged main pulmonary artery suggest pulmonary hypertension.    CT ABDOMEN/PELVIS  1. Limited study with no source of sepsis.  2.  The enlarged uterus is nonspecific but likely due to fibroids.  3. The left kidney is mildly atrophic with a small hyperdense lesion, nonspecific.    CT HEAD W/O CONTRAST  Remote left middle cerebral artery infarct.  No acute hemorrhages or mass effect.    XRAY PORTABLE CHEST  Dual-lead cardiac pacing device appears unchanged. No new support devices are identified. The heart size is enlarged. Parenchymal opacities appear unchanged from the comparison exam from it hours prior. There is no pneumothorax or significant pleural effusion. - Unchanged appearance of the chest    Assessment & Plan     Gabrielle Rivera is a 64 y.o. female on HD# 6 with troponinemia, biventricular heart failure Acute respiratory failure with hypoxia and hypercarbia.  The  medical issues being addressed in today's encounter are as follows:    Principal Problem:    Acute respiratory failure with hypoxia and hypercarbia  Active Problems:    Hepatitis C    AKI (acute kidney injury)    1. Troponinemia  - Trending down this AM to 2.37 from 2.59. 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 Elevated troponin is likely due to demand ischemia.    - There is no need for catheterization at this time. If she becomes hemodynamically unstable,    - Continue ASA, atorvastatin, BB    2. Biventricular heart failure  - Former Designer, fashion/clothing numbers indicated that the patient had adequate to high cardiac output. She currently appears to be volume down as indicated by her weight (down 15 pounds since admission), decreasing urine output, and hypernatremia.    - Recommend avoiding diuretics and administering gentle fluid replacement   - Given her mitral regurgitation contributing to her heart failure she would benefit from afterload reduction - recommend starting hydralazine 10 mg TID and uptitrate each dose as tolerated   isosorbide mononitrate 10 mg PO TID and uptitrate as tolerated   - Would uptitreate her metoprolol 12.5mg  BID to 25 mg PO BID as she has multiple runs of SVTs   - Keep K>4 and Mg>2   - No ACIE or ARBs due to AKI, Start ACE inhibitor prior to discharge               - Daily weight and I/O              - Renal panel Q12    3. Arrhythmia  - NSVT; Could consider starting amiodarone if episodes continue      Derrill Memo, MS  Lonna Duval, MD  Cardiology fellow

## 2014-05-09 NOTE — Unmapped (Signed)
Pt with high Cr.  Not candidate for bedside Picc.  Please refer to IR for access options.

## 2014-05-09 NOTE — Unmapped (Signed)
vigileo set up to monitor patients hemodynamic status

## 2014-05-09 NOTE — Unmapped (Signed)
Nursing-  Pt is very anxious- attempting to get out of bed. Pt is not redirectable. Pt placed in a posey restraint and remains in soft 4 point restraints. Pt given 1x dose of Ativan- see MAR. Will continue to monitor.    Sharia Reeve RN

## 2014-05-09 NOTE — Unmapped (Signed)
Problem: Non-violent restraint safety  Goal: Patient will be free from injury during restraint period  Assess and monitor for unsafe behaviors that can include unintentional harm of self or others, risk for joint dislocation related to post-operative status, risks for potential nutrition/hydration issues related to removing/tampering with medical equipment, protection of medical procedures, or protection of medical device access.   Intervention: Assess patient findings every two hours and as needed for nutrition/hydration, physical/psychological status, circulation, injury, ROM, skin care, toileting, intake, hygiene.  Pt remains restrained to prevent her from pulling at her lines and tubes. Unable to follow commands at this time. Will continue to monitor pt and assess for any incontinence. She is turned every 2 hours to prevent any skin breakdown. Currently she is on a low air loss bed.

## 2014-05-09 NOTE — Unmapped (Signed)
[facility]  MICU PROGRESS NOTE    Patient: Gabrielle Rivera    Length of Stay: 6 days     Reason for admission:  Came from [facility] (pt there for 2 days) (initial presentation with ams, n/v) - sent to [facility] for evaluation for liver failure and work up. Intubated at OSH for ams and airway protection.   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 1x fever on day of extubation. S/p atbx and d/c'ed antibiotics 05/08/14.  Transferred to floor 05/02/14 but then patient had pvcs with st depressions and brought back to micu. AMS in the micu but protecting airway.     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     BRIEF DAILY PLAN: see detailed A&P below  - D/C lasix gtt  - 250 normosol bolus  - goals of care conversation with son regarding poor prognosis  - atbx changes to avoid nephrotoxic medications, treat total of 7 days from start of the vanc/zosyn, with end date in place    ---------------------------------------------------------------------    ASSESSMENT and PLAN:  General: 63yo F HFrEF EF 15-20% s/p ICD, CAD s/p DES, CVA, HTN, HLD, CKD stage 3 transferred from [facility] 05/03/14 w/ concerns for fulminant hepatic failure, found to actually be in cardiogenic shock with Biventricular heart failure and possibly septic shock    Airway:  Nasal cannula, protecting airway    Neuro/Psych:  # AMS With  agitation and disorientation  - intubated 05/03/14 due to concern for inability to protect airway, extubated 05/06/14.   - CT head with old stroke, without changes, no signs of bleeding  - DDx: delirium, uremia, sepsis, medications  - b12/folate unremarkable, thyroid studies unremarkable  - ammonia 5 05/03/14,  lactate 1.0 (05/09/14)  - uric acid 12.2 on 05/04/14  - BUN = 85 (05/09/14), with hx of CKD  (see renal)   BUN down to 47 on 05/07/14  - for possible source of infection see ID  - no LP done, INR stable ~1.4 on 05/09/14    Pulmonary:  # Hypoxia  - 3L nasal cannula with stable sats  - CHEST ct 05/08/14 WITH lul Disease suspicious for pneumonia  - continue supplemental oxygen  - concern for pna (see ID)    Cardiovascular:  #Shock cardiogenic and septic shock  - no longer on pressors  - stable BPs, lactate trended down    #NSTEMI  - troponins 0.74 (05/03/14) --> 2.59 (05/08/14 at 0957) --> 2.37 (2/7 at 1408)  - cardiology on board  - recommended heparin gtt in setting of NSTEMI, but suggested more likely demand ischemia from high output cardiac state, hold off on heparin gtt for now  - high creatinine, so hold off on LHC  - likely demand, trend trop until trend down x2, repeat EKG 05/09/14 AM without ST  depression  - LHC 2013 at [facility] without angiographic difinable stenosis, EF 25% on LHC    # Arrythmia  - EKG 05/08/14 with signs of WPW  - cardiology recommended considering amiodarone   - in setting of resolution of wide rhythm arrythmia, hold off on amiodarone    Hx of Biventricular systolic HF TTE 05/04/14  15-20% EF s/p ICD  - NT proBNP 29K on 05/03/14  - lasix gtt on board at 10/hr --> D/C'ed lasix gtt 05/09/14 given patient hypovolemic on physical exam  - vigileo to be put on 05/09/14  - consider hydralazine/nitroglycerine when hemodynamically stable with lasix gtt off for afterload reduction    # severe mitral regurgitation TTE 05/04/14  - EF 15-20%   - not candidate for surgery despite severe regurgitation    # right  ventricular dysfunction  - TAPSE 1.2 TTE 05/04/14 with signs of pulsus alternans in RVOT suggesting RV dysfunction  - likely due to LV dysfunction and pulm HTN    # 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 25 on admission --> improved when SWAN in place    Gastrointestinal:  # Transaminitis  - AST/ALT 05/09/14 = 95/68  - likely congestive hepatopathy  - no gross abnormality noted in ct abd 05/08/14  - INR 1.4 (05/09/14) with platelets wnl  - abd ultrasound 05/05/14 with normal flow in vasculature and signs of congestive hepatopathy and venous reflux    # Direct Bilirubinemia  - consider etiology as congestive hepatopathy    Renal/GU:  # AKI on CKD   Creatinine on admission 4.23 (05/03/14) --> 2.41 (05/06/14) --> 2.1 (05/07/14) --> then climbed back up to 3.1 (05/07/14) --> 4.42 (05/09/14)  - consider cardiorenal at  [facility] and then improved then over diuresis  - urine studies pending 05/09/14  - left kidney with disease per ct abd without contrast 05/08/14    Acid/Base:  PH 7.37 on 05/09/14 VBG with pco2 = 43, bicarb on renal = 23  - no acute issues  - on lasix gtt    Infectious Disease:   Febrile 101.13F on 05/07/14 x1  WBC wnl     # Concern for sepsis   - CT 05/08/14 with LUL infiltrate concerning for pneumonia  - vanc and zosyn restarted 05/08/14, previously on vanc/cefepime (2/3 - 2/6)  - pericardial fluid on TTE 05/04/14  - UA 05/08/14 via foley: many bacteria with WBC 39, moderate leukoesterate, negative nitrites, urine culture pending  - UCx 05/04/14 NGTD, BCx 05/04/14 NGTD x2, line draw 05/04/14 with NGTD    Endocrine:   No acute issues  Glucose stable    Heme/Onc:   # Normocytic anemia  - hgb baseline ~9  - hgb 05/09/14 = 8.6 and no signs of bleeding    Fluid/Electrolytes/Nutrition -  Intravascular volume assessment: hypovolemic   Weight: 132 (05/03/14) --> 117 (05/09/14)   IVF: none continuous  Electrolytes:   # Hypernatremia  - free water boluses increased    Nutrition:   Diet Orders           FEEDING TUBE WATER BOLUS starting at 02/08 1600    Diet dysphagia 1 Nectar Thick starting at 02/06 1110    Continuous tube feedings starting at 02/04 1156        Prophylaxis:  DVT Prophylaxis: Subcutaneous Heparin  GI Prophylaxis: Pepcid    Reanimation:   PT/OT when stable  Social work:   social work consulted    -----------------------------------------------------  Code Status: Full Code    NURSING:   Lines and tubes:   Left fem triple lumen cath   Left fem a line   Foley: Yes    Drips       In/Out:    Intake/Output Summary (Last 24 hours) at 05/09/14 1609  Last data filed at 05/09/14 1308   Gross per 24 hour   Intake  608.3 ml   Output   1465 ml   Net -856.7 ml       I/O last 3 completed shifts:  In: 1091 [I.V.:1091]  Out: 1220 [Urine:1220]      RESPIRATORY  Ventilator Settings:       Last blood gas      Lab 05/08/14  2037 05/06/14  0918 05/06/14  0204 05/05/14  1806 05/05/14  1349   PH ARTERIAL 7.48* 7.39 7.42 7.44 7.44   PCO2CORD 27* 46* 44 42 38   PO2 ARTERIAL 69* 101* 85 129* 150*   HCO3 ARTERIAL 20* 28* 28* 29* 25   BASE EXCESS ARTERIAL -3.4* 2.6 3.6* 4.1* 1.0         PHYSICAL EXAM:   Vital signs:   Temp:  [97.5 ??F (36.4 ??C)-98.9 ??F (37.2 ??C)] 97.5 ??F (36.4 ??C)  Heart Rate:  [101-131] 122  Resp:  [21-33] 21  BP: (93-121)/(61-79) 116/70 mmHg  Arterial Line BP: (94-130)/(49-77) 130/77 mmHg    BP 116/70 mmHg   Pulse 122   Temp(Src) 97.5 ??F (36.4 ??C) (Oral)   Resp 21   Ht 5' 4 (1.626 m)   Wt 117 lb 4.6 oz (53.2 kg)   BMI 20.12 kg/m2   SpO2 100%    General: nasal cannula, not consistently opening eyes to verbal stimuli, not following commands consistently  HEENT: atraumatic, PERRL, no icterus, no jvd  Neck: no thyromegaly, no lympadenopathy, neck supple and symmetrical  Cardiac: RRR, no murmurs, no rubs, no gallops  Pulm: aerating well - with good air sounds, no crackles, no wheezes, no rhonchi  Abd: normal bowel sounds, soft, non tender to palpation, no organomegaly or masses  Extremities: atraumatic, no  cyanosis, no LE edema, left fem triple lumen and left fem a line without any signs of infection  Skin: atraumatic, no rashes or lesions  Neuro: moving all extremities without difficulty, grossly intact   Foley in place      Nutrition  Diet Orders          FEEDING TUBE WATER BOLUS starting at 02/08 1600    Diet dysphagia 1 Nectar Thick starting at 02/06 1110    Continuous tube feedings starting at 02/04 1156          LABS      Lab 05/09/14  0234 05/08/14  0957 05/07/14  2013 05/07/14  0323 05/06/14  0204   WBC 8.7 10.3 8.1 7.1 6.3   HEMOGLOBIN, POC 8.6* 10.2* 8.5* 9.4* 9.1*   HEMATOCRIT BLOOD GAS 28.4* 33.2* 27.7* 30.4* 29.3*   PLATELETS, POC 184 176 159 161 160   MCV, POC 81.0 81.1 80.7 80.6 80.3           Lab 05/09/14  0234 05/08/14  1408 05/08/14  0957 05/07/14  2013 05/07/14  1936 05/07/14  1245   SODIUM 149* 148* 146 144  --  148*   POTASSIUM 4.2 4.0 4.7 4.3  --  4.3   CHLORIDE 114* 113* 111* 112*  --  110  CARBON DIOXIDE (CO2) 23 20* 19* 23  --  21   BUN 85* 71* 71* 62*  --  58*   CREATININE 4.42* 4.04* 3.96* 3.10*  --  2.89*   GLUCOSE 120* 175* 146* 186*  --  143*   CALCIUM 9.1 8.7 9.6 8.8  --  9.3   MAGNESIUM 2.8* 2.6* 2.9*  --  2.6* 2.5   PHOSPHORUS 4.7 3.5 3.8 3.6  --  4.0                 Lab 05/09/14  0234 05/08/14  1412 05/06/14  0204 05/03/14  2359   POC LACTATE 1.0 2.9* 0.4* 1.0           Lab 05/08/14  1408 05/08/14  0957 05/07/14  2013 05/06/14  0204 05/04/14  1240  05/03/14  2359   ALK PHOS 73  --   --   --   --   --  73   AST 95*  --   --   --   --   --  69*   ALT 68*  --   --   --   --   --  47   BILIRUBIN, TOTAL 7.8*  --   --   --   --   --  4.4*   BILIRUBIN, DIRECT 5.08*  --   --   --   --   --  3.10*   TOTAL PROTEIN, POC 7.2  --   --   --   --   --  7.1   ALBUMIN 3.1* 3.5 3.0* 2.6* 3.1*  < > 3.1*   3.1*   < > = values in this interval not displayed.        Lab 05/08/14  1408 05/07/14  2013 05/06/14  0526 05/05/14  0441 05/03/14  2359   INR POC 1.4* 1.4* 1.4* 1.6* 1.8*   PROTHROMBIN TIME 16.9*  16.3* 16.4* 18.6* 20.1*             ------      ------      ------      ------      ------      ------      ------      ------      ------      ------      ------      ------    Aforementioned assessment and plan relayed to the team.   Information will be relayed to family and patient.  Thank you for taking the time to read this note  [facility]  [facility]

## 2014-05-09 NOTE — Unmapped (Signed)
Problem: Non-violent restraint safety  Goal: Patient will be free from injury during restraint period  Assess and monitor for unsafe behaviors that can include unintentional harm of self or others, risk for joint dislocation related to post-operative status, risks for potential nutrition/hydration issues related to removing/tampering with medical equipment, protection of medical procedures, or protection of medical device access.   Outcome: Progressing  Nursing-  Pt in BSW restraints. Pt is alert to person and very anxious with care. Pt is CAM (+), but follows very simple commands. Pt is on Lasix gtt. Pt has multiple lines and tubes (nasal cannula, Aline, TLC, PIV and foley catheter). Pt needs the use of BSW restraints for the protection of access and medical devices and to directly promote healing and care. Will continue to monitor for need.    Sharia Reeve RN

## 2014-05-10 LAB — CBC
Hematocrit: 29.4 % (ref 35.0–45.0)
Hemoglobin: 9.1 g/dL (ref 11.7–15.5)
MCH: 24.5 pg (ref 27.0–33.0)
MCHC: 30.9 g/dL (ref 32.0–36.0)
MCV: 79.1 fL (ref 80.0–100.0)
MPV: 9 fL (ref 7.5–11.5)
Platelets: 210 10*3/uL (ref 140–400)
RBC: 3.71 10*6/uL (ref 3.80–5.10)
RDW: 19 % (ref 11.0–15.0)
WBC: 9.7 10*3/uL (ref 3.8–10.8)

## 2014-05-10 LAB — FUNGUS CULTURE, BLOOD

## 2014-05-10 LAB — RENAL FUNCTION PANEL W/EGFR
Albumin: 3.2 g/dL — ABNORMAL LOW (ref 3.5–5.7)
Albumin: 2.9 g/dL (ref 3.5–5.7)
Anion Gap: 16 mmol/L (ref 3–16)
Anion Gap: 14 mmol/L (ref 3–16)
BUN: 97 mg/dL — ABNORMAL HIGH (ref 7–25)
BUN: 100 mg/dL (ref 7–25)
CO2: 21 mmol/L (ref 21–33)
CO2: 23 mmol/L (ref 21–33)
Calcium: 9.4 mg/dL (ref 8.6–10.3)
Calcium: 8.6 mg/dL (ref 8.6–10.3)
Chloride: 113 mmol/L — ABNORMAL HIGH (ref 98–110)
Chloride: 114 mmol/L (ref 98–110)
Creatinine: 5.08 mg/dL — ABNORMAL HIGH (ref 0.60–1.30)
Creatinine: 5.43 mg/dL (ref 0.60–1.30)
GFR MDRD Af Amer: 10 See note.
GFR MDRD Af Amer: 10 See note.
GFR MDRD Non Af Amer: 9 See note.
GFR MDRD Non Af Amer: 8 See note.
Glucose: 106 mg/dL — ABNORMAL HIGH (ref 70–100)
Glucose: 116 mg/dL (ref 70–100)
Osmolality, Calculated: 341 mosm/kg — ABNORMAL HIGH (ref 278–305)
Osmolality, Calculated: 344 mOsm/kg (ref 278–305)
Phosphorus: 3.6 mg/dL (ref 2.1–4.7)
Phosphorus: 4.4 mg/dL (ref 2.1–4.7)
Potassium: 3.8 mmol/L (ref 3.5–5.3)
Potassium: 3.8 mmol/L (ref 3.5–5.3)
Sodium: 150 mmol/L — ABNORMAL HIGH (ref 133–146)
Sodium: 151 mmol/L (ref 133–146)

## 2014-05-10 LAB — DIFFERENTIAL
Basophils Absolute: 68 /uL (ref 0–200)
Basophils Relative: 0.7 % (ref 0.0–1.0)
Eosinophils Absolute: 87 /uL (ref 15–500)
Eosinophils Relative: 0.9 % (ref 0.0–8.0)
Lymphocytes Absolute: 863 /uL (ref 850–3900)
Lymphocytes Relative: 8.9 % (ref 15.0–45.0)
Monocytes Absolute: 1329 /uL (ref 200–950)
Monocytes Relative: 13.7 % (ref 0.0–12.0)
Neutrophils Absolute: 7353 /uL (ref 1500–7800)
Neutrophils Relative: 75.8 % (ref 40.0–80.0)

## 2014-05-10 LAB — HEPATIC FUNCTION PANEL
ALT: 76 U/L (ref 7–52)
AST: 97 U/L (ref 13–39)
Albumin: 2.9 g/dL (ref 3.5–5.7)
Alkaline Phosphatase: 64 U/L (ref 36–125)
Bilirubin, Direct: 6.23 mg/dL (ref 0.00–0.40)
Bilirubin, Indirect: 2.17 mg/dL (ref 0.00–1.10)
Total Bilirubin: 8.4 mg/dL (ref 0.0–1.5)
Total Protein: 6.4 g/dL (ref 6.4–8.9)

## 2014-05-10 LAB — MAGNESIUM: Magnesium: 2.8 mg/dL (ref 1.5–2.5)

## 2014-05-10 LAB — VENOUS BLOOD GAS, LINE/SYRINGE
%HBO2-Line Draw: 42 % (ref 40.0–70.0)
Base Excess-Line Draw: 1.9 mmol/L (ref <=3.0)
CO2 Content-Line Draw: 28 mmol/L (ref 25–29)
Carboxyhgb-Line Draw: 2.2 %
HCO3-Line Draw: 27 mmol/L (ref 24–28)
Methemoglobin-Line Draw: 1.5 % (ref 0.0–1.5)
PCO2-Line Draw: 43 mm Hg (ref 41–51)
PH-Line Draw: 7.4 (ref 7.32–7.42)
PO2-Line Draw: 30 mm Hg (ref 25–40)
Reduced Hemoglobin-Line Draw: 54.3 % (ref 0.0–5.0)

## 2014-05-10 LAB — TROPONIN I
Troponin I: 3.03 ng/mL (ref 0.00–0.03)
Troponin I: 3.16 ng/mL — CR (ref 0.00–0.03)
Troponin I: 2.63 ng/mL — CR (ref 0.00–0.03)
Troponin I: 2.65 ng/mL — CR (ref 0.00–0.03)

## 2014-05-10 LAB — BLOOD GAS, ARTERIAL
%HBO2, Arterial: 95.8 % (ref 95.0–98.0)
Base Excess, Arterial: -1.7 mmol/L (ref <=3.0)
CO2 Content,Arterial: 24 mmol/L (ref 23–27)
Carboxyhemoglobin, Arterial: 2.3 %
HCO3, Arterial: 23 mmol/L (ref 22–26)
Methemoglobin, Arterial: 0 % (ref 0.0–1.5)
Reduced hemoglobin, Arterial: 2.7 % (ref 0.0–5.0)
pCO2, Arterial: 38 mm Hg (ref 35–45)
pH, Arterial: 7.4 (ref 7.35–7.45)
pO2, Arterial: 95 mm Hg (ref 80–100)

## 2014-05-10 LAB — NTPROBNP: NT Pro BNP: 118422 pg/mL (ref 15–125)

## 2014-05-10 MED ORDER — LORazepam (ATIVAN) 2 mg/mL injection
2 | Freq: Once | INTRAMUSCULAR | Status: AC | PRN
Start: 2014-05-10 — End: 2014-05-10
  Administered 2014-05-10: 1 mg via INTRAVENOUS

## 2014-05-10 MED ORDER — LORazepam (ATIVAN) 2 mg/mL injection
2 | INTRAMUSCULAR | Status: AC
Start: 2014-05-10 — End: 2014-05-10
  Administered 2014-05-10: 06:00:00 1 via INTRAVENOUS

## 2014-05-10 MED ORDER — 0.45% NaCl infusion
0.45 | Freq: Once | INTRAVENOUS | Status: AC
Start: 2014-05-10 — End: 2014-05-10
  Administered 2014-05-10: 10:00:00 500 mL/h via INTRAVENOUS

## 2014-05-10 MED ORDER — haloperidol lactate (HALDOL) 5 mg/mL injection
5 | INTRAMUSCULAR | Status: AC
Start: 2014-05-10 — End: 2014-05-10
  Administered 2014-05-11: 03:00:00 5 via INTRAVENOUS

## 2014-05-10 MED ORDER — haloperidol lactate (HALDOL) injection 5 mg
5 | Freq: Four times a day (QID) | INTRAMUSCULAR | Status: AC | PRN
Start: 2014-05-10 — End: 2014-05-11
  Administered 2014-05-11: 10:00:00 5 mg via INTRAVENOUS

## 2014-05-10 MED ORDER — LORazepam (ATIVAN) 2 mg/mL injection
2 | Freq: Once | INTRAMUSCULAR | Status: AC
Start: 2014-05-10 — End: 2014-05-10

## 2014-05-10 MED ORDER — electrolyte-R (pH 7.4) (NORMOSOL-R pH 7.4) iv solution SolP
INTRAVENOUS | Status: AC
Start: 2014-05-10 — End: 2014-05-10
  Administered 2014-05-10: 18:00:00 125 mL/h via INTRAVENOUS

## 2014-05-10 MED ORDER — LORazepam (ATIVAN) 2 mg/mL injection
2 | INTRAMUSCULAR | Status: AC
Start: 2014-05-10 — End: 2014-05-11

## 2014-05-10 MED FILL — ASPIRIN 81 MG CHEWABLE TABLET: 81 81 MG | ORAL | Qty: 1

## 2014-05-10 MED FILL — ATORVASTATIN 10 MG TABLET: 10 10 MG | ORAL | Qty: 1

## 2014-05-10 MED FILL — HEPARIN (PORCINE) 5,000 UNIT/ML INJECTION SOLUTION: 5000 5,000 unit/mL | INTRAMUSCULAR | Qty: 1

## 2014-05-10 MED FILL — SENNA-S 8.6 MG-50 MG TABLET: 8.6-50 8.6-50 mg | ORAL | Qty: 1

## 2014-05-10 MED FILL — LORAZEPAM 2 MG/ML INJECTION SOLUTION: 2 2 mg/mL | INTRAMUSCULAR | Qty: 1

## 2014-05-10 MED FILL — FAMOTIDINE 20 MG TABLET: 20 20 MG | ORAL | Qty: 1

## 2014-05-10 MED FILL — METOPROLOL TARTRATE 25 MG TABLET: 25 25 MG | ORAL | Qty: 1

## 2014-05-10 MED FILL — HALOPERIDOL LACTATE 5 MG/ML INJECTION SOLUTION: 5 5 mg/mL | INTRAMUSCULAR | Qty: 1

## 2014-05-10 MED FILL — ZOSYN 3.375 GRAM/50 ML IN DEXTROSE (ISO-OSMOTIC) INTRAVENOUS PIGGYBACK: 3.375 3.375 gram/50 mL | INTRAVENOUS | Qty: 50

## 2014-05-10 NOTE — Unmapped (Signed)
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 Transferred to floor 05/02/14 but then patient had pvcs with st depressions and brought back to micu.   AMS in the micu but protecting airway with good gag reflex    Baseline mental status = normal, no altered mental status at baseline per patient's son    Significant events over the past 24 hours include:  - persistent AMS overnight  - many episodes of nonsustained VTach overnight  - EKG 05/09/14 with narrow complex rhythm  - rising BUN,  rising Na  - given fluid bolus of 1/2 normal saline    BRIEF DAILY PLAN: see detailed A&P below  - increased weight, high NTProBNP ~100k,  but some pulse variations on A line wave and dry on physical exam --> cardiology recommended more volume: 163ml/hr of normosol x4 hours  - completed total of 7 days from start of the vanc/zosyn  - if we pursue aggressive/full code management, patient will likely need SWAN for assessment of fluid status invasively and more accurately  - goals of care conversation with son regarding poor prognosis and worsening status    ---------------------------------------------------------------------    ASSESSMENT and PLAN:  General: 63yo F HFrEF EF 15-20% s/p ICD, CAD s/p DES, CVA, HTN, HLD, CKD stage 3 transferred from [facility] 05/03/14 w/ concerns for fulminant hepatic failure, found to actually be in cardiogenic shock with Biventricular heart failure and possibly septic shock    Airway:  Nasal cannula, protecting airway    Neuro/Psych:  # AMS With agitation and disorientation  - intubated 05/03/14 due to concern for inability to protect airway, extubated 05/06/14.   - CT head with  old stroke, without changes, no signs of bleeding  - DDx: delirium, uremia, sepsis, medications  - b12/folate unremarkable, thyroid studies unremarkable  - ammonia 5 05/03/14,  lactate 1.0 (05/09/14)  - uric acid 12.2 on 05/04/14  - consider uremia, BUN = 85 (05/09/14), with hx of CKD  (see renal) --> 97 on 05/10/14   BUN was down to 47 on 05/07/14  - for possible source of infection see ID, however, note: no LP done, INR stable ~1.4 on 05/09/14  - rising Na, see electrolytes below    Pulmonary:  # Hypoxia  - 3L nasal cannula with stable sats  - CHEST ct 05/08/14 WITH lul Disease suspicious for pneumonia- concern for pna (see ID)   - continue supplemental oxygen    Cardiovascular:  #Shock cardiogenic and septic shock  - no longer on pressors  - stable BPs, lactate trended down    #NSTEMI  - troponins 0.74 (05/03/14) --> 2.59 (05/08/14 at 0957) --> 2.37 (2/7 at 1408) --> 3.16 (05/10/14 at 0117) --> 2.63 (05/10/14 at 1610)  - cardiology on board  - likely demand, trend trop until trend down x2, repeat EKG 05/09/14 AM without ST depression   - no heparin started  - LHC 2013 at [facility] without angiographic difinable stenosis, EF 25% on LHC    # Arrythmia - persistent nonsustained VTach, ICD in place  - due to significant, advanced heart failure  - EKG  05/08/14 with signs of WPW  - cardiology recommended considering amiodarone   - in setting of resolution of wide rhythm arrythmia, hold off on amiodarone    Hx of Biventricular systolic HF TTE 05/04/14  15-20% EF s/p ICD  - NT proBNP 29K on 05/03/14 --> NT PRO bnp [id number] (05/10/14)  - lasix gtt on board at 10/hr --> D/C'ed lasix gtt 05/09/14 given patient hypovolemic on physical exam  - vigileo on 05/09/14  - if pressures low, will need positive inotropic agent  - consider hydralazine/nitroglycerine when hemodynamically stable with lasix gtt off for afterload reduction  - wt: 132 on admission --> 117 05/09/14 --> 126 lbs (05/10/14)    # Severe mitral regurgitation TTE 05/04/14  - EF 15-20%   - not  candidate for surgery despite severe regurgitation    # Right ventricular dysfunction  - TAPSE 1.2 TTE 05/04/14 with signs of pulsus alternans in RVOT suggesting RV dysfunction  - likely due to LV dysfunction and pulm HTN    # 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 25 on admission --> improved when SWAN in place    Gastrointestinal:  # Transaminitis  - AST/ALT 05/08/14 = 95/68, repeat LFTs 05/10/14 stable  - likely congestive hepatopathy  - no gross abnormality noted in ct abd 05/08/14  - INR 1.4 (05/09/14) with platelets wnl  - abd ultrasound 05/05/14 with normal flow in vasculature and signs of congestive hepatopathy and venous reflux    # Direct Bilirubinemia  - dbili 05/10/14 = 6.23  consider etiology as congestive hepatopathy    Renal/GU:  # Oliguric AKI on CKD (baseline creatinine 2.5)  - Creatinine on admission 4.23 (05/03/14) --> 2.41 (05/06/14) --> 2.1 (05/07/14) --> then climbed back up to 3.1 (05/07/14) --> 4.42 (05/09/14)--> 5.08 (05/10/14)  - consider [facility]orenal at  [facility] and then improved then over diuresis  - FENa ~8-9% but this was right after lasix gtt so unreliable, FEUrea pending  - left kidney with disease per ct abd without contrast 05/08/14  - decreased urine output overnight of 05/09/14  - need to have goals of care conversation before can consider HD     Acid/Base:  PH 7.37 on 05/09/14 VBG with pco2 = 43, bicarb on renal = 23  Ph 7.4 on 05/10/14 pCO2 38, bicarb 23, pao2 =95  - no acute issues  - was on lasix gtt, turned off 05/09/14    Infectious Disease:   Febrile 101.60F on 05/07/14 x1  WBC wnl   # Concern for sepsis   - CT 05/08/14 with LUL infiltrate concerning for pneumonia  - vanc and zosyn restarted 05/08/14, previously on vanc/cefepime (2/3 - 2/6), completed broad coverage antibiotic course x7 days  - pericardial fluid on TTE 05/04/14  - UA 05/08/14 via foley: many bacteria with WBC 39, moderate leukoesterate, negative nitrites  - UCx 05/04/14 NGTD, BCx 05/04/14 NGTD  x2, line draw 05/04/14 with NGTD    # Funguria  - yeast isolated in urine culture 05/08/14  -  blood fungal culture pending  - qtc 491 05/09/14    Endocrine:   No acute issues  Glucose stable, TSH 0.4 and wnl 05/07/14    Heme/Onc:   # Normocytic anemia  - hgb baseline ~9  - hgb 05/09/14 = 8.6 and no signs of bleeding    Fluid/Electrolytes/Nutrition -  Intravascular volume assessment: hypovolemic, but hard to assess accurately on  physical exam   Weight: 132 (05/03/14) --> 117 (05/09/14) --> 126 (05/10/14)   IVF: none continuous  Electrolytes:   # Hypernatremia  - free water boluses increased  Nutrition:   Diet Orders          FEEDING TUBE WATER BOLUS starting at 02/09 0800    Diet dysphagia 1 Nectar Thick starting at 02/06 1110    Continuous tube feedings starting at 02/04 1156        Prophylaxis:  DVT Prophylaxis: Subcutaneous Heparin  GI Prophylaxis: Pepcid    Reanimation:   PT/OT when stable     Social work:   social work consulted    -----------------------------------------------------  Code Status: Full Code    NURSING:   Lines and tubes:   Left fem triple lumen cath   Left fem a line   Foley: Yes    Drips  ??? electrolyte-R (pH 7.4)         In/Out:    Intake/Output Summary (Last 24 hours) at 05/10/14 1245  Last data filed at 05/10/14 1120   Gross per 24 hour   Intake   1885 ml   Output   1185 ml   Net    700 ml       I/O last 3 completed shifts:  In: 2281.3 [I.V.:2052.3; NG/GT:179; IV Piggyback:50]  Out: 2290 [Urine:2290]      RESPIRATORY  Ventilator Settings:  Vent Mode:  [-]   FiO2:  [3 %] 3 %    Last blood gas      Lab 05/10/14  0524 05/08/14  2037 05/06/14  0918 05/06/14  0204 05/05/14  1806   PH ARTERIAL 7.40 7.48* 7.39 7.42 7.44   PCO2CORD 38 27* 46* 44 42   PO2 ARTERIAL 95 69* 101* 85 129*   HCO3 ARTERIAL 23 20* 28* 28* 29*   BASE EXCESS ARTERIAL -1.7 -3.4* 2.6 3.6* 4.1*         PHYSICAL EXAM:   Vital signs:   Temp:  [97.5 ??F (36.4 ??C)-98.4 ??F (36.9 ??C)] 98.4 ??F (36.9 ??C)  Heart Rate:  [93-125] 106  Resp:  [23-42]  40  BP: (70-110)/(50-75) 91/50 mmHg  Arterial Line BP: (81-137)/(44-82) 99/57 mmHg  FiO2:  [3 %] 3 %    BP 91/50 mmHg   Pulse 106   Temp(Src) 98.4 ??F (36.9 ??C) (Oral)   Resp 40   Ht 5' 4 (1.626 m)   Wt 126 lb 8.7 oz (57.4 kg)   BMI 21.71 kg/m2   SpO2 99%    General: nasal cannula, not consistently opening eyes to verbal stimuli, not following commands consistently  HEENT: atraumatic, PERRL, mild icterus,  no jvd  Neck: no thyromegaly, no lympadenopathy, neck supple and symmetrical  Cardiac: RRR, no murmurs, no rubs, no gallops  Pulm: aerating well - with good air sounds, no crackles, no wheezes, no rhonchi  Abd: normal bowel sounds, soft, non tender to palpation, no organomegaly or masses  Extremities: atraumatic, no cyanosis, no LE edema, left fem triple lumen and left fem a line without any signs of infection  Skin: atraumatic, no rashes or lesions  Neuro: moving all extremities without difficulty, grossly intact   Foley in place      Nutrition  Diet Orders          FEEDING TUBE WATER BOLUS starting at 02/09 0800    Diet dysphagia 1 Nectar Thick starting at 02/06 1110    Continuous tube feedings starting at 02/04 1156  LABS      Lab 05/10/14  0117 05/09/14  0234 05/08/14  0957 05/07/14  2013 05/07/14  0323   WBC 9.7 8.7 10.3 8.1 7.1   HEMOGLOBIN, POC 9.1* 8.6* 10.2* 8.5* 9.4*   HEMATOCRIT BLOOD GAS 29.4* 28.4* 33.2* 27.7* 30.4*   PLATELETS, POC 210 184 176 159 161   MCV, POC 79.1* 81.0 81.1 80.7 80.6           Lab 05/10/14  0626 05/10/14  0117 05/09/14  0234 05/08/14  1408 05/08/14  0957  05/07/14  1936   SODIUM 151* 150* 149* 148* 146  < >  --    POTASSIUM 3.8 3.8 4.2 4.0 4.7  < >  --    CHLORIDE 114* 113* 114* 113* 111*  < >  --    CARBON DIOXIDE (CO2) 23 21 23  20* 19*  < >  --    BUN 100* 97* 85* 71* 71*  < >  --    CREATININE 5.43* 5.08* 4.42* 4.04* 3.96*  < >  --    GLUCOSE 116* 106* 120* 175* 146*  < >  --    CALCIUM 8.6 9.4 9.1 8.7 9.6  < >  --    MAGNESIUM  --  2.8* 2.8* 2.6* 2.9*  --  2.6*    PHOSPHORUS 4.4 3.6 4.7 3.5 3.8  < >  --    < > = values in this interval not displayed.              Lab 05/09/14  0234 05/08/14  1412 05/06/14  0204 05/03/14  2359   POC LACTATE 1.0 2.9* 0.4* 1.0           Lab 05/10/14  0626 05/10/14  0117 05/08/14  1408 05/08/14  0957 05/07/14  2013  05/03/14  2359   ALK PHOS 64  --  73  --   --   --  73   AST 97*  --  95*  --   --   --  69*   ALT 76*  --  68*  --   --   --  47   BILIRUBIN, TOTAL 8.4*  --  7.8*  --   --   --  4.4*   BILIRUBIN, DIRECT 6.23*  --  5.08*  --   --   --  3.10*   TOTAL PROTEIN, POC 6.4  --  7.2  --   --   --  7.1   ALBUMIN 2.9*   2.9* 3.2* 3.1* 3.5 3.0*  < > 3.1*   3.1*   < > = values in this interval not displayed.        Lab 05/08/14  1408 05/07/14  2013 05/06/14  0526 05/05/14  0441 05/03/14  2359   INR POC 1.4* 1.4* 1.4* 1.6* 1.8*   PROTHROMBIN TIME 16.9* 16.3* 16.4* 18.6* 20.1*             ------      ------      ------      ------      ------      ------      ------      ------      ------      ------      ------      ------    Aforementioned assessment and plan relayed to the team.   Information will be relayed to family and patient.  Thank  you for taking the time to read this note  [facility]  [facility]

## 2014-05-10 NOTE — Unmapped (Signed)
ICU ATTENDING PROGRESS NOTE    I have examined Gabrielle Rivera, reviewed the events of the previous 24 hours, reviewed, confirmed and amended the resident's data, history and physical exam as needed. The case has been discussed with the MICU team. I note the following in my assessment and will implement this plan of coordinated critical care management as follows:    I note the following: pt is a 64 yo admitted HD 6, with heart failure and severe valve disease --not surgically amendable with altered mental status acute hepatic congestion and AKI    HD#7  Family meeting given overall poor prognosis --meeting with hospice is more than appropriate   Will only be able to adjust physiologically so much given her end stage disease  Over dieresis need IVF--now somewhat clinically stable  vigeilo and monitor scvo2  Monitor o2 needs pul toilet  Chronic VT  Plan on delicate balance of fluid removal and fluid volume  Total 7 d of abx for infection pna  D/w cards and renal at length will not place a PAC or do RRT at this time  DNR-CCA        RESPIRATORY:  Hypoxia     CARDIOVASCULAR:  Acidosis  Cardiomyopathy  Coronary artery disease  Hyperlipidemia  Hypertension, unspecified  Ventricular Tachycardia, Paroxysmal  Volume depletion  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     NUTRITION:  Malnutrition, moderate (albumin 2.4-3.0)     GASTROINTESTINAL:  Acute hepatic failure     FLUIDS / ELECTROLYTES:  Dehydration     RENAL:  AKI   CKD      HEMATOLOGIC:   No acute issues     ENDOCRINE:   No acute issues     INFECTIOUS DISEASE:   Bacterial pneumonia      NEUROLOGIC:  Altered Mental Status      PSYCHIATRIC, PAIN, SEDATION:  No acute issues     INJURY / DISEASE SPECIFIC NEEDS:  No acute issues   GI Prophylaxis: Yes  DVT Prophylaxis: Yes    ACTIVE LINES;   Patient Lines/Drains/Airways Status    Active Epidural Line / PICC Line / PIV Line / ART Line / Line / CVC Line     Name:   Placement date:   Placement time:   Site:   Days:    Arterial Line 05/08/14 Left  Femoral  05/08/14   2130   Femoral   1    CVC Triple Lumen 05/08/14 Left Femoral  05/08/14   2130   Femoral   1              ACTIVE DRAINS:  Foley    DISPOSITION:   Remain in ICU    Total critical care time spent caring for this patient over the past 24 hours: 43 minutes    [name]  05/10/2014  5:27 PM

## 2014-05-10 NOTE — Unmapped (Signed)
Problem: Compromised Skin Integrity  Use this problem when pressure ulcers are classified as stage I or II.   Goal: Nutritional status is improving  Monitor and assess patient for malnutrition (ex- brittle hair, bruises, dry skin, pale skin and conjunctiva, muscle wasting, smooth red tongue, and disorientation). Collaborate with interdisciplinary team and initiate plan and interventions as ordered. Monitor patient???s weight and dietary intake as ordered or per policy. Utilize nutrition screening tool and intervene per policy. Determine patient???s food preferences and provide high-protein, high-caloric foods as appropriate.   Patient nutritional status improving, patient currently at goal of 40, patient tolerating tube feedings without complications.

## 2014-05-10 NOTE — Unmapped (Signed)
Renal Consult Follow Up Note      Chief complaint on admission : concern for hepatic failure/AKI    Reason for Renal Consult : AKI       HPI:  Gabrielle Rivera is a 64 y.o. 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 As transfer from [facility] for continued evaluation of AMS/renal failure/liver dysfunction?? for whom we are consulted for AKI.    Patient recently diagnosed with uterine fibroid and was taking Tylenol for pain although levels were not largely elevated on admission to [facility].    Patient presented 05/02/14 to [facility] with N/V/abd pain noted to have elevated INR 1.5 and elevated T bili to 1.10. BUN/Cr on admission was 59/2.78 with baseline ~2.0.?? U/S Abd showed diffuse gallbladder thickening which couple imply cholecystitis without stones found.?? 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 Patient was taking Lasix 40 mg BID / Lisinopril 2.5 mg which were held on admission due to AKI; light hydration NS @ 50 ml/hr per chart.    Patient became altered and started having hallucinations and was sedated/intubated for imaging of head. CT head without acute findings.    Contrast exposure:?? None  Nephrotoxic drug exposure: None  Hypotensive episodes: None    Past medical, family, and social histories were reviewed as previously documented. Updates were made as necessary.    Interval History:   UO has improved but Cr continues to rise and patient is now hypernatremic    HISTORIES.    PFSH unchanged    ROS.  Confused cannot be obtained      PHYSICAL EXAM    Temp:  [97.4 ??F (36.3 ??C)-98.4 ??F (36.9 ??C)] 97.4 ??F (36.3 ??C)  Heart Rate:  [91-125] 91  Resp:  [16-42] 16  BP: (70-110)/(50-75) 91/50 mmHg  Arterial Line BP: (81-137)/(44-82) 123/69 mmHg  FiO2:  [3 %] 3 %    Wt Readings from Last 3 Encounters:   05/10/14 126 lb 8.7 oz (57.4 kg)          Intake/Output Summary (Last 24 hours) at 05/10/14 1457  Last data filed at 05/10/14 1445   Gross per 24 hour   Intake   1885 ml   Output   1080 ml   Net    805 ml       Constitutional :AMS  Skin :   No rash, lesions, warm to touch  HENT:  Normocephalic, external ears and nose normal  Eyes :   no injection, icterus, EOM grossly intact.  Neck :   supple, No tracheal shift, no Thy mass  Chest :  No respiratory distress, On ventilator.     clear to auscultation, BS vesicular +0  CVS :   HR regular, HS S1 S2 + 0    LE edema neg  ABD :   non-distended, soft, non-tender,   Psych :  AMS      No Known Allergies    MEDS     Scheduled Meds:  ??? aspirin  81 mg Oral Daily with breakfast   ??? atorvastatin  10 mg Oral Nightly (2100)   ??? famotidine  20 mg Oral Daily 0900   ??? flu vaccine (seasonal < 65 yr)(PF) IM 0.5 mL  0.5 mL Intramuscular During Hospitalization   ??? heparin (porcine)  5,000 Units Subcutaneous 3 times  per day   ??? metoprolol tartrate  25 mg Oral BID   ??? senna-docusate  1 tablet Oral BID   ??? [DISCONTINUED] piperacillin-tazobactam (ZOSYN) IV extended interval  3.375 g Intravenous Q12H     Continuous Infusions:  ??? electrolyte-R (pH 7.4) 125 mL/hr (05/10/14 1315)     PRN Meds:.ondansetron, vancomycin intermittent/pulse dosing      DATA :       Date 05/09/14 0700 - 05/10/14 0659 05/10/14 0700 - 05/11/14 0659   Shift 0700-1459 1500-2259 2300-0659 24 Hour Total 0700-1459 1500-2259 2300-0659 24 Hour Total   I  N  T  A  K  E   I.V.  (mL/kg) 41.3  (0.8) 1098  (20.6) 558  (9.7) 1697.3  (29.6)          I.V.  1098 558 1656          Volume (mL) Furosemide 41.3   41.3        NG/GT   179 179          Flushes (mL) (Feeding Tube Nasogastric Right)   100 100          Intake (mL) (Feeding Tube Nasogastric Right)   79 79        IV Piggyback   50 50          Volume (mL) (piperacillin-tazobactam (ZOSYN) in dextrose, iso-osm 50 mL IVPB 3.375 g)   50 50        Shift Total  (mL/kg) 41.3  (0.8) 1098  (20.6) 787  (13.7) 1926.3  (33.6)        O  U  T  P  U  T   Urine  (mL/kg/hr) 735  (1.7) 575  (1.4) 325  (0.7) 1635  (1.2) 180   180      Output (mL) (Urethral Catheter Latex;Double-lumen) 735 [phone] 180   180    Stool              Stool Occurrence 1 x 1 x  2 x        Shift Total  (mL/kg) 735  (13.8) 575  (10.8) 325  (5.7) 1635  (28.5) 180  (3.1)   180  (3.1)   Weight (kg) 53.2 53.2 57.4 57.4 57.4 57.4 57.4 57.4       Lab Results   Component Value Date    CREATININE 5.43* 05/10/2014    BUN 100* 05/10/2014    NA 151* 05/10/2014    K 3.8 05/10/2014    CL 114* 05/10/2014    CO2 23 05/10/2014     Lab Results   Component Value Date    CALCIUM 8.6 05/10/2014    PHOS 4.4 05/10/2014     No results found for: [facility]  Lab Results   Component Value Date    WBC 9.7 05/10/2014    HGB 9.1* 05/10/2014    HCT 29.4* 05/10/2014    MCV 79.1* 05/10/2014    PLT 210 05/10/2014     Lab Results   Component Value Date    IRON 17* 05/04/2014    TIBC 377 05/04/2014    FERRITIN 42.4 05/04/2014     Lab Results   Component Value Date    KETONESU Negative 05/08/2014    BLOODU Large* 05/08/2014    BILIRUBINUR Negative 05/08/2014    UROBILINOGEN <2.0 05/08/2014    PROTEINUA 100* 05/08/2014    NITRITE Negative 05/08/2014    LEUKOCYTESUR Moderate* 05/08/2014    PHUR  6.0 05/08/2014    LABSPEC 1.017 05/08/2014    CLARITYU Cloudy* 05/08/2014    COLORU Amber* 05/08/2014           In addition to the above, I have reviewed an extensive amount of complex data.    Morbidity / complication risk is felt to be :  high.      Assessment/Plan:   Gabrielle Rivera is a 64 y.o. 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 As transfer from [facility] for continued evaluation of AMS/renal failure/liver dysfunction?? for whom we are consulted for AKI.    #.) Anuric AKI on CKD  - Baseline SCr 2.5 likely 2/2 poor perfusion CHF/HTN; R kidney ~ 9 cm at OSH suggesting chronic disease  - Unclear proteinuria prior to AKI; no  records in system  - Suspect AKI etiology pre-renal to ATN on ACE vs. CRS vs. HRS  - UNa at OSH 28; suggesting pre-renal picture but unlikely HRS especially given liver not acutely decompensated  - Insults include: Lisinopril 2.5 mg at home stopped at OSH. Depressed cardiac function.   - Renal function was initially improving with Cr back to baseline and UO adequate without diuretics. Patient however had an NSTEMI with tachyarrhythmias and increased oxygen requirement. Transferred back to MICU. Cr has bumped since then. UO has improved with IV hydration. Patient continues to be hypernatremic      Plan:   - Patient has very poor cardiac function and is to be transferred to hospice for CHF.   - Will recommend continuing with IV hydration and free water boluses for today.   - Hopefully patient will not require RRT during this admission. Patient however does have baseline advanced CKD and a depressed cardiac function so the question of RRT might arise in the future. Generally patient's with poor cardiac function do not do well on dialysis. Will discuss this with family.     Thank you for the consult.      [facility]  05/10/2014    Discussed with Consult staff Dr Olga Millers

## 2014-05-10 NOTE — Unmapped (Signed)
Problem: Compromised Skin Integrity  Use this problem when pressure ulcers are classified as stage I or II.   Goal: Skin integrity is maintained or improved  Assess and monitor skin integrity. Identify patients at risk for skin breakdown on admission and per policy. Collaborate with interdisciplinary team and initiate plans and interventions as needed.   Pt turned q2 hours with pillow support. Monitored for moisture and incontinence. TF started for nutrition. Will continue to monitor.

## 2014-05-10 NOTE — Unmapped (Signed)
[facility]   Medical Nutrition Therapy  Follow-Up    Diet Order/Nutrition Support: Novasource Renal @ 40 ml/hr (1,920 kcal, 87g pro, 176g CHO, 688 ml free water) ??    Pertinent Information: Patient extubated since time of previous assessment, noted patient initially transitioned to dysphagia 1 nectar thick diet per SLP but was changed to NPO with TF after SLP re-assessment on 05/09/14. Tolerating TF at 20 ml/hr, spoke with patient RN, plan to increase today. SLP assessed patient this morning, continuing to recommend NPO with TF as patient drowsy, patient cleared to have small sips of water when awake. + BM 05/09/14.     Scheduled Meds:   ??? aspirin  81 mg Oral Daily with breakfast   ??? atorvastatin  10 mg Oral Nightly (2100)   ??? famotidine  20 mg Oral Daily 0900   ??? flu vaccine (seasonal < 65 yr)(PF) IM 0.5 mL  0.5 mL Intramuscular During Hospitalization   ??? heparin (porcine)  5,000 Units Subcutaneous 3 times per day   ??? metoprolol tartrate  25 mg Oral BID   ??? piperacillin-tazobactam (ZOSYN) IV extended interval  3.375 g Intravenous Q12H   ??? senna-docusate  1 tablet Oral BID      Continuous Infusions:    PRN Meds:ondansetron, vancomycin intermittent/pulse dosing     Pertinent Labs:   Lab Results   Component Value Date    CREATININE 5.43* 05/10/2014    BUN 100* 05/10/2014    NA 151* 05/10/2014    K 3.8 05/10/2014    CL 114* 05/10/2014    CO2 23 05/10/2014     Lab Results   Component Value Date    CALCIUM 8.6 05/10/2014    PHOS 4.4 05/10/2014     Lab Results   Component Value Date    MG 2.8* 05/10/2014     Lab Results   Component Value Date    GLUCOSE 116* 05/10/2014     Lab Results   Component Value Date    WBC 9.7 05/10/2014     Lab Results   Component Value Date    ALBUMIN 2.9* 05/10/2014     No results found for: PREALBUMIN  No results found for: CRP    Estimated Nutrition Needs - recalculated for extubation : ??  Kcals/day: 1,734 kcal (30 kcal/kg) ??  Protein g/day: 58-69 g (1-1.2 g/kg)  ??  Fluid ml/day: 1 ml/kcal ??  Needs Based On: 57.8 kg - actual body weight     Established Nutrition Diagnosis  Nutrition Diagnosis: Inadequate energy intake  Related To: ventilation/sedation  As Evidenced By: need for enteral nutrition    Established Nutrition Intervention: Modify Enteral Nutrition    Established Goals: Tolerate enteral nutritional within 2-3 days    Goals: No longer applicable - extubated    Additional Nutrition Related Problems  Nutrition Diagnosis: Swallowing difficulty   Related To: oropharyngeal dysphagia   As Evidenced By: NPO with TF     Additional Recommended Interventions: TF vs. PO diet per SLP    Additional Goals: Continue to receive nutrition     Nutrition Status Classification: Severely Compromised    Follow up in 3 days.    Additional Recommendation(s) to Physicians:   ?? Reduce goal rate of Novasource Renal to 36 ml/hr (1,728 kcal, 78g pro, 158g CHO, 619 ml free water) to more closely match estimated protein needs     Rachel Bo MS, RD, LD  Pager [phone]

## 2014-05-10 NOTE — Unmapped (Signed)
[place]   Social Work Psychosocial Assessment     Mylin Gignac  [id number]  64 y.o.  female  Black or African American  Marital Status: Single    Encephalopathy [G93.40]    Referred by: MD  Referred Reason: Hospice Consult      History    Past Medical History   Diagnosis Date   ??? CHF (congestive heart failure)    ??? Asthma    ??? Hypertension    ??? Hyperlipemia    ??? Stroke 2014   ??? Hepatitis C    ??? Dysphagia        History   Drug Use   ??? Yes     Comment: cocaine, heroin       History   Alcohol Use No       Mental Health History: substance use    Mental Status    Current Mental Status: Unable to Assess  Mental Status Prior to Admission: Unable to Assess  Activities of Daily Living: Independent       Current Living Arrangements    Current Living Arrangements: With Family  Type of Living Arrangement: Home/Apartment  Living Arrangements Comments: Pt resides with her son               Support Systems      Next of Kin/Contact Person: Kamaree Berkel     Next of Kin Phone Number: [phone]            [employer] Used Prior to Admission: Unknown             Cultural/Spiritual/Language Barriers     None    Other Pertinent Data                                Case Production designer, theatre/television/film for Mental Health IssuesPrior to Admission: Unknown          Durable Medical Equipment Prior to Admission: Unknown                     Assessment/Plan    Reviewed chart.  Pt is a 64 year old SAAF with a history of HFrEF EF 15-20%, CAD s/p DES, CVA, HTN, HLD, and stage III CKD.  Pt was a transfer from OSH with concern for fulminant hepatic failure.  SW consulted for assist in family meeting as MD discussed pt overall poor prognosis with pt son.  Pt is single, has one son, parents are deceased, no siblings and resides with her son.  SW and MD met with pt son in MICU conference room.  All options were provided for plan of care and code status was discussed.  Pt son would like to meet with [facility] today.  A referral was made.  Pt  son also made pt a DNRCC-A.  However, if pt were to go into respiratory distress, son would like intubation.  SW will follow.      Patient/Family aware and taking part in the discharge plan.  Patient and family were offered a post-acute provider list as applicable to the discharge plan and insurance provider.  Patient and family were given the freedom to choose providers and financial interest(s) were disclosed as appropriate.

## 2014-05-10 NOTE — Unmapped (Signed)
Patient resting in bed at this time. Patient tolerating medications without complications. Patient made [facility] today son into visit. Patient currently at goal with tubing feeding 40 of nova source. Patient continues with alerted mental status patient continues to be reoriented to surroundings. Call light in reach

## 2014-05-10 NOTE — Unmapped (Signed)
Cardiology Consult Progress Note      Reason for consult     Quanesha Klimaszewski is a 64 y.o. female on hospital day 7.  The principal reason for today's follow up visit is Acute respiratory failure with hypoxia and hypercarbia.       Interval History / Subjective     Patient required 2 doses of haldol and 1mg  IV ativan due to increasing anxiety. She continues to have multiple episodes of Vtach, one of which was followed by bradycardia and drop in BP. Her hypernatremia and creatinine worsened.      Review of Systems (Focused)     Unable to obtain due to AMS.      Medications     Scheduled Meds:  ??? aspirin  81 mg Oral Daily with breakfast   ??? atorvastatin  10 mg Oral Nightly (2100)   ??? famotidine  20 mg Oral Daily 0900   ??? flu vaccine (seasonal < 65 yr)(PF) IM 0.5 mL  0.5 mL Intramuscular During Hospitalization   ??? heparin (porcine)  5,000 Units Subcutaneous 3 times per day   ??? metoprolol tartrate  25 mg Oral BID   ??? piperacillin-tazobactam (ZOSYN) IV extended interval  3.375 g Intravenous Q12H   ??? senna-docusate  1 tablet Oral BID     Continuous Infusions:   PRN Meds:  ondansetron, vancomycin intermittent/pulse dosing       Vital Signs     Temp:  [97.5 ??F (36.4 ??C)-98.2 ??F (36.8 ??C)] 97.9 ??F (36.6 ??C)  Heart Rate:  [107-125] 107  Resp:  [23-42] 27  BP: (70-116)/(50-75) 91/50 mmHg  Arterial Line BP: (81-137)/(44-82) 93/50 mmHg  FiO2:  [3 %] 3 %    Intake/Output Summary (Last 24 hours) at 05/10/14 0813  Last data filed at 05/10/14 0612   Gross per 24 hour   Intake 1926.3 ml   Output   1570 ml   Net  356.3 ml         Physical Exam     General appearance: sleeping in bed  Lungs: clear to auscultation bilaterally  Heart: tachycardic; 3/6 holosystolic blowing murmur  Abdomen: normal bowel sounds present; soft, non-distended  Extremities: no lower extremity edema noted bilaterally  Pulses: 2+ pulses bilaterally - radial and dorsalis pedis  Skin: warm and dry  Neurologic: does not interact during interview or respond to  questions    Laboratory Data         Lab 05/10/14  0117 05/09/14  0234 05/08/14  0957 05/07/14  2013   WBC 9.7 8.7 10.3 8.1   HEMOGLOBIN, POC 9.1* 8.6* 10.2* 8.5*   HEMATOCRIT BLOOD GAS 29.4* 28.4* 33.2* 27.7*   MCV, POC 79.1* 81.0 81.1 80.7   PLATELETS, POC 210 184 176 159           Lab 05/10/14  0626 05/10/14  0117 05/09/14  0234 05/08/14  1408   SODIUM 151* 150* 149* 148*   POTASSIUM 3.8 3.8 4.2 4.0   CHLORIDE 114* 113* 114* 113*   CARBON DIOXIDE (CO2) 23 21 23  20*   BUN 100* 97* 85* 71*   CREATININE 5.43* 5.08* 4.42* 4.04*   GLUCOSE 116* 106* 120* 175*           Lab 05/10/14  0626 05/10/14  0117 05/09/14  0234 05/08/14  1408 05/08/14  0957   CALCIUM 8.6 9.4 9.1 8.7 9.6   MAGNESIUM  --  2.8* 2.8* 2.6* 2.9*   PHOSPHORUS 4.4 3.6 4.7 3.5 3.8  Lab 05/08/14  1408 05/07/14  2013 05/06/14  0526 05/05/14  0441   INR POC 1.4* 1.4* 1.4* 1.6*   PROTHROMBIN TIME 16.9* 16.3* 16.4* 18.6*           Lab 05/10/14  0626 05/10/14  0117 05/08/14  1408 05/08/14  0957  05/03/14  2359   ALT 76*  --  68*  --   --  47   AST 97*  --  95*  --   --  69*   ALK PHOS 64  --  73  --   --  73   BILIRUBIN, TOTAL 8.4*  --  7.8*  --   --  4.4*   BILIRUBIN, DIRECT 6.23*  --  5.08*  --   --  3.10*   ALBUMIN 2.9*   2.9* 3.2* 3.1* 3.5  < > 3.1*   3.1*   < > = values in this interval not displayed.        Lab 05/08/14  1315 05/04/14  1729 05/04/14  0327   COLOR, URINE, POC Amber* Yellow Yellow   CLARITY, URINE, POC Cloudy* Clear Slightly-Cloudy*   SPECIFIC GRAVITY, URINE 1.017 1.008 1.009   PH UA 6.0 6.0 6.0   PROTEIN UA, POC 100* Negative Negative   GLUCOSE UA, POC Negative Negative Negative   KETONES UA, POC Negative Negative Negative   BILIRUBIN UA, POC Negative Negative Negative   BLOOD UA, POC Large* Moderate* Large*   NITRITE UA Negative Negative Negative   UROBILINOGEN UA, POC <2.0 <2.0 <2.0   LEUKOCYTES UA, POC Moderate* Trace* Negative   RBC UA 42* 6* 7*   WBC UA 39* 4 4   BACTERIA Many* Rare* Rare*   HYALINE CASTS 4*  --   --    MUCUS  UA, POC Present*  --   --            Lab 05/10/14  0626 05/10/14  0117 05/09/14  2050 05/09/14  1500  05/08/14  0957   CK TOTAL  --   --   --  240*  --  205   TROPONIN I 2.63* 3.16* 3.03* 2.87*  < > 2.59*   < > = values in this interval not displayed.    Lab Results   Component Value Date    NTPROBNP 454098* 05/10/2014    NTPROBNP 29541* 05/03/2014       Lab Results   Component Value Date    TSH 0.40 05/07/2014    FREET4 0.87 05/07/2014           Lab 05/07/14  1238 05/05/14  0958 05/04/14  2036 05/04/14  1553   GLU MONITORING DEVICE, POC 134* 106* 193* 199*           Diagnostic Studies     X-ray portable feeding tube:    Tip of the feeding tube overlies the gastroesophageal junction, recommend advancing.  ??  Enlarged cardiac silhouette is unchanged.      Assessment & Plan     Kaytlen Lightsey is a 64 y.o. female on HD# 7 with Acute respiratory failure with hypoxia and hypercarbia.  The medical issues being addressed in today's encounter are as follows:    Principal Problem:    Acute respiratory failure with hypoxia and hypercarbia  Active Problems:    Hepatitis C    AKI (acute kidney injury)      1. Troponinemia  - Troponin peaked again overnight and is downtrending again. 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 Elevated troponin is likely due to demand ischemia. ??  ????????????????????????- There is no need for catheterization at this time. If she becomes hemodynamically unstable, can consider.??  ????????????????????????- Continue ASA, atorvastatin, BB,    - Could consider Heparin gtt    2. Biventricular heart failure  - Former Designer, fashion/clothing numbers indicated that the patient had adequate to high cardiac output. Urine output has increased with hydration, however hypernatremia and renal fx has not improved. While significantly and chronically elevated BNP can be a poor prognostic indicator for heart failure, BNP can be elevated in the setting of renal insufficiency. Given her worsening renal function her current elevated BNP should be  interpreted with caution   - Ideally pt needs swan catheter for best assessment of her volume status to guide for management of the HFrEF in the setting of potential infection. Gaol of care should be discussed with family. Pt may need inotropic support.  ????????????????????????- Recommend avoiding diuretics and administering gentle fluid replacement. Pt may have ATN now. Would recommend f/u with renal recs.  ??????????????????????- Keep K>4 and Mg>2  ????????????????????????- No ACIE or ARBs due to worsening AKI,   ?????????????????????? - Daily weight and I/O  ?????????????????????? - Renal panel Q12    3. Arrhythmia  - NSVT; Could consider starting amiodarone     Pt was discussed with Cardiology attending Dr. Senaida Ores, MS  Canna Nickelson Khaleghi

## 2014-05-10 NOTE — Unmapped (Signed)
Upon the recommendation of the previous shift's chaplain, I visited Gabrielle Rivera who was alone and unconscious.  I provided pastoral presence for the patient and offered silent prayer for her.  Follow-up visitation is recommended.  Chaplain Gaetana Michaelis, D.Min.

## 2014-05-10 NOTE — Unmapped (Signed)
Pt very anxious at this time. Pt has been given haldol x2 and 1 mg ativan IVpush. Pt remains tachypneic to 40s at times. Unable to follow commands and attempting to pull at lines and tubes. Pt has been reoriented multiple times. Pt has had multiple runs of Vtach. Most recent run of Vtach was follow by brief period of bradycardia of 40s-50s and drop in BP. Marciano Sequin, MD and Aline August, MD have been made aware of pt status. MD at bedside. No new orders at this time. Will continue to monitor.

## 2014-05-10 NOTE — Unmapped (Signed)
Problem: Non-violent, non-self-destructive restraints  Less restrictive alternative interventions will be considered prior to application of restraint devices.   Goal: Patient will be restrained only to maintain safety  Alternatives to restraints can include; moving the patient closer to the nurses station, video monitoring where available, medicating for pain, agitation or psychosis, psychosocial interventions, manipulation of environment, frequent toileting, diversional activities, bed alarms, having family members sit with patient, frequent reorientation, or repositioning.   Pt is x4 restraints with posey. Pt has attempted to get out of bed and continues to attempt to pull and lines. Pt intermittently unable to follow commands. Pt extremely anxious. Multiple attempts have been made to reorient pt to room. Will continue to monitor

## 2014-05-10 NOTE — Unmapped (Signed)
Son will meet with [facility] tomorrow at 10a.m.  RN/MD aware.

## 2014-05-10 NOTE — Unmapped (Signed)
Problem: Non-violent, non-self-destructive restraints  Less restrictive alternative interventions will be considered prior to application of restraint devices.   Goal: Patient will be restrained only to maintain safety  Alternatives to restraints can include; moving the patient closer to the nurses station, video monitoring where available, medicating for pain, agitation or psychosis, psychosocial interventions, manipulation of environment, frequent toileting, diversional activities, bed alarms, having family members sit with patient, frequent reorientation, or repositioning.   Patient remains in bilateral soft wrist restraints for protection from medical devices.

## 2014-05-10 NOTE — Unmapped (Signed)
Met with son regarding goals of care    Son reported that patient would not want to be kept on machines. He realizes that patient's heart is failing and there are no options to cure the heart failure. He also know that patient has two valves that are very poorly functioning.     The son reported that if the patient has cardiac arrest, not to pursue with chest compressions or breathing tube.   However, if patient had reversible respiratory failure, he would want to pursue intubation.     Patient will be DNR-CCA    Signed,   Bonnita Nasuti, MD

## 2014-05-10 NOTE — Unmapped (Signed)
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 Intubated 2/2 - 2/5. CXR (05/05/14): left lower lobe collapse and airspace opacity at right base, atelectasis or infiltrate. SLP History-none.     Assessment:   Patient continues to demonstrate oropharyngeal dysphagia secondary to reduced oral-motor status and delayed swallow initiation, significantly complicated by weak cough and overall reduced cognition. Pt significantly less alert and interactive as compared to evaluation. Patient demonstrated signs and symptoms of penetration/aspiration with puree only this date via delayed cough on 3/3 bolus presentations with oral holding. Although pt demonstrated oral holding and delayed swallow initiation with thins, she had no overt s/s of penetration/aspiration with thin liquids.  Patient is at an increased risk of aspiration and should remain NPO with tube feedings with small sips of thin water allowed after oral care.    Plan:  1. SLP therapy 1-3x/week while inpatient   2. SLP at discharge is recommended   3. Diet Recommended: NPO with TF, good oral care, small sips of water okay after oral care.    Subjective:  Alert and Oriented x none, although did nod head inconsistently across trials. No reports of pain. Patient lethargic requiring verbal and tactile cues to participate in PO trials.    Objective:  PO Trials Administered:     Consistency   Number/Order of Trials  Signs/Symptoms Observed    Thin Liquid 1-5 No s/s    Puree 6-8 Delayed cough in 3/3 trials.    Soft Solid DNT     Hard Solid DNT      Goals  1. Patient will tolerate least restrictive diet with no  signs or symptoms of aspiration at 100% accuracy: with min cues.   >In progress; patient demonstrating s/s of pen/asp with puree and no overt s/s on thins  2. Patient will demonstrate use of swallowing exercises: for oral and pharyngeal strengthening at 100% accuracy with min cues.   >Attempted; patient unable maintain an alert state long enough to participate and follow commands  3. Patient and/or caregiver will demonstrate ability to prepare food and liquid consistencies at 100% accuracy: with min cues.   >Not addressed this date as pt is NPO  4. Patient and/or caregiver will demonstrate understanding of clinical signs and symptoms of aspiration at 100% accuracy: with min cues.  >>In progress; patient verbally educated, but unable to demonstrate understanding despite max cues   5. Patient and/or caregiver will demonstrate understanding of risks associated with aspiration at 100% accuracy: with min cues.   >>In progress; patient verbally educated, but unable to demonstrate understanding despite max cues   Time frame for goal to be met in: 2.19.16    Education:  Patient educated on role of SLP, current POC, and discharge recommendations for SLP therapy.   Patient educated on swallowing anatomy & physiology, dysphagia, risks of aspiration, current diet recommendations, and safe swallowing behaviors.   Patient was unable to demonstrate understanding.      Alen Blew, M.A., CCC-SLP, BCS-S  Speech-Language Pathologist  Board Certified Specialist in Swallowing and Swallowing Disorders  MBSImP Certified Clinician  [phone] Pager #: [phone]  Weekend Pager #: [phone]  Office #: [phone]  Hours: T & R  0730-1800      Time  Start Time: 0945  Stop Time: 1000  Time Calculation (min): 15 min    Charges   $Swallow Tx : 1 Procedure

## 2014-05-11 ENCOUNTER — Inpatient Hospital Stay: Admit: 2014-05-11 | Payer: PRIVATE HEALTH INSURANCE

## 2014-05-11 LAB — RENAL FUNCTION PANEL W/EGFR
Albumin: 2.8 g/dL — ABNORMAL LOW (ref 3.5–5.7)
Albumin: 3 g/dL — ABNORMAL LOW (ref 3.5–5.7)
Anion Gap: 11 mmol/L (ref 3–16)
Anion Gap: 13 mmol/L (ref 3–16)
BUN: 100 mg/dL — ABNORMAL HIGH (ref 7–25)
BUN: 103 mg/dL — ABNORMAL HIGH (ref 7–25)
CO2: 25 mmol/L (ref 21–33)
CO2: 24 mmol/L (ref 21–33)
Calcium: 8.5 mg/dL — ABNORMAL LOW (ref 8.6–10.3)
Calcium: 8.6 mg/dL (ref 8.6–10.3)
Chloride: 114 mmol/L — ABNORMAL HIGH (ref 98–110)
Chloride: 114 mmol/L — ABNORMAL HIGH (ref 98–110)
Creatinine: 5.01 mg/dL — ABNORMAL HIGH (ref 0.60–1.30)
Creatinine: 5.16 mg/dL — ABNORMAL HIGH (ref 0.60–1.30)
GFR MDRD Af Amer: 11 See note.
GFR MDRD Af Amer: 10 See note.
GFR MDRD Non Af Amer: 9 See note.
GFR MDRD Non Af Amer: 8 See note.
Glucose: 157 mg/dL — ABNORMAL HIGH (ref 70–100)
Glucose: 168 mg/dL — ABNORMAL HIGH (ref 70–100)
Osmolality, Calculated: 344 mosm/kg — ABNORMAL HIGH (ref 278–305)
Osmolality, Calculated: 348 mosm/kg — ABNORMAL HIGH (ref 278–305)
Phosphorus: 3 mg/dL (ref 2.1–4.7)
Phosphorus: 2.5 mg/dL (ref 2.1–4.7)
Potassium: 3.3 mmol/L — ABNORMAL LOW (ref 3.5–5.3)
Potassium: 3.9 mmol/L (ref 3.5–5.3)
Sodium: 150 mmol/L — ABNORMAL HIGH (ref 133–146)
Sodium: 151 mmol/L — ABNORMAL HIGH (ref 133–146)

## 2014-05-11 LAB — HEPATIC FUNCTION PANEL
ALT: 82 U/L (ref 7–52)
AST: 117 U/L (ref 13–39)
Albumin: 2.8 g/dL (ref 3.5–5.7)
Alkaline Phosphatase: 102 U/L (ref 36–125)
Bilirubin, Direct: 6.15 mg/dL (ref 0.00–0.40)
Bilirubin, Indirect: 3.25 mg/dL (ref 0.00–1.10)
Total Bilirubin: 9.4 mg/dL (ref 0.0–1.5)
Total Protein: 6.5 g/dL (ref 6.4–8.9)

## 2014-05-11 LAB — CBC
Hematocrit: 26.3 % — ABNORMAL LOW (ref 35.0–45.0)
Hemoglobin: 8.1 g/dL — ABNORMAL LOW (ref 11.7–15.5)
MCH: 24.8 pg — ABNORMAL LOW (ref 27.0–33.0)
MCHC: 30.9 g/dL — ABNORMAL LOW (ref 32.0–36.0)
MCV: 80.3 fL (ref 80.0–100.0)
MPV: 9.4 fL (ref 7.5–11.5)
Platelets: 187 10*3/uL (ref 140–400)
RBC: 3.28 10*6/uL — ABNORMAL LOW (ref 3.80–5.10)
RDW: 19.2 % — ABNORMAL HIGH (ref 11.0–15.0)
WBC: 8.9 10*3/uL (ref 3.8–10.8)

## 2014-05-11 LAB — PROTIME-INR
INR: 1.4 (ref 0.9–1.1)
Protime: 16.3 seconds (ref 11.6–14.4)

## 2014-05-11 LAB — TROPONIN I
Troponin I: 2.57 ng/mL — CR (ref 0.00–0.03)
Troponin I: 2.44 ng/mL (ref 0.00–0.03)

## 2014-05-11 LAB — MAGNESIUM: Magnesium: 3.1 mg/dL — ABNORMAL HIGH (ref 1.5–2.5)

## 2014-05-11 MED ORDER — potassium chloride (KCl) 20 mEq/15 mL solution 20 mEq
20 | Freq: Once | ORAL | Status: AC
Start: 2014-05-11 — End: 2014-05-11
  Administered 2014-05-11: 11:00:00 20 meq via ORAL

## 2014-05-11 MED FILL — FAMOTIDINE 20 MG TABLET: 20 20 MG | ORAL | Qty: 1

## 2014-05-11 MED FILL — HEPARIN (PORCINE) 5,000 UNIT/ML INJECTION SOLUTION: 5000 5,000 unit/mL | INTRAMUSCULAR | Qty: 1

## 2014-05-11 MED FILL — POTASSIUM CHLORIDE 20 MEQ/15 ML ORAL LIQUID: 20 20 mEq/15 mL | ORAL | Qty: 30

## 2014-05-11 MED FILL — HALOPERIDOL LACTATE 5 MG/ML INJECTION SOLUTION: 5 5 mg/mL | INTRAMUSCULAR | Qty: 1

## 2014-05-11 MED FILL — METOPROLOL TARTRATE 25 MG TABLET: 25 25 MG | ORAL | Qty: 1

## 2014-05-11 MED FILL — SENNA-S 8.6 MG-50 MG TABLET: 8.6-50 8.6-50 mg | ORAL | Qty: 1

## 2014-05-11 MED FILL — ASPIRIN 81 MG CHEWABLE TABLET: 81 81 MG | ORAL | Qty: 1

## 2014-05-11 NOTE — Unmapped (Signed)
New palliative care consult service note:  Requesting Physician/service: Dr. Lytle Butte  Reason for consult: Goals of care disucssion  LOS:8 days  Code status:DNRCC    HPI: Gabrielle Rivera is a 64 y.o. 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 The patient was admitted on January 1st for nausea, vomiting and altered mental status. The patient apparently had asterixis although normal ammonia, worsening INR and bilirubinemia. The patient also had a AKI on CKD with initial Scr 2.78 and BUN of 57 which increased to 4.7 and 93 respectively. Acetaminophen levels were normal. The treating team there was concerned for possible fulminant hepatitis. UC GI team was contacted and patient was transferred for further evaluation. The patient also had NSTEMI with initial troponin of 2.    The patient was given kayexalate, sodium bicarb, insulin and calcium gluconate for the hyperkalemia at [facility]. The patient had progressing encephalopathy and tachypnea and was intubated to protect her airway.?? Nephrology did evaluate the patient. 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 However, it does not appear the patient was ever adequately challenged with diuresis or albumin. The patient did receive fluids while inpatient.     The patient was eventually managed in the ICU, intubated and transferred to [facility] for further evaluation. The patient had a CT head which was negative. CXR showed cardiomegaly. CT chest showed enlarged RA/RV and enlarged pulmonary vascular. NM-galbladder showed normal liver architecture with some decrease function and gallbladder ejection of 21%. US liver was normal but kidney's had increase echogenicity. TTE showed EF 15-20% with global hypokinesias and RVSP 60 mmHg.??     Upon arrival, the patient was intubated and sedated. Family was present. According to the family, the  patient had been using acetaminophen 500 mg up to two tablets every 4-6 hours four the past four weeks. The patient then started having abdominal pain, nausea and vomiting the past four to five days. The family states she also had decrease urine output during this time. The patient started to become anxious, agitated and had insomnia. The family states she was just not her self. The patient has no prior history of liver disease. No other recent changes in medications. 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 The patient does have history of acute hepatitis in June 2015 with AST at 3500 and ALT at 600. No further workup was done at that time.     She has been extubated but given the worsening mental status, she is currently in restraints.      Functional Hx: Bed bound and completely dependent on others for her ADLs.     PMedHx:  Diagnosis Date   ??? CHF (congestive heart failure)    ??? Asthma    ??? Hypertension    ??? Hyperlipemia    ??? Stroke 2014   ??? Hepatitis C    ??? Dysphagia      PSurgHx:  No history of any surgeries.     FamilyHx:  There is no history of liver disease in her family    SocHx:    reports that she has been smoking Cigarettes.  She has never used smokeless tobacco. She reports that she uses illicit drugs. She reports that she does not drink alcohol.    Allergies:   No Known Allergies  Current medications:  Scheduled Meds:  ??? senna-docusate  1 tablet Oral BID     Continuous Infusions:   PRN Meds:.haloperidol lactate, ondansetron    ROS:  Unable to be obtained from the patient given her mental status.   All other ROS is negative except as noted above.   The following portions of the patient's history were reviewed and updated as appropriate: allergies, current medications, past family history, past medical history, past social history, past surgical history and problem list.    Vitals:  BP 91/50 mmHg   Pulse 103    Temp(Src) 98 ??F (36.7 ??C) (Oral)   Resp 36   Ht 5' 4 (1.626 m)   Wt 120 lb 5.9 oz (54.6 kg)   BMI 20.65 kg/m2   SpO2 89%   Physical Exam :  Pertinent positive findings:   Constitutional:  Sitting up in the bed, AAOX0, alert, in restraints.   Head: Normocephalic and atraumatic.    Mouth/Throat: Oropharynx is clear and moist.   Eyes: Conjunctivae and EOM are normal.   Neck: Normal range of motion. Neck supple. Thyroid: no thyromegaly  Cardiovascular: Normal rate, regular rhythm and normal heart sounds.    Pulmonary/Chest: Effort normal. No respiratory distress.    Abdominal: Soft. Bowel sounds normal.  no distension and no mass.  no tenderness.   Musculoskeletal:   no edema and no tenderness.   Lymphadenopathy:  no cervical/ axillary/ inguinal  adenopathy.   Skin:   warm and dry. No rash noted. No erythema.     Lab review:  Component Value Date    WBC 8.9 05/11/2014    HGB 8.1* 05/11/2014    HCT 26.3* 05/11/2014    MCV 80.3 05/11/2014    PLT 187 05/11/2014     Component Value Date    GLUCOSE 168* 05/11/2014    BUN 103* 05/11/2014    CO2 24 05/11/2014    CREATININE 5.16* 05/11/2014    K 3.9 05/11/2014    NA 151* 05/11/2014    CL 114* 05/11/2014    CALCIUM 8.6 05/11/2014    ALBUMIN 3.0* 05/11/2014    PROT 6.5 05/11/2014    ALKPHOS 102 05/11/2014    ALT 82* 05/11/2014    AST 117* 05/11/2014    BILITOT 9.4* 05/11/2014     Pathology review:  No pathology data to review.     Radiology review:  ---CT of the chest/abd/pelvis without contrast 05/08/14:  IMPRESSION:  Chest:  1. Left upper lobe airspace disease suspicious for pneumonia. There is a tiny left pleural effusion.  2. The dependent airspace disease may represent additional pneumonia or atelectasis.  3. The significance and etiology of the right hilar lymph node enlargement is unclear, possibly reactive.  4. Marked cardiomegaly . The enlarged main pulmonary artery suggest pulmonary hypertension.  Abdomen pelvis:  1. Limited study with no source of sepsis.  2. The  enlarged uterus is nonspecific but likely due to fibroids.  3. The left kidney is mildly atrophic with a small hyperdense lesion, nonspecific.  ---CT of the head w/o contrast 05/08/14:  IMPRESSION:  Remote left middle cerebral artery infarct.  No acute hemorrhages or mass effect.  ---Abdominal US with duplex studies 05/05/14:  IMPRESSION:  Abdomen:  Medical renal disease and renal disease.  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 ??  Liver duplex:  Pulsatility in the portal vein and reversal of flow in the in the hepatic veins, indicating hepatovenous reflux and right heart dysfunction.  ??  Assessment:   Alycea Segoviano is a 64 y.o. woman  with history of end stage liver disease who is admitted to the ICU for management of her complex medical issues. She has been extubated and is currently in restraints.     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 Her prognosis based on her most recent symptoms is days to weeks.     Goals of care: Ms. Ilagan family has been in discussion with the primary team and there are currently plans to transition her to comfort care with the support of hospice.     Advance directives/Health care power of attorney: We also specifically discussed contents of a living will with the patient including resuscitation, intubation, dialysis, IV fluids and tube feeds. We dicussed the particulars of code status. We dicussed the futility of aggressive resuscitative measures such as CPR, intubation and dialysis in the context of terminal illness. We discussed doing time limited trial of IV fluids and tube feeds with eventual goal of stopping these interventions as they will not change the final outcome in the setting of terminal illness. Her code status per the wishes of her family who are her HCPOA has been changed to DNR/CC.     Normajean Baxter, MD    Thank you for allowing the palliative care team to participate in the care of this patient. Please call the  palliative care service for any questions or concerns at [phone] during business hours (M-Fri 8-4pm)    Total time of visit:70 minutes  More than 50% of visit time today was spent face to face with the patient in the following activities:  Provided counseling to patient and family regarding goals of care, pain/symptom management.

## 2014-05-11 NOTE — Unmapped (Signed)
Problem: Non-violent, non-self-destructive restraints  Less restrictive alternative interventions will be considered prior to application of restraint devices.   Goal: Patient will be restrained only to maintain safety  Alternatives to restraints can include; moving the patient closer to the nurses station, video monitoring where available, medicating for pain, agitation or psychosis, psychosocial interventions, manipulation of environment, frequent toileting, diversional activities, bed alarms, having family members sit with patient, frequent reorientation, or repositioning.   Outcome: Not Progressing  Pt is in 4 point soft restraints & posey belt for her own protection.Pt is CAM+  And unaware of her surroundings. Pt does not meet D/c criteria at this time. Will continue to assess and monitor.

## 2014-05-11 NOTE — Unmapped (Signed)
[facility]  Department of Internal Medicine  Inpatient Discharge Summary    Patient: Gabrielle Rivera   MRN: [id number]   CSN: [id number]    Date of Admission: 05/03/2014  Date of Discharge: 05/11/2014   Attending Physician: Daiva Huge, MD       Diagnoses Present on Admission     Past Medical History   Diagnosis Date   ??? CHF (congestive heart failure)    ??? Asthma    ??? Hypertension    ??? Hyperlipemia    ??? Stroke 2014   ??? Hepatitis C    ??? Dysphagia           Discharge Diagnoses     Active Hospital Problems    Diagnosis Date Noted   ??? Acute respiratory failure with hypoxia and hypercarbia [J96.01] 05/03/2014   ??? AKI (acute kidney injury) [N17.9] 05/04/2014   ??? Hepatitis C [B19.20]       Resolved Hospital Problems    Diagnosis Date Noted Date Resolved   No resolved problems to display.         Notable Images/Procedures (include dates)     CXR 05/11/14  2-lead pacer/AICD is stable in position with intact leads. Cardiomegaly is unchanged. Feeding tube passes below the diaphragm, tip not visualized. Pulmonary vasculature is indistinct with mild left basilar opacity. There is no visible pneumothorax or pleural effusion.  ??IMPRESSION:??  Mild pulmonary edema pattern. Stable cardiomegaly.      CT Abd 05/08/14  Chest:  1. Left upper lobe airspace disease suspicious for pneumonia. There is a tiny left pleural effusion.  2. The dependent airspace disease may represent additional pneumonia or atelectasis.  3. The significance and etiology of the right hilar lymph node enlargement is unclear, possibly reactive.  4. Marked cardiomegaly . The enlarged main pulmonary artery suggest pulmonary hypertension.  ??Abdomen pelvis:  1. Limited study with no source of sepsis.  2. The enlarged uterus is nonspecific but likely due to fibroids.  3. The left kidney is mildly atrophic with a small hyperdense lesion, nonspecific.    abd [place] 05/05/14  Abdomen:  Medical renal disease and renal disease.  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 ????  Liver duplex:  Pulsatility in the portal vein and reversal of flow in the in the hepatic veins, indicating hepatovenous reflux and right heart dysfunction.    TTE 05/04/14  Study Conclusions    - Left ventricle: The cavity size was severely dilated. Wall  ????thickness was increased in a pattern of mild LVH. Systolic  ????function was severely reduced. The estimated ejection fraction was  ????in the range of 15% to 20%. Diffuse hypokinesis with regional  ????variations.  - Mitral valve: Severe regurgitation directed eccentrically and  ????posteriorly.  - Left atrium: The atrium was severely dilated.  - Right ventricle: The cavity size was moderately to severely  ????dilated. Wall thickness was normal. There is echocardiographic  ????evidence of pulsus alternans in the RVOT suggesting right  ????ventricualr dysfunction. Systolic function was moderately to  ????severely reduced by objective interpretation. TAPSE: 1.2cm.  - Atrial septum: The septum bowed from left to right, consistent  ????with increased left atrial pressure.  - Pulmonary arteries: Systolic pressure was moderately increased, =  ????56mm Hg assuming that the right atrial pressure was 20 mmHg.  - Pericardium, extracardiac: A small, free-flowing pericardial  ????effusion was identified along the right ventricular free wall. The  ????fluid had no internal echoes.There was no evidence of hemodynamic  ????  compromise.      Consulting Services (include reason)     1. Cardiology service for end stage heart failure  2. Renal service for AKI  3. GI for liver failure    Allergies          Discharge Medications     There are no discharge medications for this patient.      Reason for Admission   Patient is a 64 y.o. 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 The patient was admitted on January 1st for nausea, vomiting and  altered mental status. The patient apparently had asterixis although normal ammonia, worsening INR and bilirubinemia. The patient also had a AKI on CKD with initial Scr 2.78 and BUN of 57 which increased to 4.7 and 93 respectively. Acetaminophen levels were normal. The treating team there was concerned for possible fulminant hepatitis. [facility] GI team was contacted and patient was transferred for further evaluation. The patient also had NSTEMI with initial troponin of 2.    The patient was given kayexalate, sodium bicarb, insulin and calcium gluconate for the hyperkalemia at [facility]. The patient had progressing encephalopathy and tachypnea and was intubated to protect her airway.?? Nephrology did evaluate the patient. 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 However, it does not appear the patient was ever adequately challenged with diuresis or albumin. The patient did receive fluids while inpatient.     The patient was eventually managed in the ICU, intubated and transferred to [facility] for further evaluation. The patient had a CT head which was negative. CXR showed cardiomegaly. CT chest showed enlarged RA/RV and enlarged pulmonary vascular. NM-galbladder showed normal liver architecture with some decrease function and gallbladder ejection of 21%. US liver was normal but kidney's had increase echogenicity. TTE showed EF 15-20% with global hypokinesias and RVSP 60 mmHg.??     Upon arrival, the patient was intubated and sedated from [facility] for airway protection. Patient transferred to [facility] ICU from [facility] hospital because patient had concern for liver failure and hepato-renal syndrome and work up+management for liver failure.     Hospital Course   Patient arrived on 05/04/14 intubated and sedated from [facility]. Patient had significant altered mental status with high ammonia prior to arrival to [facility], UDS negative, CT head negative. Thyroid studies wnl.     Regarding altered mental status, most  likely 2/2 metabolic encephalopathy, most likely from uremia and poor heart function. Despite improvement in her volume status, patient continued to be delirious through admission. On 05/06/14, it was deemed that patient was able to protect her airway with a good gag reflex and patient extubated and requiring only 3L nasal cannula to keep up oxygen saturation. With stable respiratory status, patient was able to go out of the ICU to medicine floor with tele; however, patient had persistent nonsustained VTach episodes due to her severe heart failure and transferred back to MICU on 05/08/14.    Liver failure found to be likely due to complication of patient's significant heart failure: with poor heart function, patient had right heart catherization 05/04/14 which showed PCWP of 25. For patient's acute decompensated heart failure, complicated to congestive hepatopathy (abdominal US normal, INR improved to 1.4) and and demand ischemia and NSTEMI II.  Patient was started on lasix drip. With lasix drip on board, patient had good urine output and improved liver function panel with ammonia  that came down to within normal limits. Of note, TTE 05/04/14 showed 15-20%EF with severe MR and severe TR. Regarding NSTEMI Type II: troponins that trended down, no change in hemodynamics and no change in oxygen requirement. Patient not started on heparin gtt given the fact that patient had decompensated heart failure, on top of worsening renal failure and NSTEMI likely due to demand (troponins peaked at 3.16). Although patient does have history of CAD, and nonspecific STT changes, with significant renal failure, patient poor candidate for LHC.     With improvement in volume status, patient's AKI (on admission with creatinine of 4.23 with BUN ~90s) also improved with creatinine down to 2.13 on 05/07/14. SWAN discontinued with patient's wedge pressures improved after volume off. Patient's weight = 127 on 05/06/14. After improvement in patient's renal  function 05/06/14, patient's creatinine started to rise again and urine output started to decrease. Patient given some volume (slowly given patient's severe heart failure), without significant improvement in renal function.     Given patient's significantly poor heart function with non-operable severe mitral regurgitation and severe tricuspid regurgitation, goals of care were re-addressed with patient's POA (son, Redell Bhandari [phone]). It was relayed to Barbara Cower that the patient's heart is in a state that cannot be salvaged. Patient's kidneys worsening and would likely require dialysis, but with patient's poor heart, it was unlikely that patient's renal function would ever recover. Given all the aforementioned information, patient's son decided that he would like to move forward with comfort care measures and hospice. Patient made [facility] code status.    Hospice was contacted 03/10/15 and discharged 03/11/15 to [facility] [place]. Regarding discharge medications: Hospice nursing informed me that hospice has own medications and so patient discharged without any medication prescriptions. Of note: Patient will likely need medications for anxiety and agitation more than anything.       Condition on Discharge     1. Functional Status: severely impaired, significant heart failure with poor prognosis.     2. Mental Status: Confused, intermittently combative   Describe limitations, if any: metabolic encephalopathy    3. Diet / Tube Feeding / TPN:  Diet Orders          FEEDING TUBE WATER BOLUS starting at 02/10 0800    Diet dysphagia 1 Nectar Thick starting at 02/06 1110    Continuous tube feedings starting at 02/04 1156          4. Respiratory / Lines & Tubes / Wounds:  RESPIRATORY:  Oxygen per nasal canula/tracheostomy at 3-4L/min. continuous.  BLADDER:  Follow nursing protocol for recent foley removal.  ACTIVITY: Activity as tolerated    5. Discharge Physical Exam:  BP 91/50 mmHg   Pulse 103   Temp(Src) 98 ??F (36.7 ??C) (Oral)    Resp 38   Ht 5' 4 (1.626 m)   Wt 120 lb 5.9 oz (54.6 kg)   BMI 20.65 kg/m2   SpO2 99%   General: nasal cannula, not consistently opening eyes to verbal stimuli, not following commands consistently  HEENT: atraumatic, PERRL, mild icterus,?? no jvd  Neck: no thyromegaly, no lympadenopathy, neck supple and symmetrical  Cardiac: RRR, no murmurs, no rubs, no gallops  Pulm: aerating well - on nasal cannula. with good air sounds, no crackles, no wheezes, no rhonchi  Abd: normal bowel sounds, soft, non tender to palpation, no organomegaly or masses  Extremities: atraumatic, no cyanosis, no LE edema, without any signs of infection  Neuro: moving all extremities without difficulty, grossly intact  Core Measure Documentation (As Applicable)     Most Recent Weights:   Wt Readings from Last 3 Encounters:   05/11/14 120 lb 5.9 oz (54.6 kg)       Disposition     Hospice      Follow-Up Appointments     No future appointments.      Thank you for allowing me to be a part this patient's care.   SignedBonnita Nasuti, MD   05/11/2014, 1:22 PM

## 2014-05-11 NOTE — Unmapped (Signed)
Renal Consult Follow Up Note      Chief complaint on admission : concern for hepatic failure/AKI    Reason for Renal Consult : AKI       HPI:  Gabrielle Rivera is a 64 y.o. 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 As transfer from [facility] for continued evaluation of AMS/renal failure/liver dysfunction?? for whom we are consulted for AKI.    Patient recently diagnosed with uterine fibroid and was taking Tylenol for pain although levels were not largely elevated on admission to [facility].    Patient presented 05/02/14 to [facility] with N/V/abd pain noted to have elevated INR 1.5 and elevated T bili to 1.10. BUN/Cr on admission was 59/2.78 with baseline ~2.0.?? U/S Abd showed diffuse gallbladder thickening which couple imply cholecystitis without stones found.?? 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 Patient was taking Lasix 40 mg BID / Lisinopril 2.5 mg which were held on admission due to AKI; light hydration NS @ 50 ml/hr per chart.    Patient became altered and started having hallucinations and was sedated/intubated for imaging of head. CT head without acute findings.    Contrast exposure:?? None  Nephrotoxic drug exposure: None  Hypotensive episodes: None    Past medical, family, and social histories were reviewed as previously documented. Updates were made as necessary.    Interval History:   Patient remains hypernatremic.     HISTORIES.    PFSH unchanged    ROS.  Confused cannot be obtained      PHYSICAL EXAM    Temp:  [97.6 ??F (36.4 ??C)-98.4 ??F (36.9 ??C)] 98 ??F (36.7 ??C)  Heart Rate:  [88-118] 103  Resp:  [16-44] 38  Arterial Line BP: (76-123)/(42-73) 118/73 mmHg    Wt Readings from Last 3 Encounters:   05/11/14 120 lb 5.9 oz (54.6 kg)         Intake/Output Summary (Last 24 hours) at 05/11/14 1316  Last data filed at 05/11/14 1000    Gross per 24 hour   Intake   3686 ml   Output    905 ml   Net   2781 ml       Constitutional :AMS  Skin :   No rash, lesions, warm to touch  HENT:  Normocephalic, external ears and nose normal  Eyes :   no injection, icterus, EOM grossly intact.  Neck :   supple, No tracheal shift, no Thy mass  Chest :  No respiratory distress, On ventilator.     clear to auscultation, BS vesicular +0  CVS :   HR regular, HS S1 S2 + 0    LE edema neg  ABD :   non-distended, soft, non-tender,   Psych :  AMS      No Known Allergies    MEDS     Scheduled Meds:  ??? senna-docusate  1 tablet Oral BID     Continuous Infusions:     PRN Meds:.haloperidol lactate, ondansetron      DATA :       Date 05/10/14 0700 - 05/11/14 0659 05/11/14 0700 - 05/12/14 0659   Shift 0700-1459 1500-2259 2300-0659 24 Hour Total 0700-1459 1500-2259 2300-0659 24 Hour Total   I  N  T  A  K  E   I.V.  (mL/kg)  2017  (36.9) 2017  (36.9)          I.V.   2017 2017        NG/GT  90 1559 1649 20   20      Flushes (mL) (Feeding Tube Nasogastric Right)  30 20 50 20   20      Water Bolus (mL) (Feeding Tube Nasogastric Right)   775 775          Intake (mL) (Feeding Tube Nasogastric Right)  60 764 824        Shift Total  (mL/kg)  90  (1.6) 3576  (65.5) 3666  (67.1) 20  (0.4)   20  (0.4)   O  U  T  P  U  T   Urine  (mL/kg/hr) 180  (0.4) 225  (0.5) 450  (1) 855  (0.7) 200   200      Output (mL) (Urethral Catheter Latex;Double-lumen) 180 225 450 855 200   200    Emesis/NG output  0 0 0          Residual (mL) (Feeding Tube Nasogastric Right)  0 0 0        Stool              Stool Occurrence  1 x 1 x 2 x 2 x   2 x    Shift Total  (mL/kg) 180  (3.1) 225  (3.9) 450  (8.2) 855  (15.7) 200  (3.7)   200  (3.7)   Weight (kg) 57.4 57.4 54.6 54.6 54.6 54.6 54.6 54.6       Lab Results   Component Value Date    CREATININE 5.16* 05/11/2014    BUN 103* 05/11/2014    NA 151* 05/11/2014    K 3.9 05/11/2014    CL 114* 05/11/2014    CO2 24 05/11/2014     Lab Results   Component Value Date     CALCIUM 8.6 05/11/2014    PHOS 2.5 05/11/2014     No results found for: [facility]  Lab Results   Component Value Date    WBC 8.9 05/11/2014    HGB 8.1* 05/11/2014    HCT 26.3* 05/11/2014    MCV 80.3 05/11/2014    PLT 187 05/11/2014     Lab Results   Component Value Date    IRON 17* 05/04/2014    TIBC 377 05/04/2014    FERRITIN 42.4 05/04/2014     Lab Results   Component Value Date    KETONESU Negative 05/08/2014    BLOODU Large* 05/08/2014    BILIRUBINUR Negative 05/08/2014    UROBILINOGEN <2.0 05/08/2014    PROTEINUA 100* 05/08/2014    NITRITE Negative 05/08/2014    LEUKOCYTESUR Moderate* 05/08/2014    PHUR 6.0 05/08/2014    LABSPEC 1.017 05/08/2014    CLARITYU Cloudy* 05/08/2014    COLORU Amber* 05/08/2014           In addition to the above, I have reviewed an extensive amount of complex data.    Morbidity / complication risk is felt to be :  high.      Assessment/Plan:   Gabrielle Rivera is a 64 y.o. 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 As transfer from [facility] for continued evaluation of AMS/renal failure/liver dysfunction?? for whom we are consulted for AKI.    #.) Anuric AKI on  CKD  - Baseline SCr 2.5 likely 2/2 poor perfusion CHF/HTN; R kidney ~ 9 cm at OSH suggesting chronic disease  - Unclear proteinuria prior to AKI; no records in system  - Suspect AKI etiology pre-renal to ATN on ACE vs. CRS vs. HRS  - UNa at OSH 28; suggesting pre-renal picture but unlikely HRS especially given liver not acutely decompensated  - Insults include: Lisinopril 2.5 mg at home stopped at OSH. Depressed cardiac function.   - Renal function was initially improving with Cr back to baseline and UO adequate without diuretics. Patient however had an NSTEMI with tachyarrhythmias and increased oxygen requirement. Transferred back to MICU. Cr has bumped since then. UO has improved with IV hydration. Patient continues to be hypernatremic      Plan:    - Primary team discussed goals of care with family. Plan is to discharge patient with hospice with primary goal being comfort.    Thank you for the consult. Nephrology will sign off now.      [facility]  05/11/2014    Discussed with Consult staff Dr Olga Millers

## 2014-05-11 NOTE — Unmapped (Signed)
Pt remains in BSWR per valid MD order.  Pt  is unable to maintain a safe environment.  Attempts to climb out of bed often.  Will continue to assess for d/c criteria.  All policies and procedures followed.  Will continue to monitor pt closely and assess skin integrity.

## 2014-05-11 NOTE — Unmapped (Signed)
Goals of care conversation    Talked to patient's son (POA and next of kin) regarding goals of care after patient met with hospice nurse.   (of note, to hospice nurse, patient mentioned that he was not yet ready to go to hospice).     Patient's son stated he had met with his mother after discussion with hospice nurse and felt that he was ready for her to go to hospice now. I explained to him that this means that her goals of care would now optimize comfort. This would mean that in the case of an emergency, respiratory or cardiac arrest, patient would not get intubated nor would have any chest compressions as that would counter goals of comfort. He understood and agreed with this.     Patient then talked to Cardiology team who discussed the patient's progressed heart disease. Sincerely appreciate help from Cardiology team in caring for Ms. Lehan.     Patient signed [facility] paperwork and talked to hospice nurse again. Orders changed in computer for goals of comfort.    I called [employer] to send a representative to turn off patient's ICD. Awaiting call back. If needed, we have magnet available to turn off ICD function.     Signed,   Bonnita Nasuti, MD

## 2014-05-11 NOTE — Unmapped (Signed)
ICU ATTENDING PROGRESS NOTE    I have examined Gabrielle Rivera, reviewed the events of the previous 24 hours, reviewed, confirmed and amended the resident's data, history and physical exam as needed. The case has been discussed with the MICU team. I note the following in my assessment and will implement this plan of coordinated critical care management as follows:    I note the following: pt is a 64 yo admitted HD 6, with heart failure and severe valve disease --not surgically amendable with altered mental status acute hepatic congestion and AKI    HD#8  Family meeting given overall poor prognosis --meeting with hospice today is more than appropriate   Will only be able to adjust physiologically so much given her end stage disease  Plans for [facility]-- will d/c to [facility]  Cards spoke with family as well-- appreciate their assistance       RESPIRATORY:  Hypoxia     CARDIOVASCULAR:  Acidosis  Cardiomyopathy  Coronary artery disease  Hyperlipidemia  Hypertension, unspecified  Ventricular Tachycardia, Paroxysmal  Volume depletion  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     NUTRITION:  Malnutrition, moderate (albumin 2.4-3.0)     GASTROINTESTINAL:  Acute hepatic failure     FLUIDS / ELECTROLYTES:  Dehydration     RENAL:  AKI   CKD      HEMATOLOGIC:   No acute issues     ENDOCRINE:   No acute issues     INFECTIOUS DISEASE:   Bacterial pneumonia      NEUROLOGIC:  Altered Mental Status      PSYCHIATRIC, PAIN, SEDATION:  No acute issues     INJURY / DISEASE SPECIFIC NEEDS:  No acute issues   GI Prophylaxis: Yes  DVT Prophylaxis: Yes    ACTIVE LINES;   Patient Lines/Drains/Airways Status    Active Epidural Line / PICC Line / PIV Line / ART Line / Line / CVC Line     Name:   Placement date:   Placement time:   Site:   Days:    CVC Triple Lumen 05/08/14 Left Femoral  05/08/14   2130   Femoral   2              ACTIVE DRAINS:  Foley    DISPOSITION:   Discharge to another facility    Total critical care time spent caring for this patient over  the past 24 hours: 35 minutes    Carless Slatten S HEBBELER-CLARK  05/11/2014  1:15 PM

## 2014-05-11 NOTE — Unmapped (Signed)
[place]    Social Worker Discharge Summary     Patient name: Gabrielle Rivera                                        Patient MRN: [id number]  DOB: 02/26/1951                              Age: 64 y.o.              Gender: female  Patient emergency contact: Extended Emergency Contact Information  Primary Emergency Contact: Valentino,Jason   [place]  Home Phone: [phone]  Relation: Son      Attending provider: Daiva Huge, MD  Primary care physician: Kristian Covey    The MD has indicated that the patient is ready for discharge.  Gordon Carlson was referred and accepted at Facility Name[facility]  Number to Call Report: n/a.  The patient will be transported by Name of Transport Service: [facility] set up transport   at [employer] of Transport: 2:45pm    Health visitor of Care: Theatre stage manager (BLS)    PASARR/HENS 7000 Completed: N/A    DC Summary and COC have been faxed to facility.     The plan has been reviewed:     Patient/Family Informed of Discharge Plan: Yes    Plan Reviewed With Patient, Family, or Significant Other: Yes    Patient and or family are aware and in agreement with the discharge plan: Yes    Family Member Name and Relationship Notified at Discharge: [name] (son)   Family Contact Number: [phone]    Plan reviewed with MD and other members of the health care team: Yes  Care Plan Completed: Yes    No further SW needs.

## 2014-05-11 NOTE — Unmapped (Signed)
Cardiology note:    Primary team talked with Patient's son about gaol of care. The plan per primary team is now comfort care and transition to hospice now.   As part of her new gaol of care would call [employer] to deactivate the ICD before discharging patient.    Cardiology team will sign off.  Please call with questions    Pt was seen and discussed with Dr. Christella Scheuermann, MD  Cardiology fellow

## 2014-05-11 NOTE — Unmapped (Signed)
Problem: Compromised Skin Integrity  Use this problem when pressure ulcers are classified as stage I or II.   Goal: Skin integrity is maintained or improved  Assess and monitor skin integrity. Identify patients at risk for skin breakdown on admission and per policy. Collaborate with interdisciplinary team and initiate plans and interventions as needed.   Outcome: Not Progressing  Pt is being turned Q2 hours.Pt has minimal layers underneath to avoid friction and sheer injuries. Pts skin is kept clean and dry. Will continue to assess and monitor

## 2014-06-02 ENCOUNTER — Other Ambulatory Visit: Payer: Self-pay | Admitting: *Deleted

## 2014-06-02 DIAGNOSIS — R002 Palpitations: Secondary | ICD-10-CM

## 2014-06-07 ENCOUNTER — Encounter: Payer: Self-pay | Admitting: Cardiovascular Disease

## 2014-06-07 ENCOUNTER — Ambulatory Visit (INDEPENDENT_AMBULATORY_CARE_PROVIDER_SITE_OTHER): Payer: BC Managed Care – PPO | Admitting: Cardiovascular Disease

## 2014-06-07 VITALS — BP 148/98 | HR 68 | Ht 62.0 in | Wt 144.0 lb

## 2014-06-07 DIAGNOSIS — I9589 Other hypotension: Secondary | ICD-10-CM

## 2014-06-07 DIAGNOSIS — I071 Rheumatic tricuspid insufficiency: Secondary | ICD-10-CM

## 2014-06-07 DIAGNOSIS — I779 Disorder of arteries and arterioles, unspecified: Secondary | ICD-10-CM

## 2014-06-07 DIAGNOSIS — I447 Left bundle-branch block, unspecified: Secondary | ICD-10-CM

## 2014-06-07 DIAGNOSIS — R002 Palpitations: Secondary | ICD-10-CM

## 2014-06-07 DIAGNOSIS — I739 Peripheral vascular disease, unspecified: Secondary | ICD-10-CM

## 2014-06-07 DIAGNOSIS — I10 Essential (primary) hypertension: Secondary | ICD-10-CM

## 2014-06-07 MED ORDER — LISINOPRIL 5 MG PO TABS: ORAL_TABLET | ORAL | Status: DC

## 2014-06-07 NOTE — Patient Instructions (Signed)
Continue all current medications. Your physician wants you to follow up in: 6 months.  You will receive a reminder letter in the mail one-two months in advance.  If you don't receive a letter, please call our office to schedule the follow up appointment   

## 2014-06-07 NOTE — Progress Notes (Signed)
[name], female   DOB: 1950/06/04, 64 y.o.   MRN: [id number]      SUBJECTIVE: The patient returns after undergoing 30 day event monitoring for palpitations. This demonstrated predominantly sinus rhythm with very few PVCs. Symptoms correlated with sinus rhythm. She has been checking her blood pressure regularly and she has had to take lisinopril on most days of the week for blood pressures greater than 140/90. She has had a few occasions where the blood pressure has dropped significantly and she felt fatigued.   Review of Systems: As per "subjective", otherwise negative.  Allergies  Allergen Reactions  . Cefzil [Cefprozil] Other (See Comments)    angioedema    Current Outpatient Prescriptions  Medication Sig Dispense Refill  . aspirin 81 MG tablet Take 81 mg by mouth daily.    . Calcium Carbonate-Vitamin D (CALTRATE 600+D) 600-400 MG-UNIT per tablet Take 1 tablet by mouth daily.    . cetirizine (ZYRTEC) 10 MG tablet Take 10 mg by mouth daily.    . chlorpheniramine-HYDROcodone (TUSSIONEX) 10-8 MG/5ML LQCR Take 5 mLs by mouth 2 (two) times daily.   0  . Coenzyme Q10 (COQ-10) 100 MG CAPS Take 1 capsule by mouth daily.    . fluticasone (FLONASE) 50 MCG/ACT nasal spray Place 2 sprays into both nostrils daily.    . hydrochlorothiazide (MICROZIDE) 12.5 MG capsule AS NEEDED FOR SWELLING 90 capsule 1  . Krill Oil 300 MG CAPS Take 1 capsule by mouth daily.    Marland Kitchen levofloxacin (LEVAQUIN) 500 MG tablet Take 500 mg by mouth daily.   0  . lisinopril (PRINIVIL,ZESTRIL) 5 MG tablet TAKE FOR BP GREATER THAN 140/90 90 tablet 1  . simvastatin (ZOCOR) 20 MG tablet Take 20 mg by mouth daily.     No current facility-administered medications for this visit.    Past Medical History  Diagnosis Date  . Other and unspecified hyperlipidemia   . Esophageal reflux   . Hypertension     Past Surgical History  Procedure Laterality Date  . Bladder repair    . Abdominal hysterectomy  08/2008      History   Social History  . Marital Status: Married    Spouse Name: N/A  . Number of Children: N/A  . Years of Education: N/A   Occupational History  . Not on file.   Social History Main Topics  . Smoking status: Never Smoker   . Smokeless tobacco: Never Used  . Alcohol Use: No  . Drug Use: No  . Sexual Activity: Not on file   Other Topics Concern  . Not on file   Social History Narrative     Filed Vitals:   06/07/14 1356  BP: 148/98  Pulse: 68  Height: 5\' 2"  (1.575 m)  Weight: 144 lb (65.318 kg)  SpO2: 97%    PHYSICAL EXAM General: NAD  Neck: No JVD, no thyromegaly or thyroid nodule.  Lungs: Clear to auscultation bilaterally with normal respiratory effort.  CV: Nondisplaced PMI. Heart regular S1/split S2, no S3/S4, II/VI holosystolic murmur at RUSB and along left sternal border. No peripheral edema. Varicose veins noted. Bilateral carotid bruits. Normal pedal pulses.  Abdomen: Soft, nontender, no hepatosplenomegaly, no distention.  Skin: Intact without lesions or rashes.  Neurologic: Alert and oriented x 3.  Psych: Normal affect.  Extremities: No clubbing  Skin: Normal. Musculoskeletal: Normal range of motion, no gross deformities. Extremities: No clubbing or cyanosis.  ECG: Most recent ECG reviewed.      ASSESSMENT AND  PLAN: 1. Left bundle branch block with chest pain: No further recurrences. Normal nuclear stress test. No further testing is indicated.  2. Tricuspid regurgitation: Only mild with normal pulmonary pressures.  3. Carotid artery disease: Bilateral carotid Dopplers demonstrated mild disease bilaterally. Will repeat in 2 years. 4. Palpitations: 30-day event monitor results noted above. No changes to management. 5. Essential HTN: Given BP fluctuations, I have instructed her to check her BP every morning and evening, and to only take lisinopril 5 mg if BP >/= 150/100. I will only have her take HCTZ 12.5 mg as needed for  significant ankle/leg swelling, but would prefer the use of compression stockings so as to avoid intravascular volume depletion.  Dispo: f/u 6 months.   Kate Sable, M.D., F.A.C.C.

## 2014-10-21 ENCOUNTER — Other Ambulatory Visit: Payer: Self-pay | Admitting: Obstetrics and Gynecology

## 2014-10-25 LAB — CYTOLOGY - PAP

## 2014-12-23 ENCOUNTER — Encounter: Payer: Self-pay | Admitting: *Deleted

## 2014-12-27 ENCOUNTER — Encounter: Payer: Self-pay | Admitting: Cardiovascular Disease

## 2014-12-27 ENCOUNTER — Ambulatory Visit (INDEPENDENT_AMBULATORY_CARE_PROVIDER_SITE_OTHER): Payer: BC Managed Care – PPO | Admitting: Cardiovascular Disease

## 2014-12-27 VITALS — BP 146/76 | HR 62 | Ht 62.0 in | Wt 142.0 lb

## 2014-12-27 DIAGNOSIS — I779 Disorder of arteries and arterioles, unspecified: Secondary | ICD-10-CM

## 2014-12-27 DIAGNOSIS — I071 Rheumatic tricuspid insufficiency: Secondary | ICD-10-CM

## 2014-12-27 DIAGNOSIS — I447 Left bundle-branch block, unspecified: Secondary | ICD-10-CM

## 2014-12-27 DIAGNOSIS — I10 Essential (primary) hypertension: Secondary | ICD-10-CM | POA: Diagnosis not present

## 2014-12-27 DIAGNOSIS — I739 Peripheral vascular disease, unspecified: Secondary | ICD-10-CM

## 2014-12-27 MED ORDER — LISINOPRIL 5 MG PO TABS: 5.0000 mg | ORAL_TABLET | Freq: Every day | ORAL | Status: DC

## 2014-12-27 NOTE — Patient Instructions (Addendum)
   Resume Lisinopril at 5mg  daily at 3:00 pm - new sent to pharmacy today for 90 day supply -  [facility] Continue all other medications.   Your physician wants you to follow up in: 6 months.  You will receive a reminder letter in the mail one-two months in advance.  If you don't receive a letter, please call our office to schedule the follow up appointment  Your physician has requested that you have a carotid duplex. This test is an ultrasound of the carotid arteries in your neck. It looks at blood flow through these arteries that supply the brain with blood. Allow one hour for this exam. There are no restrictions or special instructions. -  DUE JUST PRIOR TO YOUR 6 MONTH VISIT.

## 2014-12-27 NOTE — Progress Notes (Signed)
[name], female   DOB: 03-01-1951, 64 y.o.   MRN: [id number]      SUBJECTIVE: The patient returns for follow-up of palpitations, LBBB, and hypertension. She is here with her husband and they have several questions with regards to her health status. Specifically, she has recorded her blood pressure on a daily basis and has noticed that it is consistently over 637 mmHg systolic every evening. Her blood pressures have not been as low in the morning but she does feel weak and fatigued when her systolic readings are in the 110 range.   She denies exertional chest pain and she no longer has palpitations. While she does feel more fatigued after significant exertion this has not progressed in the past several months.   She has not engaged in routine exercise. 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 She has questions about her left bundle branch block and mild tricuspid regurgitation, as well as her bilateral carotid artery disease.  She is quite anxious. She has been looking up all of her conditions on the internet and thinks her "heart is in bad shape".   Review of Systems: As per "subjective", otherwise negative.  Allergies  Allergen Reactions  . Cefzil [Cefprozil] Other (See Comments)    angioedema    Current Outpatient Prescriptions  Medication Sig Dispense Refill  . aspirin 81 MG tablet Take 81 mg by mouth daily.    . Calcium Carbonate-Vitamin D (CALTRATE 600+D) 600-400 MG-UNIT per tablet Take 1 tablet by mouth daily.    . cetirizine (ZYRTEC) 10 MG tablet Take 10 mg by mouth daily.    . Coenzyme Q10 (COQ-10) 100 MG CAPS Take 1 capsule by mouth daily.    . fluticasone (FLONASE) 50 MCG/ACT nasal spray Place 2 sprays into both nostrils daily.    . hydrochlorothiazide (MICROZIDE) 12.5 MG capsule AS NEEDED FOR SWELLING 90 capsule 1  . Krill Oil 300 MG CAPS Take 1 capsule by mouth daily.    Marland Kitchen lisinopril  (PRINIVIL,ZESTRIL) 5 MG tablet TAKE FOR BP GREATER THAN 150/100    . Melatonin 5 MG TABS Take 1 tablet by mouth at bedtime.    . simvastatin (ZOCOR) 20 MG tablet Take 20 mg by mouth daily.     No current facility-administered medications for this visit.    Past Medical History  Diagnosis Date  . Other and unspecified hyperlipidemia   . Esophageal reflux   . Hypertension     Past Surgical History  Procedure Laterality Date  . Bladder repair    . Abdominal hysterectomy  08/2008    Social History   Social History  . Marital Status: Married    Spouse Name: N/A  . Number of Children: N/A  . Years of Education: N/A   Occupational History  . Not on file.   Social History Main Topics  . Smoking status: Never Smoker   . Smokeless tobacco: Never Used  . Alcohol Use: No  . Drug Use: No  . Sexual Activity: Not on file   Other Topics Concern  . Not on file   Social History Narrative     Filed Vitals:   12/27/14 1609  BP: 146/76  Pulse: 62  Height: 5\' 2"  (1.575 m)  Weight: 142 lb (64.411 kg)    PHYSICAL EXAM General: NAD  Neck: No JVD, no thyromegaly or thyroid nodule.  Lungs: Clear to auscultation bilaterally with  normal respiratory effort.  CV: Nondisplaced PMI. Heart regular S1/split S2, no S3/S4, II/VI holosystolic murmur at RUSB and along left sternal border. No peripheral edema. Varicose veins noted. Left carotid bruit. Normal pedal pulses.  Abdomen: Soft, nontender, no hepatosplenomegaly, no distention.  Skin: Intact without lesions or rashes.  Neurologic: Alert and oriented x 3.  Psych: Normal to slightly anxious affect.  Extremities: No clubbing  Skin: Normal.  Musculoskeletal: Normal range of motion, no gross deformities.  Extremities: No clubbing or cyanosis.  ECG: Most recent ECG reviewed.      ASSESSMENT AND PLAN: 1. Left bundle branch block: No recurrence of chest pain. Normal nuclear stress test in 07/2013. She has some level of  cardiopulmonary deconditioning, for which I have recommended a routine exercise program with 30 minutes of activity 4-5 days per week.   2. Carotid artery disease: Bilateral carotid Dopplers demonstrated mild disease bilaterally in 07/2013. Will repeat in 6 months.  3. Essential HTN: As BP is consistently elevated every evening, I have instructed her to take 5 mg of lisinopril every day at 3 PM. When her blood pressure is low in the morning, I have encouraged her to adequately hydrate herself and eat mildly salted foods, but not to do so in the afternoon so as to perpetuate her nocturnal high blood pressure.  4. Murmur: Due to mild tricuspid regurgitation which is benign, most recently assessed in 07/2013 by echocardiogram.  Dispo: f/u 6 months.  Time spent: 40 minutes, of which greater than 50% was spent reviewing symptoms, relevant blood tests and studies, and discussing management plan with the patient.  Kate Sable, M.D., F.A.C.C.

## 2015-01-20 ENCOUNTER — Ambulatory Visit (INDEPENDENT_AMBULATORY_CARE_PROVIDER_SITE_OTHER): Payer: BC Managed Care – PPO | Admitting: *Deleted

## 2015-01-20 VITALS — BP 138/66 | HR 62

## 2015-01-20 DIAGNOSIS — I10 Essential (primary) hypertension: Secondary | ICD-10-CM | POA: Diagnosis not present

## 2015-01-20 NOTE — Progress Notes (Signed)
Patient came by office requesting that we check BP to compare readings with our monitor & hers.  She does note that she does have palpitations occasionally.  Informed her that sometimes the monitors will have difficulty picking up reading due to that.  She also stated that she had not taken her Lisinopril the last 3 days due to her numbers being low.  Does feel fatigued & a little light headed during those times.  Stated she also is trying to watch her salt intake & staying hydrated.  I advised her to log her BP & HR over the next 2 weeks & document medication or not.  Readings will then be given to MD for review.  Patient verbalized understanding.

## 2015-04-05 ENCOUNTER — Other Ambulatory Visit: Payer: Self-pay | Admitting: Cardiovascular Disease

## 2015-04-05 DIAGNOSIS — I6523 Occlusion and stenosis of bilateral carotid arteries: Secondary | ICD-10-CM

## 2015-06-14 ENCOUNTER — Ambulatory Visit: Payer: BC Managed Care – PPO

## 2015-06-14 DIAGNOSIS — I6523 Occlusion and stenosis of bilateral carotid arteries: Secondary | ICD-10-CM | POA: Diagnosis not present

## 2015-06-22 ENCOUNTER — Ambulatory Visit (INDEPENDENT_AMBULATORY_CARE_PROVIDER_SITE_OTHER): Payer: BC Managed Care – PPO | Admitting: Cardiovascular Disease

## 2015-06-22 ENCOUNTER — Encounter: Payer: Self-pay | Admitting: Cardiovascular Disease

## 2015-06-22 VITALS — BP 122/81 | HR 57 | Ht 62.0 in | Wt 144.0 lb

## 2015-06-22 DIAGNOSIS — I779 Disorder of arteries and arterioles, unspecified: Secondary | ICD-10-CM

## 2015-06-22 DIAGNOSIS — I447 Left bundle-branch block, unspecified: Secondary | ICD-10-CM | POA: Diagnosis not present

## 2015-06-22 DIAGNOSIS — I071 Rheumatic tricuspid insufficiency: Secondary | ICD-10-CM | POA: Diagnosis not present

## 2015-06-22 DIAGNOSIS — I10 Essential (primary) hypertension: Secondary | ICD-10-CM

## 2015-06-22 DIAGNOSIS — I739 Peripheral vascular disease, unspecified: Secondary | ICD-10-CM

## 2015-06-22 MED ORDER — LISINOPRIL 5 MG PO TABS: ORAL_TABLET | ORAL | Status: DC

## 2015-06-22 NOTE — Progress Notes (Signed)
[name], female   DOB: 25-Apr-1950, 65 y.o.   MRN: [id number]      SUBJECTIVE: The patient returns for follow-up of left bundle branch block, palpitations, and hypertension. She is known to be quite anxious. Carotid Dopplers on 06/14/15 showed mild bilateral carotid artery stenosis of 1-39%. ECG today shows sinus bradycardia, heart rate 58 bpm, with left bundle branch block.  She has noticed that her blood pressure remains normal for one to two weeks and may even become low at times, but will then remain consistently elevated over [id number] systolic for a week. She asked whether she can take lisinopril on an as-needed basis. She has had her blood pressure cuff checked and it is functioning normally. On one occasion she briefly passed out in our parking lot when her blood pressure was low.  Review of Systems: As per "subjective", otherwise negative.  Allergies  Allergen Reactions  . Cefzil [Cefprozil] Other (See Comments)    angioedema    Current Outpatient Prescriptions  Medication Sig Dispense Refill  . aspirin 81 MG tablet Take 81 mg by mouth daily.    . Calcium Carbonate-Vitamin D (CALTRATE 600+D) 600-400 MG-UNIT per tablet Take 1 tablet by mouth daily.    . cetirizine (ZYRTEC) 10 MG tablet Take 10 mg by mouth daily.    . Coenzyme Q10 (COQ-10) 100 MG CAPS Take 1 capsule by mouth daily.    . fluticasone (FLONASE) 50 MCG/ACT nasal spray Place 2 sprays into both nostrils daily.    . hydrochlorothiazide (MICROZIDE) 12.5 MG capsule Take 12.5 mg by mouth as needed.    Javier Docker Oil 300 MG CAPS Take 1 capsule by mouth daily.    Marland Kitchen lisinopril (PRINIVIL,ZESTRIL) 5 MG tablet Take 1 tablet (5 mg total) by mouth daily. At 3:00 pm. 90 tablet 3  . Melatonin 5 MG TABS Take 1 tablet by mouth at bedtime.    . simvastatin (ZOCOR) 20 MG tablet Take 20 mg by mouth daily.     No current facility-administered medications for this visit.    Past Medical History  Diagnosis Date  . Other and  unspecified hyperlipidemia   . Esophageal reflux   . Hypertension     Past Surgical History  Procedure Laterality Date  . Bladder repair    . Abdominal hysterectomy  08/2008    Social History   Social History  . Marital Status: Married    Spouse Name: N/A  . Number of Children: N/A  . Years of Education: N/A   Occupational History  . Not on file.   Social History Main Topics  . Smoking status: Never Smoker   . Smokeless tobacco: Never Used  . Alcohol Use: No  . Drug Use: No  . Sexual Activity: Not on file   Other Topics Concern  . Not on file   Social History Narrative     Filed Vitals:   06/22/15 1115  BP: 122/81  Pulse: 57  Height: 5\' 2"  (1.575 m)  Weight: 144 lb (65.318 kg)    PHYSICAL EXAM General: NAD  Neck: No JVD, no thyromegaly or thyroid nodule.  Lungs: Clear to auscultation bilaterally with normal respiratory effort.  CV: Nondisplaced PMI. Heart regular S1/split S2, no S3/S4, II/VI holosystolic murmur at RUSB and along left sternal border. No peripheral edema.   Abdomen: Soft, nontender, no distention.  Skin: Intact without lesions or rashes.  Neurologic: Alert and oriented x 3.  Psych: Normal to slightly anxious affect.  Extremities: No clubbing  ECG: Most recent ECG reviewed.      ASSESSMENT AND PLAN: 1. Left bundle branch block: Symptomatically stable. Normal nuclear stress test in 07/2013. She has some level of cardiopulmonary deconditioning, for which I previously recommended a routine exercise program with 30 minutes of activity 4-5 days per week.   2. Carotid artery disease: Bilateral carotid Dopplers demonstrated mild disease bilaterally on 3/15//2017. Can be repeated in 2-3 years. Continue ASA and statin.  3. Essential HTN: As blood pressure has consistently been fluctuating as noted above, I will have her take lisinopril 5 mg for SBP greater than or equal to 140.  4. Murmur: Due to mild tricuspid regurgitation which is  benign, most recently assessed in 07/2013 by echocardiogram.  Dispo: f/u 1 year.   Kate Sable, M.D., F.A.C.C.

## 2015-06-22 NOTE — Patient Instructions (Signed)
   Change your Lisinopril to as needed for systolic blood pressure (top number) greater than 140.   Continue all other medications.   Your physician wants you to follow up in:  1 year.  You will receive a reminder letter in the mail one-two months in advance.  If you don't receive a letter, please call our office to schedule the follow up appointment

## 2016-06-21 ENCOUNTER — Encounter: Payer: Self-pay | Admitting: *Deleted

## 2016-06-24 ENCOUNTER — Encounter: Payer: Self-pay | Admitting: *Deleted

## 2016-06-24 ENCOUNTER — Ambulatory Visit (INDEPENDENT_AMBULATORY_CARE_PROVIDER_SITE_OTHER): Payer: BC Managed Care – PPO | Admitting: Cardiovascular Disease

## 2016-06-24 ENCOUNTER — Telehealth: Payer: Self-pay | Admitting: Cardiovascular Disease

## 2016-06-24 ENCOUNTER — Encounter: Payer: Self-pay | Admitting: Cardiovascular Disease

## 2016-06-24 VITALS — BP 122/70 | HR 60 | Ht 62.0 in | Wt 141.4 lb

## 2016-06-24 DIAGNOSIS — I071 Rheumatic tricuspid insufficiency: Secondary | ICD-10-CM

## 2016-06-24 DIAGNOSIS — R0609 Other forms of dyspnea: Secondary | ICD-10-CM

## 2016-06-24 DIAGNOSIS — I447 Left bundle-branch block, unspecified: Secondary | ICD-10-CM

## 2016-06-24 DIAGNOSIS — I739 Peripheral vascular disease, unspecified: Secondary | ICD-10-CM

## 2016-06-24 DIAGNOSIS — I779 Disorder of arteries and arterioles, unspecified: Secondary | ICD-10-CM | POA: Diagnosis not present

## 2016-06-24 DIAGNOSIS — I10 Essential (primary) hypertension: Secondary | ICD-10-CM

## 2016-06-24 DIAGNOSIS — R0600 Dyspnea, unspecified: Secondary | ICD-10-CM

## 2016-06-24 MED ORDER — LISINOPRIL 5 MG PO TABS: ORAL_TABLET | ORAL | 1 refills | Status: DC

## 2016-06-24 NOTE — Progress Notes (Signed)
SUBJECTIVE: The patient returns for follow-up of left bundle branch block, palpitations, and hypertension. She is known to be quite anxious. Carotid Dopplers on 06/14/15 showed mild bilateral carotid artery stenosis of 1-39%.  She denies exertional chest pain. She does experience exertional dyspnea when vacuuming or walking on the treadmill for 15 minutes. She said she is not nearly as active as she used to be since she retired. She wonders if she is out of shape or if there is something else going on.  Systolic blood pressures quite frequently in the evening are 140 or higher. She takes lisinopril/hydrochlorothiazide 10/12.5 mg every afternoon. She has varicose veins and bilateral ankle swelling.  Nuclear stress test in May 2015 did not show myocardial scar or ischemia.   Review of Systems: As per "subjective", otherwise negative.  Allergies  Allergen Reactions  . Cefzil [Cefprozil] Other (See Comments)    angioedema    Current Outpatient Prescriptions  Medication Sig Dispense Refill  . aspirin 81 MG tablet Take 81 mg by mouth daily.    . Calcium Carbonate-Vitamin D (CALTRATE 600+D) 600-400 MG-UNIT per tablet Take 1 tablet by mouth daily.    . cetirizine (ZYRTEC) 10 MG tablet Take 10 mg by mouth daily.    . Coenzyme Q10 (COQ-10) 100 MG CAPS Take 1 capsule by mouth daily.    . fluticasone (FLONASE) 50 MCG/ACT nasal spray Place 2 sprays into both nostrils daily.    Marland Kitchen lisinopril-hydrochlorothiazide (PRINZIDE,ZESTORETIC) 10-12.5 MG tablet Take 1 tablet by mouth daily.    Marland Kitchen LORazepam (ATIVAN) 1 MG tablet Take 1 mg by mouth daily as needed for anxiety.    . Melatonin 5 MG TABS Take 1 tablet by mouth at bedtime.    Marland Kitchen omega-3 acid ethyl esters (LOVAZA) 1 g capsule Take by mouth 2 (two) times daily.    . simvastatin (ZOCOR) 20 MG tablet Take 20 mg by mouth daily.    . vitamin B-12 (CYANOCOBALAMIN) 250 MCG tablet Take 250 mcg by mouth daily.     No current facility-administered  medications for this visit.     Past Medical History:  Diagnosis Date  . Esophageal reflux   . Hypertension   . Other and unspecified hyperlipidemia     Past Surgical History:  Procedure Laterality Date  . ABDOMINAL HYSTERECTOMY  08/2008  . BLADDER REPAIR      Social History   Social History  . Marital status: Married    Spouse name: N/A  . Number of children: N/A  . Years of education: N/A   Occupational History  . Not on file.   Social History Main Topics  . Smoking status: Never Smoker  . Smokeless tobacco: Never Used  . Alcohol use No  . Drug use: No  . Sexual activity: Not on file   Other Topics Concern  . Not on file   Social History Narrative  . No narrative on file     Vitals:   06/24/16 0821  BP: 122/70  Pulse: 60  SpO2: 100%  Weight: 141 lb 6.4 oz (64.1 kg)  Height: 5\' 2"  (1.575 m)    PHYSICAL EXAM General: NAD  Neck: No JVD, no thyromegaly or thyroid nodule.  Lungs: Clear to auscultation bilaterally with normal respiratory effort.  CV: Nondisplaced PMI. Heart regular S1/split S2, no S3/S4, II/VI holosystolic murmur at RUSB and along left sternal border. No peripheral edema.  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 No carotid bruits. Abdomen: Soft, nontender, no distention.  Skin: Intact without lesions or rashes.  Neurologic: Alert and oriented x 3.  Psych: Normal to slightly anxious affect.  Extremities: No clubbing     ECG: Most recent ECG reviewed.      ASSESSMENT AND PLAN: 1. Left bundle branch block with exertional dyspnea: Normal nuclear stress test in 07/2013. I will repeat a Lexiscan Myoview to see if there have been interval changes.  She has some level of cardiopulmonary deconditioning, for which I previously recommended a routine exercise program with 30 minutes of activity 4-5 days per week. If nuclear stress test is normal but symptoms progress, I have asked her to come and see me sooner and I will  arrange for coronary angiography.  2. Carotid artery disease: Bilateral carotid Dopplers demonstrated mild disease bilaterally on 3/15//2017. Can be repeated in 2 years. Continue ASA and statin.  3. Essential HTN: She takes lisinopril/HCTZ 10/12.5 mg every afternoon. Given elevated readings in the evening, I have asked her taken to extra 5 mg of lisinopril which I will prescribe.  4. Murmur: Due to mild tricuspid regurgitation which is benign, most recently assessed in 07/2013 by echocardiogram.  Dispo: f/u 1 year.   Kate Sable, M.D., F.A.C.C.

## 2016-06-24 NOTE — Telephone Encounter (Signed)
Lexiscan scheduled July 25, 2016 at [facility] (dyspnea on exertion) LBBB (left bundle branch block)

## 2016-06-24 NOTE — Patient Instructions (Signed)
Your physician wants you to follow-up in: [place] will receive a reminder letter in the mail two months in advance. If you don't receive a letter, please call our office to schedule the follow-up appointment.  Your physician has recommended you make the following change in your medication:   START LISINOPRIL 5 MG DAILY AS NEEDED FOR SYSTOLIC (TOP NUMBER) BLOOD PRESSURE OVER 140   Your physician has requested that you have a lexiscan myoview. For further information please visit [email or web address]. Please follow instruction sheet, as given.  Thank you for choosing [facility]!!

## 2016-07-09 ENCOUNTER — Telehealth: Payer: Self-pay | Admitting: Cardiovascular Disease

## 2016-07-09 NOTE — Telephone Encounter (Signed)
Pre-cert Verification for the following procedure    07/25/2016     : 9:30 AM Length:    NM MYO MULT SPECT W/WALL

## 2016-07-25 ENCOUNTER — Telehealth: Payer: Self-pay | Admitting: *Deleted

## 2016-07-25 ENCOUNTER — Encounter (HOSPITAL_COMMUNITY): Payer: Self-pay

## 2016-07-25 ENCOUNTER — Encounter (HOSPITAL_COMMUNITY)
Admission: RE | Admit: 2016-07-25 | Discharge: 2016-07-25 | Disposition: A | Discharge status: Home / Self Care | Payer: BC Managed Care – PPO | Source: Ambulatory Visit | Attending: Cardiovascular Disease | Admitting: Cardiovascular Disease

## 2016-07-25 ENCOUNTER — Encounter (HOSPITAL_BASED_OUTPATIENT_CLINIC_OR_DEPARTMENT_OTHER)
Admission: RE | Admit: 2016-07-25 | Discharge: 2016-07-25 | Disposition: A | Discharge status: Home / Self Care | Payer: BC Managed Care – PPO | Source: Ambulatory Visit | Attending: Cardiovascular Disease | Admitting: Cardiovascular Disease

## 2016-07-25 DIAGNOSIS — R0609 Other forms of dyspnea: Secondary | ICD-10-CM

## 2016-07-25 DIAGNOSIS — I447 Left bundle-branch block, unspecified: Secondary | ICD-10-CM

## 2016-07-25 DIAGNOSIS — R0600 Dyspnea, unspecified: Secondary | ICD-10-CM

## 2016-07-25 LAB — NM MYOCAR MULTI W/SPECT W/WALL MOTION / EF
LV dias vol: 48 mL (ref 46–106)
LV sys vol: 14 mL
Peak HR: 93 {beats}/min
RATE: 0.35
Rest HR: 60 {beats}/min
SDS: 1
SRS: 3
SSS: 4
TID: 1.12

## 2016-07-25 MED ORDER — REGADENOSON 0.4 MG/5ML IV SOLN
INTRAVENOUS | Status: AC
  Administered 2016-07-25: 0.4 mg via INTRAVENOUS
  Filled 2016-07-25: qty 5

## 2016-07-25 MED ORDER — TECHNETIUM TC 99M TETROFOSMIN IV KIT
10.0000 | PACK | Freq: Once | INTRAVENOUS | Status: AC | PRN
  Administered 2016-07-25: 10.9 via INTRAVENOUS

## 2016-07-25 MED ORDER — SODIUM CHLORIDE 0.9% FLUSH
INTRAVENOUS | Status: AC
  Administered 2016-07-25: 10 mL via INTRAVENOUS
  Filled 2016-07-25: qty 10

## 2016-07-25 MED ORDER — TECHNETIUM TC 99M TETROFOSMIN IV KIT
30.0000 | PACK | Freq: Once | INTRAVENOUS | Status: AC | PRN
Start: 2016-07-25 — End: 2016-07-25
  Administered 2016-07-25: 33 via INTRAVENOUS

## 2016-07-25 NOTE — Telephone Encounter (Signed)
Notes recorded by Laurine Blazer, LPN on 5/95/6387 at 5:64 PM EDT No answer.  ------  Notes recorded by Herminio Commons, MD on 07/25/2016 at 12:37 PM EDT Normal.

## 2016-07-30 NOTE — Telephone Encounter (Signed)
Notes recorded by Laurine Blazer, LPN on 06/07/6884 at 4:84 AM EDT Patient notified and verbalized understanding. Copy to pmd. ------

## 2017-06-27 ENCOUNTER — Encounter: Payer: Self-pay | Admitting: *Deleted

## 2017-06-30 ENCOUNTER — Telehealth: Payer: Self-pay | Admitting: Cardiovascular Disease

## 2017-06-30 ENCOUNTER — Ambulatory Visit: Payer: BC Managed Care – PPO | Admitting: Cardiovascular Disease

## 2017-06-30 ENCOUNTER — Encounter: Payer: Self-pay | Admitting: Cardiovascular Disease

## 2017-06-30 VITALS — BP 113/74 | HR 54 | Ht 62.0 in | Wt 138.6 lb

## 2017-06-30 DIAGNOSIS — R001 Bradycardia, unspecified: Secondary | ICD-10-CM | POA: Diagnosis not present

## 2017-06-30 DIAGNOSIS — I739 Peripheral vascular disease, unspecified: Secondary | ICD-10-CM

## 2017-06-30 DIAGNOSIS — R011 Cardiac murmur, unspecified: Secondary | ICD-10-CM

## 2017-06-30 DIAGNOSIS — I779 Disorder of arteries and arterioles, unspecified: Secondary | ICD-10-CM | POA: Diagnosis not present

## 2017-06-30 DIAGNOSIS — R0609 Other forms of dyspnea: Secondary | ICD-10-CM

## 2017-06-30 DIAGNOSIS — R5383 Other fatigue: Secondary | ICD-10-CM | POA: Diagnosis not present

## 2017-06-30 DIAGNOSIS — I10 Essential (primary) hypertension: Secondary | ICD-10-CM

## 2017-06-30 DIAGNOSIS — I447 Left bundle-branch block, unspecified: Secondary | ICD-10-CM | POA: Diagnosis not present

## 2017-06-30 DIAGNOSIS — R0600 Dyspnea, unspecified: Secondary | ICD-10-CM

## 2017-06-30 NOTE — Telephone Encounter (Signed)
Echo scheduled in [facility]  July 22, 2017

## 2017-06-30 NOTE — Progress Notes (Signed)
SUBJECTIVE: The patient returns for follow-up of left bundle branch block, palpitations, and hypertension. She is known to be quite anxious.  Nuclear stress test in May 2015 did not show myocardial scar or ischemia.  A follow-up nuclear stress test on 07/25/16 was low risk with no evidence of myocardial ischemia or scar, LVEF 71%.  Carotid Dopplers on 06/14/15 showed mild bilateral carotid artery stenosis of 1-39%.  ECG performed today demonstrates sinus bradycardia, 51 bpm, with left bundle branch block.  She continues to experience exertional dyspnea with routine activities such as raking leaves and vacuuming.  She did not feel like this a few years ago.  She said her symptoms overall have remained stable over the past year since I last saw her.  She has an occasional exertional lightheadedness and dizziness and when bending over and standing back up.  She believes her heart rate at home ranges from 50-60 bpm.        Review of Systems: As per "subjective", otherwise negative.  Allergies  Allergen Reactions  . Cefzil [Cefprozil] Other (See Comments)    angioedema    Current Outpatient Medications  Medication Sig Dispense Refill  . aspirin 81 MG tablet Take 81 mg by mouth daily.    . Calcium Carbonate-Vitamin D (CALTRATE 600+D) 600-400 MG-UNIT per tablet Take 1 tablet by mouth daily.    . cetirizine (ZYRTEC) 10 MG tablet Take 10 mg by mouth daily.    . Coenzyme Q10 (COQ-10) 100 MG CAPS Take 1 capsule by mouth daily.    . fluticasone (FLONASE) 50 MCG/ACT nasal spray Place 2 sprays into both nostrils daily.    Marland Kitchen lisinopril (PRINIVIL,ZESTRIL) 5 MG tablet TAKE 1 TAB DAILY AS NEEDED FOR SYSTOLIC BP OVER 683 90 tablet 1  . lisinopril-hydrochlorothiazide (PRINZIDE,ZESTORETIC) 10-12.5 MG tablet Take 1 tablet by mouth daily.    Marland Kitchen LORazepam (ATIVAN) 1 MG tablet Take 1 mg by mouth daily as needed for anxiety.    . Melatonin 5 MG TABS Take 1 tablet by mouth at bedtime.    Marland Kitchen omega-3  acid ethyl esters (LOVAZA) 1 g capsule Take by mouth 2 (two) times daily.    . simvastatin (ZOCOR) 20 MG tablet Take 20 mg by mouth daily.    . vitamin B-12 (CYANOCOBALAMIN) 250 MCG tablet Take 250 mcg by mouth daily.     No current facility-administered medications for this visit.     Past Medical History:  Diagnosis Date  . Esophageal reflux   . Hypertension   . Other and unspecified hyperlipidemia     Past Surgical History:  Procedure Laterality Date  . ABDOMINAL HYSTERECTOMY  08/2008  . BLADDER REPAIR      Social History   Socioeconomic History  . Marital status: Married    Spouse name: Not on file  . Number of children: Not on file  . Years of education: Not on file  . Highest education level: Not on file  Occupational History  . Not on file  Social Needs  . Financial resource strain: Not on file  . Food insecurity:    Worry: Not on file    Inability: Not on file  . Transportation needs:    Medical: Not on file    Non-medical: Not on file  Tobacco Use  . Smoking status: Never Smoker  . Smokeless tobacco: Never Used  Substance and Sexual Activity  . Alcohol use: No    Alcohol/week: 0.0 oz  . Drug use: No  .  Sexual activity: Not on file  Lifestyle  . Physical activity:    Days per week: Not on file    Minutes per session: Not on file  . Stress: Not on file  Relationships  . Social connections:    Talks on phone: Not on file    Gets together: Not on file    Attends religious service: Not on file    Active member of club or organization: Not on file    Attends meetings of clubs or organizations: Not on file    Relationship status: Not on file  . Intimate partner violence:    Fear of current or ex partner: Not on file    Emotionally abused: Not on file    Physically abused: Not on file    Forced sexual activity: Not on file  Other Topics Concern  . Not on file  Social History Narrative  . Not on file     Vitals:   06/30/17 1001  BP: 113/74    Pulse: (!) 54  Weight: 138 lb 9.6 oz (62.9 kg)  Height: 5\' 2"  (1.575 m)    Wt Readings from Last 3 Encounters:  06/30/17 138 lb 9.6 oz (62.9 kg)  06/24/16 141 lb 6.4 oz (64.1 kg)  06/22/15 144 lb (65.3 kg)     PHYSICAL EXAM General: NAD HEENT: Normal. Neck: No JVD, no thyromegaly. Lungs: Clear to auscultation bilaterally with normal respiratory effort. CV: Bradycardic, regular rhythm, normal S1/split S2, no S3/S4, II/VI holosystolic murmur at RUSB and along left sternal border. No peripheral edema. 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 No carotid bruits. Abdomen: Soft, nontender, no distention.  Neurologic: Alert and oriented.  Psych: Normal affect. Skin: Normal. Musculoskeletal: No gross deformities.    ECG: Most recent ECG reviewed.   Labs: Lab Results  Component Value Date/Time   K 3.9 09/12/2008 09:00 AM   BUN 9 09/12/2008 09:00 AM   CREATININE 0.71 09/12/2008 09:00 AM   HGB 9.3 (L) 09/14/2008 11:20 AM     Lipids: No results found for: LDLCALC, LDLDIRECT, CHOL, TRIG, HDL     ASSESSMENT AND PLAN:  1. Left bundle branch block with exertional dyspnea and bradycardia with fatigue: Normal nuclear stress test in 07/2013 and in April 2018.  I will obtain a 30-day event monitor to see if she is experiencing symptomatic bradycardia.  I will also obtain an echocardiogram to assess for valvular heart disease.  She may be experiencing some level of chronotropic incompetence but is reluctant to proceed with an exercise tolerance test as she thinks she would not be able to perform adequately. I would not rule out the possibility of coronary angiography.  2. Carotid artery disease: Bilateral carotid Dopplers demonstrated mild disease bilaterally on 3/15//2017. Can be repeated in 2 years.   3. Essential HTN:  Controlled on present therapy.  No changes.  4. Murmur with exertional dyspnea and fatigue: Due to mild tricuspid regurgitation which is benign, most  recently assessed in 07/2013 by echocardiogram.  See discussion in #1.  I will obtain a follow-up echocardiogram.     Disposition: Follow up 2 months    Kate Sable, M.D., F.A.C.C.

## 2017-06-30 NOTE — Patient Instructions (Signed)
Medication Instructions:  Your physician recommends that you continue on your current medications as directed. Please refer to the Current Medication list given to you today.  Labwork: NONE  Testing/Procedures: Your physician has requested that you have an echocardiogram. Echocardiography is a painless test that uses sound waves to create images of your heart. It provides your doctor with information about the size and shape of your heart and how well your heart's chambers and valves are working. This procedure takes approximately one hour. There are no restrictions for this procedure.  Your physician has recommended that you wear an event monitor FOR 30 DAYS. Event monitors are medical devices that record the heart's electrical activity. Doctors most often [place] these monitors to diagnose arrhythmias. Arrhythmias are problems with the speed or rhythm of the heartbeat. The monitor is a small, portable device. You can wear one while you do your normal daily activities. This is usually used to diagnose what is causing palpitations/syncope (passing out).   Follow-Up: Your physician recommends that you schedule a follow-up appointment in: [address]  Any Other Special Instructions Will Be Listed Below (If Applicable).  If you need a refill on your cardiac medications before your next appointment, please call your pharmacy.

## 2017-07-14 ENCOUNTER — Ambulatory Visit (INDEPENDENT_AMBULATORY_CARE_PROVIDER_SITE_OTHER): Payer: BC Managed Care – PPO

## 2017-07-14 DIAGNOSIS — R001 Bradycardia, unspecified: Secondary | ICD-10-CM

## 2017-07-22 ENCOUNTER — Other Ambulatory Visit: Payer: BC Managed Care – PPO | Admitting: Cardiovascular Disease

## 2017-08-06 ENCOUNTER — Telehealth: Payer: Self-pay

## 2017-08-06 NOTE — Telephone Encounter (Signed)
Received phone call from [employer] stating patient has pressed event button on monitor with option of passing out. Contact patient and patient states this was a mistake and she felt fine.

## 2017-08-07 ENCOUNTER — Other Ambulatory Visit: Payer: Self-pay

## 2017-08-07 ENCOUNTER — Telehealth: Payer: Self-pay

## 2017-08-07 ENCOUNTER — Ambulatory Visit (INDEPENDENT_AMBULATORY_CARE_PROVIDER_SITE_OTHER): Payer: BC Managed Care – PPO

## 2017-08-07 DIAGNOSIS — R011 Cardiac murmur, unspecified: Secondary | ICD-10-CM

## 2017-08-07 MED ORDER — LISINOPRIL 10 MG PO TABS: 10.0000 mg | ORAL_TABLET | Freq: Every day | ORAL | 0 refills | Status: DC

## 2017-08-07 NOTE — Telephone Encounter (Signed)
-----   Message from Herminio Commons, MD sent at 08/07/2017 12:21 PM EDT ----- Normal pumping function. Echo suggests she is volume depleted which could be contributing to shortness of breath as it relates to the heart. Have her stop HCTZ and begin drinking 6-8 bottles of water daily.

## 2017-08-07 NOTE — Telephone Encounter (Signed)
For now, take the same dose of lisinopril.

## 2017-08-07 NOTE — Telephone Encounter (Signed)
Patient notified and verbalized understanding. 

## 2017-08-07 NOTE — Telephone Encounter (Signed)
Patient notified. Routed to PCP. Patient would like to know what to do regarding lisinopril since she was taking prinzide? Will sent to provider

## 2017-08-12 ENCOUNTER — Telehealth: Payer: Self-pay | Admitting: Cardiovascular Disease

## 2017-08-12 MED ORDER — LISINOPRIL 10 MG PO TABS: 20.0000 mg | ORAL_TABLET | Freq: Two times a day (BID) | ORAL | Status: DC

## 2017-08-12 NOTE — Telephone Encounter (Signed)
5/10 - 6:03 pm - 123/67  58 5/11 - 9:50 pm - 163/63  58 5/12 - 10:00 pm - 168/72  59 5/12 - 6:01 pm - 113/66 69 5/13 - 138/61 am             157/70  - 1:49 pm              139/70 - 6:54 pm              154/66 9:05 pm  No other symptoms of chest pain, dizziness or SOB.  Stated that she has been taking the Lisinopril 10mg  as needed, but has taken twice a day & up to three times a day yesterday.

## 2017-08-12 NOTE — Telephone Encounter (Signed)
Patient called stating that she took 3 blood pressure pills yesterday trying to get her BP to come down. States that this is not normal. She thinks taking her off the fluid pill may be causing her BP to go up.  Please call [phone].

## 2017-08-12 NOTE — Telephone Encounter (Signed)
Have her take lisinopril 20 mg bid.

## 2017-08-12 NOTE — Telephone Encounter (Signed)
Patient notified.  She will use medication she has at home till she comes in for follow up on 09/04/2017.  She will continue to log BP readings & bring with her to next OV.

## 2017-09-03 ENCOUNTER — Encounter: Payer: Self-pay | Admitting: *Deleted

## 2017-09-04 ENCOUNTER — Other Ambulatory Visit: Payer: Self-pay

## 2017-09-04 ENCOUNTER — Ambulatory Visit: Payer: BC Managed Care – PPO | Admitting: Cardiovascular Disease

## 2017-09-04 ENCOUNTER — Encounter: Payer: Self-pay | Admitting: Cardiovascular Disease

## 2017-09-04 VITALS — BP 99/63 | HR 66 | Ht 62.0 in | Wt 139.0 lb

## 2017-09-04 DIAGNOSIS — R5383 Other fatigue: Secondary | ICD-10-CM | POA: Diagnosis not present

## 2017-09-04 DIAGNOSIS — R011 Cardiac murmur, unspecified: Secondary | ICD-10-CM

## 2017-09-04 DIAGNOSIS — I447 Left bundle-branch block, unspecified: Secondary | ICD-10-CM

## 2017-09-04 DIAGNOSIS — R0609 Other forms of dyspnea: Secondary | ICD-10-CM

## 2017-09-04 DIAGNOSIS — Q248 Other specified congenital malformations of heart: Secondary | ICD-10-CM

## 2017-09-04 DIAGNOSIS — I10 Essential (primary) hypertension: Secondary | ICD-10-CM

## 2017-09-04 DIAGNOSIS — Z7189 Other specified counseling: Secondary | ICD-10-CM

## 2017-09-04 DIAGNOSIS — R001 Bradycardia, unspecified: Secondary | ICD-10-CM

## 2017-09-04 DIAGNOSIS — I480 Paroxysmal atrial fibrillation: Secondary | ICD-10-CM

## 2017-09-04 DIAGNOSIS — R0600 Dyspnea, unspecified: Secondary | ICD-10-CM

## 2017-09-04 MED ORDER — APIXABAN 5 MG PO TABS: 5.0000 mg | ORAL_TABLET | Freq: Two times a day (BID) | ORAL | 0 refills | Status: DC

## 2017-09-04 MED ORDER — LISINOPRIL 20 MG PO TABS: 20.0000 mg | ORAL_TABLET | Freq: Two times a day (BID) | ORAL | 6 refills | Status: DC

## 2017-09-04 MED ORDER — AMLODIPINE BESYLATE 2.5 MG PO TABS: 2.5000 mg | ORAL_TABLET | ORAL | 6 refills | Status: DC

## 2017-09-04 MED ORDER — APIXABAN 5 MG PO TABS: 5.0000 mg | ORAL_TABLET | Freq: Two times a day (BID) | ORAL | 6 refills | Status: DC

## 2017-09-04 NOTE — Patient Instructions (Signed)
Medication Instructions:   Stop Aspirin.  Begin Eliquis 5mg  twice a day.  Begin Amlodipine 2.5mg  every morning.  Continue all other medications.    Labwork: none  Testing/Procedures: none  Follow-Up: 3 months   Any Other Special Instructions Will Be Listed Below (If Applicable). 1 month follow up with anticoagulation nurse.   If you need a refill on your cardiac medications before your next appointment, please call your pharmacy.

## 2017-09-04 NOTE — Progress Notes (Signed)
SUBJECTIVE: The patient returns for follow-up of left bundle branch block, palpitations, and hypertension. She is known to be quite anxious.  Nuclear stress test in May 2015 did not show myocardial scar or ischemia.  A follow-up nuclear stress test on 07/25/16 was low risk with no evidence of myocardial ischemia or scar, LVEF 71%.  Carotid Dopplers on 06/14/15 showed mild bilateral carotid artery stenosis of 1-39%.  She has been experiencing exertional dyspnea.  I obtained an echocardiogram performed on 08/07/2017 which demonstrated vigorous left ventricular systolic function, LVEF 70 to 75%, moderate focal basal hypertrophy of the septum, mitral chordal systolic anterior motion with turbulent flow in the LVOT which is small.  There was resting obstruction with a peak gradient of approximately 170 mmHg.  There was no obvious subaortic membrane.  These findings were new compared to the echo performed in 2015.  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  Regional wall motion was normal.  There is grade 1 diastolic dysfunction and mild mitral regurgitation.  There is also mild tricuspid regurgitation.  I had her stop hydrochlorothiazide and encouraged increased water intake.  I reviewed her event monitor which demonstrated sinus rhythm with frequent PVCs and one episode of rapid atrial fibrillation.  Since she increased water intake, she feels like her energy levels have improved.  She has had less episodes of exertional dyspnea and fatigue.  She drinks 4-5 bottles of water daily.  Her bowel movements have finally regulated.  She drinks coffee once or twice per week and does not drink tea or soda.  She denies syncope.  I personally reviewed her blood pressure log which demonstrates several blood pressure readings in the 148-160 range.  Heart rates average in the 60 bpm range and ranged from 57-82 bpm.   Review  of Systems: As per "subjective", otherwise negative.  Allergies  Allergen Reactions  . Cefzil [Cefprozil] Other (See Comments)    angioedema    Current Outpatient Medications  Medication Sig Dispense Refill  . aspirin 81 MG tablet Take 81 mg by mouth daily.    . Calcium Carbonate-Vitamin D (CALTRATE 600+D) 600-400 MG-UNIT per tablet Take 1 tablet by mouth daily.    . cetirizine (ZYRTEC) 10 MG tablet Take 10 mg by mouth daily.    . Coenzyme Q10 (COQ-10) 100 MG CAPS Take 1 capsule by mouth daily.    . fluticasone (FLONASE) 50 MCG/ACT nasal spray Place 2 sprays into both nostrils daily.    Marland Kitchen lisinopril (PRINIVIL,ZESTRIL) 10 MG tablet Take 2 tablets (20 mg total) by mouth 2 (two) times daily.    Marland Kitchen lisinopril (PRINIVIL,ZESTRIL) 5 MG tablet TAKE 1 TAB DAILY AS NEEDED FOR SYSTOLIC BP OVER 595 90 tablet 1  . LORazepam (ATIVAN) 1 MG tablet Take 1 mg by mouth daily as needed for anxiety.    . Melatonin 5 MG TABS Take 1 tablet by mouth at bedtime.    Marland Kitchen omega-3 acid ethyl esters (LOVAZA) 1 g capsule Take by mouth 2 (two) times daily.    . simvastatin (ZOCOR) 20 MG tablet Take 20 mg by mouth daily.    . vitamin B-12 (CYANOCOBALAMIN) 250 MCG tablet Take 250 mcg by mouth daily.     No current facility-administered medications for this visit.     Past Medical History:  Diagnosis Date  . Esophageal reflux   . Hypertension   . Other and unspecified hyperlipidemia  Past Surgical History:  Procedure Laterality Date  . ABDOMINAL HYSTERECTOMY  08/2008  . BLADDER REPAIR      Social History   Socioeconomic History  . Marital status: Married    Spouse name: Not on file  . Number of children: Not on file  . Years of education: Not on file  . Highest education level: Not on file  Occupational History  . Not on file  Social Needs  . Financial resource strain: Not on file  . Food insecurity:    Worry: Not on file    Inability: Not on file  . Transportation needs:    Medical: Not on file      Non-medical: Not on file  Tobacco Use  . Smoking status: Never Smoker  . Smokeless tobacco: Never Used  Substance and Sexual Activity  . Alcohol use: No    Alcohol/week: 0.0 oz  . Drug use: No  . Sexual activity: Not on file  Lifestyle  . Physical activity:    Days per week: Not on file    Minutes per session: Not on file  . Stress: Not on file  Relationships  . Social connections:    Talks on phone: Not on file    Gets together: Not on file    Attends religious service: Not on file    Active member of club or organization: Not on file    Attends meetings of clubs or organizations: Not on file    Relationship status: Not on file  . Intimate partner violence:    Fear of current or ex partner: Not on file    Emotionally abused: Not on file    Physically abused: Not on file    Forced sexual activity: Not on file  Other Topics Concern  . Not on file  Social History Narrative  . Not on file     Vitals:   09/04/17 1402  BP: 99/63  Pulse: 66  SpO2: 97%  Weight: 139 lb (63 kg)  Height: 5\' 2"  (1.575 m)    Wt Readings from Last 3 Encounters:  09/04/17 139 lb (63 kg)  06/30/17 138 lb 9.6 oz (62.9 kg)  06/24/16 141 lb 6.4 oz (64.1 kg)     PHYSICAL EXAM General: NAD HEENT: Normal. Neck: No JVD, no thyromegaly. Lungs: Clear to auscultation bilaterally with normal respiratory effort. CV: Regular rate and rhythm, normal S1/S2, no Q0/H4,VQ/QV holosystolic murmur at RUSB and along left sternal border. No peripheral edema.Bilateral venous varicosities in legs, right>left.  Abdomen: Soft, nontender, no distention.  Neurologic: Alert and oriented.  Psych: Normal affect. Skin: Normal. Musculoskeletal: No gross deformities.    ECG: Most recent ECG reviewed.   Labs: Lab Results  Component Value Date/Time   K 3.9 09/12/2008 09:00 AM   BUN 9 09/12/2008 09:00 AM   CREATININE 0.71 09/12/2008 09:00 AM   HGB 9.3 (L) 09/14/2008 11:20 AM     Lipids: No results found  for: LDLCALC, LDLDIRECT, CHOL, TRIG, HDL     ASSESSMENT AND PLAN: 1. Left bundle branch blockwith exertional dyspnea and bradycardia with fatigue:Normal nuclear stress test in 07/2013 and in April 2018. Average heart rate by event monitoring was in the 60 bpm range.  I stopped hydrochlorthiazide and encouraged increased fluid intake.  Symptoms are gradually improving.  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  2. Carotid artery disease: Bilateral carotid Dopplers demonstrated mild disease bilaterally on 3/15//2017. Can be  repeated in 2 years.   3. Essential HTN:  Blood pressure log reviewed above.  There were several hypertensive readings.  I will start low-dose amlodipine 2.5 mg every morning.  4. 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  She appeared to be dehydrated.  I stopped hydrochlorothiazide and encourage increased fluid intake.  Due to resting heart rate in the 60 bpm range, I am not inclined to initiate AV nodal blocking agents.  As she has increased water intake, symptoms are improving.  I encouraged her to drink double the amount she is currently drinking.  5.  Paroxysmal atrial fibrillation: Average heart rate by event monitoring was in the 60 bpm range.  Due to resting heart rate in the 60 bpm range, I am not inclined to initiate AV nodal blocking agents.  I will stop aspirin and initiate apixaban 5 mg twice daily.  I may consider an EP referral.  She clearly has conduction system disease with concomitant left bundle branch block and may need a pacemaker but not at the present time.     Disposition: Follow up 3 months  Time spent: 40 minutes, of which greater than 50% was spent reviewing symptoms, relevant blood tests and studies, and discussing management plan with the patient.   Kate Sable, M.D., F.A.C.C.

## 2017-10-10 ENCOUNTER — Telehealth: Payer: Self-pay | Admitting: Cardiovascular Disease

## 2017-10-10 NOTE — Telephone Encounter (Signed)
Pt having a tooth refilled (filling fell out) says they may have to do an extractions if they can't save the tooth and wanted to know if she needed to hold Eliquis and if so how long

## 2017-10-10 NOTE — Telephone Encounter (Signed)
Patient needs to get a filling in her tooth and would like to know since she is on blood thinner is there any precautions she will need to take

## 2017-10-13 NOTE — Telephone Encounter (Signed)
Can hold for 48 hrs.

## 2017-10-14 ENCOUNTER — Ambulatory Visit (INDEPENDENT_AMBULATORY_CARE_PROVIDER_SITE_OTHER): Payer: BC Managed Care – PPO | Admitting: *Deleted

## 2017-10-14 DIAGNOSIS — Z5181 Encounter for therapeutic drug level monitoring: Secondary | ICD-10-CM

## 2017-10-14 DIAGNOSIS — I480 Paroxysmal atrial fibrillation: Secondary | ICD-10-CM | POA: Diagnosis not present

## 2017-10-14 NOTE — Telephone Encounter (Signed)
LM to return call.

## 2017-10-14 NOTE — Telephone Encounter (Signed)
Pt aware and voiced understanding 

## 2017-10-14 NOTE — Progress Notes (Addendum)
Pt was started on Eliquis 5mg  every  12 hours  for PAF on 09/04/2017.    Reviewed patients medication list.  Pt is not currently on any combined P-gp and strong CYP3A4 inhibitors/inducers (ketoconazole, traconazole, ritonavir, carbamazepine, phenytoin, rifampin, St. John's wort).  Reviewed labs.  SCr 0.68 , Weight 63.36kg  Dose is appropriate based on age, weight, and SCr.  Hgb 11.9 and HCT 36.4  A full discussion of the nature of anticoagulants has been carried out.  A benefit/risk analysis has been presented to the patient, so that they understand the justification for choosing anticoagulation with Eliquis at this time.  The need for compliance is stressed.  Pt is aware to take the medication twice daily.  Side effects of potential bleeding are discussed, including unusual colored urine or stools, coughing up blood or coffee ground emesis, nose bleeds or serious fall or head trauma.  Discussed signs and symptoms of stroke. The patient should avoid any OTC items containing aspirin or ibuprofen.  Avoid alcohol consumption.   Call if any signs of abnormal bleeding.  Discussed financial obligations and pt is having no problems in  obtaining medication.  Next lab test in 6  months. Has tolerated taking Eliquis 5mg  q 12 hours with no sign or symptom of bleeding and has no sign or symptom of stroke  Is to have dental procedure on Thursday and was instructed by Dr Bronson Ing to hold Eliquis for 48 hours  Instructed to ask dentist when to restart Eliquis  Will get CBC and bmet and will call with results Spoke with pt and instructed that her SrCr and Hgb and HCT was within normal limits and to continue to take Eliquis 5mg  q 12 hours as instructed and made an appt for her to be seen in clinic in January 2020 with her stated understanding

## 2017-12-08 ENCOUNTER — Encounter: Payer: Self-pay | Admitting: *Deleted

## 2017-12-09 ENCOUNTER — Ambulatory Visit: Payer: BC Managed Care – PPO | Admitting: Cardiovascular Disease

## 2017-12-09 ENCOUNTER — Encounter: Payer: Self-pay | Admitting: *Deleted

## 2017-12-09 VITALS — BP 100/55 | HR 55 | Ht 62.0 in | Wt 140.2 lb

## 2017-12-09 DIAGNOSIS — R0609 Other forms of dyspnea: Secondary | ICD-10-CM

## 2017-12-09 DIAGNOSIS — I480 Paroxysmal atrial fibrillation: Secondary | ICD-10-CM

## 2017-12-09 DIAGNOSIS — R011 Cardiac murmur, unspecified: Secondary | ICD-10-CM

## 2017-12-09 DIAGNOSIS — I447 Left bundle-branch block, unspecified: Secondary | ICD-10-CM

## 2017-12-09 DIAGNOSIS — I10 Essential (primary) hypertension: Secondary | ICD-10-CM

## 2017-12-09 DIAGNOSIS — Q248 Other specified congenital malformations of heart: Secondary | ICD-10-CM | POA: Diagnosis not present

## 2017-12-09 DIAGNOSIS — R0600 Dyspnea, unspecified: Secondary | ICD-10-CM

## 2017-12-09 NOTE — Patient Instructions (Signed)

## 2017-12-09 NOTE — Progress Notes (Signed)
SUBJECTIVE: The patient returns for follow-up of left bundle branch block, palpitations, atrial fibrillation, and hypertension. She is known to be quite anxious.  Nuclear stress test in May 2015 did not show myocardial scar or ischemia.  A follow-up nuclear stress test on 07/25/16 was low risk with no evidence of myocardial ischemia or scar, LVEF 71%.  Carotid Dopplers on 06/14/15 showed mild bilateral carotid artery stenosis of 1-39%.  She has been experiencing exertional dyspnea.  I obtained an echocardiogram performed on 08/07/2017 which demonstrated vigorous left ventricular systolic function, LVEF 70 to 75%, moderate focal basal hypertrophy of the septum, mitral chordal systolic anterior motion with turbulent flow in the LVOT which is small.  There was resting obstruction with a peak gradient of approximately 170 mmHg.  There was no obvious subaortic membrane.  These findings were new compared to the echo performed in 2015.  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  Regional wall motion was normal.  There is grade 1 diastolic dysfunction and mild mitral regurgitation.  There is also mild tricuspid regurgitation.  I had her stop hydrochlorothiazide and encouraged increased water intake.  I reviewed her event monitor which demonstrated sinus rhythm with frequent PVCs and one episode of rapid atrial fibrillation.  She is trying to drink more water and sometimes drinks 6 bottles daily.  Her energy levels have improved overall.  She checks her blood pressure twice daily and morning readings are generally good with occasional hypertensive readings in the evening.  She takes amlodipine 2.5 mg every morning and takes her lisinopril in the evening depending upon with blood pressure readings.    Review of Systems: As per "subjective", otherwise negative.  Allergies  Allergen Reactions  . Cefzil [Cefprozil]  Other (See Comments)    angioedema    Current Outpatient Medications  Medication Sig Dispense Refill  . amLODipine (NORVASC) 2.5 MG tablet Take 1 tablet (2.5 mg total) by mouth every morning. 30 tablet 6  . apixaban (ELIQUIS) 5 MG TABS tablet Take 1 tablet (5 mg total) by mouth 2 (two) times daily. 60 tablet 0  . Calcium Carbonate-Vitamin D (CALTRATE 600+D) 600-400 MG-UNIT per tablet Take 1 tablet by mouth daily.    . cetirizine (ZYRTEC) 10 MG tablet Take 10 mg by mouth daily.    . Coenzyme Q10 (COQ-10) 100 MG CAPS Take 1 capsule by mouth daily.    . fluticasone (FLONASE) 50 MCG/ACT nasal spray Place 2 sprays into both nostrils daily.    Marland Kitchen lisinopril (PRINIVIL,ZESTRIL) 20 MG tablet Take 1 tablet (20 mg total) by mouth 2 (two) times daily. 60 tablet 6  . lisinopril (PRINIVIL,ZESTRIL) 5 MG tablet TAKE 1 TAB DAILY AS NEEDED FOR SYSTOLIC BP OVER 017 90 tablet 1  . LORazepam (ATIVAN) 1 MG tablet Take 1 mg by mouth daily as needed for anxiety.    . Melatonin 5 MG TABS Take 1 tablet by mouth at bedtime.    Marland Kitchen omega-3 acid ethyl esters (LOVAZA) 1 g capsule Take by mouth 2 (two) times daily.    . simvastatin (ZOCOR) 20 MG tablet Take 20 mg by mouth daily.    . vitamin B-12 (CYANOCOBALAMIN) 250 MCG tablet Take 250 mcg by mouth daily.     No current facility-administered medications for this visit.     Past Medical History:  Diagnosis Date  . Esophageal reflux   . Hypertension   . Other and  unspecified hyperlipidemia     Past Surgical History:  Procedure Laterality Date  . ABDOMINAL HYSTERECTOMY  08/2008  . BLADDER REPAIR      Social History   Socioeconomic History  . Marital status: Married    Spouse name: Not on file  . Number of children: Not on file  . Years of education: Not on file  . Highest education level: Not on file  Occupational History  . Not on file  Social Needs  . Financial resource strain: Not on file  . Food insecurity:    Worry: Not on file    Inability: Not  on file  . Transportation needs:    Medical: Not on file    Non-medical: Not on file  Tobacco Use  . Smoking status: Never Smoker  . Smokeless tobacco: Never Used  Substance and Sexual Activity  . Alcohol use: No    Alcohol/week: 0.0 standard drinks  . Drug use: No  . Sexual activity: Not on file  Lifestyle  . Physical activity:    Days per week: Not on file    Minutes per session: Not on file  . Stress: Not on file  Relationships  . Social connections:    Talks on phone: Not on file    Gets together: Not on file    Attends religious service: Not on file    Active member of club or organization: Not on file    Attends meetings of clubs or organizations: Not on file    Relationship status: Not on file  . Intimate partner violence:    Fear of current or ex partner: Not on file    Emotionally abused: Not on file    Physically abused: Not on file    Forced sexual activity: Not on file  Other Topics Concern  . Not on file  Social History Narrative  . Not on file     Vitals:   12/09/17 1003  BP: (!) 100/55  Pulse: (!) 55  SpO2: 98%  Weight: 140 lb 3.2 oz (63.6 kg)  Height: 5\' 2"  (1.575 m)    Wt Readings from Last 3 Encounters:  12/09/17 140 lb 3.2 oz (63.6 kg)  09/04/17 139 lb (63 kg)  06/30/17 138 lb 9.6 oz (62.9 kg)     PHYSICAL EXAM General: NAD HEENT: Normal. Neck: No JVD, no thyromegaly. Lungs: Clear to auscultation bilaterally with normal respiratory effort. CV: Regular rate and rhythm, normal S1/S2, no S3/S4, II/VI holosystolic murmur at RUSB and along left sternal border. No pretibial or periankle edema.  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   Abdomen: Soft, nontender, no distention.  Neurologic: Alert and oriented.  Psych: Normal affect. Skin: Normal. Musculoskeletal: No gross deformities.    ECG: Reviewed above under Subjective   Labs: Lab Results  Component Value Date/Time   K 3.9 09/12/2008 09:00 AM   BUN 9 09/12/2008  09:00 AM   CREATININE 0.71 09/12/2008 09:00 AM   HGB 9.3 (L) 09/14/2008 11:20 AM     Lipids: No results found for: LDLCALC, LDLDIRECT, CHOL, TRIG, HDL     ASSESSMENT AND PLAN:  1. Left bundle branch blockwith exertional dyspneaand bradycardia with fatigue:Normal nuclear stress test in 5/2015and in April 2018. Average heart rate by event monitoring was in the 60 bpm range.  I stopped hydrochlorthiazide and encouraged increased fluid intake.  Symptoms are gradually improving.  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  2. Carotid artery disease: Bilateral carotid Dopplers demonstrated mild disease bilaterally on 3/15//2017. Can be repeated in 2 years.   3. Essential HTN:  Pressure is normal today.  No changes.  4. 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  She appeared to be dehydrated.  I stopped hydrochlorothiazide and encourage increased fluid intake.  Due to resting heart rate in the 50-60 bpm range, I am not inclined to initiate AV nodal blocking agents.  As she has increased water intake, symptoms are improving.  I again encouraged her to drink double the amount she is currently drinking. I also asked her to quantify how many bottles of fluid she is drinking daily and to keep a diary of her symptoms to see if there is a correlation between increased fluid intake and decreased symptoms.  5.  Paroxysmal atrial fibrillation: Average heart rate by event monitoring was in the 60 bpm range.  Due to resting heart rate in the 50-60 bpm range, I am not inclined to initiate AV nodal blocking agents. Continue apixaban 5 mg twice daily.  I may consider an EP referral.  She clearly has conduction system disease with concomitant left bundle branch block and may need a pacemaker but not at the present time.    Disposition: Follow up 6 months  Kate Sable,  M.D., F.A.C.C.

## 2018-03-26 ENCOUNTER — Other Ambulatory Visit: Payer: Self-pay | Admitting: Cardiovascular Disease

## 2018-04-16 ENCOUNTER — Ambulatory Visit (INDEPENDENT_AMBULATORY_CARE_PROVIDER_SITE_OTHER): Payer: Medicare Other | Admitting: Pharmacist

## 2018-04-16 ENCOUNTER — Telehealth: Payer: Self-pay | Admitting: Cardiovascular Disease

## 2018-04-16 DIAGNOSIS — Z5181 Encounter for therapeutic drug level monitoring: Secondary | ICD-10-CM | POA: Diagnosis not present

## 2018-04-16 NOTE — Progress Notes (Signed)
Pt was started on Eliquis for Afib on 09/04/2017.    Reviewed patients medication list.  Pt is not currently on any combined P-gp and strong CYP3A4 inhibitors/inducers (ketoconazole, traconazole, ritonavir, carbamazepine, phenytoin, rifampin, St. John's wort).  Reviewed labs.  SCr 0.71, Weight 63.6kg, CrCl- 50ml/min.  Dose appropriate based on age, weight, and SCr.  Hgb and HCT Within Normal Limits  A full discussion of the nature of anticoagulants has been carried out.  A benefit/risk analysis has been presented to the patient, so that they understand the justification for choosing anticoagulation with Eliquis at this time.  The need for compliance is stressed.  Pt is aware to take the medication twice daily.  Side effects of potential bleeding are discussed, including unusual colored urine or stools, coughing up blood or coffee ground emesis, nose bleeds or serious fall or head trauma.  Discussed signs and symptoms of stroke. The patient should avoid any OTC items containing aspirin or ibuprofen.  Avoid alcohol consumption.   Call if any signs of abnormal bleeding.  Discussed financial obligations and resolved any difficulty in obtaining medication.    04/16/18: Unable to leave message on home phone; [facility] mobile spoke with patient about results.

## 2018-04-16 NOTE — Telephone Encounter (Signed)
Thinks that she maybe returning kelly's call

## 2018-04-16 NOTE — Telephone Encounter (Signed)
See anticoag encounter from 04/16/18 for details

## 2018-04-16 NOTE — Patient Instructions (Signed)
Pt was started on Eliquis for Afib  Reviewed patients medication list.  Pt is not currently on any combined P-gp and strong CYP3A4 inhibitors/inducers (ketoconazole, traconazole, ritonavir, carbamazepine, phenytoin, rifampin, St. John's wort).  Will review labs to ensure that dose remains appropriate  A full discussion of the nature of anticoagulants has been carried out.  A benefit/risk analysis has been presented to the patient, so that they understand the justification for choosing anticoagulation with Eliquis at this time.  The need for compliance is stressed.  Pt is aware to take the medication twice daily.  Side effects of potential bleeding are discussed, including unusual colored urine or stools, coughing up blood or coffee ground emesis, nose bleeds or serious fall or head trauma.  Discussed signs and symptoms of stroke. The patient should avoid any OTC items containing aspirin or ibuprofen.  Avoid alcohol consumption.   Call if any signs of abnormal bleeding.  Discussed financial obligations and resolved any difficulty in obtaining medication.

## 2018-04-22 ENCOUNTER — Telehealth: Payer: Self-pay | Admitting: *Deleted

## 2018-04-22 NOTE — Telephone Encounter (Signed)
CBC -  Notes recorded by Herminio Commons, MD on 04/17/2018 at 3:28 PM EST Normal.  CMET -  Notes recorded by Herminio Commons, MD on 04/17/2018 at 3:28 PM EST Normal.

## 2018-04-22 NOTE — Telephone Encounter (Signed)
Notes recorded by Laurine Blazer, LPN on 5/74/9355 at 21:74 AM EST Patient notified detailed voice message. Copy to pmd. Follow up scheduled for 06/25/18 with Dr. Paralee Cancel office. ------

## 2018-04-25 ENCOUNTER — Other Ambulatory Visit: Payer: Self-pay | Admitting: Cardiovascular Disease

## 2018-06-25 ENCOUNTER — Ambulatory Visit: Payer: BC Managed Care – PPO | Admitting: Cardiovascular Disease

## 2018-09-24 ENCOUNTER — Other Ambulatory Visit: Payer: Self-pay | Admitting: Cardiovascular Disease

## 2018-10-01 ENCOUNTER — Ambulatory Visit: Payer: Medicare Other | Admitting: Cardiovascular Disease

## 2018-10-22 ENCOUNTER — Other Ambulatory Visit: Payer: Self-pay | Admitting: Cardiovascular Disease

## 2018-11-11 ENCOUNTER — Telehealth: Payer: Self-pay | Admitting: *Deleted

## 2018-11-11 NOTE — Telephone Encounter (Signed)
Patient verbally consented for tele-health visits with [facility] and understands that her insurance company will be billed for the encounter.  Aware to have vitals available   

## 2018-11-12 ENCOUNTER — Telehealth: Payer: Self-pay | Admitting: Cardiovascular Disease

## 2018-11-12 NOTE — Telephone Encounter (Signed)

## 2018-11-13 ENCOUNTER — Encounter: Payer: Self-pay | Admitting: *Deleted

## 2018-11-13 ENCOUNTER — Telehealth: Payer: Self-pay | Admitting: Cardiovascular Disease

## 2018-11-13 NOTE — Telephone Encounter (Signed)

## 2018-11-16 ENCOUNTER — Ambulatory Visit: Payer: Medicare Other | Admitting: Cardiovascular Disease

## 2018-11-16 ENCOUNTER — Encounter: Payer: Self-pay | Admitting: Cardiovascular Disease

## 2018-11-16 ENCOUNTER — Other Ambulatory Visit: Payer: Self-pay

## 2018-11-16 VITALS — BP 136/60 | HR 68 | Ht 62.0 in | Wt 138.5 lb

## 2018-11-16 DIAGNOSIS — I10 Essential (primary) hypertension: Secondary | ICD-10-CM

## 2018-11-16 DIAGNOSIS — I480 Paroxysmal atrial fibrillation: Secondary | ICD-10-CM

## 2018-11-16 DIAGNOSIS — I447 Left bundle-branch block, unspecified: Secondary | ICD-10-CM

## 2018-11-16 DIAGNOSIS — I209 Angina pectoris, unspecified: Secondary | ICD-10-CM

## 2018-11-16 DIAGNOSIS — Q248 Other specified congenital malformations of heart: Secondary | ICD-10-CM

## 2018-11-16 DIAGNOSIS — R0600 Dyspnea, unspecified: Secondary | ICD-10-CM

## 2018-11-16 DIAGNOSIS — R0609 Other forms of dyspnea: Secondary | ICD-10-CM

## 2018-11-16 MED ORDER — METOPROLOL TARTRATE 25 MG PO TABS: 12.5000 mg | ORAL_TABLET | Freq: Two times a day (BID) | ORAL | 1 refills | Status: DC

## 2018-11-16 NOTE — Progress Notes (Signed)
SUBJECTIVE:  The patient returns for follow-up of left bundle branch block, palpitations, atrial fibrillation, and hypertension. She is known to be quite anxious.  Nuclear stress test in May 2015 did not show myocardial scar or ischemia.  A follow-up nuclear stress test on 07/25/16 was low risk with no evidence of myocardial ischemia or scar, LVEF 71%.  Carotid Dopplers on 06/14/15 showed mild bilateral carotid artery stenosis of 1-39%.  She has been experiencing exertional dyspnea.  I obtainedan echocardiogram performed on 08/07/2017 which demonstrated vigorous left ventricular systolic function, LVEF 70 to 75%, moderate focal basal hypertrophy of the septum, mitral chordal systolic anterior motion with turbulent flow in the LVOT which is small. There was resting obstruction with a peak gradient of approximately 170 mmHg. There was no obvious subaortic membrane. These findings were new compared to the echo performed in 2015. 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 Regional wall motion was normal. There is grade 1 diastolic dysfunction and mild mitral regurgitation. There is also mild tricuspid regurgitation.  I had her stop hydrochlorothiazide and encouraged increased water intake.  I reviewed her event monitor which demonstrated sinus rhythm with frequent PVCs and one episode of rapid atrial fibrillation.  ECG performed today which I ordered and personally reviewed demonstrates sinus rhythm with left bundle branch block and PVCs.  She told me she is feeling worse than she did when I last saw her in the fall 2019.  She has exertional dyspnea when vacuuming her house.  She has lightheadedness periodically when she bends down and stands up.  She has occasional "chest twinges "but no consistent exertional chest pain.  She denies leg swelling, orthopnea, paroxysmal nocturnal dyspnea.  She checks her  blood pressure every morning and systolic readings are generally in the 130-140 range.  It has occasionally gotten as high as 170.    Review of Systems: As per "subjective", otherwise negative.  Allergies  Allergen Reactions  . Cefzil [Cefprozil] Other (See Comments)    angioedema    Current Outpatient Medications  Medication Sig Dispense Refill  . amLODipine (NORVASC) 2.5 MG tablet TAKE 1 TABLET BY MOUTH EVERY MORNING 90 tablet 1  . Calcium Carbonate-Vitamin D (CALTRATE 600+D) 600-400 MG-UNIT per tablet Take 1 tablet by mouth 2 (two) times daily.     . cetirizine (ZYRTEC) 10 MG tablet Take 10 mg by mouth daily.    . Coenzyme Q10 (COQ-10) 100 MG CAPS Take 1 capsule by mouth daily.    Marland Kitchen ELIQUIS 5 MG TABS tablet TAKE 1 TABLET BY MOUTH TWICE DAILY 60 tablet 6  . fluticasone (FLONASE) 50 MCG/ACT nasal spray Place 2 sprays into both nostrils daily.    Marland Kitchen lisinopril (ZESTRIL) 20 MG tablet TAKE 1 TABLET BY MOUTH TWICE DAILY (Patient taking differently: Take 20 mg by mouth at bedtime. & prn in mornibg) 60 tablet 6  . LORazepam (ATIVAN) 1 MG tablet Take 1 mg by mouth daily as needed for anxiety.    Marland Kitchen omega-3 acid ethyl esters (LOVAZA) 1 g capsule Take by mouth 2 (two) times daily.    . simvastatin (ZOCOR) 20 MG tablet Take 20 mg by mouth daily.    . vitamin B-12 (CYANOCOBALAMIN) 250 MCG tablet Take 250 mcg by mouth 2 (two) times a week.      No current facility-administered medications for this visit.     Past Medical History:  Diagnosis Date  .  Esophageal reflux   . Hypertension   . Other and unspecified hyperlipidemia     Past Surgical History:  Procedure Laterality Date  . ABDOMINAL HYSTERECTOMY  08/2008  . BLADDER REPAIR      Social History   Socioeconomic History  . Marital status: Married    Spouse name: Not on file  . Number of children: Not on file  . Years of education: Not on file  . Highest education level: Not on file  Occupational History  . Not on file  Social  Needs  . Financial resource strain: Not on file  . Food insecurity    Worry: Not on file    Inability: Not on file  . Transportation needs    Medical: Not on file    Non-medical: Not on file  Tobacco Use  . Smoking status: Never Smoker  . Smokeless tobacco: Never Used  Substance and Sexual Activity  . Alcohol use: No    Alcohol/week: 0.0 standard drinks  . Drug use: No  . Sexual activity: Not on file  Lifestyle  . Physical activity    Days per week: Not on file    Minutes per session: Not on file  . Stress: Not on file  Relationships  . Social Herbalist on phone: Not on file    Gets together: Not on file    Attends religious service: Not on file    Active member of club or organization: Not on file    Attends meetings of clubs or organizations: Not on file    Relationship status: Not on file  . Intimate partner violence    Fear of current or ex partner: Not on file    Emotionally abused: Not on file    Physically abused: Not on file    Forced sexual activity: Not on file  Other Topics Concern  . Not on file  Social History Narrative  . Not on file     Vitals:   11/16/18 0823  BP: 136/60  Pulse: 68  SpO2: 99%  Weight: 138 lb 8 oz (62.8 kg)  Height: 5\' 2"  (1.575 m)    Wt Readings from Last 3 Encounters:  11/16/18 138 lb 8 oz (62.8 kg)  12/09/17 140 lb 3.2 oz (63.6 kg)  09/04/17 139 lb (63 kg)     PHYSICAL EXAM General: NAD HEENT: Normal. Neck: No JVD, no thyromegaly. Lungs: Clear to auscultation bilaterally with normal respiratory effort. CV: Regular rate and rhythm, normal S1/S2, no S3/S4, II/VI holosystolic murmur at RUSB and along left sternal borde. No pretibial or periankle edema.  No carotid bruit.   Abdomen: Soft, nontender, no distention.  Neurologic: Alert and oriented.  Psych: Normal affect. Skin: Normal. Musculoskeletal: No gross deformities.    ECG: Reviewed above under Subjective   Labs: Lab Results  Component Value  Date/Time   K 3.9 09/12/2008 09:00 AM   BUN 9 09/12/2008 09:00 AM   CREATININE 0.71 09/12/2008 09:00 AM   HGB 9.3 (L) 09/14/2008 11:20 AM     Lipids: No results found for: LDLCALC, LDLDIRECT, CHOL, TRIG, HDL     ASSESSMENT AND PLAN:  1. Left bundle branch blockwith exertional dyspneaand bradycardia with fatigue:Normal nuclear stress test in 5/2015and in April 2018.Average heart rate by event monitoring was in the 60 bpm range. I stopped hydrochlorthiazide and encouraged increased fluid intake.Symptoms have gotten slightly worse. 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  I will try Lopressor 12.5 mg twice daily.  I will also obtain a coronary CT angiogram. I will order a 2-D echocardiogram with Doppler to evaluate cardiac structure, function, and regional wall motion.  2. Carotid artery disease: Bilateral carotid Dopplers demonstrated mild disease bilaterally on 3/15//2017. Can be repeated in 2 years.   3. Essential SJG:GEZMO pressure is normal today.    I will monitor given addition of low-dose Lopressor.  4. 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 She appeared to be dehydrated. I stopped hydrochlorothiazide and encourage increased fluid intake. Due to resting heart rate in the 50-60 bpm range, I have not inclined to initiate AV nodal blocking agents. However, her symptoms have gotten worse.  I will try Lopressor 12.5 mg twice daily.  I will also obtain a coronary CT angiogram. I will order a 2-D echocardiogram with Doppler to evaluate cardiac structure, function, and regional wall motion.  5. Paroxysmal atrial fibrillation: Average heart rate by event monitoring was in the 60 bpm range. I will monitor heart rate as I am starting Lopressor 12.5 mg twice daily. Continue apixaban 5 mg twice daily. I may consider an EP referral.She clearly has  conduction system disease with concomitant left bundle branch block and may need a pacemaker but not at the present time.    Disposition: Follow up 3 months  A high level of decision making was required for increased medical complexities.    Kate Sable, M.D., F.A.C.C.

## 2018-11-16 NOTE — Patient Instructions (Addendum)
Your physician recommends that you schedule a follow-up appointment in: [place] physician has recommended you make the following change in your medication:   START LOPRESSOR 12.5 MG (1/2 TABLET) [place] physician has requested that you have an echocardiogram. Echocardiography is a painless test that uses sound waves to create images of your heart. It provides your doctor with information about the size and shape of your heart and how well your heart's chambers and valves are working. This procedure takes approximately one hour. There are no restrictions for this procedure.  Your cardiac CT will be scheduled at one of the below locations:   [facility] [address] [place], [place] [address] (336) [place] [address] [place], [place] [address] [phone]  Please arrive at the [facility] main entrance of [facility] 30-45 minutes prior to test start time. Proceed to the [facility] Radiology Department (first floor) to check-in and test prep.  Please follow these instructions carefully (unless otherwise directed):  On the Night Before the Test: . Be sure to Drink plenty of water. . Do not consume any caffeinated/decaffeinated beverages or chocolate 12 hours prior to your test. . Do not take any antihistamines 12 hours prior to your test..  On the Day of the Test: . Drink plenty of water. Do not drink any water within one hour of the test. . Do not eat any food 4 hours prior to the test. . You may take your regular medications prior to the test.  . Take metoprolol (Lopressor) two hours prior to test. . HOLD Furosemide/Hydrochlorothiazide morning of the test. . FEMALES- please wear underwire-free bra if available       After the Test: . Drink plenty of water. . After receiving IV contrast, you may experience a mild flushed feeling. This is normal. . On  occasion, you may experience a mild rash up to 24 hours after the test. This is not dangerous. If this occurs, you can take Benadryl 25 mg and increase your fluid intake. . If you experience trouble breathing, this can be serious. If it is severe call 911 IMMEDIATELY. If it is mild, please call our office. . If you take any of these medications: Glipizide/Metformin, Avandament, Glucavance, please do not take 48 hours after completing test.    Please contact the cardiac imaging nurse navigator should you have any questions/concerns Marchia Bond, RN Navigator Cardiac [place] [facility] [phone] Office  [phone] Cell

## 2018-11-21 ENCOUNTER — Other Ambulatory Visit: Payer: Self-pay | Admitting: Cardiovascular Disease

## 2018-11-23 ENCOUNTER — Other Ambulatory Visit: Payer: Self-pay | Admitting: *Deleted

## 2018-11-23 DIAGNOSIS — I480 Paroxysmal atrial fibrillation: Secondary | ICD-10-CM

## 2018-11-24 ENCOUNTER — Telehealth: Payer: Self-pay | Admitting: *Deleted

## 2018-11-24 NOTE — Telephone Encounter (Signed)
Notes recorded by Laurine Blazer, LPN on [id number] at 624THL PM EDT  Peterson Ao (husband) notified. Copy to pmd.  ------   Notes recorded by Herminio Commons, MD on 11/24/2018 at 3:11 PM EDT  ok

## 2018-11-26 ENCOUNTER — Other Ambulatory Visit: Payer: Self-pay

## 2018-11-26 ENCOUNTER — Ambulatory Visit (INDEPENDENT_AMBULATORY_CARE_PROVIDER_SITE_OTHER): Payer: Medicare Other

## 2018-11-26 DIAGNOSIS — I480 Paroxysmal atrial fibrillation: Secondary | ICD-10-CM | POA: Diagnosis not present

## 2018-11-27 ENCOUNTER — Telehealth: Payer: Self-pay | Admitting: *Deleted

## 2018-11-27 NOTE — Telephone Encounter (Signed)
Notes recorded by Laurine Blazer, LPN on [id number] at 624THL AM EDT  Patient notified. Copy to pmd. Follow up scheduled for 02/22/19 with Dr. Bronson Ing.  ------   Notes recorded by Herminio Commons, MD on 11/26/2018 at 12:56 PM EDT  Vigorous pumping function. There is moderate mitral and tricuspid valve leakage. I would increase fluid intake with water and low sugar electrolyte solutions even more so than what she is doing now. Continue Lopressor 12.5 mg twice daily.

## 2018-11-30 ENCOUNTER — Telehealth (HOSPITAL_COMMUNITY): Payer: Self-pay | Admitting: Emergency Medicine

## 2018-11-30 NOTE — Telephone Encounter (Signed)
Reaching out to patient to offer assistance regarding upcoming cardiac imaging study; pt verbalizes understanding of appt date/time, parking situation and where to check in, pre-test NPO status and medications ordered, and verified current allergies; name and call back number provided for further questions should they arise Miasha Emmons RN Navigator Cardiac Imaging [facility] [phone] office [phone] cell 

## 2018-12-01 ENCOUNTER — Other Ambulatory Visit: Payer: Self-pay

## 2018-12-01 ENCOUNTER — Ambulatory Visit (HOSPITAL_COMMUNITY): Admission: RE | Admit: 2018-12-01 | Payer: Medicare Other | Source: Ambulatory Visit

## 2018-12-01 ENCOUNTER — Ambulatory Visit (HOSPITAL_COMMUNITY)
Admission: RE | Admit: 2018-12-01 | Discharge: 2018-12-01 | Disposition: A | Discharge status: Home / Self Care | Payer: Medicare Other | Source: Ambulatory Visit | Attending: Cardiovascular Disease | Admitting: Cardiovascular Disease

## 2018-12-01 DIAGNOSIS — I209 Angina pectoris, unspecified: Secondary | ICD-10-CM | POA: Insufficient documentation

## 2018-12-01 MED ORDER — IOHEXOL 350 MG/ML SOLN
100.0000 mL | Freq: Once | INTRAVENOUS | Status: AC | PRN
  Administered 2018-12-01: 12:00:00 100 mL via INTRAVENOUS

## 2018-12-01 MED ORDER — NITROGLYCERIN 0.4 MG SL SUBL
SUBLINGUAL_TABLET | SUBLINGUAL | Status: AC
  Filled 2018-12-01: qty 2

## 2018-12-01 MED ORDER — NITROGLYCERIN 0.4 MG SL SUBL
0.8000 mg | SUBLINGUAL_TABLET | Freq: Once | SUBLINGUAL | Status: AC
  Administered 2018-12-01: 12:00:00 0.8 mg via SUBLINGUAL
  Filled 2018-12-01: qty 25

## 2018-12-01 NOTE — Progress Notes (Signed)
Ct complete. Patient denies any complaints. Patient offered snack and beverage. 

## 2018-12-11 ENCOUNTER — Telehealth: Payer: Self-pay | Admitting: *Deleted

## 2018-12-11 NOTE — Telephone Encounter (Signed)
Notes recorded by Laurine Blazer, LPN on [id number] at [id number] PM EDT  Patient notified. Copy to pmd.  ------   Notes recorded by Herminio Commons, MD on 12/08/2018 at 8:39 AM EDT  Mild nonobstructive coronary artery disease.  ------   Notes recorded by Herminio Commons, MD on 12/01/2018 at 12:57 PM EDT  Await cardiac results.

## 2018-12-11 NOTE — Telephone Encounter (Signed)
Patient still c/o not feeling good since starting most recent medication (Lopressor).  Fatigue & low BP and HR.   I have asked her to keep log for 7 days and return to office for provider review.  She verbalized understanding.

## 2019-02-22 ENCOUNTER — Ambulatory Visit: Payer: Medicare Other | Admitting: Cardiovascular Disease

## 2019-02-22 ENCOUNTER — Other Ambulatory Visit: Payer: Self-pay

## 2019-02-22 ENCOUNTER — Encounter: Payer: Self-pay | Admitting: Cardiovascular Disease

## 2019-02-22 VITALS — BP 118/74 | HR 68 | Ht 64.0 in | Wt 139.0 lb

## 2019-02-22 DIAGNOSIS — Q248 Other specified congenital malformations of heart: Secondary | ICD-10-CM

## 2019-02-22 DIAGNOSIS — I10 Essential (primary) hypertension: Secondary | ICD-10-CM

## 2019-02-22 DIAGNOSIS — I25119 Atherosclerotic heart disease of native coronary artery with unspecified angina pectoris: Secondary | ICD-10-CM | POA: Diagnosis not present

## 2019-02-22 DIAGNOSIS — I480 Paroxysmal atrial fibrillation: Secondary | ICD-10-CM | POA: Diagnosis not present

## 2019-02-22 MED ORDER — DILTIAZEM HCL 30 MG PO TABS: 30.0000 mg | ORAL_TABLET | Freq: Two times a day (BID) | ORAL | 6 refills | Status: DC

## 2019-02-22 NOTE — Patient Instructions (Signed)
Medication Instructions:   Stop the Metoprolol tart (Lopressor).   Begin Cardizem 30mg  (short acting) twice a day.  Please try this new medication for 6 weeks to see how this does with exercise tolerance - see if there is any improvement.  Continue all other medications.    Labwork: none  Testing/Procedures: none  Follow-Up: Your physician wants you to follow up in: 6 months.  You will receive a reminder letter in the mail one-two months in advance.  If you don't receive a letter, please call our office to schedule the follow up appointment   Any Other Special Instructions Will Be Listed Below (If Applicable).  If you need a refill on your cardiac medications before your next appointment, please call your pharmacy.

## 2019-02-22 NOTE — Progress Notes (Signed)
Cardiology Office Note  Date: 02/22/2019   ID: Alexandria French, DOB [birth year], MRN [id number]  PCP:  Rory Percy, MD  Cardiologist:  Kate Sable, MD Electrophysiologist:  None   Chief Complaint  Patient presents with  . Follow-up    History of Present Illness: Alexandria French is a 68 y.o. female last visit here was November 16, 2018 for follow-up of left bundle branch block, palpitations, atrial fibrillation and hypertension.  EKG on last visit demonstrated sinus rhythm with left bundle branch block and PVCs.  Patient's had previous carotid Dopplers in 2017 which showed mild bilateral carotid artery stenosis of 1 to 39%.    Echocardiogram on 11/26/2018:  The left ventricle has a visually estimated ejection fraction of 75%. The cavity size was normal. Left ventricular diastolic Doppler parameters are consistent with impaired relaxation. Elevated left ventricular end-diastolic   Mild thickening of the aortic valve. Aortic valve regurgitation is mild by color flow Doppler. Mild aortic annular calcification noted. The mitral valve is abnormal. Moderate thickening of the mitral valve leaflet. Mitral valve regurgitation is moderate by color flow Doppler.  Mitral chordal SAM is noted. LVOT is small. Peak gradient 145 mmHg.6. The tricuspid valve is grossly normal. Tricuspid valve regurgitation is moderate. The aorta is normal unless otherwise noted.  Recent cardiac CT on December 01, 2018 showed mild nonobstructive coronary artery disease primarily of the LAD.  Coronary calcium score of 149.  This was the 54 fifth percentile for age and sex control.    Continues  to C/O of DOE on moderate exertion and increased exertional fatigue. States I give out easier when doing yard work around the house.   Past Medical History:  Diagnosis Date  . Esophageal reflux   . Hypertension   . Other and unspecified hyperlipidemia     Past Surgical History:  Procedure Laterality Date  . ABDOMINAL  HYSTERECTOMY  08/2008  . BLADDER REPAIR      Current Outpatient Medications  Medication Sig Dispense Refill  . amLODipine (NORVASC) 2.5 MG tablet TAKE 1 TABLET BY MOUTH EVERY MORNING 90 tablet 1  . Calcium Carbonate-Vitamin D (CALTRATE 600+D) 600-400 MG-UNIT per tablet Take 1 tablet by mouth 2 (two) times daily.     . cetirizine (ZYRTEC) 10 MG tablet Take 10 mg by mouth daily.    . Coenzyme Q10 (COQ-10) 100 MG CAPS Take 1 capsule by mouth daily.    Marland Kitchen ELIQUIS 5 MG TABS tablet TAKE 1 TABLET BY MOUTH TWICE DAILY 60 tablet 6  . fluticasone (FLONASE) 50 MCG/ACT nasal spray Place 2 sprays into both nostrils daily.    Marland Kitchen lisinopril (ZESTRIL) 20 MG tablet TAKE 1 TABLET BY MOUTH TWICE DAILY (Patient taking differently: Take 20 mg by mouth at bedtime. & prn in mornibg) 60 tablet 6  . LORazepam (ATIVAN) 1 MG tablet Take 1 mg by mouth daily as needed for anxiety.    Marland Kitchen omega-3 acid ethyl esters (LOVAZA) 1 g capsule Take by mouth 2 (two) times daily.    . simvastatin (ZOCOR) 20 MG tablet Take 20 mg by mouth daily.    . vitamin B-12 (CYANOCOBALAMIN) 250 MCG tablet Take 250 mcg by mouth 2 (two) times a week.     . diltiazem (CARDIZEM) 30 MG tablet Take 1 tablet (30 mg total) by mouth 2 (two) times daily. 60 tablet 6   No current facility-administered medications for this visit.    Allergies:  Cefzil [cefprozil]   Social History: The patient  reports that  she has never smoked. She has never used smokeless tobacco. She reports that she does not drink alcohol or use drugs.   Family History: The patient's family history includes Arthritis in an other family member; Heart disease in an other family member; Osteoporosis in an other family member.   ROS:  Please see the history of present illness. Otherwise, complete review of systems is positive for none. Complaining of increased DOE on moderate exertion.  All other systems are reviewed and negative.   Physical Exam: VS:  BP 118/74   Pulse 68   Ht 5\' 4"   (1.626 m)   Wt 139 lb (63 kg)   SpO2 99%   BMI 23.86 kg/m , BMI Body mass index is 23.86 kg/m.  Wt Readings from Last 3 Encounters:  02/22/19 139 lb (63 kg)  11/16/18 138 lb 8 oz (62.8 kg)  12/09/17 140 lb 3.2 oz (63.6 kg)    General: Patient appears comfortable at rest. HEENT: Conjunctiva and lids normal, oropharynx clear with moist mucosa. Lungs: Clear to auscultation, nonlabored breathing at rest. Cardiac: Regular rate and rhythm,Olosystolic murmur best heard at RUSB, no pericardial rub. Extremities: No pitting edema, distal pulses 2+. Skin: Warm and dry. Musculoskeletal: No kyphosis. Neuropsychiatric: Alert and oriented x3, affect grossly appropriate.  ECG:  An ECG dated 11/16/2018 was personally reviewed today and demonstrated:  Sinus rhythm with occasional PVC, left bundle branch block rate of 67  Recent Labwork: No results found for requested labs within last 8760 hours.  No results found for: CHOL, TRIG, HDL, CHOLHDL, VLDL, LDLCALC, LDLDIRECT  Other Studies Reviewed Today:  Echocardiogram on November 26, 2018 1. The left ventricle has a visually estimated ejection fraction of 75%. The cavity size was normal. Left ventricular diastolic Doppler parameters are consistent with impaired relaxation. Elevated left ventricular end-diastolic pressure No evidence of left ventricular regional wall motion abnormalities. 2. The right ventricle has normal systolic function. The cavity was normal. There is no increase in right ventricular wall thickness. 3. The aortic valve is tricuspid. Mild thickening of the aortic valve. Aortic valve regurgitation is mild by color flow Doppler. Mild aortic annular calcification noted. 4. The mitral valve is abnormal. Moderate thickening of the mitral valve leaflet. Mitral valve regurgitation is moderate by color flow Doppler. 5. Mitral chordal SAM is noted. LVOT is small. Peak gradient 145 mmHg. 6. The tricuspid valve is grossly normal. Tricuspid  valve regurgitation is moderate. 7. The aorta is normal unless otherwise noted.  Cardiac CT on December 01, 2018 FINDINGS: Coronary calcium score: The patient's coronary artery calcium score is 149, which places the patient in the 85th percentile.  Coronary arteries: Normal coronary origins.  Left dominance  Right Coronary Artery: Small, non-dominant vessel which gives off a larger AV nodal branch. No obstructive disease.  Left Main Coronary Artery: Prominent left coronary cusp, but not anuerymsal. No stenosis. Gives off larger LAD and LCx arteries.  Left Anterior Descending Coronary Artery: Calcified proximal vessel with mild 25-49% (CADRADS2) mixed stenosis. Gives off several small diagonal branches.  Ramus Intermedius Artery: Small branch without significant stenosis.  Left Circumflex Artery: Dominant vessel that gives off a small PDA artery. Larger caliber with minimal mixed 1-24% mid-vessel stenosis (CADRADS1).  Aorta: Normal size, 28 mm at the mid ascending aorta (level of the PA bifurcation) measured double oblique. Atherosclerosis. No dissection.  Aortic Valve: Trileaflet.  Calcification of the annulus.  Other findings:  Thickening of the aortic and mitral valve leaflets.  Normal biatrial size  Normal pulmonary vein drainage into the left atrium.  Normal left atrial appendage without a thrombus.  Normal size of the pulmonary artery.  IMPRESSION: 1. Mild, non-obstructive CAD primarily of the LAD, CADRADS = 2.  2. Coronary calcium score of 149. This was 85th percentile for age and sex matched control.  3. Normal coronary origin with left dominance.  4. Thickening of the aortic and mitral valve leaflets   Assessment and Plan: 1.CAD:Recent Cardiac CT Mild, non-obstructive CAD primarily of the LAD, CADRADS =2.  Coronary calcium score of 149. This was 85th percentile for age and sex matched control.  Normal coronary origin with left  dominance.   Thickening of the aortic and mitral valve leaflets. Patient  Patient c/o sporadic left precordial atypical chest pains associated with and without activity. Denies radiation or associated symptoms.   2. Essential [id number] pressure is normal today. Continue amlodipine and Lisinopril. Patient takes Lisinopril BID. States her SBP is elevated in the evenings. Advised to monitor and call if sustained SBP at or greater than 140.   3. LVOT obstruction with audible holosystolic murmur. Hyperdynamic function with EF 75%.  Associated systolic anterior motion, diastolic dysfunction and associated LBBB. Not tolerating BB. Will switch to Cardizem 30 mg po bid for 6 weeks and if tolerated will continue. Advised to monitor for increasing symptoms of angina, syncope, or dyspnea which can be a/w increased narrowing of LVOT.   5. Paroxysmal atrial fibrillation: HR 68 and irregularly irregular. Continueapixaban 5 mg twice daily. May need EP referral in the future secondary to conduction system d/o and LBBB   Medication Adjustments/Labs and Tests Ordered: Current medicines are reviewed at length with the patient today.  Concerns regarding medicines are outlined above.    Patient Instructions  Medication Instructions:   Stop the Metoprolol tart (Lopressor).   Begin Cardizem 30mg  (short acting) twice a day.  Please try this new medication for 6 weeks to see how this does with exercise tolerance - see if there is any improvement.  Continue all other medications.    Labwork: none  Testing/Procedures: none  Follow-Up: Your physician wants you to follow up in: 6 months.  You will receive a reminder letter in the mail one-two months in advance.  If you don't receive a letter, please call our office to schedule the follow up appointment   Any Other Special Instructions Will Be Listed Below (If Applicable).  If you need a refill on your cardiac medications before your next appointment,  please call your pharmacy.      .  Coronary calcium score 149 dB 80  Signed, Levell July, NP 02/22/2019 3:07 PM    [facility] at [place], [place], [place] [address] Phone: [phone]; Fax: [phone]  The patient was discussed with Lawanda Cousins NP, and I agree with the history and assessment and plan as documented above. We will switch beta-blocker to short acting diltiazem 30 mg twice daily and assess for symptom improvement.  Kate Sable, MD, [facility]  02/22/2019 3:52 PM

## 2019-03-03 ENCOUNTER — Telehealth: Payer: Self-pay | Admitting: Cardiovascular Disease

## 2019-03-03 MED ORDER — METOPROLOL TARTRATE 25 MG PO TABS: 12.5000 mg | ORAL_TABLET | Freq: Two times a day (BID) | ORAL | 3 refills | Status: DC

## 2019-03-03 NOTE — Telephone Encounter (Signed)
diltiazem (CARDIZEM) 30 MG tablet  Patient has been experiencing more dizziness since starting this medication and wanted to know if she can go back on old medication

## 2019-03-03 NOTE — Telephone Encounter (Signed)
That is fine 

## 2019-03-03 NOTE — Telephone Encounter (Signed)
Patient informed and verbalized understanding of plan.  Stopping diltiazem 30 mg and restarting lopressor 12.5 mg BID-rx sent to pharmacy.

## 2019-04-02 DIAGNOSIS — Z950 Presence of cardiac pacemaker: Secondary | ICD-10-CM

## 2019-04-02 HISTORY — DX: Presence of cardiac pacemaker: Z95.0

## 2019-04-20 ENCOUNTER — Other Ambulatory Visit: Payer: Self-pay | Admitting: Cardiovascular Disease

## 2019-06-21 ENCOUNTER — Other Ambulatory Visit: Payer: Self-pay | Admitting: *Deleted

## 2019-06-21 MED ORDER — APIXABAN 5 MG PO TABS: 5.0000 mg | ORAL_TABLET | Freq: Two times a day (BID) | ORAL | 3 refills | Status: DC

## 2019-07-19 ENCOUNTER — Other Ambulatory Visit: Payer: Self-pay | Admitting: Cardiovascular Disease

## 2019-07-20 ENCOUNTER — Encounter: Payer: Self-pay | Admitting: Gastroenterology

## 2019-08-13 ENCOUNTER — Ambulatory Visit: Payer: Medicare Other | Admitting: Gastroenterology

## 2019-08-13 ENCOUNTER — Other Ambulatory Visit: Payer: Self-pay

## 2019-08-13 ENCOUNTER — Encounter: Payer: Self-pay | Admitting: Gastroenterology

## 2019-08-13 DIAGNOSIS — Z1211 Encounter for screening for malignant neoplasm of colon: Secondary | ICD-10-CM | POA: Diagnosis not present

## 2019-08-13 DIAGNOSIS — Z79899 Other long term (current) drug therapy: Secondary | ICD-10-CM | POA: Insufficient documentation

## 2019-08-13 NOTE — Progress Notes (Signed)
Primary Care Physician:  Rory Percy, MD  Referring Physician: Theresa Duty, PA/Dr. Nadara Mustard Primary Gastroenterologist:  Dr. Gala Romney   Chief Complaint  Patient presents with  . Colonoscopy    hemorrhoids bleeding, will be first colonoscopy    HPI:   Alexandria French is a 69 y.o. female presenting today at the request of Alexandria French, [place] for colonoscopy.   History of chronic intermittent low-volume hematochezia, moreso over the last few months. No rectal pain or burning. States history of hemorrhoids since her son was born, so she has dealt with it for 40 years. States something is always sticking out but doesn't push in. No abdominal pain. No constipation or diarrhea. BMs regular. Sometimes after each meal, sometimes just twice a day. No straining. Limiting toilet time. No weight loss or lack of appetite. If takes too big a bite, feels food hung up but otherwise no solid food dysphagia. Does not happen when eating normally. No regurgitating. No pill dysphagia. No weight loss or lack of appetite. Remote history of GERD but now diet-controlled for most part. Occasional Pepcid if eating spaghetti or similar.     Past Medical History:  Diagnosis Date  . A-fib ([place])   . CAD (coronary artery disease)   . Esophageal reflux    no meds  . Hypertension   . Other and unspecified hyperlipidemia     Past Surgical History:  Procedure Laterality Date  . ABDOMINAL HYSTERECTOMY  08/2008  . BLADDER REPAIR    . TONSILLECTOMY      Current Outpatient Medications  Medication Sig Dispense Refill  . amLODipine (NORVASC) 2.5 MG tablet TAKE 1 TABLET BY MOUTH EVERY MORNING 90 tablet 0  . apixaban (ELIQUIS) 5 MG TABS tablet Take 1 tablet (5 mg total) by mouth 2 (two) times daily. 60 tablet 3  . Calcium Carbonate-Vitamin D (CALTRATE 600+D) 600-400 MG-UNIT per tablet Take 1 tablet by mouth 2 (two) times daily.     . cetirizine (ZYRTEC) 10 MG tablet Take 10 mg by mouth daily.    .  Coenzyme Q10 (COQ-10) 100 MG CAPS Take 1 capsule by mouth daily.    . fluticasone (FLONASE) 50 MCG/ACT nasal spray Place 2 sprays into both nostrils daily.    Marland Kitchen lisinopril (ZESTRIL) 20 MG tablet TAKE 1 TABLET BY MOUTH TWICE DAILY (Patient taking differently: Take 20 mg by mouth at bedtime. & prn in mornibg) 60 tablet 6  . LORazepam (ATIVAN) 1 MG tablet Take 1 mg by mouth daily as needed for anxiety.    . metoprolol tartrate (LOPRESSOR) 25 MG tablet Take 0.5 tablets (12.5 mg total) by mouth 2 (two) times daily. 90 tablet 3  . omega-3 acid ethyl esters (LOVAZA) 1 g capsule Take by mouth 2 (two) times daily.    . simvastatin (ZOCOR) 20 MG tablet Take 20 mg by mouth daily.    . vitamin B-12 (CYANOCOBALAMIN) 250 MCG tablet Take 250 mcg by mouth 2 (two) times a week.      No current facility-administered medications for this visit.    Allergies as of 08/13/2019 - Review Complete 08/13/2019  Allergen Reaction Noted  . Cefzil [cefprozil] Other (See Comments) 08/13/2013    Family History  Problem Relation Age of Onset  . Heart disease Other   . Osteoporosis Other   . Arthritis Other   . Colon cancer Neg Hx   . Colon polyps Neg Hx     Social History   Socioeconomic History  . Marital status:  Married    Spouse name: Not on file  . Number of children: Not on file  . Years of education: Not on file  . Highest education level: Not on file  Occupational History  . Not on file  Tobacco Use  . Smoking status: Never Smoker  . Smokeless tobacco: Never Used  Substance and Sexual Activity  . Alcohol use: No    Alcohol/week: 0.0 standard drinks  . Drug use: No  . Sexual activity: Not on file  Other Topics Concern  . Not on file  Social History Narrative  . Not on file   Social Determinants of Health   Financial Resource Strain:   . Difficulty of Paying Living Expenses:   Food Insecurity:   . Worried About Charity fundraiser in the Last Year:   . Arboriculturist in the Last Year:     Transportation Needs:   . Film/video editor (Medical):   Marland Kitchen Lack of Transportation (Non-Medical):   Physical Activity:   . Days of Exercise per Week:   . Minutes of Exercise per Session:   Stress:   . Feeling of Stress :   Social Connections:   . Frequency of Communication with Friends and Family:   . Frequency of Social Gatherings with Friends and Family:   . Attends Religious Services:   . Active Member of Clubs or Organizations:   . Attends Archivist Meetings:   Marland Kitchen Marital Status:   Intimate Partner Violence:   . Fear of Current or Ex-Partner:   . Emotionally Abused:   Marland Kitchen Physically Abused:   . Sexually Abused:     Review of Systems: Gen: Denies any fever, chills, fatigue, weight loss, lack of appetite.  CV: Denies chest pain, heart palpitations, peripheral edema, syncope.  Resp: Denies shortness of breath at rest or with exertion. Denies wheezing or cough.  GI: see HPI GU : Denies urinary burning, urinary frequency, urinary hesitancy MS: Denies joint pain, muscle weakness, cramps, or limitation of movement.  Derm: Denies rash, itching, dry skin Psych: Denies depression, anxiety, memory loss, and confusion Heme: see HPI  Physical Exam: BP (!) 148/67   Pulse (!) 56   Temp (!) 96.9 F (36.1 C) (Temporal)   Ht 5\' 2"  (1.575 m)   Wt 140 lb (63.5 kg)   BMI 25.61 kg/m  General:   Alert and oriented. Pleasant and cooperative. Well-nourished and well-developed.  Head:  Normocephalic and atraumatic. Eyes:  Without icterus, sclera clear and conjunctiva pink.  Ears:  Normal auditory acuity. Mouth:  Mask in place Lungs:  Clear to auscultation bilaterally.  Heart:  S1, S2 present with systolic murmur Abdomen:  +BS, soft, non-tender and non-distended. No HSM noted. No guarding or rebound. No masses appreciated.  Rectal:  Deferred  Msk:  Symmetrical without gross deformities. Normal posture. Extremities:  Without edema. Neurologic:  Alert and  oriented  x4 Psych:  Alert and cooperative. Normal mood and affect.  ASSESSMENT: Alexandria French is a 69 y.o. female presenting today for initial screening colonoscopy, no family history of colorectal cancer or colon polyps, on Eliquis for afib.   She does note a history of chronic low-volume hematochezia, stating she has had hemorrhoids for at least 44 years since the birth of her son.   Will need to hold Eliquis X 48 hours prior.    PLAN:  Proceed with TCS with Dr. Gala Romney in near future with PROPOFOL: the risks, benefits, and alternatives have been discussed  with the patient in detail. The patient states understanding and desires to proceed.  Hold Eliquis X 48 hours prior  Further recommendations to follow   Annitta Needs, PhD, ANP-BC [facility]

## 2019-08-13 NOTE — Patient Instructions (Signed)
We are arranging a colonoscopy in the near future.  Please stop Eliquis 48 hours before the procedure.  Further recommendations to follow!  It was a pleasure to see you today. I want to create trusting relationships with patients to provide genuine, compassionate, and quality care. I value your feedback. If you receive a survey regarding your visit,  I greatly appreciate you taking time to fill this out.   Annitta Needs, PhD, ANP-BC [facility]

## 2019-08-17 ENCOUNTER — Telehealth: Payer: Self-pay | Admitting: Cardiovascular Disease

## 2019-08-17 ENCOUNTER — Ambulatory Visit: Payer: Medicare PPO | Admitting: Cardiovascular Disease

## 2019-08-17 ENCOUNTER — Other Ambulatory Visit: Payer: Self-pay

## 2019-08-17 ENCOUNTER — Encounter: Payer: Self-pay | Admitting: Cardiovascular Disease

## 2019-08-17 VITALS — BP 138/64 | HR 62 | Ht 62.0 in | Wt 140.0 lb

## 2019-08-17 DIAGNOSIS — R001 Bradycardia, unspecified: Secondary | ICD-10-CM

## 2019-08-17 DIAGNOSIS — R0600 Dyspnea, unspecified: Secondary | ICD-10-CM | POA: Diagnosis not present

## 2019-08-17 DIAGNOSIS — I447 Left bundle-branch block, unspecified: Secondary | ICD-10-CM

## 2019-08-17 DIAGNOSIS — Q248 Other specified congenital malformations of heart: Secondary | ICD-10-CM | POA: Diagnosis not present

## 2019-08-17 DIAGNOSIS — I340 Nonrheumatic mitral (valve) insufficiency: Secondary | ICD-10-CM | POA: Diagnosis not present

## 2019-08-17 DIAGNOSIS — R5383 Other fatigue: Secondary | ICD-10-CM

## 2019-08-17 DIAGNOSIS — I2583 Coronary atherosclerosis due to lipid rich plaque: Secondary | ICD-10-CM | POA: Diagnosis not present

## 2019-08-17 DIAGNOSIS — I2510 Atherosclerotic heart disease of native coronary artery without angina pectoris: Secondary | ICD-10-CM | POA: Diagnosis not present

## 2019-08-17 DIAGNOSIS — R0609 Other forms of dyspnea: Secondary | ICD-10-CM

## 2019-08-17 DIAGNOSIS — I480 Paroxysmal atrial fibrillation: Secondary | ICD-10-CM

## 2019-08-17 MED ORDER — METOPROLOL TARTRATE 25 MG PO TABS: 12.5000 mg | ORAL_TABLET | Freq: Two times a day (BID) | ORAL | Status: DC

## 2019-08-17 NOTE — Patient Instructions (Addendum)
Medication Instructions:   Stop Lopressor.  After 1 month - if need to resume - do so at previous dose.   Continue all other medications.    Labwork: none  Testing/Procedures:  Your physician has requested that you have an echocardiogram. Echocardiography is a painless test that uses sound waves to create images of your heart. It provides your doctor with information about the size and shape of your heart and how well your heart's chambers and valves are working. This procedure takes approximately one hour. There are no restrictions for this procedure.  Office will contact with results via phone or letter.    Follow-Up: 2 months   Any Other Special Instructions Will Be Listed Below (If Applicable).  If you need a refill on your cardiac medications before your next appointment, please call your pharmacy.

## 2019-08-17 NOTE — Progress Notes (Signed)
SUBJECTIVE: The patient presents routine follow-up.  She developed dizziness with diltiazem and was resumed on Lopressor 12.5 mg twice daily on 03/03/2019.  She has backed off on her water consumption.  Her symptoms of exertional dyspnea have gotten worse since I last evaluated her.  She is to be out of back in the whole house and now her husband has to do the vacuuming.  She used to be able to push mow her lawn but she cannot do this either.  She denies leg swelling, orthopnea, paroxysmal nocturnal dyspnea.  Her blood pressure tends to fluctuate.  Her heart rate stays in the 50-60 bpm range.     Review of Systems: As per "subjective", otherwise negative.  Allergies  Allergen Reactions  . Cefzil [Cefprozil] Other (See Comments)    angioedema    Current Outpatient Medications  Medication Sig Dispense Refill  . amLODipine (NORVASC) 2.5 MG tablet TAKE 1 TABLET BY MOUTH EVERY MORNING 90 tablet 0  . apixaban (ELIQUIS) 5 MG TABS tablet Take 1 tablet (5 mg total) by mouth 2 (two) times daily. 60 tablet 3  . Calcium Carbonate-Vitamin D (CALTRATE 600+D) 600-400 MG-UNIT per tablet Take 1 tablet by mouth 2 (two) times daily.     . cetirizine (ZYRTEC) 10 MG tablet Take 10 mg by mouth daily.    . Coenzyme Q10 (COQ-10) 100 MG CAPS Take 1 capsule by mouth daily.    . fluticasone (FLONASE) 50 MCG/ACT nasal spray Place 2 sprays into both nostrils daily.    Marland Kitchen lisinopril (ZESTRIL) 20 MG tablet TAKE 1 TABLET BY MOUTH TWICE DAILY (Patient taking differently: Take 20 mg by mouth at bedtime. & prn in mornibg) 60 tablet 6  . LORazepam (ATIVAN) 1 MG tablet Take 1 mg by mouth daily as needed for anxiety.    . metoprolol tartrate (LOPRESSOR) 25 MG tablet Take 0.5 tablets (12.5 mg total) by mouth 2 (two) times daily. (08/17/2019 - on hold currently)    . omega-3 acid ethyl esters (LOVAZA) 1 g capsule Take by mouth 2 (two) times daily.    . simvastatin (ZOCOR) 20 MG tablet Take 20 mg by mouth daily.    .  vitamin B-12 (CYANOCOBALAMIN) 250 MCG tablet Take 250 mcg by mouth 2 (two) times a week.      No current facility-administered medications for this visit.    Past Medical History:  Diagnosis Date  . A-fib ([place])   . CAD (coronary artery disease)   . Esophageal reflux    no meds  . Hypertension   . Other and unspecified hyperlipidemia     Past Surgical History:  Procedure Laterality Date  . ABDOMINAL HYSTERECTOMY  08/2008  . BLADDER REPAIR    . TONSILLECTOMY      Social History   Socioeconomic History  . Marital status: Married    Spouse name: Not on file  . Number of children: Not on file  . Years of education: Not on file  . Highest education level: Not on file  Occupational History  . Not on file  Tobacco Use  . Smoking status: Never Smoker  . Smokeless tobacco: Never Used  Substance and Sexual Activity  . Alcohol use: No    Alcohol/week: 0.0 standard drinks  . Drug use: No  . Sexual activity: Not on file  Other Topics Concern  . Not on file  Social History Narrative  . Not on file   Social Determinants of Health   Financial Resource  Strain:   . Difficulty of Paying Living Expenses:   Food Insecurity:   . Worried About Charity fundraiser in the Last Year:   . Arboriculturist in the Last Year:   Transportation Needs:   . Film/video editor (Medical):   Marland Kitchen Lack of Transportation (Non-Medical):   Physical Activity:   . Days of Exercise per Week:   . Minutes of Exercise per Session:   Stress:   . Feeling of Stress :   Social Connections:   . Frequency of Communication with Friends and Family:   . Frequency of Social Gatherings with Friends and Family:   . Attends Religious Services:   . Active Member of Clubs or Organizations:   . Attends Archivist Meetings:   Marland Kitchen Marital Status:   Intimate Partner Violence:   . Fear of Current or Ex-Partner:   . Emotionally Abused:   Marland Kitchen Physically Abused:   . Sexually Abused:     Orson Slick, LPN was  present throughout the entirety of the encounter.  Vitals:   08/17/19 1615  BP: 138/64  Pulse: 62  SpO2: 98%  Weight: 140 lb (63.5 kg)  Height: 5\' 2"  (1.575 m)    Wt Readings from Last 3 Encounters:  08/17/19 140 lb (63.5 kg)  08/13/19 140 lb (63.5 kg)  02/22/19 139 lb (63 kg)     PHYSICAL EXAM General: NAD HEENT: Normal. Neck: No JVD, no thyromegaly. Lungs: Clear to auscultation bilaterally with normal respiratory effort. CV: Regular rate and rhythm, normal S1/S2, no S3/S4, II/VI holosystolic murmur at RUSB and along left sternal border. No pretibial or periankle edema.  No carotid bruit.   Abdomen: Soft, nontender, no distention.  Neurologic: Alert and oriented.  Psych: Normal affect. Skin: Normal. Musculoskeletal: No gross deformities.      Labs: Lab Results  Component Value Date/Time   K 3.9 09/12/2008 09:00 AM   BUN 9 09/12/2008 09:00 AM   CREATININE 0.71 09/12/2008 09:00 AM   HGB 9.3 (L) 09/14/2008 11:20 AM     Lipids: No results found for: LDLCALC, LDLDIRECT, CHOL, TRIG, HDL     Echocardiogram 11/26/2018:  1. The left ventricle has a visually estimated ejection fraction of 75%.  The cavity size was normal. Left ventricular diastolic Doppler parameters  are consistent with impaired relaxation. Elevated left ventricular  end-diastolic pressure No evidence of  left ventricular regional wall motion abnormalities.  2. The right ventricle has normal systolic function. The cavity was  normal. There is no increase in right ventricular wall thickness.  3. The aortic valve is tricuspid. Mild thickening of the aortic valve.  Aortic valve regurgitation is mild by color flow Doppler. Mild aortic  annular calcification noted.  4. The mitral valve is abnormal. Moderate thickening of the mitral valve  leaflet. Mitral valve regurgitation is moderate by color flow Doppler.  5. Mitral chordal SAM is noted. LVOT is small. Peak gradient 145 mmHg.  6. The tricuspid  valve is grossly normal. Tricuspid valve regurgitation  is moderate.  7. The aorta is normal unless otherwise noted.    Coronary CT angiography 12/03/2018:  FINDINGS: Coronary calcium score: The patient's coronary artery calcium score is 149, which places the patient in the 85th percentile.  Coronary arteries: Normal coronary origins.  Left dominance  Right Coronary Artery: Small, non-dominant vessel which gives off a larger AV nodal branch. No obstructive disease.  Left Main Coronary Artery: Prominent left coronary cusp, but not anuerymsal. No stenosis.  Gives off larger LAD and LCx arteries.  Left Anterior Descending Coronary Artery: Calcified proximal vessel with mild 25-49% (CADRADS2) mixed stenosis. Gives off several small diagonal branches.  Ramus Intermedius Artery: Small branch without significant stenosis.  Left Circumflex Artery: Dominant vessel that gives off a small PDA artery. Larger caliber with minimal mixed 1-24% mid-vessel stenosis (CADRADS1).  Aorta: Normal size, 28 mm at the mid ascending aorta (level of the PA bifurcation) measured double oblique. Atherosclerosis. No dissection.  Aortic Valve: Trileaflet.  Calcification of the annulus.  Other findings:  Thickening of the aortic and mitral valve leaflets.  Normal biatrial size  Normal pulmonary vein drainage into the left atrium.  Normal left atrial appendage without a thrombus.  Normal size of the pulmonary artery.  IMPRESSION: 1. Mild, non-obstructive CAD primarily of the LAD, CADRADS = 2.  2. Coronary calcium score of 149. This was 85th percentile for age and sex matched control.  3. Normal coronary origin with left dominance.  4. Thickening of the aortic and mitral valve leaflets   ASSESSMENT AND PLAN:  1. Left bundle branch block:No significant coronary artery disease by coronary CT angiography in September 2020 as detailed above.Average heart rate by event  monitoring was in the 60 bpm range.   2. Carotid artery disease: Bilateral carotid Dopplers demonstrated mild disease bilaterally on 3/15//2017. Can be repeated in 2 years.   3. Essential ZC:[id number] pressure is normal today.  No changes.  4. 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  She initially appeared to be dehydrated. I stopped hydrochlorothiazide and encouraged increased fluid intake. Due to resting heart rate in the50-60 bpm range, I have  been unable to advance AV nodal blocking dosage. She developed dizziness with diltiazem and was switched back to Lopressor 12.5 mg twice daily.  She has cut back on her water consumption I encouraged her to drink at least 6-8 bottles of water daily.  I will obtain a repeat echocardiogram to assess for progressive valvular heart disease.  5. Paroxysmal atrial fibrillation: Average heart rate by event monitoring was in the 60 bpm range.  Continue Lopressor 12.5 mg twice daily.  She developed dizziness with diltiazem. Continueapixaban 5 mg twice daily. I may consider an EP referral.She clearly has conduction system disease with concomitant left bundle branch block and may need a pacemaker but not at the present time.    Disposition: Follow up 2 months virtual visit   Kate Sable, M.D., F.A.C.C.

## 2019-08-17 NOTE — Telephone Encounter (Signed)
Pre-cert Verification for the following procedure    ECHO   DATE: 08/27/2019  LOCATION: [facility]

## 2019-08-27 ENCOUNTER — Telehealth: Payer: Self-pay | Admitting: *Deleted

## 2019-08-27 ENCOUNTER — Ambulatory Visit (HOSPITAL_COMMUNITY)
Admission: RE | Admit: 2019-08-27 | Discharge: 2019-08-27 | Disposition: A | Discharge status: Home / Self Care | Payer: Medicare PPO | Source: Ambulatory Visit | Attending: Cardiovascular Disease | Admitting: Cardiovascular Disease

## 2019-08-27 ENCOUNTER — Other Ambulatory Visit: Payer: Self-pay

## 2019-08-27 DIAGNOSIS — I340 Nonrheumatic mitral (valve) insufficiency: Secondary | ICD-10-CM | POA: Diagnosis not present

## 2019-08-27 DIAGNOSIS — R0600 Dyspnea, unspecified: Secondary | ICD-10-CM

## 2019-08-27 DIAGNOSIS — R0609 Other forms of dyspnea: Secondary | ICD-10-CM

## 2019-08-27 NOTE — Progress Notes (Signed)
*  PRELIMINARY RESULTS* Echocardiogram 2D Echocardiogram has been performed.  Leavy Cella 08/27/2019, 12:36 PM

## 2019-08-27 NOTE — Telephone Encounter (Signed)
-----   Message from Herminio Commons, MD sent at 08/27/2019 12:45 PM EDT ----- Strong pumping function. No significant valve leakage.

## 2019-08-27 NOTE — Telephone Encounter (Signed)
Patient informed. Copy sent to PCP °

## 2019-09-13 ENCOUNTER — Other Ambulatory Visit: Payer: Self-pay | Admitting: Cardiovascular Disease

## 2019-10-07 DIAGNOSIS — R9430 Abnormal result of cardiovascular function study, unspecified: Secondary | ICD-10-CM | POA: Diagnosis not present

## 2019-10-07 DIAGNOSIS — E7800 Pure hypercholesterolemia, unspecified: Secondary | ICD-10-CM | POA: Diagnosis not present

## 2019-10-07 DIAGNOSIS — D519 Vitamin B12 deficiency anemia, unspecified: Secondary | ICD-10-CM | POA: Diagnosis not present

## 2019-10-07 DIAGNOSIS — R5383 Other fatigue: Secondary | ICD-10-CM | POA: Diagnosis not present

## 2019-10-07 DIAGNOSIS — I10 Essential (primary) hypertension: Secondary | ICD-10-CM | POA: Diagnosis not present

## 2019-10-07 DIAGNOSIS — R011 Cardiac murmur, unspecified: Secondary | ICD-10-CM | POA: Diagnosis not present

## 2019-10-11 ENCOUNTER — Telehealth: Payer: Self-pay

## 2019-10-11 ENCOUNTER — Telehealth: Payer: Self-pay | Admitting: Gastroenterology

## 2019-10-11 DIAGNOSIS — I10 Essential (primary) hypertension: Secondary | ICD-10-CM | POA: Diagnosis not present

## 2019-10-11 DIAGNOSIS — N393 Stress incontinence (female) (male): Secondary | ICD-10-CM | POA: Diagnosis not present

## 2019-10-11 DIAGNOSIS — Z0001 Encounter for general adult medical examination with abnormal findings: Secondary | ICD-10-CM | POA: Diagnosis not present

## 2019-10-11 DIAGNOSIS — I421 Obstructive hypertrophic cardiomyopathy: Secondary | ICD-10-CM | POA: Diagnosis not present

## 2019-10-11 DIAGNOSIS — Z6825 Body mass index (BMI) 25.0-25.9, adult: Secondary | ICD-10-CM | POA: Diagnosis not present

## 2019-10-11 DIAGNOSIS — D509 Iron deficiency anemia, unspecified: Secondary | ICD-10-CM | POA: Diagnosis not present

## 2019-10-11 DIAGNOSIS — G2581 Restless legs syndrome: Secondary | ICD-10-CM | POA: Diagnosis not present

## 2019-10-11 DIAGNOSIS — K625 Hemorrhage of anus and rectum: Secondary | ICD-10-CM | POA: Diagnosis not present

## 2019-10-11 NOTE — Telephone Encounter (Signed)
Dr. Nadara Mustard called and said that the patient now has anemia and rectal bleeding.

## 2019-10-11 NOTE — Telephone Encounter (Signed)
Spoke with pt. Pt was seeing Dr. Raliegh Ip from [facility]. Pt was told this morning that Dr. Raliegh Ip is no longer with the practice. I called [facility] and was told to send letter to their office and they would decide which provider to give instruction on Eliquis for pt. Letter faxed [phone].

## 2019-10-11 NOTE — Telephone Encounter (Signed)
Left a message with pts spouse. Pt advise to hold Eliquis 48 hours prior to her procedure. I need to know who prescribes pts Eliquis.

## 2019-10-11 NOTE — Telephone Encounter (Signed)
Dr. Nadara Mustard called and said that the patient now has anemia and rectal bleeding

## 2019-10-11 NOTE — Telephone Encounter (Signed)
Left a detailed message for Dr. Sondra Barges staff, requesting lab work. Waiting on a return call.

## 2019-10-11 NOTE — Telephone Encounter (Signed)
OK to hold Eliquis 48 hours before procedure and resume night of procedure if OK with MD.

## 2019-10-11 NOTE — Telephone Encounter (Signed)
Noted. Routing to [facility].

## 2019-10-11 NOTE — Telephone Encounter (Signed)
She has had rectal bleeding intermittently long-standing. Please have all labs sent to me for review. She definitely needs a colonoscopy. May need to consider EGD. I don't think we necessarily need an office visit for this but I need labs ASAP to determine.

## 2019-10-11 NOTE — Telephone Encounter (Signed)
Spoke with pt. Pt was seeing Dr. Raliegh Ip from [facility]. Pt was told this morning that Dr. Raliegh Ip is no longer with the practice. I called [facility] and was told to send letter to their office and they would decide which provider to give instruction on Eliquis for pt. Letter faxed [phone].  AB, please advise if pt would need to be seen in office prior to TCS. Pt stated that she saw her PCP this morning and was told she has anemia and some blood must be coming out somewhere.

## 2019-10-12 NOTE — Telephone Encounter (Signed)
Fowarding to AB to get orders if patient needs TCS only or with EGD and patient will be scheduled with Dr. Abbey Chatters.

## 2019-10-27 ENCOUNTER — Telehealth: Payer: Medicare PPO | Admitting: Cardiovascular Disease

## 2019-10-28 ENCOUNTER — Encounter: Payer: Self-pay | Admitting: Family Medicine

## 2019-10-28 ENCOUNTER — Ambulatory Visit: Payer: Medicare PPO | Admitting: Family Medicine

## 2019-10-28 VITALS — BP 148/70 | HR 68 | Ht 62.0 in | Wt 141.0 lb

## 2019-10-28 DIAGNOSIS — I480 Paroxysmal atrial fibrillation: Secondary | ICD-10-CM

## 2019-10-28 DIAGNOSIS — I447 Left bundle-branch block, unspecified: Secondary | ICD-10-CM | POA: Diagnosis not present

## 2019-10-28 DIAGNOSIS — Q248 Other specified congenital malformations of heart: Secondary | ICD-10-CM | POA: Diagnosis not present

## 2019-10-28 DIAGNOSIS — I6523 Occlusion and stenosis of bilateral carotid arteries: Secondary | ICD-10-CM

## 2019-10-28 DIAGNOSIS — I10 Essential (primary) hypertension: Secondary | ICD-10-CM

## 2019-10-28 MED ORDER — AMLODIPINE BESYLATE 2.5 MG PO TABS: 2.5000 mg | ORAL_TABLET | Freq: Every morning | ORAL | 3 refills | Status: DC

## 2019-10-28 MED ORDER — APIXABAN 5 MG PO TABS: 5.0000 mg | ORAL_TABLET | Freq: Two times a day (BID) | ORAL | 6 refills | Status: DC

## 2019-10-28 NOTE — Progress Notes (Signed)
Cardiology Office Note  Date: 10/29/2019   ID: Alexandria French, DOB [birth year], MRN [id number]  PCP:  Alexandria Percy, MD  Cardiologist:  No primary care provider on file. Electrophysiologist:  None   Chief Complaint: Follow-up DOE  History of Present Illness: Alexandria French is a 69 y.o. female with a history of DOE, mitral valve regurgitation, CAD, PAF, LBBB, LVOT obstruction, bradycardia.  Last seen by Dr. Bronson French 08/17/2019.  She had previously developed dizziness with diltiazem.  This was stopped and she was resumed on Lopressor 12.5 mg twice daily.  She had decreased her water consumption due to increased exertional dyspnea.  She denied any orthopnea, PND, lower extremity edema.  Blood pressure was fluctuating.  Heart rates were in the 50s to 60s range.  Murmur with exertional dyspnea and fatigue/LV out flow tract obstruction.  She was encouraged to drink at least 6-8 bottles of water daily.  Repeat echo was ordered.  There was mention of possible referral for EP given bundle branch block and conduction system disease.  There was mention of a repeat carotid study in 2 years.  She was continuing Lopressor 12.5 mg daily along with Eliquis 5 mg p.o. twice daily.  Here today with complaints of intermittent lightheadedness and dizziness, worsening over the last 2 months. Patient states she has noticed some increased dizziness when bending over along with some increased dyspnea when bending over as well as increased dyspnea on exertion even on her normal activities such as walking on level ground.  Recent evidence of anemia.  Primary care provider suspects possible GI bleed.  Having symptomatic PVCs with activity.  Fatigue and leg pain/weakness worsening over the last month.  Blood pressure 148/70 on arrival.  EKG today shows sinus rhythm with frequent premature ventricular complexes, possible left atrial enlargement, nonspecific intraventricular block, cannot rule out septal infarct, age  undetermined, marked T wave abnormality, consider inferolateral ischemia heart rate of 79.  Echocardiogram on 11/06/2018 showed EF 75%, Doppler parameters consistent with impaired relaxation, elevated LVEDP mild AR. Moderate MR. Mitral chordal SAM noted. LVOT was small. Peak gradient of 145 mmHg. Echo Aug 27, 2019 showed LVOT peak gradient 213 mmHg. EF 70-75% hyperdynamic mild concentric LVH, G1 DD, trivial MR, mild AR.    Past Medical History:  Diagnosis Date  . A-fib ([facility] [place])   . CAD (coronary artery disease)   . Esophageal reflux    no meds  . Hypertension   . Other and unspecified hyperlipidemia     Past Surgical History:  Procedure Laterality Date  . ABDOMINAL HYSTERECTOMY  08/2008  . BLADDER REPAIR    . TONSILLECTOMY      Current Outpatient Medications  Medication Sig Dispense Refill  . Calcium Carbonate-Vitamin D (CALTRATE 600+D) 600-400 MG-UNIT per tablet Take 1 tablet by mouth 2 (two) times daily.     . cetirizine (ZYRTEC) 10 MG tablet Take 10 mg by mouth daily.    . Coenzyme Q10 (COQ-10) 100 MG CAPS Take 1 capsule by mouth daily.    . ferrous sulfate 325 (65 FE) MG tablet Take 325 mg by mouth daily with breakfast.    . fluticasone (FLONASE) 50 MCG/ACT nasal spray Place 2 sprays into both nostrils daily.    Alexandria French lisinopril (ZESTRIL) 20 MG tablet TAKE 1 TABLET BY MOUTH TWICE DAILY (Patient taking differently: Take 20 mg by mouth at bedtime. & prn in mornibg) 60 tablet 6  . LORazepam (ATIVAN) 1 MG tablet Take 1 mg by mouth daily as needed for  anxiety.    Alexandria French omega-3 acid ethyl esters (LOVAZA) 1 g capsule Take by mouth 2 (two) times daily.    . simvastatin (ZOCOR) 20 MG tablet Take 20 mg by mouth daily.    . vitamin B-12 (CYANOCOBALAMIN) 250 MCG tablet Take 250 mcg by mouth 2 (two) times a week.     . vitamin C (ASCORBIC ACID) 250 MG tablet Take 500 mg by mouth daily.    Alexandria French amLODipine (NORVASC) 2.5 MG tablet Take 1 tablet (2.5 mg total) by mouth every morning. 3 tablet 0  . apixaban  (ELIQUIS) 5 MG TABS tablet Take 1 tablet (5 mg total) by mouth 2 (two) times daily. 60 tablet 6   No current facility-administered medications for this visit.   Allergies:  Cefzil [cefprozil]   Social History: The patient  reports that she has never smoked. She has never used smokeless tobacco. She reports that she does not drink alcohol and does not use drugs.   Family History: The patient's family history includes Arthritis in an other family member; Heart disease in an other family member; Osteoporosis in an other family member.   ROS:  Please see the history of present illness. Otherwise, complete review of systems is positive for none.  All other systems are reviewed and negative.   Physical Exam: VS:  BP (!) 148/70   Pulse 68   Ht 5\' 2"  (1.575 m)   Wt 141 lb (64 kg)   SpO2 97%   BMI 25.79 kg/m , BMI Body mass index is 25.79 kg/m.  Wt Readings from Last 3 Encounters:  10/28/19 141 lb (64 kg)  08/17/19 140 lb (63.5 kg)  08/13/19 140 lb (63.5 kg)    General: Patient appears comfortable at rest. Neck: Supple, no elevated JVP or carotid bruits, no thyromegaly. Lungs: Clear to auscultation, nonlabored breathing at rest. Cardiac: Regular rate and rhythm, no S3 3/6 systolic murmur heard throughout all auscultation points., no pericardial rub. Extremities: No pitting edema, distal pulses 2+. Skin: Warm and dry. Musculoskeletal: No kyphosis. Neuropsychiatric: Alert and oriented x3, affect grossly appropriate.  ECG:  An ECG dated 10/28/2019 was personally reviewed today and demonstrated:  Sinus rhythm seventy-nine, frequent PVCs. Possible LAE, nonspecific intraventricular conduction block. Cannot rule out septal infarct. Marked T wave abnormality consider inferolateral ischemia  Recent Labwork: No results found for requested labs within last 8760 hours.  No results found for: CHOL, TRIG, HDL, CHOLHDL, VLDL, LDLCALC, LDLDIRECT  Other Studies Reviewed Today:  Echocardiogram  08/27/2019  1. LVOT peak gradient 213 mmHg. Left ventricular ejection fraction, by estimation, is 70 to 75%. The left ventricle has hyperdynamic function. The left ventricle has no regional wall motion abnormalities. There is mild concentric left ventricular hypertrophy. Left ventricular diastolic parameters are consistent with Grade I diastolic dysfunction (impaired relaxation). Elevated left atrial pressure. 2. Right ventricular systolic function is normal. The right ventricular size is normal. There is normal pulmonary artery systolic pressure. 3. The mitral valve is grossly normal. Trivial mitral valve regurgitation. 4. The aortic valve is tricuspid. Aortic valve regurgitation is mild. No aortic stenosis is present. 5. The inferior vena cava is normal in size with greater than 50% respiratory variability, suggesting right atrial pressure of 3 mmHg.   Echocardiogram 11/26/2018: 1. The left ventricle has a visually estimated ejection fraction of 75%.  The cavity size was normal. Left ventricular diastolic Doppler parameters  are consistent with impaired relaxation. Elevated left ventricular  end-diastolic pressure No evidence of  left ventricular regional  wall motion abnormalities.  2. The right ventricle has normal systolic function. The cavity was  normal. There is no increase in right ventricular wall thickness.  3. The aortic valve is tricuspid. Mild thickening of the aortic valve.  Aortic valve regurgitation is mild by color flow Doppler. Mild aortic  annular calcification noted.  4. The mitral valve is abnormal. Moderate thickening of the mitral valve  leaflet. Mitral valve regurgitation is moderate by color flow Doppler.  5. Mitral chordal SAM is noted. LVOT is small. Peak gradient 145 mmHg.  6. The tricuspid valve is grossly normal. Tricuspid valve regurgitation  is moderate.  7. The aorta is normal unless otherwise noted.    Coronary CT angiography  12/03/2018: FINDINGS: Coronary calcium score: The patient's coronary artery calcium score is 149, which places the patient in the 85th percentile.  Coronary arteries: Normal coronary origins. Left dominance  Right Coronary Artery: Small, non-dominant vessel which gives off a larger AV nodal branch. No obstructive disease.  Left Main Coronary Artery: Prominent left coronary cusp, but not anuerymsal. No stenosis. Gives off larger LAD and LCx arteries.  Left Anterior Descending Coronary Artery: Calcified proximal vessel with mild 25-49% (CADRADS2) mixed stenosis. Gives off several small diagonal branches.  Ramus Intermedius Artery: Small branch without significant stenosis.  Left Circumflex Artery: Dominant vessel that gives off a small PDA artery. Larger caliber with minimal mixed 1-24% mid-vessel stenosis (CADRADS1).  Aorta: Normal size, 28 mm at the mid ascending aorta (level of the PA bifurcation) measured double oblique. Atherosclerosis. No dissection.  Aortic Valve: Trileaflet. Calcification of the annulus.  Other findings:  Thickening of the aortic and mitral valve leaflets.  Normal biatrial size  Normal pulmonary vein drainage into the left atrium.  Normal left atrial appendage without a thrombus.  Normal size of the pulmonary artery.  IMPRESSION: 1. Mild, non-obstructive CAD primarily of the LAD, CADRADS = 2.  2. Coronary calcium score of 149. This was 85th percentile for age and sex matched control.  3. Normal coronary origin with left dominance.  4. Thickening of the aortic and mitral valve leaflets   Assessment and Plan:  1. LBBB (left bundle branch block)   2. Bilateral carotid artery stenosis   3. Essential hypertension   4. Left ventricular outflow tract obstruction   5. Paroxysmal atrial fibrillation (HCC)     1. LBBB (left bundle branch block) Evidence of LBBB on EKGs.  2. Bilateral carotid artery stenosis 08/25/2013.  Carotid duplex imaging, with color Doppler, of the carotid arteries reveals heterogenous plaque in both proximal ICA's. 1-39% bilateral ICA stenoses. The vertebral arteries are patent with antegrade flow, bilaterally. Recommended follow-up in 2 years. We will need to schedule a follow-up study at next office visit.   3. Essential hypertension Blood pressure slightly elevated today at 148/70. Continue lisinopril 20 mg p.o. daily. Blood pressure on 08/17/2019 was 138/64. Continue to monitor blood pressures. HTN  4. Left ventricular outflow tract obstruction Echocardiogram on 11/06/2018 showed EF 75%, Doppler parameters consistent with impaired relaxation, elevated LVEDP mild AR. Moderate MR. Mitral chordal SAM noted. LVOT was small. Peak gradient of 145 mmHg. Echo Aug 27, 2019 showed LVOT peak gradient 213 mmHg. EF 70-75% hyperdynamic mild concentric LVH, G1 DD, trivial MR, mild AR. Refer to structural heart disease/valve clinic for evaluation of LVOT obstruction, S.A.M., increased peak gradient, patient becoming more symptomatic with dyspnea and dizziness.  5. Paroxysmal atrial fibrillation (HCC) No recent evidence on last two EKGs.  1 episode of rapid  atrial fibrillation on previous cardiac monitor in June 2019.  Continue Eliquis 5 mg p.o. twice daily.   Medication Adjustments/Labs and Tests Ordered: Current medicines are reviewed at length with the patient today.  Concerns regarding medicines are outlined above.   Disposition: Follow-up with Dr. Domenic Polite or APP 3 months  Signed, Levell July, NP 10/29/2019 1:32 PM    [facility], [place], [place] [address] Phone: [phone]; Fax: [phone]

## 2019-10-28 NOTE — Patient Instructions (Addendum)
Medication Instructions:  *If you need a refill on your cardiac medications before your next appointment, please call your pharmacy*  Lab Work: If you have labs (blood work) drawn today and your tests are completely normal, you will receive your results only by: [place] Kitchen MyChart Message (if you have MyChart) OR . A paper copy in the mail If you have any lab test that is abnormal or we need to change your treatment, we will call you to review the results.  Follow-Up: At [facility], you and your health needs are our priority.  As part of our continuing mission to provide you with exceptional heart care, we have created designated Provider Care Teams.  These Care Teams include your primary Cardiologist (physician) and Advanced Practice Providers (APPs -  Physician Assistants and Nurse Practitioners) who all work together to provide you with the care you need, when you need it.  We recommend signing up for the patient portal called "MyChart".  Sign up information is provided on this After Visit Summary.  MyChart is used to connect with patients for Virtual Visits (Telemedicine).  Patients are able to view lab/test results, encounter notes, upcoming appointments, etc.  Non-urgent messages can be sent to your provider as well.   To learn more about what you can do with MyChart, go to [email or web address].    Your next appointment:   Your physician recommends that you schedule a follow-up appointment in: 3 MONTHS with Dr. Bryna Colander have been referred to [facility] -- Please schedule with Dr. Burt Knack or Dr. Angelena Form first available.

## 2019-10-28 NOTE — Telephone Encounter (Signed)
I still don't have labs. Alexandria French, can we request this again ASAP?

## 2019-10-28 NOTE — Telephone Encounter (Signed)
Routing to Susan. 

## 2019-10-29 ENCOUNTER — Other Ambulatory Visit: Payer: Self-pay | Admitting: *Deleted

## 2019-10-29 ENCOUNTER — Other Ambulatory Visit: Payer: Self-pay | Admitting: Family Medicine

## 2019-10-29 MED ORDER — AMLODIPINE BESYLATE 2.5 MG PO TABS: 2.5000 mg | ORAL_TABLET | Freq: Every morning | ORAL | 0 refills | Status: DC

## 2019-10-29 MED ORDER — APIXABAN 5 MG PO TABS: 5.0000 mg | ORAL_TABLET | Freq: Two times a day (BID) | ORAL | 6 refills | Status: DC

## 2019-10-29 MED ORDER — AMLODIPINE BESYLATE 2.5 MG PO TABS: 2.5000 mg | ORAL_TABLET | Freq: Every morning | ORAL | 3 refills | Status: DC

## 2019-10-29 NOTE — Telephone Encounter (Signed)
REQUESTED LABS ASAP FROM PCP

## 2019-10-29 NOTE — Telephone Encounter (Signed)
[facility] is charging $46 for 30 amlodipine 2.5 mg as out of pocket expense since [facility] has already filled it under her insurance. Advised that 3 pills will be sent [place] to pay out of pocket expense. Verbalized understanding.

## 2019-10-29 NOTE — Telephone Encounter (Signed)
Pt called stating her pharmacy [facility] here in [place] is closed for today and she's unable to pick up her Rx for the amLODipine (NORVASC) 2.5 MG tablet [[id number]]  And apixaban (ELIQUIS) 5 MG TABS tablet [[id number]]    Would [facility] [place]

## 2019-11-03 ENCOUNTER — Ambulatory Visit: Payer: Medicare PPO | Admitting: Cardiovascular Disease

## 2019-11-03 ENCOUNTER — Encounter: Payer: Self-pay | Admitting: *Deleted

## 2019-11-03 ENCOUNTER — Encounter: Payer: Self-pay | Admitting: Cardiovascular Disease

## 2019-11-03 ENCOUNTER — Other Ambulatory Visit: Payer: Self-pay

## 2019-11-03 VITALS — BP 111/62 | HR 58 | Ht 62.0 in | Wt 139.0 lb

## 2019-11-03 DIAGNOSIS — E7800 Pure hypercholesterolemia, unspecified: Secondary | ICD-10-CM | POA: Diagnosis not present

## 2019-11-03 DIAGNOSIS — I480 Paroxysmal atrial fibrillation: Secondary | ICD-10-CM

## 2019-11-03 DIAGNOSIS — R931 Abnormal findings on diagnostic imaging of heart and coronary circulation: Secondary | ICD-10-CM | POA: Diagnosis not present

## 2019-11-03 DIAGNOSIS — I10 Essential (primary) hypertension: Secondary | ICD-10-CM

## 2019-11-03 DIAGNOSIS — I495 Sick sinus syndrome: Secondary | ICD-10-CM

## 2019-11-03 DIAGNOSIS — I421 Obstructive hypertrophic cardiomyopathy: Secondary | ICD-10-CM

## 2019-11-03 MED ORDER — BISOPROLOL FUMARATE 5 MG PO TABS: 2.5000 mg | ORAL_TABLET | Freq: Every day | ORAL | 11 refills | Status: DC

## 2019-11-03 NOTE — Patient Instructions (Signed)
Medication Instructions:  STOP the Amlodipine START Bisoprolol 2.5 mg once daily (half a 5 mg tablet)  *If you need a refill on your cardiac medications before your next appointment, please call your pharmacy*   Lab Work: None ordered If you have labs (blood work) drawn today and your tests are completely normal, you will receive your results only by:  [place] (if you have MyChart) OR  A paper copy in the mail If you have any lab test that is abnormal or we need to change your treatment, we will call you to review the results.   Testing/Procedures: Bryn Gulling- Long Term Monitor Instructions   Your physician has requested you wear your ZIO patch monitor___7____days.   This is a single patch monitor.  [employer] supplies one patch monitor per enrollment.  Additional stickers are not available.   Please do not apply patch if you will be having a Nuclear Stress Test, Echocardiogram, Cardiac CT, MRI, or Chest Xray during the time frame you would be wearing the monitor. The patch cannot be worn during these tests.  You cannot remove and re-apply the ZIO XT patch monitor.   Your ZIO patch monitor will be sent USPS Priority mail from [facility] directly to your home address. The monitor may also be mailed to a PO BOX if home delivery is not available.   It may take 3-5 days to receive your monitor after you have been enrolled.   Once you have received you monitor, please review enclosed instructions.  Your monitor has already been registered assigning a specific monitor serial # to you.   Applying the monitor   Shave hair from upper left chest.   Hold abrader disc by orange tab.  Rub abrader in 40 strokes over left upper chest as indicated in your monitor instructions.   Clean area with 4 enclosed alcohol pads .  Use all pads to assure are is cleaned thoroughly.  Let dry.   Apply patch as indicated in monitor instructions.  Patch will be place under collarbone on left side of  chest with arrow pointing upward.   Rub patch adhesive wings for 2 minutes.Remove white label marked "1".  Remove white label marked "2".  Rub patch adhesive wings for 2 additional minutes.   While looking in a mirror, press and release button in center of patch.  A small green light will flash 3-4 times .  This will be your only indicator the monitor has been turned on.     Do not shower for the first 24 hours.  You may shower after the first 24 hours.   Press button if you feel a symptom. You will hear a small click.  Record Date, Time and Symptom in the Patient Log Book.   When you are ready to remove patch, follow instructions on last 2 pages of Patient Log Book.  Stick patch monitor onto last page of Patient Log Book.   Place Patient Log Book in [place] box.  Use locking tab on box and tape box closed securely.  The [employer] has [employer] on it.  Please place in mailbox as soon as possible.  Your physician should have your test results approximately 7 days after the monitor has been mailed back to [facility].   Call [place] at [phone] if you have questions regarding your ZIO XT patch monitor.  Call them immediately if you see an orange light blinking on your monitor.   If  your monitor falls off in less than 4 days contact our Monitor department at [phone].  If your monitor becomes loose or falls off after 4 days call [employer] at [phone] for suggestions on securing your monitor.     Follow-Up: At [facility], you and your health needs are our priority.  As part of our continuing mission to provide you with exceptional heart care, we have created designated Provider Care Teams.  These Care Teams include your primary Cardiologist (physician) and Advanced Practice Providers (APPs -  Physician Assistants and Nurse Practitioners) who all work together to provide you with the care you need, when you need it.  We recommend signing up for  the patient portal called "MyChart".  Sign up information is provided on this After Visit Summary.  MyChart is used to connect with patients for Virtual Visits (Telemedicine).  Patients are able to view lab/test results, encounter notes, upcoming appointments, etc.  Non-urgent messages can be sent to your provider as well.   To learn more about what you can do with MyChart, go to [email or web address].    Your next appointment:   Follow up with Dr. Sallyanne Kuster in early September

## 2019-11-03 NOTE — Progress Notes (Signed)
Cardiology Office Note:    Date:  11/04/2019   ID:  Alexandria French, DOB [birth year], MRN [id number]  PCP:  Rory Percy, MD  [facility] Cardiologist:  Orvil Faraone [place] Electrophysiologist:  None   Referring MD: Rory Percy, MD   Chief Complaint  Patient presents with  . Cardiomyopathy  Previously seen by Dr. Bronson Ing, establishing new cardiology follow-up  History of Present Illness:    Alexandria French is a 69 y.o. 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  She was recently seen in our [place] clinic by Levell July, NP and referred to the structural heart clinic.  We reviewed her echocardiograms in our structural heart meeting and decided she needed general cardiology evaluation first.  She has had a deterioration in her clinical status over the last year or so.  She has NYHA class II exertional dyspnea.  She will become short of breath making the bed, but not getting dressed.  She has difficulty climbing a full flight of stairs.  It seems that work that involves bending over causes much more severe shortness of breath then other types of activity.  Bending over also commonly causes severe dizziness.  She complains of fatigue and has palpitations.  At rest her heart rate is typically in the 50s and with physical activity rarely increases over the 70s, per her report.  Previous attempts at treatment with a beta-blocker was poorly tolerated due to extreme fatigue.  She has recently been diagnosed with moderately severe microcytic anemia and a hemoglobin of 8.7 (although looking over some old records she had a hemoglobin around 9-10 in 2010).  She has started iron supplements and has been referred to gastroenterology for endoscopy studies (although apparently there is a long wait for these in [facility]).  She has never experienced  full-blown syncope, but has had numerous presyncopal episodes, often associated with bending over or straining.  These have worsened over the last year.  She has never had full syncope or VT. There is no family history of HCM, VT, sudden death or CHF.  2 separate echocardiograms performed in August 2020 in May 2021 show findings consistent with hypertrophic obstructive cardiomyopathy, although the degree of left ventricular hypertrophy is relatively mild and appears to be concentric rather than asymmetrical.  Systolic anterior motion of the mitral valve with increased gradients across the left ventricular outflow tract.  These have been calculated at 145 mmHg and 213 mmHg respectively, although I am not sure that the estimation is accurate.  There seems to be a lot of contamination with mitral regurgitant flow on the Doppler signal.  There is however convincing evidence of gradients across the LVOT of at least 50 mmHg at rest.  On both studies the left ventricular systolic function is hyperdynamic with ejection fraction in excess of 75%.  There is no evidence of major abnormalities of the aortic valve itself.  Coronary CT angiogram performed in September 2020 showed a calcium score in the 85th percentile with no major coronary artery stenoses.  The coronary system is left dominant.  There is very mild bilateral carotid artery plaque with stenoses less than 40%.  Atrial fibrillation was reportedly detected on a previous cardiac monitor in June 2019.  The ventricular rate was over 178 bpm.  The episode was associated with dizziness and lightheadedness.  I am not sure it can accurately be categorized as atrial fibrillation since it  was so brief, but she definitely had at least paroxysmal atrial tachycardia.  Although frequent PVCs including ventricular bigeminy were recorded, there was no evidence of ventricular tachycardia..  She has a history of essential hypertension which is currently being managed with  high-dose lisinopril and a low-dose of amlodipine.  She is not receiving beta-blockers and is not receiving diuretics.  Hydrochlorothiazide was stopped after her echocardiogram in May 2019 suggested "dehydration" as a cause for her hyperdynamic left ventricular contraction and the outflow tract gradient.  Past Medical History:  Diagnosis Date  . A-fib ([place])   . CAD (coronary artery disease)   . Esophageal reflux    no meds  . Hypertension   . Other and unspecified hyperlipidemia     Past Surgical History:  Procedure Laterality Date  . ABDOMINAL HYSTERECTOMY  08/2008  . BLADDER REPAIR    . TONSILLECTOMY      Current Medications: Current Meds  Medication Sig  . apixaban (ELIQUIS) 5 MG TABS tablet Take 1 tablet (5 mg total) by mouth 2 (two) times daily.  . Calcium Carbonate-Vitamin D (CALTRATE 600+D) 600-400 MG-UNIT per tablet Take 1 tablet by mouth 2 (two) times daily.   . cetirizine (ZYRTEC) 10 MG tablet Take 10 mg by mouth daily.  . Coenzyme Q10 (COQ-10) 100 MG CAPS Take 1 capsule by mouth daily.  . ferrous sulfate 325 (65 FE) MG tablet Take 325 mg by mouth daily with breakfast.  . fluticasone (FLONASE) 50 MCG/ACT nasal spray Place 2 sprays into both nostrils daily.  Marland Kitchen lisinopril (ZESTRIL) 20 MG tablet TAKE 1 TABLET BY MOUTH TWICE DAILY (Patient taking differently: Take 10 mg by mouth at bedtime. & prn in mornibg)  . LORazepam (ATIVAN) 1 MG tablet Take 1 mg by mouth daily as needed for anxiety.  Marland Kitchen omega-3 acid ethyl esters (LOVAZA) 1 g capsule Take by mouth 2 (two) times daily.  . simvastatin (ZOCOR) 20 MG tablet Take 20 mg by mouth daily.  . vitamin B-12 (CYANOCOBALAMIN) 250 MCG tablet Take 250 mcg by mouth 2 (two) times a week.   . vitamin C (ASCORBIC ACID) 250 MG tablet Take 500 mg by mouth daily.  . [DISCONTINUED] amLODipine (NORVASC) 2.5 MG tablet Take 1 tablet (2.5 mg total) by mouth every morning.     Allergies:   Cefzil [cefprozil]   Social History   Socioeconomic  History  . Marital status: Married    Spouse name: Not on file  . Number of children: Not on file  . Years of education: Not on file  . Highest education level: Not on file  Occupational History  . Not on file  Tobacco Use  . Smoking status: Never Smoker  . Smokeless tobacco: Never Used  Substance and Sexual Activity  . Alcohol use: No    Alcohol/week: 0.0 standard drinks  . Drug use: No  . Sexual activity: Not on file  Other Topics Concern  . Not on file  Social History Narrative  . Not on file   Social Determinants of Health   Financial Resource Strain:   . Difficulty of Paying Living Expenses:   Food Insecurity:   . Worried About Charity fundraiser in the Last Year:   . Arboriculturist in the Last Year:   Transportation Needs:   . Film/video editor (Medical):   Marland Kitchen Lack of Transportation (Non-Medical):   Physical Activity:   . Days of Exercise per Week:   . Minutes of Exercise per Session:  Stress:   . Feeling of Stress :   Social Connections:   . Frequency of Communication with Friends and Family:   . Frequency of Social Gatherings with Friends and Family:   . Attends Religious Services:   . Active Member of Clubs or Organizations:   . Attends Archivist Meetings:   Marland Kitchen Marital Status:      Family History: The patient's family history includes Arthritis in an other family member; Heart disease in an other family member; Osteoporosis in an other family member. There is no history of Colon cancer or Colon polyps.  Father lived to 49 years old, has an older sister 51 years old who is healthy, had another sister died at age 64 of a lung infection.  1 child (son), healthy.  ROS:   Please see the history of present illness.    All other systems reviewed and are negative.  EKGs/Labs/Other Studies Reviewed:    The following studies were reviewed today: Echocardiogram 08/27/2019  1. LVOT peak gradient 213 mmHg. Left ventricular ejection fraction, by  estimation, is 70 to 75%. The left ventricle has hyperdynamic function. The left ventricle has no regional wall motion abnormalities. There is mild concentric left ventricular hypertrophy. Left ventricular diastolic parameters are consistent with Grade I diastolic dysfunction (impaired relaxation). Elevated left atrial pressure. 2. Right ventricular systolic function is normal. The right ventricular size is normal. There is normal pulmonary artery systolic pressure. 3. The mitral valve is grossly normal. Trivial mitral valve regurgitation. 4. The aortic valve is tricuspid. Aortic valve regurgitation is mild. No aortic stenosis is present. 5. The inferior vena cava is normal in size with greater than 50% respiratory variability, suggesting right atrial pressure of 3 mmHg.   Echocardiogram 11/26/2018: 1. The left ventricle has a visually estimated ejection fraction of 75%.  The cavity size was normal. Left ventricular diastolic Doppler parameters  are consistent with impaired relaxation. Elevated left ventricular  end-diastolic pressure No evidence of  left ventricular regional wall motion abnormalities.  2. The right ventricle has normal systolic function. The cavity was  normal. There is no increase in right ventricular wall thickness.  3. The aortic valve is tricuspid. Mild thickening of the aortic valve.  Aortic valve regurgitation is mild by color flow Doppler. Mild aortic  annular calcification noted.  4. The mitral valve is abnormal. Moderate thickening of the mitral valve  leaflet. Mitral valve regurgitation is moderate by color flow Doppler.  5. Mitral chordal SAM is noted. LVOT is small. Peak gradient 145 mmHg.  6. The tricuspid valve is grossly normal. Tricuspid valve regurgitation  is moderate.  7. The aorta is normal unless otherwise noted.   Coronary CT angiography 12/03/2018: FINDINGS: Coronary calcium score: The patient's coronary artery calcium score is  149, which places the patient in the 85th percentile. Coronary arteries: Normal coronary origins. Left dominance Right Coronary Artery: Small, non-dominant vessel which gives off a larger AV nodal branch. No obstructive disease. Left Main Coronary Artery: Prominent left coronary cusp, but not anuerymsal. No stenosis. Gives off larger LAD and LCx arteries. Left Anterior Descending Coronary Artery: Calcified proximal vessel with mild 25-49% (CADRADS2) mixed stenosis. Gives off several small diagonal branches. Ramus Intermedius Artery: Small branch without significant stenosis.  Left Circumflex Artery: Dominant vessel that gives off a small PDA artery. Larger caliber with minimal mixed 1-24% mid-vessel stenosis (CADRADS1).  Aorta: Normal size, 28 mm at the mid ascending aorta (level of the PA bifurcation) measured double oblique. Atherosclerosis.  No dissection. Aortic Valve: Trileaflet. Calcification of the annulus.  Other findings: Thickening of the aortic and mitral valve leaflets. Normal biatrial size Normal pulmonary vein drainage into the left atrium. Normal left atrial appendage without a thrombus. Normal size of the pulmonary artery.  IMPRESSION: 1. Mild, non-obstructive CAD primarily of the LAD, CADRADS = 2. 2. Coronary calcium score of 149. This was 85th percentile for age and sex matched control. 3. Normal coronary origin with left dominance. 4. Thickening of the aortic and mitral valve leaflets  MCOT monitor 07/14/2017  Sinus rhythm with frequent PVCs. There was one episode of rapid atrial fibrillation.   EKG:  EKG is not ordered today.  The ekg ordered today demonstrates sinus rhythm with frequent PVCs and fusion beats, LBBB  Recent Labs: No results found for requested labs within last 8760 hours.  10/07/2019 Creatinine 0.86, glucose 92, potassium 4.0, normal liver function test Hemoglobin 8.7 with low MCV TSH 3.600 Recent Lipid Panel No results found for: CHOL,  TRIG, HDL, CHOLHDL, VLDL, LDLCALC, LDLDIRECT 10/07/2019 Total cholesterol 171, HDL 56, LDL 99, triglycerides 145 Physical Exam:    VS:  BP 111/62   Pulse (!) 58   Ht 5\' 2"  (1.575 m)   Wt 139 lb (63 kg)   SpO2 98%   BMI 25.42 kg/m     Wt Readings from Last 3 Encounters:  11/03/19 139 lb (63 kg)  10/28/19 141 lb (64 kg)  08/17/19 140 lb (63.5 kg)     GEN:  Well nourished, well developed in no acute distress HEENT: Normal NECK: No JVD; No carotid bruits LYMPHATICS: No lymphadenopathy CARDIAC: RRR, paradoxically split second heart sound, 2/6 late peaking systolic ejection murmur is heard both at the aortic focus and the left lower sternal border, 2/6 systolic murmur at the apex as well, diminishes with handgrip and is markedly accentuated by Valsalva maneuver, no murmurs, rubs, gallops.  Also noticeable post ectopic potentiation of the murmur. RESPIRATORY:  Clear to auscultation without rales, wheezing or rhonchi  ABDOMEN: Soft, non-tender, non-distended MUSCULOSKELETAL:  No edema; No deformity  SKIN: Warm and dry NEUROLOGIC:  Alert and oriented x 3 PSYCHIATRIC:  Normal affect   ASSESSMENT:    1. HOCM (hypertrophic obstructive cardiomyopathy) ([place])   2. SSS (sick sinus syndrome) ([place])   3. Essential hypertension   4. Paroxysmal atrial fibrillation ([facility])   5. Elevated coronary artery calcium score   6. Hypercholesterolemia    PLAN:    In order of problems listed above:  1. HOCM: Both her physical exam and her echocardiograms are highly suggestive of hypertrophic obstructive cardiomyopathy.  Treatment with vasodilators may be disadvantageous.  Stop amlodipine.  Ideally would be on beta-blockers, but her bradycardia may limit their use.  We will try very low-dose of bisoprolol 2.5 mg once daily (previously intolerant of metoprolol).  Reviewed the typical genetic cause of this disorder and the fact that she must have a very benign variant since symptoms only began at advanced age  and there is no family history of serious adverse outcomes. 2. SSS: Appears to have some symptoms of chronotropic incompetence and definitely has sinus bradycardia at rest.  If she does not tolerate low-dose beta-blockers may have to consider pacemaker implantation.  We will check a 7-day monitor.  Briefly discussed pacemaker implantation purpose, technique and possible complications.  She also has evidence of intraventricular conduction abnormalities and may eventually need a pacemaker for AV block.  As an added bonus, committed ventricular pacing may help with  the left ventricular outflow tract obstruction. 3. HTN: Prefer management with beta-blockers if possible.  Avoid potent vasodilators and excessive diuretic use. 4. AFib: I am not entirely sure of the accuracy of this diagnosis, but she is at risk for developing atrial fibrillation due to the underlying structural heart disease.  She definitely has paroxysmal atrial tachycardia.  Continue anticoagulation.  Will have him opportunities to assess for recurrence of atrial fibrillation on her monitor and possibly on her future pacemaker. 5. Elevated coronary calcium score:  No evidence of ischemia by nuclear stress testing.  Focus on risk factor modification. 6. HLP: Target LDL cholesterol less than 70.  On statin, most recent LDL was 99.  May need to switch to a more potent agent such as atorvastatin or rosuvastatin.  Not on aspirin due to full anticoagulation   Medication Adjustments/Labs and Tests Ordered: Current medicines are reviewed at length with the patient today.  Concerns regarding medicines are outlined above.  Orders Placed This Encounter  Procedures  . LONG TERM MONITOR (3-14 DAYS)   Meds ordered this encounter  Medications  . bisoprolol (ZEBETA) 5 MG tablet    Sig: Take 0.5 tablets (2.5 mg total) by mouth daily.    Dispense:  15 tablet    Refill:  11    Patient Instructions  Medication Instructions:  STOP the  Amlodipine START Bisoprolol 2.5 mg once daily (half a 5 mg tablet)  *If you need a refill on your cardiac medications before your next appointment, please call your pharmacy*   Lab Work: None ordered If you have labs (blood work) drawn today and your tests are completely normal, you will receive your results only by: Marland Kitchen MyChart Message (if you have MyChart) OR . A paper copy in the mail If you have any lab test that is abnormal or we need to change your treatment, we will call you to review the results.   Testing/Procedures: [place] [place]- Long Term Monitor Instructions   Your physician has requested you wear your ZIO patch monitor___7____days.   This is a single patch monitor.  [employer] supplies one patch monitor per enrollment.  Additional stickers are not available.   Please do not apply patch if you will be having a Nuclear Stress Test, Echocardiogram, Cardiac CT, MRI, or Chest Xray during the time frame you would be wearing the monitor. The patch cannot be worn during these tests.  You cannot remove and re-apply the ZIO XT patch monitor.   Your ZIO patch monitor will be sent USPS Priority mail from [facility] directly to your home address. The monitor may also be mailed to a PO BOX if home delivery is not available.   It may take 3-5 days to receive your monitor after you have been enrolled.   Once you have received you monitor, please review enclosed instructions.  Your monitor has already been registered assigning a specific monitor serial # to you.   Applying the monitor   Shave hair from upper left chest.   Hold abrader disc by orange tab.  Rub abrader in 40 strokes over left upper chest as indicated in your monitor instructions.   Clean area with 4 enclosed alcohol pads .  Use all pads to assure are is cleaned thoroughly.  Let dry.   Apply patch as indicated in monitor instructions.  Patch will be place under collarbone on left side of chest with arrow pointing upward.    Rub patch adhesive wings for 2 minutes.Remove white label marked "  1".  Remove white label marked "2".  Rub patch adhesive wings for 2 additional minutes.   While looking in a mirror, press and release button in center of patch.  A small green light will flash 3-4 times .  This will be your only indicator the monitor has been turned on.     Do not shower for the first 24 hours.  You may shower after the first 24 hours.   Press button if you feel a symptom. You will hear a small click.  Record Date, Time and Symptom in the Patient Log Book.   When you are ready to remove patch, follow instructions on last 2 pages of Patient Log Book.  Stick patch monitor onto last page of Patient Log Book.   Place Patient Log Book in [place] box.  Use locking tab on box and tape box closed securely.  The [employer] has [employer] on it.  Please place in mailbox as soon as possible.  Your physician should have your test results approximately 7 days after the monitor has been mailed back to [facility]   Call [facility] at [phone] if you have questions regarding your ZIO XT patch monitor.  Call them immediately if you see an orange light blinking on your monitor.   If your monitor falls off in less than 4 days contact our Monitor department at [phone].  If your monitor becomes loose or falls off after 4 days call [employer] at [phone] for suggestions on securing your monitor.     Follow-Up: At [facility], you and your health needs are our priority.  As part of our continuing mission to provide you with exceptional heart care, we have created designated Provider Care Teams.  These Care Teams include your primary Cardiologist (physician) and Advanced Practice Providers (APPs -  Physician Assistants and Nurse Practitioners) who all work together to provide you with the care you need, when you need it.  We recommend signing up for the patient portal called  "MyChart".  Sign up information is provided on this After Visit Summary.  MyChart is used to connect with patients for Virtual Visits (Telemedicine).  Patients are able to view lab/test results, encounter notes, upcoming appointments, etc.  Non-urgent messages can be sent to your provider as well.   To learn more about what you can do with MyChart, go to [email or web address].    Your next appointment:   Follow up with Dr. Sallyanne Kuster in early September     Signed, Sanda Klein, MD  11/04/2019 9:32 AM    [place]

## 2019-11-03 NOTE — Progress Notes (Signed)
[name], female   DOB: Oct 01, 1950, 69 y.o.   MRN: [id number] Patient enrolled for [employer] to ship a 7 day ZIO XT long term holter monitor to her home.

## 2019-11-03 NOTE — Telephone Encounter (Signed)
I have received labs dated October 07, 2019. We have requested this several times and have just received:  Hgb 8.7. Hgb 12 range 04/2018. Ferritin markedly low at 7. Iron sats low at 4. Microcytic indices.   Creatinine 0.86.    She was seen in May 2021 for routine colonoscopy at that point.  Chronic rectal bleeding was not new. IDA is new and needs timely evaluation.  Diagnostic colonoscopy WITH EGD now recommended with Propofol, ASA III. Hold Eliquis 48 hours prior.   I see that she may be having this done elsewhere. Please verify with patient as it looks like PCP referred elsewhere.

## 2019-11-04 NOTE — Telephone Encounter (Signed)
Lmom, waiting on a return call.  

## 2019-11-05 NOTE — Telephone Encounter (Signed)
Pt had apt with her PCP and due to the Anemia on labs, pts PCP wanted to get her in with someone soon. Pt is having procedure done at another office since our office wasn't able to get her in. PCP was concerned with possible blood loss.

## 2019-11-06 ENCOUNTER — Ambulatory Visit (INDEPENDENT_AMBULATORY_CARE_PROVIDER_SITE_OTHER): Payer: Medicare PPO

## 2019-11-06 DIAGNOSIS — I495 Sick sinus syndrome: Secondary | ICD-10-CM

## 2019-11-16 ENCOUNTER — Telehealth: Payer: Self-pay | Admitting: *Deleted

## 2019-11-16 DIAGNOSIS — K625 Hemorrhage of anus and rectum: Secondary | ICD-10-CM | POA: Diagnosis not present

## 2019-11-16 DIAGNOSIS — D500 Iron deficiency anemia secondary to blood loss (chronic): Secondary | ICD-10-CM | POA: Diagnosis not present

## 2019-11-16 DIAGNOSIS — R195 Other fecal abnormalities: Secondary | ICD-10-CM | POA: Diagnosis not present

## 2019-11-16 NOTE — Telephone Encounter (Signed)
   [facility] Pre-operative Risk Assessment     Request for surgical clearance:  1. What type of surgery is being performed? colonoscopy   2. When is this surgery scheduled? TBD   3. What type of clearance is required (medical clearance vs. Pharmacy clearance to hold med vs. Both)? both  4. Are there any medications that need to be held prior to surgery and how long? Eliquis x 2 days   5. Practice name and name of physician performing surgery? [facility] Dr. Rocco Serene   6. What is the office phone number? [phone]   7.   What is the office fax number? [phone]  8.   Anesthesia type (None, local, MAC, general) ?    Alexandria French Alexandria French 11/16/2019, 4:35 PM  _________________________________________________________________   (provider comments below)

## 2019-11-17 NOTE — Telephone Encounter (Signed)
Patient with diagnosis of afib on Eliquis for anticoagulation.    Procedure: colonoscopy  Date of procedure: TBD  CHADS2-VASc score of 4 (age, sex, HTN, CAD)  CrCl 88mL/min Platelet count 178K  Per office protocol, patient can hold Eliquis for 2 days prior to procedure as requested.

## 2019-11-17 NOTE — Telephone Encounter (Signed)
Patient recently seen in structural heart clinic. Awaiting results of cardiac event monitor. We will be better able to provide cardiac clearance/risk assessment after study evaluation.

## 2019-11-19 ENCOUNTER — Telehealth: Payer: Self-pay | Admitting: Cardiovascular Disease

## 2019-11-19 NOTE — Telephone Encounter (Signed)
New Message  Pt c/o BP issue: STAT if pt c/o blurred vision, one-sided weakness or slurred speech  1. What are your last 5 BP readings? 181/72 HR 53, 164/71 HR 50, 136/65 HR 50  2. Are you having any other symptoms (ex. Dizziness, headache, blurred vision, passed out)? No symptoms  3. What is your BP issue? Blood pressure has been elevated in the mornings.

## 2019-11-19 NOTE — Telephone Encounter (Signed)
Spoke to patient . She states since the change of medication  She feel better.  but she notice her blood pressure is elevated I the mornings  And lower towards bedtime.  She is taking lisinopril 10 mg and Bisoprolol 2.5 mg in the morning at breakfast time . Lisinopril 10 mg at bedtime.   blood pressure range  Today 181/72  pulse 53 at 8:12 am     164/71  50     9:57 am            Yesterday  166/56           7:47am         156/65                    10:56 am         139/43                   8:30 pm         136/65                   10:00 pm Patient is aware will defer to Dr Sallyanne Kuster. She aware  He is out of the office.  Informed patient if no response by Tuesday please call office back  patient verbalized understanding

## 2019-11-22 NOTE — Telephone Encounter (Signed)
Please increase the lisinopril bedtime dose only to 20 mg.  Continue taking 10 mg lisinopril in the mornings and continue the bisoprolol.

## 2019-11-22 NOTE — Telephone Encounter (Signed)
Please leve things as they are until th 9/7 appointment and bring her BP log to the appt. Thanks

## 2019-11-22 NOTE — Telephone Encounter (Signed)
Returned the call to the patient. She stated that she started taking Lisinopril 20 mg bid last Friday. She takes Bisoprolol 2.5 mg in the afternoon.  She stated that her blood pressure has still been elevated but she feels better than she has in a long time, no weakness or dizziness.  Saturday: 133/53  HR 48 when she first woke up, no medications 160/49 HR 56 after Lisinopril 20 mg  Lunch 151/62 HR 44 after Bisoprolol 2.5 mg  Evening 127/52  HR 53 before Lisinopril  163/75  HR 52 at 11 pm  She stated that she has been limiting her salt and has been trying to keep to a healthy diet.  She has an appointment on 9/7 with Dr. Sallyanne Kuster.

## 2019-11-22 NOTE — Telephone Encounter (Signed)
Patient has been made aware and verbalized her understanding.  

## 2019-11-23 NOTE — Telephone Encounter (Signed)
This patient was supposed to have a 7 day monitor placed according to Dr Croitoru's office note 11/03/2019- I don't see that it was done yet. Can you check on this?  The patient also has an appointment with Dr Sallyanne Kuster on 9/7.  Pre op clearance can be done then.  I will remove from the pool.  Kerin Ransom PA-C 11/23/2019 9:01 AM

## 2019-11-23 NOTE — Telephone Encounter (Signed)
I have reach out to Alexandria French who stated [employer] has received her monitor. They are processing it, but final results have not yet been posted.

## 2019-11-23 NOTE — Telephone Encounter (Signed)
Call and spoke with pt, she stated that she did received and wear monitor, monitor was also mailed back, I will reach out to Alexandria French to see if monitor have been received and get result updated in epic

## 2019-11-25 DIAGNOSIS — R002 Palpitations: Secondary | ICD-10-CM | POA: Diagnosis not present

## 2019-12-07 ENCOUNTER — Telehealth: Payer: Self-pay | Admitting: Family Medicine

## 2019-12-07 ENCOUNTER — Other Ambulatory Visit: Payer: Self-pay

## 2019-12-07 ENCOUNTER — Ambulatory Visit: Payer: Medicare PPO | Admitting: Cardiovascular Disease

## 2019-12-07 ENCOUNTER — Encounter: Payer: Self-pay | Admitting: Cardiovascular Disease

## 2019-12-07 VITALS — BP 145/62 | HR 49 | Ht 62.0 in | Wt 138.8 lb

## 2019-12-07 DIAGNOSIS — I480 Paroxysmal atrial fibrillation: Secondary | ICD-10-CM | POA: Diagnosis not present

## 2019-12-07 DIAGNOSIS — Z7901 Long term (current) use of anticoagulants: Secondary | ICD-10-CM | POA: Diagnosis not present

## 2019-12-07 DIAGNOSIS — R931 Abnormal findings on diagnostic imaging of heart and coronary circulation: Secondary | ICD-10-CM

## 2019-12-07 DIAGNOSIS — E7800 Pure hypercholesterolemia, unspecified: Secondary | ICD-10-CM

## 2019-12-07 DIAGNOSIS — I10 Essential (primary) hypertension: Secondary | ICD-10-CM

## 2019-12-07 DIAGNOSIS — I421 Obstructive hypertrophic cardiomyopathy: Secondary | ICD-10-CM

## 2019-12-07 DIAGNOSIS — I495 Sick sinus syndrome: Secondary | ICD-10-CM

## 2019-12-07 DIAGNOSIS — D649 Anemia, unspecified: Secondary | ICD-10-CM | POA: Diagnosis not present

## 2019-12-07 MED ORDER — LISINOPRIL 20 MG PO TABS
20.0000 mg | ORAL_TABLET | Freq: Two times a day (BID) | ORAL | 6 refills | Status: DC
Start: 2019-12-07 — End: 2020-05-30

## 2019-12-07 NOTE — Patient Instructions (Signed)
Medication Instructions:  No changes *If you need a refill on your cardiac medications before your next appointment, please call your pharmacy*   Lab Work: Your provider would like for you to have the following labs today: Hemoglobin and Hemacrit  If you have labs (blood work) drawn today and your tests are completely normal, you will receive your results only by: Marland Kitchen MyChart Message (if you have MyChart) OR . A paper copy in the mail If you have any lab test that is abnormal or we need to change your treatment, we will call you to review the results.   Testing/Procedures: None ordered   Follow-Up: At [facility], you and your health needs are our priority.  As part of our continuing mission to provide you with exceptional heart care, we have created designated Provider Care Teams.  These Care Teams include your primary Cardiologist (physician) and Advanced Practice Providers (APPs -  Physician Assistants and Nurse Practitioners) who all work together to provide you with the care you need, when you need it.  We recommend signing up for the patient portal called "MyChart".  Sign up information is provided on this After Visit Summary.  MyChart is used to connect with patients for Virtual Visits (Telemedicine).  Patients are able to view lab/test results, encounter notes, upcoming appointments, etc.  Non-urgent messages can be sent to your provider as well.   To learn more about what you can do with MyChart, go to [email or web address].    Your next appointment:   4 month(s)  The format for your next appointment:   In Person  Provider:   You may see Sanda Klein, MD or one of the following Advanced Practice Providers on your designated Care Team:    Almyra Deforest, PA-C  Fabian Sharp, PA-C or   Roby Lofts, [place]

## 2019-12-07 NOTE — Telephone Encounter (Signed)
     1. Which medications need to be refilled? (please list name of each medication and dose if known)     Lisinopril 20 mg   2. Which pharmacy/location (including street and city if local pharmacy) is medication to be sent to? [facility]  [place], [place]   3. Do they need a 30 day or 90 day supply?   30   Patient has requested to switch her Pharmacy to [place], [place]

## 2019-12-07 NOTE — Progress Notes (Signed)
Cardiology Office Note:    Date:  12/07/2019   ID:  Alexandria French, DOB Nov 05, 1950, MRN [id number]  PCP:  Rory Percy, MD  [facility] Cardiologist:  Zariana Strub [place] Electrophysiologist:  None   Referring MD: Rory Percy, MD   No chief complaint on file. Previously seen by Dr. Bronson Ing, establishing new cardiology follow-up  History of Present Illness:    Alexandria French is a 69 y.o. 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  After discontinuation of her diuretic and amlodipine has been marked improvement in her complaints of near syncope and exertional dyspnea.  Started on a very low-dose of bisoprolol due to baseline sinus bradycardia.  She is tolerating this well although her heart rate is in the 40s.  She only becomes dizzy if she bends over and stands up too fast.  She has not had exertional dyspnea, orthopnea, PND, chest pain at rest or with activity, full-blown syncope, lower extremity edema, intermittent claudication or focal neurological complaints.  Her fatigue is greatly improved despite the bradycardia.  Palpitations have diminished in frequency.  She has been taking an iron supplement for about 3 months now and has not yet had a follow-up CBC.  She is scheduled to undergo a colonoscopy with Dr. Rocco Serene at [facility] and inquires about stopping the Eliquis for this procedure.  Her blood pressure has been relatively high, although the systolic blood pressure is often in the 130s, it often reaches the 150s.  She has never had full syncope or VT. There is no family history of HCM, VT, sudden death or CHF.  2 separate echocardiograms performed in August 2020 in May 2021 show findings consistent with hypertrophic  obstructive cardiomyopathy, although the degree of left ventricular hypertrophy is relatively mild and appears to be concentric rather than asymmetrical.  Systolic anterior motion of the mitral valve with increased gradients across the left ventricular outflow tract.  These have been calculated at 145 mmHg and 213 mmHg respectively, although I am not sure that the estimation is accurate.  There seems to be a lot of contamination with mitral regurgitant flow on the Doppler signal.  There is however convincing evidence of gradients across the LVOT of at least 50 mmHg at rest.  On both studies the left ventricular systolic function is hyperdynamic with ejection fraction in excess of 75%.  There is no evidence of major abnormalities of the aortic valve itself.  Coronary CT angiogram performed in September 2020 showed a calcium score in the 85th percentile with no major coronary artery stenoses.  The coronary system is left dominant.  There is very mild bilateral carotid artery plaque with stenoses less than 40%.  Atrial fibrillation was reportedly detected on a previous cardiac monitor in June 2019.  The ventricular rate was over 178 bpm.  The episode was associated with dizziness and lightheadedness.  I am not sure it can accurately be categorized as atrial fibrillation since it was so brief, but she definitely had at least paroxysmal atrial tachycardia.  Although frequent PVCs including ventricular bigeminy were recorded, there was no evidence of ventricular tachycardia..  She has a history of essential hypertension which is currently being managed with high-dose lisinopril and a low-dose of bisoprolol.  Past Medical History:  Diagnosis Date  . A-fib ([place])   . CAD (coronary  artery disease)   . Esophageal reflux    no meds  . Hypertension   . Other and unspecified hyperlipidemia     Past Surgical History:  Procedure Laterality Date  . ABDOMINAL HYSTERECTOMY  08/2008  . BLADDER REPAIR    .  TONSILLECTOMY      Current Medications: Current Meds  Medication Sig  . apixaban (ELIQUIS) 5 MG TABS tablet Take 1 tablet (5 mg total) by mouth 2 (two) times daily.  . bisoprolol (ZEBETA) 5 MG tablet Take 0.5 tablets (2.5 mg total) by mouth daily.  . Calcium Carbonate-Vitamin D (CALTRATE 600+D) 600-400 MG-UNIT per tablet Take 1 tablet by mouth 2 (two) times daily.   . cetirizine (ZYRTEC) 10 MG tablet Take 10 mg by mouth daily.  . Coenzyme Q10 (COQ-10) 100 MG CAPS Take 1 capsule by mouth daily.  . ferrous sulfate 325 (65 FE) MG tablet Take 325 mg by mouth daily with breakfast.  . fluticasone (FLONASE) 50 MCG/ACT nasal spray Place 2 sprays into both nostrils daily.  Marland Kitchen lisinopril (ZESTRIL) 20 MG tablet Take 1 tablet (20 mg total) by mouth 2 (two) times daily.  Marland Kitchen LORazepam (ATIVAN) 1 MG tablet Take 1 mg by mouth daily as needed for anxiety.  Marland Kitchen omega-3 acid ethyl esters (LOVAZA) 1 g capsule Take by mouth 2 (two) times daily.  . simvastatin (ZOCOR) 20 MG tablet Take 20 mg by mouth daily.  . vitamin B-12 (CYANOCOBALAMIN) 250 MCG tablet Take 250 mcg by mouth 2 (two) times a week.   . vitamin C (ASCORBIC ACID) 250 MG tablet Take 500 mg by mouth daily.     Allergies:   Cefzil [cefprozil]   Social History   Socioeconomic History  . Marital status: Married    Spouse name: Not on file  . Number of children: Not on file  . Years of education: Not on file  . Highest education level: Not on file  Occupational History  . Not on file  Tobacco Use  . Smoking status: Never Smoker  . Smokeless tobacco: Never Used  Substance and Sexual Activity  . Alcohol use: No    Alcohol/week: 0.0 standard drinks  . Drug use: No  . Sexual activity: Not on file  Other Topics Concern  . Not on file  Social History Narrative  . Not on file   Social Determinants of Health   Financial Resource Strain:   . Difficulty of Paying Living Expenses: Not on file  Food Insecurity:   . Worried About Sales executive in the Last Year: Not on file  . Ran Out of Food in the Last Year: Not on file  Transportation Needs:   . Lack of Transportation (Medical): Not on file  . Lack of Transportation (Non-Medical): Not on file  Physical Activity:   . Days of Exercise per Week: Not on file  . Minutes of Exercise per Session: Not on file  Stress:   . Feeling of Stress : Not on file  Social Connections:   . Frequency of Communication with Friends and Family: Not on file  . Frequency of Social Gatherings with Friends and Family: Not on file  . Attends Religious Services: Not on file  . Active Member of Clubs or Organizations: Not on file  . Attends Archivist Meetings: Not on file  . Marital Status: Not on file     Family History: The patient's family history includes Arthritis in an other family member; Heart disease in an  other family member; Osteoporosis in an other family member. There is no history of Colon cancer or Colon polyps.  Father lived to 49 years old, has an older sister 42 years old who is healthy, had another sister died at age 50 of a lung infection.  1 child (son), healthy.  ROS:   Please see the history of present illness. All other systems are reviewed and are negative.    EKGs/Labs/Other Studies Reviewed:    The following studies were reviewed today: Echocardiogram 08/27/2019  1. LVOT peak gradient 213 mmHg. Left ventricular ejection fraction, by estimation, is 70 to 75%. The left ventricle has hyperdynamic function. The left ventricle has no regional wall motion abnormalities. There is mild concentric left ventricular hypertrophy. Left ventricular diastolic parameters are consistent with Grade I diastolic dysfunction (impaired relaxation). Elevated left atrial pressure. 2. Right ventricular systolic function is normal. The right ventricular size is normal. There is normal pulmonary artery systolic pressure. 3. The mitral valve is grossly normal. Trivial mitral valve  regurgitation. 4. The aortic valve is tricuspid. Aortic valve regurgitation is mild. No aortic stenosis is present. 5. The inferior vena cava is normal in size with greater than 50% respiratory variability, suggesting right atrial pressure of 3 mmHg.   Echocardiogram 11/26/2018: 1. The left ventricle has a visually estimated ejection fraction of 75%.  The cavity size was normal. Left ventricular diastolic Doppler parameters  are consistent with impaired relaxation. Elevated left ventricular  end-diastolic pressure No evidence of  left ventricular regional wall motion abnormalities.  2. The right ventricle has normal systolic function. The cavity was  normal. There is no increase in right ventricular wall thickness.  3. The aortic valve is tricuspid. Mild thickening of the aortic valve.  Aortic valve regurgitation is mild by color flow Doppler. Mild aortic  annular calcification noted.  4. The mitral valve is abnormal. Moderate thickening of the mitral valve  leaflet. Mitral valve regurgitation is moderate by color flow Doppler.  5. Mitral chordal SAM is noted. LVOT is small. Peak gradient 145 mmHg.  6. The tricuspid valve is grossly normal. Tricuspid valve regurgitation  is moderate.  7. The aorta is normal unless otherwise noted.   Coronary CT angiography 12/03/2018: FINDINGS: Coronary calcium score: The patient's coronary artery calcium score is 149, which places the patient in the 85th percentile. Coronary arteries: Normal coronary origins. Left dominance Right Coronary Artery: Small, non-dominant vessel which gives off a larger AV nodal branch. No obstructive disease. Left Main Coronary Artery: Prominent left coronary cusp, but not anuerymsal. No stenosis. Gives off larger LAD and LCx arteries. Left Anterior Descending Coronary Artery: Calcified proximal vessel with mild 25-49% (CADRADS2) mixed stenosis. Gives off several small diagonal branches. Ramus Intermedius  Artery: Small branch without significant stenosis.  Left Circumflex Artery: Dominant vessel that gives off a small PDA artery. Larger caliber with minimal mixed 1-24% mid-vessel stenosis (CADRADS1).  Aorta: Normal size, 28 mm at the mid ascending aorta (level of the PA bifurcation) measured double oblique. Atherosclerosis. No dissection. Aortic Valve: Trileaflet. Calcification of the annulus.  Other findings: Thickening of the aortic and mitral valve leaflets. Normal biatrial size Normal pulmonary vein drainage into the left atrium. Normal left atrial appendage without a thrombus. Normal size of the pulmonary artery.  IMPRESSION: 1. Mild, non-obstructive CAD primarily of the LAD, CADRADS = 2. 2. Coronary calcium score of 149. This was 85th percentile for age and sex matched control. 3. Normal coronary origin with left dominance. 4. Thickening of  the aortic and mitral valve leaflets  MCOT monitor 07/14/2017  Sinus rhythm with frequent PVCs. There was one episode of rapid atrial fibrillation.   EKG:  EKG is ordered today and shows sinus bradycardia, left atrial abnormality and left bundle branch block. Recent Labs: No results found for requested labs within last 8760 hours.  10/07/2019 Creatinine 0.86, glucose 92, potassium 4.0, normal liver function test Hemoglobin 8.7 with low MCV TSH 3.600 Recent Lipid Panel No results found for: CHOL, TRIG, HDL, CHOLHDL, VLDL, LDLCALC, LDLDIRECT 10/07/2019 Total cholesterol 171, HDL 56, LDL 99, triglycerides 145 Physical Exam:    VS:  BP (!) 145/62   Pulse (!) 49   Ht 5\' 2"  (1.575 m)   Wt 138 lb 12.8 oz (63 kg)   SpO2 100%   BMI 25.39 kg/m     Wt Readings from Last 3 Encounters:  12/07/19 138 lb 12.8 oz (63 kg)  11/03/19 139 lb (63 kg)  10/28/19 141 lb (64 kg)      General: Alert, oriented x3, no distress, appears well Head: no evidence of trauma, PERRL, EOMI, no exophtalmos or lid lag, no myxedema, no xanthelasma; normal ears,  nose and oropharynx Neck: normal jugular venous pulsations and no hepatojugular reflux; brisk carotid pulses without delay and no carotid bruits Chest: clear to auscultation, no signs of consolidation by percussion or palpation, normal fremitus, symmetrical and full respiratory excursions Cardiovascular: normal position and quality of the apical impulse, regular rhythm, normal first and second heart sounds, no diastolic murmurs, rubs or gallops.  There is no systolic murmur at rest, but this is readily inducible with a Valsalva maneuver. Abdomen: no tenderness or distention, no masses by palpation, no abnormal pulsatility or arterial bruits, normal bowel sounds, no hepatosplenomegaly Extremities: no clubbing, cyanosis or edema; 2+ radial, ulnar and brachial pulses bilaterally; 2+ right femoral, posterior tibial and dorsalis pedis pulses; 2+ left femoral, posterior tibial and dorsalis pedis pulses; no subclavian or femoral bruits Neurological: grossly nonfocal Psych: Normal mood and affect   ASSESSMENT:    1. HOCM (hypertrophic obstructive cardiomyopathy) ([place])   2. SSS (sick sinus syndrome) ([place])   3. Essential hypertension   4. Paroxysmal atrial fibrillation ([facility])   5. Long term (current) use of anticoagulants   6. Anemia, unspecified type   7. Elevated coronary artery calcium score   8. Hypercholesterolemia    PLAN:    In order of problems listed above:  1. HOCM: Both her physical exam and her echocardiograms are highly suggestive of hypertrophic obstructive cardiomyopathy.  Try to avoid diuretics and potent vasodilators.  Only can take a low-dose of beta-blocker due to underlying bradycardia. 2. SSS: Significant sinus bradycardia at rest.  Mean heart rate was 55 bpm on a 7-day monitor and the maximum heart rate achieved was only 93 bpm (60% of maximum predicted heart rate for age), consistent with chronotropic incompetence.  She had frequent PVCs (6% of all complexes), but did not have  VT on the monitor.  She had rare episodes of nonsustained atrial tachycardia.  She has multiple indications for pacemaker implantation, but is not particularly symptomatic and would like to defer invasive procedures at this time.  Ventricular pacing may have the fringe benefit of reducing the outflow tract gradient, but more importantly will allow the use of higher doses of beta-blocker.  Reevaluate in a few months.  Call sooner if she develops syncope or worsening exercise tolerance. 3. HTN: Blood pressure control is not ideal.  She is on maximum  dose of lisinopril and cannot take higher doses of beta-blockers due to bradycardia.  Trying to avoid diuretics and potent vasodilators. 4. AFib: I am not confident that she ever had paroxysmal atrial fibrillation, but she definitely has episodes of paroxysmal atrial tachycardia and due to her underlying structural heart disease is at high risk for developing atrial fibrillation.  So far she is tolerating anticoagulation well and at least clinically her anemia seems to be improving. 5. Anticoagulation: Recheck her hemoglobin level.  Continue iron supplements.  Looks less pale today. 6. Elevated coronary calcium score: She does not have angina pectoris at rest or with activity.  No evidence of ischemia by nuclear stress testing.  Focus on risk factor modification.  Not on aspirin due to full anticoagulation. 7. HLP: Target LDL cholesterol should ideally be less than 70.  Was 68 on most recent assessment.   Medication Adjustments/Labs and Tests Ordered: Current medicines are reviewed at length with the patient today.  Concerns regarding medicines are outlined above.  Orders Placed This Encounter  Procedures  . Hemoglobin and hematocrit, blood  . EKG 12-Lead   No orders of the defined types were placed in this encounter.   Patient Instructions  Medication Instructions:  No changes *If you need a refill on your cardiac medications before your next  appointment, please call your pharmacy*   Lab Work: Your provider would like for you to have the following labs today: Hemoglobin and Hemacrit  If you have labs (blood work) drawn today and your tests are completely normal, you will receive your results only by: Marland Kitchen MyChart Message (if you have MyChart) OR . A paper copy in the mail If you have any lab test that is abnormal or we need to change your treatment, we will call you to review the results.   Testing/Procedures: None ordered   Follow-Up: At [facility], you and your health needs are our priority.  As part of our continuing mission to provide you with exceptional heart care, we have created designated Provider Care Teams.  These Care Teams include your primary Cardiologist (physician) and Advanced Practice Providers (APPs -  Physician Assistants and Nurse Practitioners) who all work together to provide you with the care you need, when you need it.  We recommend signing up for the patient portal called "MyChart".  Sign up information is provided on this After Visit Summary.  MyChart is used to connect with patients for Virtual Visits (Telemedicine).  Patients are able to view lab/test results, encounter notes, upcoming appointments, etc.  Non-urgent messages can be sent to your provider as well.   To learn more about what you can do with MyChart, go to [email or web address].    Your next appointment:   4 month(s)  The format for your next appointment:   In Person  Provider:   You may see Sanda Klein, MD or one of the following Advanced Practice Providers on your designated Care Team:    Almyra Deforest, PA-C  Fabian Sharp, [place] or   Roby Lofts, PA-C        Signed, Sanda Klein, MD  12/07/2019 6:28 PM    [place]

## 2019-12-07 NOTE — Telephone Encounter (Signed)
Medication sent to pharmacy  

## 2019-12-08 LAB — HEMOGLOBIN AND HEMATOCRIT, BLOOD
Hematocrit: 38.2 % (ref 34.0–46.6)
Hemoglobin: 12.4 g/dL (ref 11.1–15.9)

## 2019-12-14 ENCOUNTER — Encounter: Payer: Self-pay | Admitting: *Deleted

## 2019-12-14 ENCOUNTER — Telehealth: Payer: Self-pay | Admitting: Cardiovascular Disease

## 2019-12-14 NOTE — Telephone Encounter (Signed)
Patient made aware of results and verbalized understanding.  Marked improvement in Hemoglobin, up to 12. 4 (normal). Usually recommend continuing the iron supplement for about 3 months after the Hgb normalizes, to replete iron stores.

## 2019-12-14 NOTE — Telephone Encounter (Signed)
Patient is returning call to discuss results from lab work completed on 12/07/19.

## 2019-12-28 ENCOUNTER — Telehealth: Payer: Self-pay | Admitting: Cardiovascular Disease

## 2019-12-28 NOTE — Telephone Encounter (Signed)
SBP is a little high, but note the fairly low DBP. Not a whole lot of choices due to bradycardia and need to avoid potent vasodilators and diuretics. Would restart the amlodipine 2.5 mg daily (she may still have some at home), but if she begins to feel dizzy or has other side effects, will stop that and allow the slightly higher SBP.

## 2019-12-28 NOTE — Telephone Encounter (Signed)
Returned call to patient-patient reports continued concerns with elevated blood pressure readings.   BP readings reported:   AM  PM 9/21 164 63 45 150/59 43 9/22 163/45 45 112/53 51 9/23 155/86 47  9/24 183/97 48 162/59 53 9/26 169/57 48 112/52 55 9/27 141/62 50  140/53 48 9/28 143/53 50  AM meds: lisinopril 20 Lunch: bisoprolol 2.5 PM: lisinopril 20  She denies HA, blurred vision, CP, SOB.  Asymptomatic.   She states Dr. Sallyanne Kuster had mentioned adding another BP medication at her last visit and she was wondering if this should be done, she is concerned with the elevated readings.   Routed to MD to review.

## 2019-12-28 NOTE — Telephone Encounter (Signed)
    Pt would like to speak with Dr. Johnnette Gourd nurse she said its about her BP. She said it is still consistently higher than her normal BP. She said Dr. Loletha Grayer told her from her last OV he will give her new prescription if needed.

## 2019-12-29 MED ORDER — AMLODIPINE BESYLATE 2.5 MG PO TABS: 2.5000 mg | ORAL_TABLET | Freq: Every day | ORAL | 3 refills | Status: DC

## 2019-12-29 NOTE — Telephone Encounter (Signed)
The patient has been made aware. She will restart the Amlodipine 2.5 mg once daily and call back if she becomes symptomatic.  She will continue to keep a blood pressure log.  She does not need a new prescription sent in to the pharmacy.

## 2020-01-05 ENCOUNTER — Telehealth: Payer: Self-pay | Admitting: *Deleted

## 2020-01-05 NOTE — Telephone Encounter (Signed)
   [facility] Pre-operative Risk Assessment    HEARTCARE STAFF: - Request for surgical clearance:  1. What type of surgery is being performed? COLONOSCOPY   2. When is this surgery scheduled?  TBD   3. What type of clearance is required (medical clearance vs. Pharmacy clearance to hold med vs. Both)? BOTH  4. Are there any medications that need to be held prior to surgery and how long? ELIQUIS 2 DAYS  5. Practice name and name of physician performing surgery? [facility] [place]  6. What is the office phone number?  [phone]   7.   What is the office fax number?  [phone]  8.   Anesthesia type (None, local, MAC, general) ?  PROPOFOL   Devra Dopp 01/05/2020, 10:09 AM  _________________________________________________________________   (provider comments below)

## 2020-01-06 NOTE — Telephone Encounter (Signed)
   Primary Cardiologist: Sanda Klein, MD  Chart reviewed as part of pre-operative protocol coverage. Given past medical history and time since last visit, based on ACC/AHA guidelines, Darothy Courtright would be at acceptable risk for the planned procedure without further cardiovascular testing.   Patient with diagnosis of afib on Eliquis for anticoagulation.    Procedure: COLONOSCOPY Date of procedure: TBD  CHADS2-VASc score of  4 (HTN, AGE, CAD, female)  CrCl 50.8 ml/min  Per office protocol, patient can hold Eliquis for 2 days prior to procedure.   I will route this recommendation to the requesting party via Epic fax function and remove from pre-op pool.  Please call with questions.  Jossie Ng. Renn Stille NP-C    01/06/2020, [address] Office [phone] Fax [phone]

## 2020-01-06 NOTE — Telephone Encounter (Signed)
Patient with diagnosis of afib on Eliquis for anticoagulation.    Procedure: COLONOSCOPY  Date of procedure: TBD  CHADS2-VASc score of  4 (HTN, AGE, CAD, female)  CrCl 50.8 ml/min  Per office protocol, patient can hold Eliquis for 2 days prior to procedure.

## 2020-01-26 DIAGNOSIS — Z01818 Encounter for other preprocedural examination: Secondary | ICD-10-CM | POA: Diagnosis not present

## 2020-01-28 DIAGNOSIS — K644 Residual hemorrhoidal skin tags: Secondary | ICD-10-CM | POA: Diagnosis not present

## 2020-01-28 DIAGNOSIS — K6389 Other specified diseases of intestine: Secondary | ICD-10-CM | POA: Diagnosis not present

## 2020-01-28 DIAGNOSIS — K625 Hemorrhage of anus and rectum: Secondary | ICD-10-CM | POA: Diagnosis not present

## 2020-01-28 DIAGNOSIS — I495 Sick sinus syndrome: Secondary | ICD-10-CM | POA: Diagnosis not present

## 2020-01-28 DIAGNOSIS — K5730 Diverticulosis of large intestine without perforation or abscess without bleeding: Secondary | ICD-10-CM | POA: Diagnosis not present

## 2020-01-28 DIAGNOSIS — K648 Other hemorrhoids: Secondary | ICD-10-CM | POA: Diagnosis not present

## 2020-01-28 DIAGNOSIS — K2970 Gastritis, unspecified, without bleeding: Secondary | ICD-10-CM | POA: Diagnosis not present

## 2020-01-28 DIAGNOSIS — K449 Diaphragmatic hernia without obstruction or gangrene: Secondary | ICD-10-CM | POA: Diagnosis not present

## 2020-01-28 DIAGNOSIS — R195 Other fecal abnormalities: Secondary | ICD-10-CM | POA: Diagnosis not present

## 2020-01-28 DIAGNOSIS — K5731 Diverticulosis of large intestine without perforation or abscess with bleeding: Secondary | ICD-10-CM | POA: Diagnosis not present

## 2020-01-28 DIAGNOSIS — D509 Iron deficiency anemia, unspecified: Secondary | ICD-10-CM | POA: Diagnosis not present

## 2020-01-31 ENCOUNTER — Telehealth: Payer: Self-pay | Admitting: Cardiovascular Disease

## 2020-01-31 NOTE — Telephone Encounter (Signed)
Routing to Dr. Sallyanne Kuster and primary RN.

## 2020-01-31 NOTE — Telephone Encounter (Signed)
Patient would like to discuss getting a pacemaker with Dr. Lurline Del Nurse

## 2020-02-01 NOTE — Telephone Encounter (Signed)
Returned the call to the patient. She stated that she thinks she may be ready for a pacemaker but would like to discuss this more with Dr. Sallyanne Kuster. She has an appointment on 1/11 but wants to know if Dr. Sallyanne Kuster feels like she needs to be seen sooner. She has been advised that Dr. Sallyanne Kuster will return on 02/07/20.

## 2020-02-02 ENCOUNTER — Ambulatory Visit: Payer: Medicare PPO | Admitting: Cardiology

## 2020-02-07 NOTE — Telephone Encounter (Signed)
From my point of view, the procedure is not urgent, unless she develops new complaints such as recurrent syncope.  If we have an opening on the schedule, can squeeze her in earlier.

## 2020-02-07 NOTE — Telephone Encounter (Signed)
Spoke with the patient. Appointment made for 11/17 with Dr. Sallyanne Kuster.

## 2020-02-15 DIAGNOSIS — D500 Iron deficiency anemia secondary to blood loss (chronic): Secondary | ICD-10-CM | POA: Diagnosis not present

## 2020-02-16 ENCOUNTER — Ambulatory Visit: Payer: Medicare PPO | Admitting: Cardiovascular Disease

## 2020-02-16 ENCOUNTER — Encounter: Payer: Self-pay | Admitting: Cardiovascular Disease

## 2020-02-16 ENCOUNTER — Other Ambulatory Visit: Payer: Self-pay

## 2020-02-16 VITALS — BP 176/70 | HR 67 | Ht 62.0 in | Wt 136.0 lb

## 2020-02-16 DIAGNOSIS — Z7901 Long term (current) use of anticoagulants: Secondary | ICD-10-CM | POA: Diagnosis not present

## 2020-02-16 DIAGNOSIS — I10 Essential (primary) hypertension: Secondary | ICD-10-CM

## 2020-02-16 DIAGNOSIS — I480 Paroxysmal atrial fibrillation: Secondary | ICD-10-CM

## 2020-02-16 DIAGNOSIS — R931 Abnormal findings on diagnostic imaging of heart and coronary circulation: Secondary | ICD-10-CM | POA: Diagnosis not present

## 2020-02-16 DIAGNOSIS — E7800 Pure hypercholesterolemia, unspecified: Secondary | ICD-10-CM

## 2020-02-16 DIAGNOSIS — I421 Obstructive hypertrophic cardiomyopathy: Secondary | ICD-10-CM | POA: Diagnosis not present

## 2020-02-16 DIAGNOSIS — Z01818 Encounter for other preprocedural examination: Secondary | ICD-10-CM | POA: Diagnosis not present

## 2020-02-16 DIAGNOSIS — I495 Sick sinus syndrome: Secondary | ICD-10-CM

## 2020-02-16 LAB — CBC
Hematocrit: 34.2 % (ref 34.0–46.6)
Hemoglobin: 11.3 g/dL (ref 11.1–15.9)
MCH: 30.5 pg (ref 26.6–33.0)
MCHC: 33 g/dL (ref 31.5–35.7)
MCV: 92 fL (ref 79–97)
Platelets: 213 10*3/uL (ref 150–450)
RBC: 3.71 x10E6/uL — ABNORMAL LOW (ref 3.77–5.28)
RDW: 13.3 % (ref 11.7–15.4)
WBC: 5.8 10*3/uL (ref 3.4–10.8)

## 2020-02-16 LAB — BASIC METABOLIC PANEL
BUN/Creatinine Ratio: 11 — ABNORMAL LOW (ref 12–28)
BUN: 9 mg/dL (ref 8–27)
CO2: 27 mmol/L (ref 20–29)
Calcium: 10.6 mg/dL — ABNORMAL HIGH (ref 8.7–10.3)
Chloride: 105 mmol/L (ref 96–106)
Creatinine, Ser: 0.8 mg/dL (ref 0.57–1.00)
GFR calc Af Amer: 88 mL/min/{1.73_m2} (ref >59)
GFR calc non Af Amer: 77 mL/min/{1.73_m2} (ref >59)
Glucose: 79 mg/dL (ref 65–99)
Potassium: 4.3 mmol/L (ref 3.5–5.2)
Sodium: 142 mmol/L (ref 134–144)

## 2020-02-16 NOTE — H&P (View-Only) (Signed)
Cardiology Office Note:    Date:  02/16/2020   ID:  Alexandria French, DOB 07-17-1950, MRN [id number]  PCP:  Alexandria Percy, MD  [facility] Cardiologist:  Alexandria French [facility] Electrophysiologist:  None   Referring MD: Alexandria Percy, MD   Chief Complaint  Patient presents with  . Loss of Consciousness    History of Present Illness:    Alexandria French is a 69 y.o. 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  However due to bradycardia she can only tolerate a very low dose of beta-blocker.  Her palpitations are much better since starting the bisoprolol, but she is frequently quite bradycardic in the 40s.  Usually she tolerates this well, but it is possible that she had a brief syncopal event lying in bed a couple of weeks ago.  She remembers everything turning white and does not remember her husband calling out her name.  There were no associated palpitations.  According to him she recovered consciousness after a couple of seconds.  She has not had any chest discomfort and has not had falls.  Denies focal neurological complaints, edema or claudication.  Because of iron deficiency she underwent a colonoscopy with Dr. Rocco French at [facility], but this was an incomplete procedure due to steep angulation of the colon.  She often has elevated systolic blood pressure in the 150-160 range, but her diastolic blood pressure is quite low typically in the 50s.  The pulse pressure is particularly brought no more bradycardic she is.  She wore an event monitor that showed prominent sinus bradycardia and chronotropic incompetence, with a maximum heart rate but only reached 60% of max predicted.  She did have  frequent mostly monomorphic PVCs (6% of all beats).  Also had some brief bursts of nonsustained atrial tachycardia.  There was no complex ventricular arrhythmia.  She has not had full syncope previously or documented VT. There is no family history of HCM, VT, sudden death or CHF.  2 separate echocardiograms performed in August 2020 in May 2021 show findings consistent with hypertrophic obstructive cardiomyopathy, although the degree of left ventricular hypertrophy is relatively mild and appears to be concentric rather than asymmetrical.  Systolic anterior motion of the mitral valve with increased gradients across the left ventricular outflow tract.  These have been calculated at 145 mmHg and 213 mmHg respectively, although I am not sure that the estimation is accurate.  There seems to be a lot of contamination with mitral regurgitant flow on the Doppler signal.  There is however convincing evidence of gradients across the LVOT of at least 50 mmHg at rest.  On both studies the left ventricular systolic function is hyperdynamic with ejection fraction in excess of 75%.  There is no evidence of major abnormalities of the aortic valve itself.  Coronary CT angiogram performed in September 2020 showed a calcium score in the 85th percentile with no major coronary artery stenoses.  The coronary system is left dominant.  There is very mild bilateral carotid artery plaque with stenoses less than 40%.  Atrial fibrillation was reportedly detected on a previous cardiac monitor in June 2019.  The ventricular rate was over 178 bpm.  The episode was associated with dizziness and lightheadedness.  I am not sure it can accurately be categorized as  atrial fibrillation since it was so brief, but she definitely had at least paroxysmal atrial tachycardia.  Although frequent PVCs including ventricular bigeminy were recorded, there was no evidence of ventricular tachycardia..  She has a history of essential hypertension which is  currently being managed with high-dose lisinopril and a low-dose of bisoprolol.  Past Medical History:  Diagnosis Date  . A-fib ([place])   . CAD (coronary artery disease)   . Esophageal reflux    no meds  . Hypertension   . Other and unspecified hyperlipidemia     Past Surgical History:  Procedure Laterality Date  . ABDOMINAL HYSTERECTOMY  08/2008  . BLADDER REPAIR    . TONSILLECTOMY      Current Medications: Current Meds  Medication Sig  . amLODipine (NORVASC) 2.5 MG tablet Take 1 tablet (2.5 mg total) by mouth daily.  Marland Kitchen apixaban (ELIQUIS) 5 MG TABS tablet Take 1 tablet (5 mg total) by mouth 2 (two) times daily.  . bisoprolol (ZEBETA) 5 MG tablet Take 0.5 tablets (2.5 mg total) by mouth daily.  . Calcium Carbonate-Vitamin D (CALTRATE 600+D) 600-400 MG-UNIT per tablet Take 1 tablet by mouth 2 (two) times daily.   . cetirizine (ZYRTEC) 10 MG tablet Take 10 mg by mouth daily.  . Coenzyme Q10 (COQ-10) 100 MG CAPS Take 1 capsule by mouth daily.  . ferrous sulfate 325 (65 FE) MG tablet Take 325 mg by mouth daily with breakfast.  . fluticasone (FLONASE) 50 MCG/ACT nasal spray Place 2 sprays into both nostrils daily.  Marland Kitchen lisinopril (ZESTRIL) 20 MG tablet Take 1 tablet (20 mg total) by mouth 2 (two) times daily.  Marland Kitchen LORazepam (ATIVAN) 1 MG tablet Take 1 mg by mouth daily as needed for anxiety.  Marland Kitchen omega-3 acid ethyl esters (LOVAZA) 1 g capsule Take by mouth 2 (two) times daily.  . simvastatin (ZOCOR) 20 MG tablet Take 20 mg by mouth daily.  . vitamin B-12 (CYANOCOBALAMIN) 250 MCG tablet Take 250 mcg by mouth 2 (two) times a week.   . vitamin C (ASCORBIC ACID) 250 MG tablet Take 500 mg by mouth daily.     Allergies:   Cefzil [cefprozil]   Social History   Socioeconomic History  . Marital status: Married    Spouse name: Not on file  . Number of children: Not on file  . Years of education: Not on file  . Highest education level: Not on file  Occupational History  . Not on file   Tobacco Use  . Smoking status: Never Smoker  . Smokeless tobacco: Never Used  Substance and Sexual Activity  . Alcohol use: No    Alcohol/week: 0.0 standard drinks  . Drug use: No  . Sexual activity: Not on file  Other Topics Concern  . Not on file  Social History Narrative  . Not on file   Social Determinants of Health   Financial Resource Strain:   . Difficulty of Paying Living Expenses: Not on file  Food Insecurity:   . Worried About Charity fundraiser in the Last Year: Not on file  . Ran Out of Food in the Last Year: Not on file  Transportation Needs:   . Lack of Transportation (Medical): Not on file  . Lack of Transportation (Non-Medical): Not on file  Physical Activity:   . Days of Exercise per Week: Not on file  . Minutes of Exercise per Session: Not on file  Stress:   . Feeling of Stress : Not on file  Social Connections:   . Frequency of Communication with Friends and Family: Not on file  . Frequency of Social Gatherings with Friends and Family: Not on file  . Attends Religious Services: Not on file  . Active Member of Clubs or Organizations: Not on file  . Attends Archivist Meetings: Not on file  . Marital Status: Not on file     Family History: The patient's family history includes Arthritis in an other family member; Heart disease in an other family member; Osteoporosis in an other family member. There is no history of Colon cancer or Colon polyps.  Father lived to 89 years old, has an older sister 60 years old who is healthy, had another sister died at age 39 of a lung infection.  1 child (son), healthy.  ROS:   Please see the history of present illness. All other systems are reviewed and are negative.    EKGs/Labs/Other Studies Reviewed:    The following studies were reviewed today: Echocardiogram 08/27/2019  1. LVOT peak gradient 213 mmHg. Left ventricular ejection fraction, by estimation, is 70 to 75%. The left ventricle has hyperdynamic  function. The left ventricle has no regional wall motion abnormalities. There is mild concentric left ventricular hypertrophy. Left ventricular diastolic parameters are consistent with Grade I diastolic dysfunction (impaired relaxation). Elevated left atrial pressure. 2. Right ventricular systolic function is normal. The right ventricular size is normal. There is normal pulmonary artery systolic pressure. 3. The mitral valve is grossly normal. Trivial mitral valve regurgitation. 4. The aortic valve is tricuspid. Aortic valve regurgitation is mild. No aortic stenosis is present. 5. The inferior vena cava is normal in size with greater than 50% respiratory variability, suggesting right atrial pressure of 3 mmHg.   Echocardiogram 11/26/2018: 1. The left ventricle has a visually estimated ejection fraction of 75%.  The cavity size was normal. Left ventricular diastolic Doppler parameters  are consistent with impaired relaxation. Elevated left ventricular  end-diastolic pressure No evidence of  left ventricular regional wall motion abnormalities.  2. The right ventricle has normal systolic function. The cavity was  normal. There is no increase in right ventricular wall thickness.  3. The aortic valve is tricuspid. Mild thickening of the aortic valve.  Aortic valve regurgitation is mild by color flow Doppler. Mild aortic  annular calcification noted.  4. The mitral valve is abnormal. Moderate thickening of the mitral valve  leaflet. Mitral valve regurgitation is moderate by color flow Doppler.  5. Mitral chordal SAM is noted. LVOT is small. Peak gradient 145 mmHg.  6. The tricuspid valve is grossly normal. Tricuspid valve regurgitation  is moderate.  7. The aorta is normal unless otherwise noted.   Coronary CT angiography 12/03/2018: FINDINGS: Coronary calcium score: The patient's coronary artery calcium score is 149, which places the patient in the 85th percentile. Coronary  arteries: Normal coronary origins. Left dominance Right Coronary Artery: Small, non-dominant vessel which gives off a larger AV nodal branch. No obstructive disease. Left Main Coronary Artery: Prominent left coronary cusp, but not anuerymsal. No stenosis. Gives off larger LAD and LCx arteries. Left Anterior Descending Coronary Artery: Calcified proximal vessel with mild 25-49% (CADRADS2) mixed stenosis. Gives off several small diagonal branches. Ramus Intermedius Artery: Small branch without significant stenosis.  Left Circumflex Artery: Dominant vessel that gives off a small PDA artery. Larger caliber with minimal mixed 1-24% mid-vessel stenosis (CADRADS1).  Aorta: Normal size, 28 mm at the mid ascending aorta (level of the PA bifurcation) measured double  oblique. Atherosclerosis. No dissection. Aortic Valve: Trileaflet. Calcification of the annulus.  Other findings: Thickening of the aortic and mitral valve leaflets. Normal biatrial size Normal pulmonary vein drainage into the left atrium. Normal left atrial appendage without a thrombus. Normal size of the pulmonary artery.  IMPRESSION: 1. Mild, non-obstructive CAD primarily of the LAD, CADRADS = 2. 2. Coronary calcium score of 149. This was 85th percentile for age and sex matched control. 3. Normal coronary origin with left dominance. 4. Thickening of the aortic and mitral valve leaflets  MCOT monitor 07/14/2017  Sinus rhythm with frequent PVCs. There was one episode of rapid atrial fibrillation.   EKG:  EKG is ordered today and shows sinus bradycardia, left atrial abnormality and left bundle branch block. Recent Labs: 12/07/2019: Hemoglobin 12.4  10/07/2019 Creatinine 0.86, glucose 92, potassium 4.0, normal liver function test Hemoglobin 8.7 with low MCV TSH 3.600 Recent Lipid Panel No results found for: CHOL, TRIG, HDL, CHOLHDL, VLDL, LDLCALC, LDLDIRECT 10/07/2019 Total cholesterol 171, HDL 56, LDL 86, triglycerides  145 Physical Exam:    VS:  BP (!) 176/70   Pulse 67   Ht 5\' 2"  (1.575 m)   Wt 136 lb (61.7 kg)   SpO2 98%   BMI 24.87 kg/m     Wt Readings from Last 3 Encounters:  02/16/20 136 lb (61.7 kg)  12/07/19 138 lb 12.8 oz (63 kg)  11/03/19 139 lb (63 kg)      General: Alert, oriented x3, no distress, appears well Head: no evidence of trauma, PERRL, EOMI, no exophtalmos or lid lag, no myxedema, no xanthelasma; normal ears, nose and oropharynx Neck: normal jugular venous pulsations and no hepatojugular reflux; brisk carotid pulses without delay and no carotid bruits Chest: clear to auscultation, no signs of consolidation by percussion or palpation, normal fremitus, symmetrical and full respiratory excursions Cardiovascular: normal position and quality of the apical impulse, regular rhythm, normal first and second heart sounds, no murmurs, rubs or gallops.  A systolic murmur is readily inducible with a Valsalva maneuver Abdomen: no tenderness or distention, no masses by palpation, no abnormal pulsatility or arterial bruits, normal bowel sounds, no hepatosplenomegaly Extremities: no clubbing, cyanosis or edema; 2+ radial, ulnar and brachial pulses bilaterally; 2+ right femoral, posterior tibial and dorsalis pedis pulses; 2+ left femoral, posterior tibial and dorsalis pedis pulses; no subclavian or femoral bruits Neurological: grossly nonfocal Psych: Normal mood and affect'  ASSESSMENT:    1. HOCM (hypertrophic obstructive cardiomyopathy) ([place])   2. SSS (sick sinus syndrome) ([place])   3. Essential hypertension   4. Paroxysmal atrial fibrillation (HCC)   5. Long term (current) use of anticoagulants   6. Elevated coronary artery calcium score   7. Hypercholesterolemia   8. Pre-op evaluation    PLAN:    In order of problems listed above:  1. HOCM: Both her physical exam and her echocardiograms are highly suggestive of hypertrophic obstructive cardiomyopathy.  Try to avoid diuretics and  potent vasodilators.  Only can take a low-dose of beta-blocker due to underlying bradycardia.  Following pacemaker implantation which should be able to increase the beta-blocker dose to help suppress the frequent PVCs and reduce the likelihood of outflow tract obstruction.  If necessary, we can also shorten the AV delay to perform committed ventricular pacing, but this may not be necessary once we have her on higher beta-blocker doses.  2.  SSS: Especially with a recent syncopal event, recommend pacemaker implantation.  This will allow higher beta-blocker doses as well as  treatment of her symptoms of chronotropic incompetence.  The procedure was discussed in detail with the patient and her husband.  Potential complications and side effects of dual-chamber permanent pacemaker were reviewed in detail. This procedure has been fully reviewed with the patient and written informed consent has been obtained. We will go ahead with a pacemaker with minute ventilation sensor which should provide the best treatment for her complaints. 3. HTN: After pacemaker implantation will increase her dose of bisoprolol.  Trying to avoid diuretics and potent vasodilators. 4. AFib: I am not confident that she ever had paroxysmal atrial fibrillation, but she definitely has episodes of paroxysmal atrial tachycardia and due to her underlying structural heart disease is at high risk for developing atrial fibrillation.  So far she is tolerating anticoagulation well and at least clinically her anemia seems to be improving.  The pacemaker will provide much better monitoring for recurrence of possible atrial fibrillation. 5. Anticoagulation: Most recent hemoglobin level was 12.4 on December 07, 2019.  At her request offered contact information for [facility].  She would like to consider going ahead with a complete colonoscopy.  Continue iron supplements.  Looks less pale today. 6. Elevated coronary calcium score: Does not have angina  pectoris.  She does not have angina pectoris at rest or with activity.  No evidence of ischemia by nuclear stress testing.  Focus on risk factor modification.  Not on aspirin due to full anticoagulation. 7. HLP: Target LDL cholesterol should ideally be less than 70.  Was 61 on most recent assessment, improving. On statin   Medication Adjustments/Labs and Tests Ordered: Current medicines are reviewed at length with the patient today.  Concerns regarding medicines are outlined above.  Orders Placed This Encounter  Procedures  . Basic metabolic panel  . CBC   No orders of the defined types were placed in this encounter.   Patient Instructions  Medications: Your physician recommends that you continue on your current medications as directed. Please refer to the Current Medication list given to you today.     * If you need a refill on your cardiac medications before your next appointment, please call your pharmacy. *  Testing/Procedures: Your physician has recommended that you have a pacemaker inserted. A pacemaker is a small device that is placed under the skin of your chest or abdomen to help control abnormal heart rhythms. This device uses electrical pulses to prompt the heart to beat at a normal rate. Pacemakers are used to treat heart rhythms that are too slow. Wire (leads) are attached to the pacemaker that goes into the chambers of you heart. This is done in the hospital and usually requires and overnight stay. Please follow the instructions below, located under the special instructions section.  Follow-Up: Your physician recommends that you schedule a wound check appointment 10-14 days, after your procedure on 02/28/20, with the device clinic.   Your physician recommends that you schedule a follow up appointment in 91 days, after your procedure on 02/28/20, with Dr. Sallyanne Kuster.  Thank you for choosing [facility]!!      Any Other Special Instructions Will Be Listed Below (If  Applicable).     Implantable Device Instructions  You are scheduled for Permanent Transvenous Pacemaker  on  02/28/20  with Dr. Sallyanne Kuster.  1.   Please arrive at the [facility], Entrance "A"  at [facility] at  11:30 am on the day of your procedure. (The address is [address]  2.  Do not eat or drink after midnight the night before your procedure.  3.  You will need to have the coronavirus test completed prior to your procedure. An appointment has been made at 2:10 pm on 02/25/20. This is a Drive Up Visit at [address], [place], [place] [address]. Please tell them that you are there for procedure testing. Stay in your car and someone will be with you shortly. Please make sure to have all other labs completed before this test because you will need to stay quarantined until your procedure.  4. Hold the Eliquis for two days prior (Starting Saturday 02/26/20)  5.  Plan for an overnight stay.  Bring your insurance cards and a list of you medications.  6.  Wash your chest and neck with surgical scrub the evening before and the morning of your procedure.  Rinse well. Please review the surgical scrub instruction sheet given to you. You may pick this up at the [facility] office or any drug store.  7. Your chest will need to be shaved prior to this procedure (if needed). We ask that you do this yourself at home 1 to 2 days before or if uncomfortable/unable to do yourself, then it will be performed by the hospital staff the day of.                                                                                                                * If you have ant questions after you get home, please call Lattie Haw, RN at [phone]  * Every attempt is made to prevent procedures from being rescheduled.  Due to the nature of  Electrophysiology, rescheduling can happen.  The physician is always aware and directs the staff when this occurs.     Pacemaker Implantation,  Adult Pacemaker implantation is a procedure to place a pacemaker inside your chest. A pacemaker is a small computer that sends electrical signals to the heart and helps your heart beat normally. A pacemaker also stores information about your heart rhythms. You may need pacemaker implantation if you:  Have a slow heartbeat (bradycardia).  Faint (syncope).  Have shortness of breath (dyspnea) due to heart problems.  The pacemaker attaches to your heart through a wire, called a lead. Sometimes just one lead is needed. Other times, there will be two leads. There are two types of pacemakers:  Transvenous pacemaker. This type is placed under the skin or muscle of your chest. The lead goes through a vein in the chest area to reach the inside of the heart.  Epicardial pacemaker. This type is placed under the skin or muscle of your chest or belly. The lead goes through your chest to the outside of the heart.  Tell a health care provider about:  Any allergies you have.  All medicines you are taking, including vitamins, herbs, eye drops, creams, and over-the-counter medicines.  Any problems you or family members have had with anesthetic medicines.  Any blood or bone disorders you have.  Any surgeries you  have had.  Any medical conditions you have.  Whether you are pregnant or may be pregnant. What are the risks? Generally, this is a safe procedure. However, problems may occur, including:  Infection.  Bleeding.  Failure of the pacemaker or the lead.  Collapse of a lung or bleeding into a lung.  Blood clot inside a blood vessel with a lead.  Damage to the heart.  Infection inside the heart (endocarditis).  Allergic reactions to medicines.  What happens before the procedure? Staying hydrated Follow instructions from your health care provider about hydration, which may include:  Up to 2 hours before the procedure - you may continue to drink clear liquids, such as water, clear  fruit juice, black coffee, and plain tea.  Eating and drinking restrictions Follow instructions from your health care provider about eating and drinking, which may include:  8 hours before the procedure - stop eating heavy meals or foods such as meat, fried foods, or fatty foods.  6 hours before the procedure - stop eating light meals or foods, such as toast or cereal.  6 hours before the procedure - stop drinking milk or drinks that contain milk.  2 hours before the procedure - stop drinking clear liquids.  Medicines  Ask your health care provider about: ? Changing or stopping your regular medicines. This is especially important if you are taking diabetes medicines or blood thinners. ? Taking medicines such as aspirin and ibuprofen. These medicines can thin your blood. Do not take these medicines before your procedure if your health care provider instructs you not to.  You may be given antibiotic medicine to help prevent infection. General instructions  You will have a heart evaluation. This may include an electrocardiogram (ECG), chest X-ray, and heart imaging (echocardiogram,  or echo) tests.  You will have blood tests.  Do not use any products that contain nicotine or tobacco, such as cigarettes and e-cigarettes. If you need help quitting, ask your health care provider.  Plan to have someone take you home from the hospital or clinic.  If you will be going home right after the procedure, plan to have someone with you for 24 hours.  Ask your health care provider how your surgical site will be marked or identified. What happens during the procedure?  To reduce your risk of infection: ? Your health care team will wash or sanitize their hands. ? Your skin will be washed with soap. ? Hair may be removed from the surgical area.  An IV tube will be inserted into one of your veins.  You will be given one or more of the following: ? A medicine to help you relax (sedative). ? A  medicine to numb the area (local anesthetic). ? A medicine to make you fall asleep (general anesthetic).  If you are getting a transvenous pacemaker: ? An incision will be made in your upper chest. ? A pocket will be made for the pacemaker. It may be placed under the skin or between layers of muscle. ? The lead will be inserted into a blood vessel that returns to the heart. ? While X-rays are taken by an imaging machine (fluoroscopy), the lead will be advanced through the vein to the inside of your heart. ? The other end of the lead will be tunneled under the skin and attached to the pacemaker.  If you are getting an epicardial pacemaker: ? An incision will be made near your ribs or breastbone (sternum) for the lead. ? The  lead will be attached to the outside of your heart. ? Another incision will be made in your chest or upper belly to create a pocket for the pacemaker. ? The free end of the lead will be tunneled under the skin and attached to the pacemaker.  The transvenous or epicardial pacemaker will be tested. Imaging studies may be done to check the lead position.  The incisions will be closed with stitches (sutures), adhesive strips, or skin glue.  Bandages (dressing) will be placed over the incisions. The procedure may vary among health care providers and hospitals. What happens after the procedure?  Your blood pressure, heart rate, breathing rate, and blood oxygen level will be monitored until the medicines you were given have worn off.  You will be given antibiotics and pain medicine.  ECG and chest x-rays will be done.  You will wear a continuous type of ECG (Holter monitor) to check your heart rhythm.  Your health care provider will program the pacemaker.  Do not drive for 24 hours if you received a sedative. This information is not intended to replace advice given to you by your health care provider. Make sure you discuss any questions you have with your health care  provider. Document Released: 03/08/2002 Document Revised: 10/06/2015 Document Reviewed: 08/30/2015 Elsevier Interactive Patient Education  2018 [employer].     Pacemaker Implantation, Adult, Care After This sheet gives you information about how to care for yourself after your procedure. Your health care provider may also give you more specific instructions. If you have problems or questions, contact your health care provider. What can I expect after the procedure? After the procedure, it is common to have:  Mild pain.  Slight bruising.  Some swelling over the incision.  A slight bump over the skin where the device was placed. Sometimes, it is possible to feel the device under the skin. This is normal.  Follow these instructions at home: Medicines  Take over-the-counter and prescription medicines only as told by your health care provider.  If you were prescribed an antibiotic medicine, take it as told by your health care provider. Do not stop taking the antibiotic even if you start to feel better. Wound care  Do not remove the bandage on your chest until directed to do so by your health care provider.  After your bandage is removed, you may see pieces of tape called skin adhesive strips over the area where the cut was made (incision site). Let them fall off on their own.  Check the incision site every day to make sure it is not infected, bleeding, or starting to pull apart.  Do not use lotions or ointments near the incision site unless directed to do so.  Keep the incision area clean and dry for 2-3 days after the procedure or as directed by your health care provider. It takes several weeks for the incision site to completely heal.  Do not take baths, swim, or use a hot tub for 7-10 days or as otherwise directed by your health care provider. Activity  Do not drive or use heavy machinery while taking prescription pain medicine.  Do not drive for 24 hours if you were given  a medicine to help you relax (sedative).  Check with your health care provider before you start to drive or play sports.  Avoid sudden jerking, pulling, or chopping movements that pull your upper arm far away from your body. Avoid these movements for at least 6 weeks or as  long as told by your health care provider.  Do not lift your upper arm above your shoulders for at least 6 weeks or as long as told by your health care provider. This means no tennis, golf, or swimming.  You may go back to work when your health care provider says it is okay. Pacemaker care  You may be shown how to transfer data from your pacemaker through the phone to your health care provider.  Always let all health care providers know about your pacemaker before you have any medical procedures or tests.  Wear a medical ID bracelet or necklace stating that you have a pacemaker. Carry a pacemaker ID card with you at all times.  Your pacemaker battery will last for 5-15 years. Routine checks by your health care provider will let the health care provider know when the battery is starting to run down. The pacemaker will need to be replaced when the battery starts to run down.  Do not use amateur [employer]. Other electrical devices are safe to use, including power tools, lawn mowers, and speakers. If you are unsure of whether something is safe to use, ask your health care provider.  When using your cell phone, hold it to the ear opposite the pacemaker. Do not leave your cell phone in a pocket over the pacemaker.  Avoid places or objects that have a strong electric or magnetic field, including: ? [employer]. When at the airport, let officials know that you have a pacemaker. ? Power plants. ? Large electrical generators. ? Radiofrequency transmission towers, such as cell phone and radio towers. General instructions  Weigh yourself every day. If you suddenly gain weight, fluid  may be building up in your body.  Keep all follow-up visits as told by your health care provider. This is important. Contact a health care provider if:  You gain weight suddenly.  Your legs or feet swell.  It feels like your heart is fluttering or skipping beats (heart palpitations).  You have chills or a fever.  You have more redness, swelling, or pain around your incisions.  You have more fluid or blood coming from your incisions.  Your incisions feel warm to the touch.  You have pus or a bad smell coming from your incisions. Get help right away if:  You have chest pain.  You have trouble breathing or are short of breath.  You become extremely tired.  You are light-headed or you faint. This information is not intended to replace advice given to you by your health care provider. Make sure you discuss any questions you have with your health care provider. Document Released: 10/05/2004 Document Revised: 12/29/2015 Document Reviewed: 12/29/2015 Elsevier Interactive Patient Education  2018 [place] Discharge Instructions for  Pacemaker/Defibrillator Patients  ACTIVITY No heavy lifting or vigorous activity with your left/right arm for 6 to 8 weeks.  Do not raise your left/right arm above your head for one week.  Gradually raise your affected arm as drawn below.           __  NO DRIVING for     ; you may begin driving on     .  WOUND CARE - Keep the wound area clean and dry.  Do not get this area wet for one week. No showers for one week; you may shower on     . - The tape/steri-strips on your wound will fall off; do not pull  them off.  No bandage is needed on the site.  DO  NOT apply any creams, oils, or ointments to the wound area. - If you notice any drainage or discharge from the wound, any swelling or bruising at the site, or you develop a fever > 101? F after you are discharged home, call the office at once.  SPECIAL INSTRUCTIONS - You are  still able to use cellular telephones; use the ear opposite the side where you have your pacemaker/defibrillator.  Avoid carrying your cellular phone near your device. - When traveling through airports, show security personnel your identification card to avoid being screened in the metal detectors.  Ask the security personnel to use the hand wand. - Avoid arc welding equipment, MRI testing (magnetic resonance imaging), TENS units (transcutaneous nerve stimulators).  Call the office for questions about other devices. - Avoid electrical appliances that are in poor condition or are not properly grounded. - Microwave ovens are safe to be near or to operate.  ADDITIONAL INFORMATION FOR DEFIBRILLATOR PATIENTS SHOULD YOUR DEVICE GO OFF: - If your device goes off ONCE and you feel fine afterward, notify the device clinic nurses. - If your device goes off ONCE and you do not feel well afterward, call 911. - If your device goes off TWICE, call 911. - If your device goes off [place], call 911.  DO NOT DRIVE YOURSELF OR A FAMILY MEMBER WITH A DEFIBRILLATOR TO THE HOSPITAL--CALL 911.    [facility] - Preparing For Surgery  Before surgery, you can play an important role. Because skin is not sterile, your skin needs to be as free of germs as possible. You can reduce the number of germs on your skin by washing with CHG (chlorahexidine gluconate) Soap before surgery.  CHG is an antiseptic cleaner which kills germs and bonds with the skin to continue killing germs even after washing.   Please do not use if you have an allergy to CHG or antibacterial soaps.  If your skin becomes reddened/irritated stop using the CHG.   Do not shave (including legs and underarms) for at least 48 hours prior to first CHG shower.  It is OK to shave your face.  Please follow these instructions carefully:  1.  Shower the night before surgery and the morning of surgery with CHG.  2.  If you choose to wash your hair,  wash your hair first as usual with your normal shampoo.  3.  After you shampoo, rinse your hair and body thoroughly to remove the shampoo.  4.  Use CHG as you would any other liquid soap.  You can apply CHG directly to the skin and wash gently with a clean washcloth. 5.  Apply the CHG Soap to your body ONLY FROM THE NECK DOWN.  Do not use on open wounds or open sores.  Avoid contact with your eyes, ears, mouth and genitals (private parts).  Wash genitals (private parts) with your normal soap.  6.  Wash thoroughly, paying special attention to the area where your surgery will be performed.  7.  Thoroughly rise your body with warm water from the neck down.   8.  DO NOT shower/wash with your normal soap after using and rinsing off the CHG soap.  9.  Pat yourself dry with a clean towel.           10.  Wear clean pajamas.           11.  Place clean  sheets on your bed the night of your first shower and do not sleep with pets.  Day of Surgery: Do not apply any deodorants/lotions.  Please wear clean clothes to the hospital/surgery center.        Signed, Sanda Klein, MD  02/16/2020 11:40 AM    [facility]

## 2020-02-16 NOTE — Progress Notes (Signed)
Cardiology Office Note:    Date:  02/16/2020   ID:  Alexandria French, DOB 12/24/1950, MRN [id number]  PCP:  Alexandria Percy, MD  [facility] Cardiologist:  Alexandria French [facility] Electrophysiologist:  None   Referring MD: Alexandria Percy, MD   Chief Complaint  Patient presents with  . Loss of Consciousness    History of Present Illness:    Alexandria French is a 69 y.o. 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  However due to bradycardia she can only tolerate a very low dose of beta-blocker.  Her palpitations are much better since starting the bisoprolol, but she is frequently quite bradycardic in the 40s.  Usually she tolerates this well, but it is possible that she had a brief syncopal event lying in bed a couple of weeks ago.  She remembers everything turning white and does not remember her husband calling out her name.  There were no associated palpitations.  According to him she recovered consciousness after a couple of seconds.  She has not had any chest discomfort and has not had falls.  Denies focal neurological complaints, edema or claudication.  Because of iron deficiency she underwent a colonoscopy with Dr. Rocco French at [facility], but this was an incomplete procedure due to steep angulation of the colon.  She often has elevated systolic blood pressure in the 150-160 range, but her diastolic blood pressure is quite low typically in the 50s.  The pulse pressure is particularly brought no more bradycardic she is.  She wore an event monitor that showed prominent sinus bradycardia and chronotropic incompetence, with a maximum heart rate but only reached 60% of max predicted.  She did have  frequent mostly monomorphic PVCs (6% of all beats).  Also had some brief bursts of nonsustained atrial tachycardia.  There was no complex ventricular arrhythmia.  She has not had full syncope previously or documented VT. There is no family history of HCM, VT, sudden death or CHF.  2 separate echocardiograms performed in August 2020 in May 2021 show findings consistent with hypertrophic obstructive cardiomyopathy, although the degree of left ventricular hypertrophy is relatively mild and appears to be concentric rather than asymmetrical.  Systolic anterior motion of the mitral valve with increased gradients across the left ventricular outflow tract.  These have been calculated at 145 mmHg and 213 mmHg respectively, although I am not sure that the estimation is accurate.  There seems to be a lot of contamination with mitral regurgitant flow on the Doppler signal.  There is however convincing evidence of gradients across the LVOT of at least 50 mmHg at rest.  On both studies the left ventricular systolic function is hyperdynamic with ejection fraction in excess of 75%.  There is no evidence of major abnormalities of the aortic valve itself.  Coronary CT angiogram performed in September 2020 showed a calcium score in the 85th percentile with no major coronary artery stenoses.  The coronary system is left dominant.  There is very mild bilateral carotid artery plaque with stenoses less than 40%.  Atrial fibrillation was reportedly detected on a previous cardiac monitor in June 2019.  The ventricular rate was over 178 bpm.  The episode was associated with dizziness and lightheadedness.  I am not sure it can accurately be categorized as  atrial fibrillation since it was so brief, but she definitely had at least paroxysmal atrial tachycardia.  Although frequent PVCs including ventricular bigeminy were recorded, there was no evidence of ventricular tachycardia..  She has a history of essential hypertension which is  currently being managed with high-dose lisinopril and a low-dose of bisoprolol.  Past Medical History:  Diagnosis Date  . A-fib ([place])   . CAD (coronary artery disease)   . Esophageal reflux    no meds  . Hypertension   . Other and unspecified hyperlipidemia     Past Surgical History:  Procedure Laterality Date  . ABDOMINAL HYSTERECTOMY  08/2008  . BLADDER REPAIR    . TONSILLECTOMY      Current Medications: Current Meds  Medication Sig  . amLODipine (NORVASC) 2.5 MG tablet Take 1 tablet (2.5 mg total) by mouth daily.  Marland Kitchen apixaban (ELIQUIS) 5 MG TABS tablet Take 1 tablet (5 mg total) by mouth 2 (two) times daily.  . bisoprolol (ZEBETA) 5 MG tablet Take 0.5 tablets (2.5 mg total) by mouth daily.  . Calcium Carbonate-Vitamin D (CALTRATE 600+D) 600-400 MG-UNIT per tablet Take 1 tablet by mouth 2 (two) times daily.   . cetirizine (ZYRTEC) 10 MG tablet Take 10 mg by mouth daily.  . Coenzyme Q10 (COQ-10) 100 MG CAPS Take 1 capsule by mouth daily.  . ferrous sulfate 325 (65 FE) MG tablet Take 325 mg by mouth daily with breakfast.  . fluticasone (FLONASE) 50 MCG/ACT nasal spray Place 2 sprays into both nostrils daily.  Marland Kitchen lisinopril (ZESTRIL) 20 MG tablet Take 1 tablet (20 mg total) by mouth 2 (two) times daily.  Marland Kitchen LORazepam (ATIVAN) 1 MG tablet Take 1 mg by mouth daily as needed for anxiety.  Marland Kitchen omega-3 acid ethyl esters (LOVAZA) 1 g capsule Take by mouth 2 (two) times daily.  . simvastatin (ZOCOR) 20 MG tablet Take 20 mg by mouth daily.  . vitamin B-12 (CYANOCOBALAMIN) 250 MCG tablet Take 250 mcg by mouth 2 (two) times a week.   . vitamin C (ASCORBIC ACID) 250 MG tablet Take 500 mg by mouth daily.     Allergies:   Cefzil [cefprozil]   Social History   Socioeconomic History  . Marital status: Married    Spouse name: Not on file  . Number of children: Not on file  . Years of education: Not on file  . Highest education level: Not on file  Occupational History  . Not on file   Tobacco Use  . Smoking status: Never Smoker  . Smokeless tobacco: Never Used  Substance and Sexual Activity  . Alcohol use: No    Alcohol/week: 0.0 standard drinks  . Drug use: No  . Sexual activity: Not on file  Other Topics Concern  . Not on file  Social History Narrative  . Not on file   Social Determinants of Health   Financial Resource Strain:   . Difficulty of Paying Living Expenses: Not on file  Food Insecurity:   . Worried About Charity fundraiser in the Last Year: Not on file  . Ran Out of Food in the Last Year: Not on file  Transportation Needs:   . Lack of Transportation (Medical): Not on file  . Lack of Transportation (Non-Medical): Not on file  Physical Activity:   . Days of Exercise per Week: Not on file  . Minutes of Exercise per Session: Not on file  Stress:   . Feeling of Stress : Not on file  Social Connections:   . Frequency of Communication with Friends and Family: Not on file  . Frequency of Social Gatherings with Friends and Family: Not on file  . Attends Religious Services: Not on file  . Active Member of Clubs or Organizations: Not on file  . Attends Archivist Meetings: Not on file  . Marital Status: Not on file     Family History: The patient's family history includes Arthritis in an other family member; Heart disease in an other family member; Osteoporosis in an other family member. There is no history of Colon cancer or Colon polyps.  Father lived to 73 years old, has an older sister 24 years old who is healthy, had another sister died at age 28 of a lung infection.  1 child (son), healthy.  ROS:   Please see the history of present illness. All other systems are reviewed and are negative.    EKGs/Labs/Other Studies Reviewed:    The following studies were reviewed today: Echocardiogram 08/27/2019  1. LVOT peak gradient 213 mmHg. Left ventricular ejection fraction, by estimation, is 70 to 75%. The left ventricle has hyperdynamic  function. The left ventricle has no regional wall motion abnormalities. There is mild concentric left ventricular hypertrophy. Left ventricular diastolic parameters are consistent with Grade I diastolic dysfunction (impaired relaxation). Elevated left atrial pressure. 2. Right ventricular systolic function is normal. The right ventricular size is normal. There is normal pulmonary artery systolic pressure. 3. The mitral valve is grossly normal. Trivial mitral valve regurgitation. 4. The aortic valve is tricuspid. Aortic valve regurgitation is mild. No aortic stenosis is present. 5. The inferior vena cava is normal in size with greater than 50% respiratory variability, suggesting right atrial pressure of 3 mmHg.   Echocardiogram 11/26/2018: 1. The left ventricle has a visually estimated ejection fraction of 75%.  The cavity size was normal. Left ventricular diastolic Doppler parameters  are consistent with impaired relaxation. Elevated left ventricular  end-diastolic pressure No evidence of  left ventricular regional wall motion abnormalities.  2. The right ventricle has normal systolic function. The cavity was  normal. There is no increase in right ventricular wall thickness.  3. The aortic valve is tricuspid. Mild thickening of the aortic valve.  Aortic valve regurgitation is mild by color flow Doppler. Mild aortic  annular calcification noted.  4. The mitral valve is abnormal. Moderate thickening of the mitral valve  leaflet. Mitral valve regurgitation is moderate by color flow Doppler.  5. Mitral chordal SAM is noted. LVOT is small. Peak gradient 145 mmHg.  6. The tricuspid valve is grossly normal. Tricuspid valve regurgitation  is moderate.  7. The aorta is normal unless otherwise noted.   Coronary CT angiography 12/03/2018: FINDINGS: Coronary calcium score: The patient's coronary artery calcium score is 149, which places the patient in the 85th percentile. Coronary  arteries: Normal coronary origins. Left dominance Right Coronary Artery: Small, non-dominant vessel which gives off a larger AV nodal branch. No obstructive disease. Left Main Coronary Artery: Prominent left coronary cusp, but not anuerymsal. No stenosis. Gives off larger LAD and LCx arteries. Left Anterior Descending Coronary Artery: Calcified proximal vessel with mild 25-49% (CADRADS2) mixed stenosis. Gives off several small diagonal branches. Ramus Intermedius Artery: Small branch without significant stenosis.  Left Circumflex Artery: Dominant vessel that gives off a small PDA artery. Larger caliber with minimal mixed 1-24% mid-vessel stenosis (CADRADS1).  Aorta: Normal size, 28 mm at the mid ascending aorta (level of the PA bifurcation) measured double  oblique. Atherosclerosis. No dissection. Aortic Valve: Trileaflet. Calcification of the annulus.  Other findings: Thickening of the aortic and mitral valve leaflets. Normal biatrial size Normal pulmonary vein drainage into the left atrium. Normal left atrial appendage without a thrombus. Normal size of the pulmonary artery.  IMPRESSION: 1. Mild, non-obstructive CAD primarily of the LAD, CADRADS = 2. 2. Coronary calcium score of 149. This was 85th percentile for age and sex matched control. 3. Normal coronary origin with left dominance. 4. Thickening of the aortic and mitral valve leaflets  MCOT monitor 07/14/2017  Sinus rhythm with frequent PVCs. There was one episode of rapid atrial fibrillation.   EKG:  EKG is ordered today and shows sinus bradycardia, left atrial abnormality and left bundle branch block. Recent Labs: 12/07/2019: Hemoglobin 12.4  10/07/2019 Creatinine 0.86, glucose 92, potassium 4.0, normal liver function test Hemoglobin 8.7 with low MCV TSH 3.600 Recent Lipid Panel No results found for: CHOL, TRIG, HDL, CHOLHDL, VLDL, LDLCALC, LDLDIRECT 10/07/2019 Total cholesterol 171, HDL 56, LDL 86, triglycerides  145 Physical Exam:    VS:  BP (!) 176/70   Pulse 67   Ht 5\' 2"  (1.575 m)   Wt 136 lb (61.7 kg)   SpO2 98%   BMI 24.87 kg/m     Wt Readings from Last 3 Encounters:  02/16/20 136 lb (61.7 kg)  12/07/19 138 lb 12.8 oz (63 kg)  11/03/19 139 lb (63 kg)      General: Alert, oriented x3, no distress, appears well Head: no evidence of trauma, PERRL, EOMI, no exophtalmos or lid lag, no myxedema, no xanthelasma; normal ears, nose and oropharynx Neck: normal jugular venous pulsations and no hepatojugular reflux; brisk carotid pulses without delay and no carotid bruits Chest: clear to auscultation, no signs of consolidation by percussion or palpation, normal fremitus, symmetrical and full respiratory excursions Cardiovascular: normal position and quality of the apical impulse, regular rhythm, normal first and second heart sounds, no murmurs, rubs or gallops.  A systolic murmur is readily inducible with a Valsalva maneuver Abdomen: no tenderness or distention, no masses by palpation, no abnormal pulsatility or arterial bruits, normal bowel sounds, no hepatosplenomegaly Extremities: no clubbing, cyanosis or edema; 2+ radial, ulnar and brachial pulses bilaterally; 2+ right femoral, posterior tibial and dorsalis pedis pulses; 2+ left femoral, posterior tibial and dorsalis pedis pulses; no subclavian or femoral bruits Neurological: grossly nonfocal Psych: Normal mood and affect'  ASSESSMENT:    1. HOCM (hypertrophic obstructive cardiomyopathy) ([place])   2. SSS (sick sinus syndrome) ([place])   3. Essential hypertension   4. Paroxysmal atrial fibrillation (H[facility])   5. Long term (current) use of anticoagulants   6. Elevated coronary artery calcium score   7. Hypercholesterolemia   8. Pre-op evaluation    PLAN:    In order of problems listed above:  1. HOCM: Both her physical exam and her echocardiograms are highly suggestive of hypertrophic obstructive cardiomyopathy.  Try to avoid diuretics and  potent vasodilators.  Only can take a low-dose of beta-blocker due to underlying bradycardia.  Following pacemaker implantation which should be able to increase the beta-blocker dose to help suppress the frequent PVCs and reduce the likelihood of outflow tract obstruction.  If necessary, we can also shorten the AV delay to perform committed ventricular pacing, but this may not be necessary once we have her on higher beta-blocker doses.  2.  SSS: Especially with a recent syncopal event, recommend pacemaker implantation.  This will allow higher beta-blocker doses as well as  treatment of her symptoms of chronotropic incompetence.  The procedure was discussed in detail with the patient and her husband.  Potential complications and side effects of dual-chamber permanent pacemaker were reviewed in detail. This procedure has been fully reviewed with the patient and written informed consent has been obtained. We will go ahead with a pacemaker with minute ventilation sensor which should provide the best treatment for her complaints. 3. HTN: After pacemaker implantation will increase her dose of bisoprolol.  Trying to avoid diuretics and potent vasodilators. 4. AFib: I am not confident that she ever had paroxysmal atrial fibrillation, but she definitely has episodes of paroxysmal atrial tachycardia and due to her underlying structural heart disease is at high risk for developing atrial fibrillation.  So far she is tolerating anticoagulation well and at least clinically her anemia seems to be improving.  The pacemaker will provide much better monitoring for recurrence of possible atrial fibrillation. 5. Anticoagulation: Most recent hemoglobin level was 12.4 on December 07, 2019.  At her request offered contact information for [facility].  She would like to consider going ahead with a complete colonoscopy.  Continue iron supplements.  Looks less pale today. 6. Elevated coronary calcium score: Does not have angina  pectoris.  She does not have angina pectoris at rest or with activity.  No evidence of ischemia by nuclear stress testing.  Focus on risk factor modification.  Not on aspirin due to full anticoagulation. 7. HLP: Target LDL cholesterol should ideally be less than 70.  Was 38 on most recent assessment, improving. On statin   Medication Adjustments/Labs and Tests Ordered: Current medicines are reviewed at length with the patient today.  Concerns regarding medicines are outlined above.  Orders Placed This Encounter  Procedures  . Basic metabolic panel  . CBC   No orders of the defined types were placed in this encounter.   Patient Instructions  Medications: Your physician recommends that you continue on your current medications as directed. Please refer to the Current Medication list given to you today.     * If you need a refill on your cardiac medications before your next appointment, please call your pharmacy. *  Testing/Procedures: Your physician has recommended that you have a pacemaker inserted. A pacemaker is a small device that is placed under the skin of your chest or abdomen to help control abnormal heart rhythms. This device uses electrical pulses to prompt the heart to beat at a normal rate. Pacemakers are used to treat heart rhythms that are too slow. Wire (leads) are attached to the pacemaker that goes into the chambers of you heart. This is done in the hospital and usually requires and overnight stay. Please follow the instructions below, located under the special instructions section.  Follow-Up: Your physician recommends that you schedule a wound check appointment 10-14 days, after your procedure on 02/28/20, with the device clinic.   Your physician recommends that you schedule a follow up appointment in 91 days, after your procedure on 02/28/20, with Dr. Sallyanne Kuster.  Thank you for choosing [facility]!!      Any Other Special Instructions Will Be Listed Below (If  Applicable).     Implantable Device Instructions  You are scheduled for Permanent Transvenous Pacemaker  on  02/28/20  with Dr. Sallyanne Kuster.  1.   Please arrive at the [facility], Entrance "A"  at [facility] at  11:30 am on the day of your procedure. (The address is [address]  2.  Do not eat or drink after midnight the night before your procedure.  3.  You will need to have the coronavirus test completed prior to your procedure. An appointment has been made at 2:10 pm on 02/25/20. This is a Drive Up Visit at [address], [place], [place] [address]. Please tell them that you are there for procedure testing. Stay in your car and someone will be with you shortly. Please make sure to have all other labs completed before this test because you will need to stay quarantined until your procedure.  4. Hold the Eliquis for two days prior (Starting Saturday 02/26/20)  5.  Plan for an overnight stay.  Bring your insurance cards and a list of you medications.  6.  Wash your chest and neck with surgical scrub the evening before and the morning of your procedure.  Rinse well. Please review the surgical scrub instruction sheet given to you. You may pick this up at the [facility] office or any drug store.  7. Your chest will need to be shaved prior to this procedure (if needed). We ask that you do this yourself at home 1 to 2 days before or if uncomfortable/unable to do yourself, then it will be performed by the hospital staff the day of.                                                                                                                * If you have ant questions after you get home, please call Lattie Haw, RN at [phone]  * Every attempt is made to prevent procedures from being rescheduled.  Due to the nature of  Electrophysiology, rescheduling can happen.  The physician is always aware and directs the staff when this occurs.     Pacemaker Implantation,  Adult Pacemaker implantation is a procedure to place a pacemaker inside your chest. A pacemaker is a small computer that sends electrical signals to the heart and helps your heart beat normally. A pacemaker also stores information about your heart rhythms. You may need pacemaker implantation if you:  Have a slow heartbeat (bradycardia).  Faint (syncope).  Have shortness of breath (dyspnea) due to heart problems.  The pacemaker attaches to your heart through a wire, called a lead. Sometimes just one lead is needed. Other times, there will be two leads. There are two types of pacemakers:  Transvenous pacemaker. This type is placed under the skin or muscle of your chest. The lead goes through a vein in the chest area to reach the inside of the heart.  Epicardial pacemaker. This type is placed under the skin or muscle of your chest or belly. The lead goes through your chest to the outside of the heart.  Tell a health care provider about:  Any allergies you have.  All medicines you are taking, including vitamins, herbs, eye drops, creams, and over-the-counter medicines.  Any problems you or family members have had with anesthetic medicines.  Any blood or bone disorders you have.  Any surgeries you  have had.  Any medical conditions you have.  Whether you are pregnant or may be pregnant. What are the risks? Generally, this is a safe procedure. However, problems may occur, including:  Infection.  Bleeding.  Failure of the pacemaker or the lead.  Collapse of a lung or bleeding into a lung.  Blood clot inside a blood vessel with a lead.  Damage to the heart.  Infection inside the heart (endocarditis).  Allergic reactions to medicines.  What happens before the procedure? Staying hydrated Follow instructions from your health care provider about hydration, which may include:  Up to 2 hours before the procedure - you may continue to drink clear liquids, such as water, clear  fruit juice, black coffee, and plain tea.  Eating and drinking restrictions Follow instructions from your health care provider about eating and drinking, which may include:  8 hours before the procedure - stop eating heavy meals or foods such as meat, fried foods, or fatty foods.  6 hours before the procedure - stop eating light meals or foods, such as toast or cereal.  6 hours before the procedure - stop drinking milk or drinks that contain milk.  2 hours before the procedure - stop drinking clear liquids.  Medicines  Ask your health care provider about: ? Changing or stopping your regular medicines. This is especially important if you are taking diabetes medicines or blood thinners. ? Taking medicines such as aspirin and ibuprofen. These medicines can thin your blood. Do not take these medicines before your procedure if your health care provider instructs you not to.  You may be given antibiotic medicine to help prevent infection. General instructions  You will have a heart evaluation. This may include an electrocardiogram (ECG), chest X-ray, and heart imaging (echocardiogram,  or echo) tests.  You will have blood tests.  Do not use any products that contain nicotine or tobacco, such as cigarettes and e-cigarettes. If you need help quitting, ask your health care provider.  Plan to have someone take you home from the hospital or clinic.  If you will be going home right after the procedure, plan to have someone with you for 24 hours.  Ask your health care provider how your surgical site will be marked or identified. What happens during the procedure?  To reduce your risk of infection: ? Your health care team will wash or sanitize their hands. ? Your skin will be washed with soap. ? Hair may be removed from the surgical area.  An IV tube will be inserted into one of your veins.  You will be given one or more of the following: ? A medicine to help you relax (sedative). ? A  medicine to numb the area (local anesthetic). ? A medicine to make you fall asleep (general anesthetic).  If you are getting a transvenous pacemaker: ? An incision will be made in your upper chest. ? A pocket will be made for the pacemaker. It may be placed under the skin or between layers of muscle. ? The lead will be inserted into a blood vessel that returns to the heart. ? While X-rays are taken by an imaging machine (fluoroscopy), the lead will be advanced through the vein to the inside of your heart. ? The other end of the lead will be tunneled under the skin and attached to the pacemaker.  If you are getting an epicardial pacemaker: ? An incision will be made near your ribs or breastbone (sternum) for the lead. ? The  lead will be attached to the outside of your heart. ? Another incision will be made in your chest or upper belly to create a pocket for the pacemaker. ? The free end of the lead will be tunneled under the skin and attached to the pacemaker.  The transvenous or epicardial pacemaker will be tested. Imaging studies may be done to check the lead position.  The incisions will be closed with stitches (sutures), adhesive strips, or skin glue.  Bandages (dressing) will be placed over the incisions. The procedure may vary among health care providers and hospitals. What happens after the procedure?  Your blood pressure, heart rate, breathing rate, and blood oxygen level will be monitored until the medicines you were given have worn off.  You will be given antibiotics and pain medicine.  ECG and chest x-rays will be done.  You will wear a continuous type of ECG (Holter monitor) to check your heart rhythm.  Your health care provider will program the pacemaker.  Do not drive for 24 hours if you received a sedative. This information is not intended to replace advice given to you by your health care provider. Make sure you discuss any questions you have with your health care  provider. Document Released: 03/08/2002 Document Revised: 10/06/2015 Document Reviewed: 08/30/2015 Elsevier Interactive Patient Education  2018 [employer].     Pacemaker Implantation, Adult, Care After This sheet gives you information about how to care for yourself after your procedure. Your health care provider may also give you more specific instructions. If you have problems or questions, contact your health care provider. What can I expect after the procedure? After the procedure, it is common to have:  Mild pain.  Slight bruising.  Some swelling over the incision.  A slight bump over the skin where the device was placed. Sometimes, it is possible to feel the device under the skin. This is normal.  Follow these instructions at home: Medicines  Take over-the-counter and prescription medicines only as told by your health care provider.  If you were prescribed an antibiotic medicine, take it as told by your health care provider. Do not stop taking the antibiotic even if you start to feel better. Wound care  Do not remove the bandage on your chest until directed to do so by your health care provider.  After your bandage is removed, you may see pieces of tape called skin adhesive strips over the area where the cut was made (incision site). Let them fall off on their own.  Check the incision site every day to make sure it is not infected, bleeding, or starting to pull apart.  Do not use lotions or ointments near the incision site unless directed to do so.  Keep the incision area clean and dry for 2-3 days after the procedure or as directed by your health care provider. It takes several weeks for the incision site to completely heal.  Do not take baths, swim, or use a hot tub for 7-10 days or as otherwise directed by your health care provider. Activity  Do not drive or use heavy machinery while taking prescription pain medicine.  Do not drive for 24 hours if you were given  a medicine to help you relax (sedative).  Check with your health care provider before you start to drive or play sports.  Avoid sudden jerking, pulling, or chopping movements that pull your upper arm far away from your body. Avoid these movements for at least 6 weeks or as  long as told by your health care provider.  Do not lift your upper arm above your shoulders for at least 6 weeks or as long as told by your health care provider. This means no tennis, golf, or swimming.  You may go back to work when your health care provider says it is okay. Pacemaker care  You may be shown how to transfer data from your pacemaker through the phone to your health care provider.  Always let all health care providers know about your pacemaker before you have any medical procedures or tests.  Wear a medical ID bracelet or necklace stating that you have a pacemaker. Carry a pacemaker ID card with you at all times.  Your pacemaker battery will last for 5-15 years. Routine checks by your health care provider will let the health care provider know when the battery is starting to run down. The pacemaker will need to be replaced when the battery starts to run down.  Do not use amateur [employer]. Other electrical devices are safe to use, including power tools, lawn mowers, and speakers. If you are unsure of whether something is safe to use, ask your health care provider.  When using your cell phone, hold it to the ear opposite the pacemaker. Do not leave your cell phone in a pocket over the pacemaker.  Avoid places or objects that have a strong electric or magnetic field, including: ? [employer]. When at the airport, let officials know that you have a pacemaker. ? Power plants. ? Large electrical generators. ? Radiofrequency transmission towers, such as cell phone and radio towers. General instructions  Weigh yourself every day. If you suddenly gain weight, fluid  may be building up in your body.  Keep all follow-up visits as told by your health care provider. This is important. Contact a health care provider if:  You gain weight suddenly.  Your legs or feet swell.  It feels like your heart is fluttering or skipping beats (heart palpitations).  You have chills or a fever.  You have more redness, swelling, or pain around your incisions.  You have more fluid or blood coming from your incisions.  Your incisions feel warm to the touch.  You have pus or a bad smell coming from your incisions. Get help right away if:  You have chest pain.  You have trouble breathing or are short of breath.  You become extremely tired.  You are light-headed or you faint. This information is not intended to replace advice given to you by your health care provider. Make sure you discuss any questions you have with your health care provider. Document Released: 10/05/2004 Document Revised: 12/29/2015 Document Reviewed: 12/29/2015 Elsevier Interactive Patient Education  2018 [employer] Discharge Instructions for  Pacemaker/Defibrillator Patients  ACTIVITY No heavy lifting or vigorous activity with your left/right arm for 6 to 8 weeks.  Do not raise your left/right arm above your head for one week.  Gradually raise your affected arm as drawn below.           __  NO DRIVING for     ; you may begin driving on     .  WOUND CARE - Keep the wound area clean and dry.  Do not get this area wet for one week. No showers for one week; you may shower on     . - The tape/steri-strips on your wound will fall off; do not pull  them off.  No bandage is needed on the site.  DO  NOT apply any creams, oils, or ointments to the wound area. - If you notice any drainage or discharge from the wound, any swelling or bruising at the site, or you develop a fever > 101? F after you are discharged home, call the office at once.  SPECIAL INSTRUCTIONS - You are  still able to use cellular telephones; use the ear opposite the side where you have your pacemaker/defibrillator.  Avoid carrying your cellular phone near your device. - When traveling through airports, show security personnel your identification card to avoid being screened in the metal detectors.  Ask the security personnel to use the hand wand. - Avoid arc welding equipment, MRI testing (magnetic resonance imaging), TENS units (transcutaneous nerve stimulators).  Call the office for questions about other devices. - Avoid electrical appliances that are in poor condition or are not properly grounded. - Microwave ovens are safe to be near or to operate.  ADDITIONAL INFORMATION FOR DEFIBRILLATOR PATIENTS SHOULD YOUR DEVICE GO OFF: - If your device goes off ONCE and you feel fine afterward, notify the device clinic nurses. - If your device goes off ONCE and you do not feel well afterward, call 911. - If your device goes off TWICE, call 911. - If your device goes off [place], call 911.  DO NOT DRIVE YOURSELF OR A FAMILY MEMBER WITH A DEFIBRILLATOR TO THE HOSPITAL--CALL 911.    [facility] - Preparing For Surgery  Before surgery, you can play an important role. Because skin is not sterile, your skin needs to be as free of germs as possible. You can reduce the number of germs on your skin by washing with CHG (chlorahexidine gluconate) Soap before surgery.  CHG is an antiseptic cleaner which kills germs and bonds with the skin to continue killing germs even after washing.   Please do not use if you have an allergy to CHG or antibacterial soaps.  If your skin becomes reddened/irritated stop using the CHG.   Do not shave (including legs and underarms) for at least 48 hours prior to first CHG shower.  It is OK to shave your face.  Please follow these instructions carefully:  1.  Shower the night before surgery and the morning of surgery with CHG.  2.  If you choose to wash your hair,  wash your hair first as usual with your normal shampoo.  3.  After you shampoo, rinse your hair and body thoroughly to remove the shampoo.  4.  Use CHG as you would any other liquid soap.  You can apply CHG directly to the skin and wash gently with a clean washcloth. 5.  Apply the CHG Soap to your body ONLY FROM THE NECK DOWN.  Do not use on open wounds or open sores.  Avoid contact with your eyes, ears, mouth and genitals (private parts).  Wash genitals (private parts) with your normal soap.  6.  Wash thoroughly, paying special attention to the area where your surgery will be performed.  7.  Thoroughly rise your body with warm water from the neck down.   8.  DO NOT shower/wash with your normal soap after using and rinsing off the CHG soap.  9.  Pat yourself dry with a clean towel.           10.  Wear clean pajamas.           11.  Place clean  sheets on your bed the night of your first shower and do not sleep with pets.  Day of Surgery: Do not apply any deodorants/lotions.  Please wear clean clothes to the hospital/surgery center.        Signed, Sanda Klein, MD  02/16/2020 11:40 AM    [facility]

## 2020-02-16 NOTE — Patient Instructions (Signed)
Medications: Your physician recommends that you continue on your current medications as directed. Please refer to the Current Medication list given to you today.     * If you need a refill on your cardiac medications before your next appointment, please call your pharmacy. *  Testing/Procedures: Your physician has recommended that you have a pacemaker inserted. A pacemaker is a small device that is placed under the skin of your chest or abdomen to help control abnormal heart rhythms. This device uses electrical pulses to prompt the heart to beat at a normal rate. Pacemakers are used to treat heart rhythms that are too slow. Wire (leads) are attached to the pacemaker that goes into the chambers of you heart. This is done in the hospital and usually requires and overnight stay. Please follow the instructions below, located under the special instructions section.  Follow-Up: Your physician recommends that you schedule a wound check appointment 10-14 days, after your procedure on 02/28/20, with the device clinic.   Your physician recommends that you schedule a follow up appointment in 91 days, after your procedure on 02/28/20, with Dr. Sallyanne Kuster.  Thank you for choosing [facility]!!      Any Other Special Instructions Will Be Listed Below (If Applicable).     Implantable Device Instructions  You are scheduled for Permanent Transvenous Pacemaker  on  02/28/20  with Dr. Sallyanne Kuster.  1.   Please arrive at the [facility], Entrance "A"  at [facility] at  11:30 am on the day of your procedure. (The address is [address]  2. Do not eat or drink after midnight the night before your procedure.  3.  You will need to have the coronavirus test completed prior to your procedure. An appointment has been made at 2:10 pm on 02/25/20. This is a Drive Up Visit at [address], [place], [place] [address]. Please tell them that you are there for procedure testing. Stay in  your car and someone will be with you shortly. Please make sure to have all other labs completed before this test because you will need to stay quarantined until your procedure.  4. Hold the Eliquis for two days prior (Starting Saturday 02/26/20)  5.  Plan for an overnight stay.  Bring your insurance cards and a list of you medications.  6.  Wash your chest and neck with surgical scrub the evening before and the morning of your procedure.  Rinse well. Please review the surgical scrub instruction sheet given to you. You may pick this up at the [facility] office or any drug store.  7. Your chest will need to be shaved prior to this procedure (if needed). We ask that you do this yourself at home 1 to 2 days before or if uncomfortable/unable to do yourself, then it will be performed by the hospital staff the day of.                                                                                                                *  If you have ant questions after you get home, please call Lattie Haw, RN at [phone]  * Every attempt is made to prevent procedures from being rescheduled.  Due to the nature of  Electrophysiology, rescheduling can happen.  The physician is always aware and directs the staff when this occurs.     Pacemaker Implantation, Adult Pacemaker implantation is a procedure to place a pacemaker inside your chest. A pacemaker is a small computer that sends electrical signals to the heart and helps your heart beat normally. A pacemaker also stores information about your heart rhythms. You may need pacemaker implantation if you:  Have a slow heartbeat (bradycardia).  Faint (syncope).  Have shortness of breath (dyspnea) due to heart problems.  The pacemaker attaches to your heart through a wire, called a lead. Sometimes just one lead is needed. Other times, there will be two leads. There are two types of pacemakers:  Transvenous pacemaker. This type is placed under the skin or muscle of  your chest. The lead goes through a vein in the chest area to reach the inside of the heart.  Epicardial pacemaker. This type is placed under the skin or muscle of your chest or belly. The lead goes through your chest to the outside of the heart.  Tell a health care provider about:  Any allergies you have.  All medicines you are taking, including vitamins, herbs, eye drops, creams, and over-the-counter medicines.  Any problems you or family members have had with anesthetic medicines.  Any blood or bone disorders you have.  Any surgeries you have had.  Any medical conditions you have.  Whether you are pregnant or may be pregnant. What are the risks? Generally, this is a safe procedure. However, problems may occur, including:  Infection.  Bleeding.  Failure of the pacemaker or the lead.  Collapse of a lung or bleeding into a lung.  Blood clot inside a blood vessel with a lead.  Damage to the heart.  Infection inside the heart (endocarditis).  Allergic reactions to medicines.  What happens before the procedure? Staying hydrated Follow instructions from your health care provider about hydration, which may include:  Up to 2 hours before the procedure - you may continue to drink clear liquids, such as water, clear fruit juice, black coffee, and plain tea.  Eating and drinking restrictions Follow instructions from your health care provider about eating and drinking, which may include:  8 hours before the procedure - stop eating heavy meals or foods such as meat, fried foods, or fatty foods.  6 hours before the procedure - stop eating light meals or foods, such as toast or cereal.  6 hours before the procedure - stop drinking milk or drinks that contain milk.  2 hours before the procedure - stop drinking clear liquids.  Medicines  Ask your health care provider about: ? Changing or stopping your regular medicines. This is especially important if you are taking  diabetes medicines or blood thinners. ? Taking medicines such as aspirin and ibuprofen. These medicines can thin your blood. Do not take these medicines before your procedure if your health care provider instructs you not to.  You may be given antibiotic medicine to help prevent infection. General instructions  You will have a heart evaluation. This may include an electrocardiogram (ECG), chest X-ray, and heart imaging (echocardiogram,  or echo) tests.  You will have blood tests.  Do not use any products that contain nicotine or tobacco, such as cigarettes and e-cigarettes. If  you need help quitting, ask your health care provider.  Plan to have someone take you home from the hospital or clinic.  If you will be going home right after the procedure, plan to have someone with you for 24 hours.  Ask your health care provider how your surgical site will be marked or identified. What happens during the procedure?  To reduce your risk of infection: ? Your health care team will wash or sanitize their hands. ? Your skin will be washed with soap. ? Hair may be removed from the surgical area.  An IV tube will be inserted into one of your veins.  You will be given one or more of the following: ? A medicine to help you relax (sedative). ? A medicine to numb the area (local anesthetic). ? A medicine to make you fall asleep (general anesthetic).  If you are getting a transvenous pacemaker: ? An incision will be made in your upper chest. ? A pocket will be made for the pacemaker. It may be placed under the skin or between layers of muscle. ? The lead will be inserted into a blood vessel that returns to the heart. ? While X-rays are taken by an imaging machine (fluoroscopy), the lead will be advanced through the vein to the inside of your heart. ? The other end of the lead will be tunneled under the skin and attached to the pacemaker.  If you are getting an epicardial pacemaker: ? An incision  will be made near your ribs or breastbone (sternum) for the lead. ? The lead will be attached to the outside of your heart. ? Another incision will be made in your chest or upper belly to create a pocket for the pacemaker. ? The free end of the lead will be tunneled under the skin and attached to the pacemaker.  The transvenous or epicardial pacemaker will be tested. Imaging studies may be done to check the lead position.  The incisions will be closed with stitches (sutures), adhesive strips, or skin glue.  Bandages (dressing) will be placed over the incisions. The procedure may vary among health care providers and hospitals. What happens after the procedure?  Your blood pressure, heart rate, breathing rate, and blood oxygen level will be monitored until the medicines you were given have worn off.  You will be given antibiotics and pain medicine.  ECG and chest x-rays will be done.  You will wear a continuous type of ECG (Holter monitor) to check your heart rhythm.  Your health care provider will program the pacemaker.  Do not drive for 24 hours if you received a sedative. This information is not intended to replace advice given to you by your health care provider. Make sure you discuss any questions you have with your health care provider. Document Released: 03/08/2002 Document Revised: 10/06/2015 Document Reviewed: 08/30/2015 Elsevier Interactive Patient Education  2018 [employer].     Pacemaker Implantation, Adult, Care After This sheet gives you information about how to care for yourself after your procedure. Your health care provider may also give you more specific instructions. If you have problems or questions, contact your health care provider. What can I expect after the procedure? After the procedure, it is common to have:  Mild pain.  Slight bruising.  Some swelling over the incision.  A slight bump over the skin where the device was placed. Sometimes, it is  possible to feel the device under the skin. This is normal.  Follow these instructions  at home: Medicines  Take over-the-counter and prescription medicines only as told by your health care provider.  If you were prescribed an antibiotic medicine, take it as told by your health care provider. Do not stop taking the antibiotic even if you start to feel better. Wound care  Do not remove the bandage on your chest until directed to do so by your health care provider.  After your bandage is removed, you may see pieces of tape called skin adhesive strips over the area where the cut was made (incision site). Let them fall off on their own.  Check the incision site every day to make sure it is not infected, bleeding, or starting to pull apart.  Do not use lotions or ointments near the incision site unless directed to do so.  Keep the incision area clean and dry for 2-3 days after the procedure or as directed by your health care provider. It takes several weeks for the incision site to completely heal.  Do not take baths, swim, or use a hot tub for 7-10 days or as otherwise directed by your health care provider. Activity  Do not drive or use heavy machinery while taking prescription pain medicine.  Do not drive for 24 hours if you were given a medicine to help you relax (sedative).  Check with your health care provider before you start to drive or play sports.  Avoid sudden jerking, pulling, or chopping movements that pull your upper arm far away from your body. Avoid these movements for at least 6 weeks or as long as told by your health care provider.  Do not lift your upper arm above your shoulders for at least 6 weeks or as long as told by your health care provider. This means no tennis, golf, or swimming.  You may go back to work when your health care provider says it is okay. Pacemaker care  You may be shown how to transfer data from your pacemaker through the phone to your health care  provider.  Always let all health care providers know about your pacemaker before you have any medical procedures or tests.  Wear a medical ID bracelet or necklace stating that you have a pacemaker. Carry a pacemaker ID card with you at all times.  Your pacemaker battery will last for 5-15 years. Routine checks by your health care provider will let the health care provider know when the battery is starting to run down. The pacemaker will need to be replaced when the battery starts to run down.  Do not use amateur Chief of Staff. Other electrical devices are safe to use, including power tools, lawn mowers, and speakers. If you are unsure of whether something is safe to use, ask your health care provider.  When using your cell phone, hold it to the ear opposite the pacemaker. Do not leave your cell phone in a pocket over the pacemaker.  Avoid places or objects that have a strong electric or magnetic field, including: ? Airport Herbalist. When at the airport, let officials know that you have a pacemaker. ? Power plants. ? Large electrical generators. ? Radiofrequency transmission towers, such as cell phone and radio towers. General instructions  Weigh yourself every day. If you suddenly gain weight, fluid may be building up in your body.  Keep all follow-up visits as told by your health care provider. This is important. Contact a health care provider if:  You gain weight suddenly.  Your legs or  feet swell.  It feels like your heart is fluttering or skipping beats (heart palpitations).  You have chills or a fever.  You have more redness, swelling, or pain around your incisions.  You have more fluid or blood coming from your incisions.  Your incisions feel warm to the touch.  You have pus or a bad smell coming from your incisions. Get help right away if:  You have chest pain.  You have trouble breathing or are short of breath.  You become  extremely tired.  You are light-headed or you faint. This information is not intended to replace advice given to you by your health care provider. Make sure you discuss any questions you have with your health care provider. Document Released: 10/05/2004 Document Revised: 12/29/2015 Document Reviewed: 12/29/2015 Elsevier Interactive Patient Education  2018 [place] Discharge Instructions for  Pacemaker/Defibrillator Patients  ACTIVITY No heavy lifting or vigorous activity with your left/right arm for 6 to 8 weeks.  Do not raise your left/right arm above your head for one week.  Gradually raise your affected arm as drawn below.           __  NO DRIVING for     ; you may begin driving on     .  WOUND CARE - Keep the wound area clean and dry.  Do not get this area wet for one week. No showers for one week; you may shower on     . - The tape/steri-strips on your wound will fall off; do not pull them off.  No bandage is needed on the site.  DO  NOT apply any creams, oils, or ointments to the wound area. - If you notice any drainage or discharge from the wound, any swelling or bruising at the site, or you develop a fever > 101? F after you are discharged home, call the office at once.  SPECIAL INSTRUCTIONS - You are still able to use cellular telephones; use the ear opposite the side where you have your pacemaker/defibrillator.  Avoid carrying your cellular phone near your device. - When traveling through airports, show security personnel your identification card to avoid being screened in the metal detectors.  Ask the security personnel to use the hand wand. - Avoid arc welding equipment, MRI testing (magnetic resonance imaging), TENS units (transcutaneous nerve stimulators).  Call the office for questions about other devices. - Avoid electrical appliances that are in poor condition or are not properly grounded. - Microwave ovens are safe to be near or to  operate.  ADDITIONAL INFORMATION FOR DEFIBRILLATOR PATIENTS SHOULD YOUR DEVICE GO OFF: - If your device goes off ONCE and you feel fine afterward, notify the device clinic nurses. - If your device goes off ONCE and you do not feel well afterward, call 911. - If your device goes off TWICE, call 911. - If your device goes off [place], call 911.  DO NOT DRIVE YOURSELF OR A FAMILY MEMBER WITH A DEFIBRILLATOR TO THE HOSPITAL--CALL 911.    [place] - Preparing For Surgery  Before surgery, you can play an important role. Because skin is not sterile, your skin needs to be as free of germs as possible. You can reduce the number of germs on your skin by washing with CHG (chlorahexidine gluconate) Soap before surgery.  CHG is an antiseptic cleaner which kills germs and bonds with the skin to continue killing germs even after washing.   Please do  not use if you have an allergy to CHG or antibacterial soaps.  If your skin becomes reddened/irritated stop using the CHG.   Do not shave (including legs and underarms) for at least 48 hours prior to first CHG shower.  It is OK to shave your face.  Please follow these instructions carefully:  1.  Shower the night before surgery and the morning of surgery with CHG.  2.  If you choose to wash your hair, wash your hair first as usual with your normal shampoo.  3.  After you shampoo, rinse your hair and body thoroughly to remove the shampoo.  4.  Use CHG as you would any other liquid soap.  You can apply CHG directly to the skin and wash gently with a clean washcloth. 5.  Apply the CHG Soap to your body ONLY FROM THE NECK DOWN.  Do not use on open wounds or open sores.  Avoid contact with your eyes, ears, mouth and genitals (private parts).  Wash genitals (private parts) with your normal soap.  6.  Wash thoroughly, paying special attention to the area where your surgery will be performed.  7.  Thoroughly rise your body with warm water from the  neck down.   8.  DO NOT shower/wash with your normal soap after using and rinsing off the CHG soap.  9.  Pat yourself dry with a clean towel.           10.  Wear clean pajamas.           11.  Place clean sheets on your bed the night of your first shower and do not sleep with pets.  Day of Surgery: Do not apply any deodorants/lotions.  Please wear clean clothes to the hospital/surgery center.

## 2020-02-22 ENCOUNTER — Telehealth: Payer: Self-pay | Admitting: Cardiovascular Disease

## 2020-02-22 NOTE — Telephone Encounter (Signed)
Patient is calling requesting to speak with Lattie Haw in regards to a question about her upcoming procedure. Please advise.

## 2020-02-22 NOTE — Telephone Encounter (Signed)
The patient has questions about food restrictions prior to the procedure. All questions have been answered.

## 2020-02-25 ENCOUNTER — Other Ambulatory Visit (HOSPITAL_COMMUNITY)
Admission: RE | Admit: 2020-02-25 | Discharge: 2020-02-25 | Disposition: A | Discharge status: Home / Self Care | Payer: Medicare PPO | Source: Ambulatory Visit | Attending: Cardiovascular Disease | Admitting: Cardiovascular Disease

## 2020-02-25 DIAGNOSIS — Z01812 Encounter for preprocedural laboratory examination: Secondary | ICD-10-CM | POA: Insufficient documentation

## 2020-02-25 DIAGNOSIS — Z20822 Contact with and (suspected) exposure to covid-19: Secondary | ICD-10-CM | POA: Insufficient documentation

## 2020-02-25 LAB — SARS CORONAVIRUS 2 (TAT 6-24 HRS): SARS Coronavirus 2: NEGATIVE

## 2020-02-27 ENCOUNTER — Other Ambulatory Visit: Payer: Self-pay | Admitting: *Deleted

## 2020-02-27 DIAGNOSIS — I495 Sick sinus syndrome: Secondary | ICD-10-CM

## 2020-02-27 MED ORDER — SODIUM CHLORIDE 0.9% FLUSH: 3.0000 mL | Freq: Two times a day (BID) | INTRAVENOUS | Status: DC

## 2020-02-28 ENCOUNTER — Ambulatory Visit (HOSPITAL_COMMUNITY)
Admission: RE | Admit: 2020-02-28 | Discharge: 2020-02-28 | Disposition: A | Discharge status: Home / Self Care | Payer: Medicare PPO | Attending: Cardiovascular Disease | Admitting: Cardiovascular Disease

## 2020-02-28 ENCOUNTER — Other Ambulatory Visit: Payer: Self-pay

## 2020-02-28 ENCOUNTER — Ambulatory Visit (HOSPITAL_COMMUNITY): Payer: Medicare PPO

## 2020-02-28 ENCOUNTER — Ambulatory Visit (HOSPITAL_COMMUNITY)
Admission: RE | Disposition: A | Discharge status: Home / Self Care | Payer: Self-pay | Source: Home / Self Care | Attending: Cardiovascular Disease

## 2020-02-28 DIAGNOSIS — E785 Hyperlipidemia, unspecified: Secondary | ICD-10-CM | POA: Insufficient documentation

## 2020-02-28 DIAGNOSIS — E7800 Pure hypercholesterolemia, unspecified: Secondary | ICD-10-CM | POA: Diagnosis not present

## 2020-02-28 DIAGNOSIS — R001 Bradycardia, unspecified: Secondary | ICD-10-CM

## 2020-02-28 DIAGNOSIS — I495 Sick sinus syndrome: Secondary | ICD-10-CM | POA: Diagnosis not present

## 2020-02-28 DIAGNOSIS — Z7901 Long term (current) use of anticoagulants: Secondary | ICD-10-CM | POA: Insufficient documentation

## 2020-02-28 DIAGNOSIS — I480 Paroxysmal atrial fibrillation: Secondary | ICD-10-CM | POA: Diagnosis not present

## 2020-02-28 DIAGNOSIS — I10 Essential (primary) hypertension: Secondary | ICD-10-CM | POA: Insufficient documentation

## 2020-02-28 DIAGNOSIS — Z79899 Other long term (current) drug therapy: Secondary | ICD-10-CM | POA: Insufficient documentation

## 2020-02-28 DIAGNOSIS — I517 Cardiomegaly: Secondary | ICD-10-CM | POA: Diagnosis not present

## 2020-02-28 DIAGNOSIS — Z950 Presence of cardiac pacemaker: Secondary | ICD-10-CM

## 2020-02-28 DIAGNOSIS — I421 Obstructive hypertrophic cardiomyopathy: Secondary | ICD-10-CM | POA: Insufficient documentation

## 2020-02-28 HISTORY — PX: PACEMAKER IMPLANT: EP1218

## 2020-02-28 SURGERY — PACEMAKER IMPLANT

## 2020-02-28 MED ORDER — SODIUM CHLORIDE 0.9% FLUSH: 3.0000 mL | INTRAVENOUS | Status: DC | PRN

## 2020-02-28 MED ORDER — MIDAZOLAM HCL 5 MG/5ML IJ SOLN
INTRAMUSCULAR | Status: DC | PRN
  Administered 2020-02-28 (×2): 1 mg via INTRAVENOUS

## 2020-02-28 MED ORDER — SODIUM CHLORIDE 0.9 % IV SOLN
INTRAVENOUS | Status: AC
  Filled 2020-02-28: qty 2

## 2020-02-28 MED ORDER — SODIUM CHLORIDE 0.9 % IV SOLN
80.0000 mg | INTRAVENOUS | Status: AC
  Administered 2020-02-28: 80 mg

## 2020-02-28 MED ORDER — LIDOCAINE HCL 1 % IJ SOLN
INTRAMUSCULAR | Status: AC
  Filled 2020-02-28: qty 40

## 2020-02-28 MED ORDER — SODIUM CHLORIDE 0.9 % IV SOLN: 250.0000 mL | INTRAVENOUS | Status: DC

## 2020-02-28 MED ORDER — HEPARIN (PORCINE) IN NACL 1000-0.9 UT/500ML-% IV SOLN
INTRAVENOUS | Status: AC
  Filled 2020-02-28: qty 500

## 2020-02-28 MED ORDER — MIDAZOLAM HCL 5 MG/5ML IJ SOLN
INTRAMUSCULAR | Status: AC
  Filled 2020-02-28: qty 5

## 2020-02-28 MED ORDER — CHLORHEXIDINE GLUCONATE 4 % EX LIQD: 4.0000 "application " | Freq: Once | CUTANEOUS | Status: DC

## 2020-02-28 MED ORDER — FENTANYL CITRATE (PF) 100 MCG/2ML IJ SOLN
INTRAMUSCULAR | Status: AC
  Filled 2020-02-28: qty 2

## 2020-02-28 MED ORDER — ONDANSETRON HCL 4 MG/2ML IJ SOLN: 4.0000 mg | Freq: Four times a day (QID) | INTRAMUSCULAR | Status: DC | PRN

## 2020-02-28 MED ORDER — SODIUM CHLORIDE 0.9 % IV SOLN: INTRAVENOUS | Status: DC

## 2020-02-28 MED ORDER — BISOPROLOL FUMARATE 5 MG PO TABS
5.0000 mg | ORAL_TABLET | Freq: Every day | ORAL | 11 refills | Status: DC
Start: 2020-02-28 — End: 2020-03-06

## 2020-02-28 MED ORDER — HEPARIN (PORCINE) IN NACL 1000-0.9 UT/500ML-% IV SOLN
INTRAVENOUS | Status: DC | PRN
  Administered 2020-02-28: 500 mL

## 2020-02-28 MED ORDER — VANCOMYCIN HCL IN DEXTROSE 1-5 GM/200ML-% IV SOLN
1000.0000 mg | INTRAVENOUS | Status: AC
  Administered 2020-02-28: 1000 mg via INTRAVENOUS

## 2020-02-28 MED ORDER — VANCOMYCIN HCL IN DEXTROSE 1-5 GM/200ML-% IV SOLN
INTRAVENOUS | Status: AC
  Filled 2020-02-28: qty 200

## 2020-02-28 MED ORDER — FENTANYL CITRATE (PF) 100 MCG/2ML IJ SOLN
INTRAMUSCULAR | Status: DC | PRN
  Administered 2020-02-28 (×2): 25 ug via INTRAVENOUS

## 2020-02-28 MED ORDER — ACETAMINOPHEN 325 MG PO TABS: 325.0000 mg | ORAL_TABLET | ORAL | Status: DC | PRN

## 2020-02-28 MED ORDER — IOHEXOL 350 MG/ML SOLN
INTRAVENOUS | Status: DC | PRN
  Administered 2020-02-28: 10 mL

## 2020-02-28 MED ORDER — SODIUM CHLORIDE 0.9 % IV SOLN: 250.0000 mL | INTRAVENOUS | Status: DC | PRN

## 2020-02-28 MED ORDER — APIXABAN 5 MG PO TABS
5.0000 mg | ORAL_TABLET | Freq: Two times a day (BID) | ORAL | 6 refills | Status: DC
Start: 2020-02-28 — End: 2020-07-04

## 2020-02-28 SURGICAL SUPPLY — 8 items
CABLE SURGICAL S-101-97-12 (CABLE) ×3 IMPLANT
LEAD INGEVITY 7840 45 (Lead) ×3 IMPLANT
LEAD INGEVITY 7841 52 (Lead) ×3 IMPLANT
PACEMAKER ACCOLADE DR-EL (Pacemaker) ×3 IMPLANT
PAD PRO RADIOLUCENT 2001M-C (PAD) ×3 IMPLANT
SHEATH 7FR PRELUDE SNAP 13 (SHEATH) ×6 IMPLANT
TRAY PACEMAKER INSERTION (PACKS) ×3 IMPLANT
WIRE HI TORQ VERSACORE-J 145CM (WIRE) ×3 IMPLANT

## 2020-02-28 NOTE — Discharge Instructions (Signed)

## 2020-02-28 NOTE — Op Note (Signed)
Procedure report  Procedure performed:  1. Implantation of new dual chamber permanent pacemaker 2. Fluoroscopy and LUE venogram 3. 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  The patient's heart rate, blood pressure, and oxygen saturation are monitored continuously during the procedure. The period of conscious sedation is 66 minutes, of which I was present face-to-face 100% of this time.  Sanda Klein, MD, [facility] [phone] office [phone] pager  Device details: Mahomet model L331 serial number [id number] Right atrial lead Bos Sci Ingevity model 7840 45cm serial number [id number] Right ventricular lead Bos Sci Ingevity model 7841 52cm serial number [id number]  Procedure details:  After the risks and benefits of the procedure were discussed the patient provided informed consent and was brought to the cardiac cath lab in the fasting state. The patient was prepped and draped in usual sterile fashion. Local anesthesia with 1% lidocaine was administered to to the left infraclavicular area. A 5-6 cm horizontal incision was made parallel with and 2-3 cm caudal to the left clavicle. Using electrocautery and blunt dissection a prepectoral pocket was created down to the level of the pectoralis major muscle fascia. The pocket was carefully inspected for hemostasis. An antibiotic-soaked sponge was placed in the pocket.  Under fluoroscopic guidance and using the modified Seldinger  technique 2 separate venipunctures were performed to access the left subclavian vein. Some difficulty was encountered accessing the vein and a venogram was performed.  A single stick was performed and 2 guidewires were placed through a 79F sheath , subsequently exchanged for two 7 French safe sheaths.  Under fluoroscopic guidance the ventricular lead was advanced to level of the apical right ventricular septum and thet active-fixation helix was deployed. Prominent current of injury was seen. Satisfactory pacing and sensing parameters were recorded. There was no evidence of diaphragmatic stimulation at maximum device output. The safe sheath was peeled away and the lead was secured in place with 2-0 silk.  In similar fashion the right atrial lead was advanced to the level of the atrial appendage. The active-fixation helix was deployed. There was prominent current of injury. Satisfactory  pacing and sensing parameters were recorded. There was no evidence of diaphragmatic stimulation with pacing at maximum device output. The safe sheath was peeled away and the lead was secured in place with 2-0 silk.  The antibiotic-soaked sponge was removed from the pocket. The pocket was flushed with copious amounts of antibiotic solution. Reinspection showed excellent hemostasis..  The ventricular lead was connected to the generator and appropriate ventricular pacing was seen. Subsequently the atrial lead was also connected. Repeat testing of the lead parameters via telemetry showed excellent values.  The entire system was then carefully inserted in the pocket with care been taking that the leads and device assumed a comfortable position without pressure on the incision. Great care was taken that the leads be located deep to the generator. The pocket was then closed in layers using 2 layers of 2-0 Vicryl, one layer of 3-0 Vicryl and cutaneous steristrips, after which a sterile dressing was  applied.  At the end of the  procedure the following lead parameters were encountered:  Right atrial lead  sensed P waves 3.6 mV, impedance 594ohms, threshold 0.6 V at 0.4 ms pulse width.  Right ventricular lead sensed R waves 12.1 mV, impedance 822 ohms, threshold 0.5 V at 0.4 ms pulse width.

## 2020-02-28 NOTE — Interval H&P Note (Signed)
History and Physical Interval Note:  02/28/2020 1:20 PM  Alexandria French  has presented today for surgery, with the diagnosis of SSS.  The various methods of treatment have been discussed with the patient and family. After consideration of risks, benefits and other options for treatment, the patient has consented to  Procedure(s): PACEMAKER IMPLANT (N/A) as a surgical intervention.  The patient's history has been reviewed, patient examined, no change in status, stable for surgery.  I have reviewed the patient's chart and labs.  Questions were answered to the patient's satisfaction.     Alexandria French

## 2020-02-29 ENCOUNTER — Encounter (HOSPITAL_COMMUNITY): Payer: Self-pay | Admitting: Cardiovascular Disease

## 2020-02-29 MED FILL — Lidocaine HCl Local Inj 1%: INTRAMUSCULAR | Qty: 60 | Status: AC

## 2020-03-05 ENCOUNTER — Other Ambulatory Visit: Payer: Self-pay | Admitting: Physician Assistant

## 2020-03-05 ENCOUNTER — Telehealth: Payer: Self-pay | Admitting: Physician Assistant

## 2020-03-05 MED ORDER — AMLODIPINE BESYLATE 5 MG PO TABS: 5.0000 mg | ORAL_TABLET | Freq: Every day | ORAL | 3 refills | Status: DC

## 2020-03-05 NOTE — Telephone Encounter (Signed)
   Ms. Stipp called because she developed a rash consistent with an allergic reaction last p.m.  She ate fish last night at a restaurant where she eats on a regular basis.  She has never had a reaction to fish before.  Additionally, her blood pressure is running much higher than it usually does, SBP this a.m. is 178.  She has had already taken her morning medications.  The rash is red, pruritic and is over the top part of her chest and up into her neck.  She took 2 Benadryl tablets last night and was able to get some rest.  Today, it is not quite as itchy, but is still very red and present.  Her incision site for her pacemaker is a little puffy, but no significant swelling.  There had been some redness around it, but this is worse now with the rash.  Advised her that she could use over-the-counter steroid cream and continue taking Benadryl.  If the symptoms are from the fish, they should gradually improve.  If the symptoms are from the device, they will not get better.  I will try to move up her device check which is currently scheduled for 12/14.  If it could also be a provider appointment, that would be better.  I advised that if her symptoms do not improve, she will need to call her PCP or go to an urgent care for more treatment.  Currently, she is agreeable to using over-the-counter medications to manage the symptoms.  Rosaria Ferries, PA-C 03/05/2020 8:14 AM

## 2020-03-06 ENCOUNTER — Other Ambulatory Visit: Payer: Self-pay

## 2020-03-06 ENCOUNTER — Ambulatory Visit: Payer: Medicare PPO | Admitting: Cardiovascular Disease

## 2020-03-06 ENCOUNTER — Encounter: Payer: Self-pay | Admitting: Cardiovascular Disease

## 2020-03-06 VITALS — BP 141/70 | HR 61 | Ht 62.0 in | Wt 136.0 lb

## 2020-03-06 DIAGNOSIS — I495 Sick sinus syndrome: Secondary | ICD-10-CM

## 2020-03-06 DIAGNOSIS — R931 Abnormal findings on diagnostic imaging of heart and coronary circulation: Secondary | ICD-10-CM | POA: Diagnosis not present

## 2020-03-06 DIAGNOSIS — Z7901 Long term (current) use of anticoagulants: Secondary | ICD-10-CM | POA: Diagnosis not present

## 2020-03-06 DIAGNOSIS — L282 Other prurigo: Secondary | ICD-10-CM

## 2020-03-06 DIAGNOSIS — I10 Essential (primary) hypertension: Secondary | ICD-10-CM

## 2020-03-06 DIAGNOSIS — E7800 Pure hypercholesterolemia, unspecified: Secondary | ICD-10-CM | POA: Diagnosis not present

## 2020-03-06 DIAGNOSIS — I480 Paroxysmal atrial fibrillation: Secondary | ICD-10-CM | POA: Diagnosis not present

## 2020-03-06 DIAGNOSIS — I421 Obstructive hypertrophic cardiomyopathy: Secondary | ICD-10-CM

## 2020-03-06 MED ORDER — TRIAMCINOLONE ACETONIDE 0.025 % EX OINT: 1.0000 "application " | TOPICAL_OINTMENT | Freq: Two times a day (BID) | CUTANEOUS | 0 refills | Status: DC

## 2020-03-06 MED ORDER — BISOPROLOL FUMARATE 10 MG PO TABS
10.0000 mg | ORAL_TABLET | Freq: Every day | ORAL | 11 refills | Status: DC
Start: 2020-03-06 — End: 2020-03-27

## 2020-03-06 NOTE — Telephone Encounter (Signed)
   Pt is calling back, she said the itching and redness is spreading now to her neck and arms. She said the cream only helped a little bit. She is getting concerned and would like to know what to do next

## 2020-03-06 NOTE — Telephone Encounter (Signed)
Spoke with pt, she reports the rash, she feels is from her device. She will see Dr croitoru this morning.

## 2020-03-06 NOTE — Progress Notes (Signed)
Cardiology Office Note:    Date:  03/06/2020   ID:  Alexandria French, DOB 10-14-1950, MRN [id number]  PCP:  Rory Percy, MD  [facility] Cardiologist:  Kaipo Ardis [place] Electrophysiologist:  None   Referring MD: Rory Percy, MD   No chief complaint on file.   History of Present Illness:    Alexandria French is a 69 y.o. 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  Feels better on beta-blockers, but the dose was limited by bradycardia.  Underwent pacemaker implantation exactly 1 week ago on the procedure went smoothly but after couple of days she developed a expanding maculopapular intensely pruritic rash.  It seemed to be initially around the pacemaker site, but has now encompassed her entire anterior chest, upper half of her abdomen and her left axillary area.  It does not involve her back.  It responds initially to hydrocortisone cream, which alleviates the rash and the pruritus, but the effects of this are very brief.  She has been taking Benadryl and this has helped with the pruritus and allows her to sleep.    She feels she has increased stamina and less shortness of breath since pacemaker implantation and she has not had any episodes of dizziness or syncope.  Over the weekend, while she was frustrated and agitated because of the pruritic rash, her blood pressure has been high and she was advised to increase her amlodipine dose.  Her blood pressure has improved somewhat.  She wore an event monitor that showed prominent sinus bradycardia and chronotropic incompetence, with a maximum heart rate but only reached 60% of max predicted.  She did have frequent mostly monomorphic PVCs  (6% of all beats).  Also had some brief bursts of nonsustained atrial tachycardia.  There was no complex ventricular arrhythmia.  She has not had full syncope previously or documented VT. There is no family history of HCM, VT, sudden death or CHF.  2 separate echocardiograms performed in August 2020 in May 2021 show findings consistent with hypertrophic obstructive cardiomyopathy, although the degree of left ventricular hypertrophy is relatively mild and appears to be concentric rather than asymmetrical.  Systolic anterior motion of the mitral valve with increased gradients across the left ventricular outflow tract.  These have been calculated at 145 mmHg and 213 mmHg respectively, although I am not sure that the estimation is accurate.  There seems to be a lot of contamination with mitral regurgitant flow on the Doppler signal.  There is however convincing evidence of gradients across the LVOT of at least 50 mmHg at rest.  On both studies the left ventricular systolic function is hyperdynamic with ejection fraction in excess of 75%.  There is no evidence of major abnormalities of the aortic valve itself.  Coronary CT angiogram performed in September 2020 showed a calcium score in the 85th percentile with no major coronary artery stenoses.  The coronary system is left dominant.  There is very mild bilateral carotid artery plaque with stenoses less than 40%.  Atrial fibrillation was reportedly detected on a previous cardiac monitor in June 2019.  The ventricular rate was over 178 bpm.  The episode was associated with dizziness and lightheadedness.  I am not sure it can accurately be categorized as atrial fibrillation since it was  so brief, but she definitely had at least paroxysmal atrial tachycardia.  Although frequent PVCs including ventricular bigeminy were recorded, there was no evidence of ventricular tachycardia..  She has a history of essential hypertension which is currently being managed with  high-dose lisinopril and a low-dose of bisoprolol.  Past Medical History:  Diagnosis Date  . A-fib ([place])   . CAD (coronary artery disease)   . Esophageal reflux    no meds  . Hypertension   . Other and unspecified hyperlipidemia     Past Surgical History:  Procedure Laterality Date  . ABDOMINAL HYSTERECTOMY  08/2008  . BLADDER REPAIR    . PACEMAKER IMPLANT N/A 02/28/2020   Procedure: PACEMAKER IMPLANT;  Surgeon: Sanda Klein, MD;  Location: [facility];  Service: Cardiovascular;  Laterality: N/A;  . TONSILLECTOMY      Current Medications: Current Meds  Medication Sig  . acetaminophen (TYLENOL) 500 MG tablet Take 500-1,000 mg by mouth 2 (two) times daily as needed (back pain.).  Marland Kitchen amLODipine (NORVASC) 5 MG tablet Take 1 tablet (5 mg total) by mouth daily.  Marland Kitchen apixaban (ELIQUIS) 5 MG TABS tablet Take 1 tablet (5 mg total) by mouth 2 (two) times daily. Restart on 02/29/2020 in the evening  . bisoprolol (ZEBETA) 10 MG tablet Take 1 tablet (10 mg total) by mouth daily.  . Calcium Carbonate-Vitamin D (CALTRATE 600+D) 600-400 MG-UNIT per tablet Take 1 tablet by mouth 2 (two) times daily.   . cetirizine (ZYRTEC) 10 MG tablet Take 10 mg by mouth every evening.   . Coenzyme Q10 (COQ-10) 100 MG CAPS Take 100 mg by mouth daily.   . fluticasone (FLONASE) 50 MCG/ACT nasal spray Place 2 sprays into both nostrils daily as needed (allergies.).   Marland Kitchen lisinopril (ZESTRIL) 20 MG tablet Take 1 tablet (20 mg total) by mouth 2 (two) times daily.  Marland Kitchen LORazepam (ATIVAN) 1 MG tablet Take 0.5-1 mg by mouth at bedtime as needed for anxiety or sleep.   . simvastatin (ZOCOR) 20 MG tablet Take 20 mg by mouth daily.  . [DISCONTINUED] bisoprolol (ZEBETA) 5 MG tablet Take 1 tablet (5 mg total) by mouth daily.   Current Facility-Administered Medications for the 03/06/20 encounter (Office Visit) with Sanda Klein, MD  Medication  . sodium chloride flush (NS) 0.9 % injection 3 mL     Allergies:   Cefzil  [cefprozil]   Social History   Socioeconomic History  . Marital status: Married    Spouse name: Not on file  . Number of children: Not on file  . Years of education: Not on file  . Highest education level: Not on file  Occupational History  . Not on file  Tobacco Use  . Smoking status: Never Smoker  . Smokeless tobacco: Never Used  Substance and Sexual Activity  . Alcohol use: No    Alcohol/week: 0.0 standard drinks  . Drug use: No  . Sexual activity: Not on file  Other Topics Concern  . Not on file  Social History Narrative  . Not on file   Social Determinants of Health   Financial Resource Strain:   . Difficulty of Paying Living Expenses: Not on file  Food Insecurity:   . Worried About Charity fundraiser in the Last Year: Not on file  . Ran Out of Food in the Last Year: Not on file  Transportation Needs:   . Lack of Transportation (Medical): Not on file  . Lack of Transportation (Non-Medical): Not on file  Physical Activity:   . Days of Exercise per Week: Not on file  . Minutes of Exercise per Session: Not on file  Stress:   . Feeling of Stress : Not on file  Social Connections:   . Frequency of Communication with Friends and Family: Not on file  . Frequency of Social Gatherings with Friends and Family: Not on file  . Attends Religious Services: Not on file  . Active Member of Clubs or Organizations: Not on file  . Attends Archivist Meetings: Not on file  . Marital Status: Not on file     Family History: The patient's family history includes Arthritis in an other family member; Heart disease in an other family member; Osteoporosis in an other family member. There is no history of Colon cancer or Colon polyps.  Father lived to 76 years old, has an older sister 59 years old who is healthy, had another sister died at age 65 of a lung infection.  1 child (son), healthy.  ROS:   Please see the history of present illness. All other systems are reviewed and  are negative.    EKGs/Labs/Other Studies Reviewed:    The following studies were reviewed today: Echocardiogram 08/27/2019  1. LVOT peak gradient 213 mmHg. Left ventricular ejection fraction, by estimation, is 70 to 75%. The left ventricle has hyperdynamic function. The left ventricle has no regional wall motion abnormalities. There is mild concentric left ventricular hypertrophy. Left ventricular diastolic parameters are consistent with Grade I diastolic dysfunction (impaired relaxation). Elevated left atrial pressure. 2. Right ventricular systolic function is normal. The right ventricular size is normal. There is normal pulmonary artery systolic pressure. 3. The mitral valve is grossly normal. Trivial mitral valve regurgitation. 4. The aortic valve is tricuspid. Aortic valve regurgitation is mild. No aortic stenosis is present. 5. The inferior vena cava is normal in size with greater than 50% respiratory variability, suggesting right atrial pressure of 3 mmHg.   Echocardiogram 11/26/2018: 1. The left ventricle has a visually estimated ejection fraction of 75%.  The cavity size was normal. Left ventricular diastolic Doppler parameters  are consistent with impaired relaxation. Elevated left ventricular  end-diastolic pressure No evidence of  left ventricular regional wall motion abnormalities.  2. The right ventricle has normal systolic function. The cavity was  normal. There is no increase in right ventricular wall thickness.  3. The aortic valve is tricuspid. Mild thickening of the aortic valve.  Aortic valve regurgitation is mild by color flow Doppler. Mild aortic  annular calcification noted.  4. The mitral valve is abnormal. Moderate thickening of the mitral valve  leaflet. Mitral valve regurgitation is moderate by color flow Doppler.  5. Mitral chordal SAM is noted. LVOT is small. Peak gradient 145 mmHg.  6. The tricuspid valve is grossly normal. Tricuspid valve  regurgitation  is moderate.  7. The aorta is normal unless otherwise noted.   Coronary CT angiography 12/03/2018: FINDINGS: Coronary calcium score: The patient's coronary artery calcium score is 149, which places the patient in the 85th percentile. Coronary arteries: Normal coronary origins. Left dominance Right Coronary Artery: Small, non-dominant vessel which gives off a larger AV nodal branch. No obstructive disease. Left Main Coronary Artery: Prominent left coronary cusp, but not anuerymsal. No stenosis. Gives off larger LAD and LCx arteries. Left Anterior Descending Coronary Artery: Calcified proximal vessel with mild 25-49% (CADRADS2) mixed stenosis. Gives off several small diagonal branches. Ramus Intermedius Artery: Small branch without significant stenosis.  Left Circumflex Artery:  Dominant vessel that gives off a small PDA artery. Larger caliber with minimal mixed 1-24% mid-vessel stenosis (CADRADS1).  Aorta: Normal size, 28 mm at the mid ascending aorta (level of the PA bifurcation) measured double oblique. Atherosclerosis. No dissection. Aortic Valve: Trileaflet. Calcification of the annulus.  Other findings: Thickening of the aortic and mitral valve leaflets. Normal biatrial size Normal pulmonary vein drainage into the left atrium. Normal left atrial appendage without a thrombus. Normal size of the pulmonary artery.  IMPRESSION: 1. Mild, non-obstructive CAD primarily of the LAD, CADRADS = 2. 2. Coronary calcium score of 149. This was 85th percentile for age and sex matched control. 3. Normal coronary origin with left dominance. 4. Thickening of the aortic and mitral valve leaflets  MCOT monitor 07/14/2017  Sinus rhythm with frequent PVCs. There was one episode of rapid atrial fibrillation.   EKG:  EKG is ordered today and shows sinus bradycardia, left atrial abnormality and left bundle branch block. Recent Labs: 02/16/2020: BUN 9; Creatinine, Ser 0.80;  Hemoglobin 11.3; Platelets 213; Potassium 4.3; Sodium 142  10/07/2019 Creatinine 0.86, glucose 92, potassium 4.0, normal liver function test Hemoglobin 8.7 with low MCV TSH 3.600 Recent Lipid Panel No results found for: CHOL, TRIG, HDL, CHOLHDL, VLDL, LDLCALC, LDLDIRECT 10/07/2019 Total cholesterol 171, HDL 56, LDL 86, triglycerides 145 Physical Exam:    VS:  BP (!) 141/70   Pulse 61   Ht 5\' 2"  (1.575 m)   Wt 136 lb (61.7 kg)   SpO2 98%   BMI 24.87 kg/m     Wt Readings from Last 3 Encounters:  03/06/20 136 lb (61.7 kg)  02/28/20 140 lb (63.5 kg)  02/16/20 136 lb (61.7 kg)      General: Alert, oriented x3, no distress, she has a widespread maculopapular erythematous rash over her entire anterior chest, upper half of her anterior abdomen, left axillary area.  The pacemaker pocket is not swollen or tender.  There is no discharge.  The Steri-Strips are still in place.  The rash does appear to be somewhat more intense in the left subclavian area surrounding the pacemaker site Head: no evidence of trauma, PERRL, EOMI, no exophtalmos or lid lag, no myxedema, no xanthelasma; normal ears, nose and oropharynx Neck: normal jugular venous pulsations and no hepatojugular reflux; brisk carotid pulses without delay and no carotid bruits Chest: clear to auscultation, no signs of consolidation by percussion or palpation, normal fremitus, symmetrical and full respiratory excursions Cardiovascular: normal position and quality of the apical impulse, regular rhythm, normal first and second heart sounds, no murmurs, rubs or gallops Abdomen: no tenderness or distention, no masses by palpation, no abnormal pulsatility or arterial bruits, normal bowel sounds, no hepatosplenomegaly Extremities: no clubbing, cyanosis or edema; 2+ radial, ulnar and brachial pulses bilaterally; 2+ right femoral, posterior tibial and dorsalis pedis pulses; 2+ left femoral, posterior tibial and dorsalis pedis pulses; no subclavian or  femoral bruits Neurological: grossly nonfocal Psych: Normal mood and affect  ASSESSMENT:    1. HOCM (hypertrophic obstructive cardiomyopathy) ([place])   2. SSS (sick sinus syndrome) ([place])   3. Pruritic rash   4. Essential hypertension   5. Paroxysmal atrial fibrillation (H[facility])   6. Long term (current) use of anticoagulants   7. Elevated coronary artery calcium score   8. Hypercholesterolemia    PLAN:    In order of problems listed above:  1. HOCM: Both her physical exam and her echocardiograms are highly suggestive of hypertrophic obstructive cardiomyopathy.  Try to avoid diuretics and  potent vasodilators.  Increased her beta-blocker dose after the pacemaker was implanted. 2. SSS: Symptoms have improved following pacemaker implantation.  I am not sure if this is because of the faster heart rate itself or because of the ability to prescribe her more beta-blockers. 3. Rash: Thankfully, I do not think this represents pacemaker device site infection.  It appears to be consistent with contact dermatitis and roughly its distribution corresponds to the area that would have been prepped with chlorhexidine before her surgery and/or the [place] on the surgical drape.  We will prescribe her a more potent topical steroid temporarily, but advised her to keep this well away from the actual surgical site by at least an inch.  Okay to continue taking antihistamines for the pruritus.  If the diagnosis of allergic contact dermatitis is correct, I would expect her symptoms to begin to improve gradually over the next week. 4. HTN: Her blood pressure still not ideally controlled.  We will increase the dose of beta-blocker.  Ideally will get her off amlodipine in the long run (this may worsen the inducible LV outflow tract gradient and has a an adverse interaction with her simvastatin). 5. AFib: I am not confident that she ever had paroxysmal atrial fibrillation, but she definitely has episodes of paroxysmal atrial  tachycardia and due to her underlying structural heart disease is at high risk for developing atrial fibrillation.  So far she is tolerating anticoagulation well and at least clinically her anemia seems to be improving.  We will use her pacemaker for rhythm monitoring if she does not have atrial fibrillation for a long period of time (several months to a year), we can consider discontinuing her anticoagulation. 6. Anticoagulation: Most recent hemoglobin level was 12.4 on December 07, 2019 0.3 on November 17 just before pacemaker implantation.  The erythrocyte indices are normocytic normochromic..  At her request offered contact information for [facility].  She would like to consider going ahead with a complete colonoscopy.  Continue iron supplements.   7. Elevated coronary calcium score: He does not have angina pectoris at rest with activity and had a low risk nuclear stress test.  The focus is on risk factor modification.  She is not on aspirin while she takes full anticoagulation.  On statin. 8. HLP: Target LDL cholesterol should ideally be less than 70.  Was 78 on most recent assessment, improving. On statin.  Note interaction between amlodipine and simvastatin, but my desire would be to discontinue her amlodipine eventually.   Medication Adjustments/Labs and Tests Ordered: Current medicines are reviewed at length with the patient today.  Concerns regarding medicines are outlined above.  No orders of the defined types were placed in this encounter.  Meds ordered this encounter  Medications  . bisoprolol (ZEBETA) 10 MG tablet    Sig: Take 1 tablet (10 mg total) by mouth daily.    Dispense:  30 tablet    Refill:  11  . triamcinolone (KENALOG) 0.025 % ointment    Sig: Apply 1 application topically 2 (two) times daily.    Dispense:  30 g    Refill:  0    Patient Instructions  Medication Instructions:  INCREASE the Bisoprolol to 10 mg once Triamcinolone cream: use one inch away from the site  up to 2 times daily.   *If you need a refill on your cardiac medications before your next appointment, please call your pharmacy*   Lab Work: None ordered If you have labs (blood work) drawn today  and your tests are completely normal, you will receive your results only by: Marland Kitchen MyChart Message (if you have MyChart) OR . A paper copy in the mail If you have any lab test that is abnormal or we need to change your treatment, we will call you to review the results.   Testing/Procedures: None ordered   Follow-Up: At [facility], you and your health needs are our priority.  As part of our continuing mission to provide you with exceptional heart care, we have created designated Provider Care Teams.  These Care Teams include your primary Cardiologist (physician) and Advanced Practice Providers (APPs -  Physician Assistants and Nurse Practitioners) who all work together to provide you with the care you need, when you need it.  We recommend signing up for the patient portal called "MyChart".  Sign up information is provided on this After Visit Summary.  MyChart is used to connect with patients for Virtual Visits (Telemedicine).  Patients are able to view lab/test results, encounter notes, upcoming appointments, etc.  Non-urgent messages can be sent to your provider as well.   To learn more about what you can do with MyChart, go to [email or web address].    Your next appointment:   Keep your follow up as scheduled.     Signed, Sanda Klein, MD  03/06/2020 6:32 PM    [place]

## 2020-03-06 NOTE — Patient Instructions (Signed)
Medication Instructions:  INCREASE the Bisoprolol to 10 mg once Triamcinolone cream: use one inch away from the site up to 2 times daily.   *If you need a refill on your cardiac medications before your next appointment, please call your pharmacy*   Lab Work: None ordered If you have labs (blood work) drawn today and your tests are completely normal, you will receive your results only by: Marland Kitchen MyChart Message (if you have MyChart) OR . A paper copy in the mail If you have any lab test that is abnormal or we need to change your treatment, we will call you to review the results.   Testing/Procedures: None ordered   Follow-Up: At [facility], you and your health needs are our priority.  As part of our continuing mission to provide you with exceptional heart care, we have created designated Provider Care Teams.  These Care Teams include your primary Cardiologist (physician) and Advanced Practice Providers (APPs -  Physician Assistants and Nurse Practitioners) who all work together to provide you with the care you need, when you need it.  We recommend signing up for the patient portal called "MyChart".  Sign up information is provided on this After Visit Summary.  MyChart is used to connect with patients for Virtual Visits (Telemedicine).  Patients are able to view lab/test results, encounter notes, upcoming appointments, etc.  Non-urgent messages can be sent to your provider as well.   To learn more about what you can do with MyChart, go to [email or web address].    Your next appointment:   Keep your follow up as scheduled.

## 2020-03-14 ENCOUNTER — Other Ambulatory Visit: Payer: Self-pay

## 2020-03-14 ENCOUNTER — Ambulatory Visit (INDEPENDENT_AMBULATORY_CARE_PROVIDER_SITE_OTHER): Payer: Medicare PPO | Admitting: Emergency Medicine

## 2020-03-14 DIAGNOSIS — I495 Sick sinus syndrome: Secondary | ICD-10-CM

## 2020-03-14 LAB — CUP PACEART INCLINIC DEVICE CHECK
Battery Remaining Longevity: 162 mo
Brady Statistic RA Percent Paced: 97 %
Brady Statistic RV Percent Paced: 1 %
Date Time Interrogation Session: 20211214151323
Implantable Lead Implant Date: 20211129
Implantable Lead Implant Date: 20211129
Implantable Lead Location: 753859
Implantable Lead Location: 753860
Implantable Lead Model: 7840
Implantable Lead Model: 7841
Implantable Lead Serial Number: 1036898
Implantable Lead Serial Number: 1100104
Implantable Pulse Generator Implant Date: 20211129
Lead Channel Pacing Threshold Amplitude: 0.6 V
Lead Channel Pacing Threshold Amplitude: 1 V
Lead Channel Pacing Threshold Pulse Width: 0.4 ms
Lead Channel Pacing Threshold Pulse Width: 0.4 ms
Lead Channel Sensing Intrinsic Amplitude: 13.2 mV
Lead Channel Sensing Intrinsic Amplitude: 4.9 mV
Pulse Gen Serial Number: 951099

## 2020-03-14 NOTE — Progress Notes (Signed)
Wound check appointment. Steri-strips removed. Wound without redness or edema. Incision edges approximated, wound well healed. Normal device function. Thresholds, sensing, and impedances consistent with implant measurements. Device programmed at 3.5V/auto capture programmed on for extra safety margin until 3 month visit. Histogram distribution appropriate for patient and level of activity. No mode switches or high ventricular rates noted. Patient educated about wound care, arm mobility, lifting restrictions. ROV with Dr Sallyanne Kuster on 06/12/20. Waiting on remote monitor delivery from [facility], next remote 05/30/19.

## 2020-03-27 ENCOUNTER — Other Ambulatory Visit: Payer: Self-pay

## 2020-03-27 ENCOUNTER — Ambulatory Visit (INDEPENDENT_AMBULATORY_CARE_PROVIDER_SITE_OTHER): Payer: Medicare PPO | Admitting: Cardiovascular Disease

## 2020-03-27 ENCOUNTER — Encounter: Payer: Self-pay | Admitting: Cardiovascular Disease

## 2020-03-27 VITALS — BP 159/67 | HR 61 | Ht 62.0 in | Wt 139.2 lb

## 2020-03-27 DIAGNOSIS — Z950 Presence of cardiac pacemaker: Secondary | ICD-10-CM | POA: Diagnosis not present

## 2020-03-27 DIAGNOSIS — I421 Obstructive hypertrophic cardiomyopathy: Secondary | ICD-10-CM | POA: Diagnosis not present

## 2020-03-27 DIAGNOSIS — I495 Sick sinus syndrome: Secondary | ICD-10-CM | POA: Diagnosis not present

## 2020-03-27 DIAGNOSIS — I480 Paroxysmal atrial fibrillation: Secondary | ICD-10-CM | POA: Diagnosis not present

## 2020-03-27 DIAGNOSIS — R931 Abnormal findings on diagnostic imaging of heart and coronary circulation: Secondary | ICD-10-CM | POA: Diagnosis not present

## 2020-03-27 DIAGNOSIS — L282 Other prurigo: Secondary | ICD-10-CM

## 2020-03-27 DIAGNOSIS — E7800 Pure hypercholesterolemia, unspecified: Secondary | ICD-10-CM

## 2020-03-27 DIAGNOSIS — I10 Essential (primary) hypertension: Secondary | ICD-10-CM | POA: Diagnosis not present

## 2020-03-27 DIAGNOSIS — Z7901 Long term (current) use of anticoagulants: Secondary | ICD-10-CM

## 2020-03-27 MED ORDER — BISOPROLOL FUMARATE 10 MG PO TABS
10.0000 mg | ORAL_TABLET | Freq: Two times a day (BID) | ORAL | 11 refills | Status: DC
Start: 2020-03-27 — End: 2020-06-12

## 2020-03-27 NOTE — Patient Instructions (Addendum)
Medication Instructions:  STOP the Amlodipine INCREASE the Bisoprolol 10 mg twice daily  *If you need a refill on your cardiac medications before your next appointment, please call your pharmacy*   Lab Work: None ordered If you have labs (blood work) drawn today and your tests are completely normal, you will receive your results only by: Marland Kitchen MyChart Message (if you have MyChart) OR . A paper copy in the mail If you have any lab test that is abnormal or we need to change your treatment, we will call you to review the results.   Testing/Procedures: None ordered   Follow-Up: At [facility], you and your health needs are our priority.  As part of our continuing mission to provide you with exceptional heart care, we have created designated Provider Care Teams.  These Care Teams include your primary Cardiologist (physician) and Advanced Practice Providers (APPs -  Physician Assistants and Nurse Practitioners) who all work together to provide you with the care you need, when you need it.  We recommend signing up for the patient portal called "MyChart".  Sign up information is provided on this After Visit Summary.  MyChart is used to connect with patients for Virtual Visits (Telemedicine).  Patients are able to view lab/test results, encounter notes, upcoming appointments, etc.  Non-urgent messages can be sent to your provider as well.   To learn more about what you can do with MyChart, go to [email or web address].    Your next appointment:   Keep your follow up with Dr. Royann Shivers on March 14 at 9:40 am

## 2020-03-28 ENCOUNTER — Encounter: Payer: Self-pay | Admitting: Cardiovascular Disease

## 2020-03-28 NOTE — Progress Notes (Signed)
Cardiology Office Note:    Date:  03/28/2020   ID:  Alexandria French, DOB 12/18/1950, MRN [id number]  PCP:  Rory Percy, MD  [facility] Cardiologist:  Cylee Dattilo [place] Electrophysiologist:  None   Referring MD: Rory Percy, MD   No chief complaint on file.   History of Present Illness:    Alexandria French is a 69 y.o. 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  Feels better on beta-blockers, but the dose was limited by bradycardia.  Underwent pacemaker implantation in late November 2021.  The rash over her anterior chest that developed following pacemaker implantation is starting to improve.  It is no longer red, but a more dusky color and is over a smaller area.  Pruritus has diminished.  The pacemaker pocket itself appears to be well-healed and there is no evidence of warmth, tenderness or swelling.  She feels that she has more energy and is less breathless on most days since pacemaker implantation, although not all days are as good.  Her blood pressure is unusually high today, but has typically been in the 130s at home.  On physical exam today her murmur is not audible at rest, although it can still be elicited with the Valsalva maneuver.  ECG shows atrial paced, ventricular sensed rhythm with left bundle branch block.  She wore an event monitor that showed prominent sinus bradycardia and chronotropic incompetence, with a maximum heart rate but only reached 60% of max predicted.  She did have frequent mostly monomorphic PVCs (6% of all beats).  Also had some brief bursts of  nonsustained atrial tachycardia.  There was no complex ventricular arrhythmia. She has not had full syncope previously or documented VT. There is no family history of HCM, VT, sudden death or CHF.  2 separate echocardiograms performed in August 2020 in May 2021 show findings consistent with hypertrophic obstructive cardiomyopathy, although the degree of left ventricular hypertrophy is relatively mild and appears to be concentric rather than asymmetrical.  Systolic anterior motion of the mitral valve with increased gradients across the left ventricular outflow tract.  These have been calculated at 145 mmHg and 213 mmHg respectively, although I am not sure that the estimation is accurate.  There seems to be a lot of contamination with mitral regurgitant flow on the Doppler signal.  There is however convincing evidence of gradients across the LVOT of at least 50 mmHg at rest.  On both studies the left ventricular systolic function is hyperdynamic with ejection fraction in excess of 75%.  There is no evidence of major abnormalities of the aortic valve itself.  Coronary CT angiogram performed in September 2020 showed a calcium score in the 85th percentile with no major coronary artery stenoses.  The coronary system is left dominant.  There is very mild bilateral carotid artery plaque with stenoses less than 40%.  Atrial fibrillation was reportedly detected on a previous cardiac monitor in June 2019.  The ventricular rate was over 178 bpm.  The episode was associated with dizziness and lightheadedness.  I am not sure it can accurately be categorized as atrial fibrillation since it was so brief, but she definitely had at least paroxysmal atrial tachycardia.  Although frequent PVCs  including ventricular bigeminy were recorded, there was no evidence of ventricular tachycardia.  She has a history of essential hypertension that has been mostly managed with lisinopril over the years.  Past Medical History:  Diagnosis  Date  . A-fib ([place])   . CAD (coronary artery disease)   . Esophageal reflux    no meds  . Hypertension   . Other and unspecified hyperlipidemia     Past Surgical History:  Procedure Laterality Date  . ABDOMINAL HYSTERECTOMY  08/2008  . BLADDER REPAIR    . PACEMAKER IMPLANT N/A 02/28/2020   Procedure: PACEMAKER IMPLANT;  Surgeon: Sanda Klein, MD;  Location: [facility];  Service: Cardiovascular;  Laterality: N/A;  . TONSILLECTOMY      Current Medications: Current Meds  Medication Sig  . acetaminophen (TYLENOL) 500 MG tablet Take 500-1,000 mg by mouth 2 (two) times daily as needed (back pain.).  Marland Kitchen apixaban (ELIQUIS) 5 MG TABS tablet Take 1 tablet (5 mg total) by mouth 2 (two) times daily. Restart on 02/29/2020 in the evening  . Calcium Carbonate-Vitamin D 600-400 MG-UNIT tablet Take 1 tablet by mouth 2 (two) times daily.   . cetirizine (ZYRTEC) 10 MG tablet Take 10 mg by mouth every evening.   . Coenzyme Q10 (COQ-10) 100 MG CAPS Take 100 mg by mouth daily.   . fluticasone (FLONASE) 50 MCG/ACT nasal spray Place 2 sprays into both nostrils daily as needed (allergies.).   Marland Kitchen lisinopril (ZESTRIL) 20 MG tablet Take 1 tablet (20 mg total) by mouth 2 (two) times daily.  Marland Kitchen LORazepam (ATIVAN) 1 MG tablet Take 0.5-1 mg by mouth at bedtime as needed for anxiety or sleep.   . simvastatin (ZOCOR) 20 MG tablet Take 20 mg by mouth daily.  Marland Kitchen triamcinolone (KENALOG) 0.025 % ointment Apply 1 application topically 2 (two) times daily.  . [DISCONTINUED] amLODipine (NORVASC) 5 MG tablet Take 1 tablet (5 mg total) by mouth daily.  . [DISCONTINUED] bisoprolol (ZEBETA) 10 MG tablet Take 1 tablet (10 mg total) by mouth daily.   Current Facility-Administered Medications for the 03/27/20 encounter (Office Visit) with Sanda Klein, MD  Medication  . sodium chloride flush (NS) 0.9 % injection 3 mL     Allergies:   Cefzil [cefprozil]   Social History   Socioeconomic History  . Marital  status: Married    Spouse name: Not on file  . Number of children: Not on file  . Years of education: Not on file  . Highest education level: Not on file  Occupational History  . Not on file  Tobacco Use  . Smoking status: Never Smoker  . Smokeless tobacco: Never Used  Substance and Sexual Activity  . Alcohol use: No    Alcohol/week: 0.0 standard drinks  . Drug use: No  . Sexual activity: Not on file  Other Topics Concern  . Not on file  Social History Narrative  . Not on file   Social Determinants of Health   Financial Resource Strain: Not on file  Food Insecurity: Not on file  Transportation Needs: Not on file  Physical Activity: Not on file  Stress: Not on file  Social Connections: Not on file     Family History: The patient's family history includes Arthritis in an other family member; Heart disease in an other family member; Osteoporosis in an other family member. There is no history of Colon cancer or Colon polyps.  Father lived to 65 years old, has an older sister 21 years old  who is healthy, had another sister died at age 15 of a lung infection.  1 child (son), healthy.  ROS:   Please see the history of present illness. All other systems are reviewed and are negative.    EKGs/Labs/Other Studies Reviewed:    The following studies were reviewed today: Echocardiogram 08/27/2019  1. LVOT peak gradient 213 mmHg. Left ventricular ejection fraction, by estimation, is 70 to 75%. The left ventricle has hyperdynamic function. The left ventricle has no regional wall motion abnormalities. There is mild concentric left ventricular hypertrophy. Left ventricular diastolic parameters are consistent with Grade I diastolic dysfunction (impaired relaxation). Elevated left atrial pressure. 2. Right ventricular systolic function is normal. The right ventricular size is normal. There is normal pulmonary artery systolic pressure. 3. The mitral valve is grossly normal. Trivial mitral  valve regurgitation. 4. The aortic valve is tricuspid. Aortic valve regurgitation is mild. No aortic stenosis is present. 5. The inferior vena cava is normal in size with greater than 50% respiratory variability, suggesting right atrial pressure of 3 mmHg.   Echocardiogram 11/26/2018: 1. The left ventricle has a visually estimated ejection fraction of 75%.  The cavity size was normal. Left ventricular diastolic Doppler parameters  are consistent with impaired relaxation. Elevated left ventricular  end-diastolic pressure No evidence of  left ventricular regional wall motion abnormalities.  2. The right ventricle has normal systolic function. The cavity was  normal. There is no increase in right ventricular wall thickness.  3. The aortic valve is tricuspid. Mild thickening of the aortic valve.  Aortic valve regurgitation is mild by color flow Doppler. Mild aortic  annular calcification noted.  4. The mitral valve is abnormal. Moderate thickening of the mitral valve  leaflet. Mitral valve regurgitation is moderate by color flow Doppler.  5. Mitral chordal SAM is noted. LVOT is small. Peak gradient 145 mmHg.  6. The tricuspid valve is grossly normal. Tricuspid valve regurgitation  is moderate.  7. The aorta is normal unless otherwise noted.   Coronary CT angiography 12/03/2018: FINDINGS: Coronary calcium score: The patient's coronary artery calcium score is 149, which places the patient in the 85th percentile. Coronary arteries: Normal coronary origins. Left dominance Right Coronary Artery: Small, non-dominant vessel which gives off a larger AV nodal branch. No obstructive disease. Left Main Coronary Artery: Prominent left coronary cusp, but not anuerymsal. No stenosis. Gives off larger LAD and LCx arteries. Left Anterior Descending Coronary Artery: Calcified proximal vessel with mild 25-49% (CADRADS2) mixed stenosis. Gives off several small diagonal branches. Ramus  Intermedius Artery: Small branch without significant stenosis.  Left Circumflex Artery: Dominant vessel that gives off a small PDA artery. Larger caliber with minimal mixed 1-24% mid-vessel stenosis (CADRADS1).  Aorta: Normal size, 28 mm at the mid ascending aorta (level of the PA bifurcation) measured double oblique. Atherosclerosis. No dissection. Aortic Valve: Trileaflet. Calcification of the annulus.  Other findings: Thickening of the aortic and mitral valve leaflets. Normal biatrial size Normal pulmonary vein drainage into the left atrium. Normal left atrial appendage without a thrombus. Normal size of the pulmonary artery.  IMPRESSION: 1. Mild, non-obstructive CAD primarily of the LAD, CADRADS = 2. 2. Coronary calcium score of 149. This was 85th percentile for age and sex matched control. 3. Normal coronary origin with left dominance. 4. Thickening of the aortic and mitral valve leaflets  MCOT monitor 07/14/2017  Sinus rhythm with frequent PVCs. There was one episode of rapid atrial fibrillation.   EKG:  EKG is ordered today and  it shows atrial paced, ventricular sensed rhythm with left bundle branch block.  QTc 438 ms. Recent Labs: 02/16/2020: BUN 9; Creatinine, Ser 0.80; Hemoglobin 11.3; Platelets 213; Potassium 4.3; Sodium 142  10/07/2019 Creatinine 0.86, glucose 92, potassium 4.0, normal liver function test Hemoglobin 8.7 with low MCV TSH 3.600 Recent Lipid Panel No results found for: CHOL, TRIG, HDL, CHOLHDL, VLDL, LDLCALC, LDLDIRECT 10/07/2019 Total cholesterol 171, HDL 56, LDL 86, triglycerides 145 Physical Exam:    VS:  BP (!) 159/67   Pulse 61   Ht 5\' 2"  (1.575 m)   Wt 139 lb 3.2 oz (63.1 kg)   SpO2 100%   BMI 25.46 kg/m     Wt Readings from Last 3 Encounters:  03/27/20 139 lb 3.2 oz (63.1 kg)  03/06/20 136 lb (61.7 kg)  02/28/20 140 lb (63.5 kg)     General: Alert, oriented x3, no distress, appears well.  The left subclavian pacemaker site incision  is well-healed.  She still has a dusky purplish maculopapular rash over the upper half of her left chest, now looks more dry and slightly scaly. Head: no evidence of trauma, PERRL, EOMI, no exophtalmos or lid lag, no myxedema, no xanthelasma; normal ears, nose and oropharynx Neck: normal jugular venous pulsations and no hepatojugular reflux; brisk carotid pulses without delay and no carotid bruits Chest: clear to auscultation, no signs of consolidation by percussion or palpation, normal fremitus, symmetrical and full respiratory excursions Cardiovascular: normal position and quality of the apical impulse, regular rhythm, normal first and paradoxically split second heart sounds, no murmurs, rubs or gallops.  A left lower sternal border late systolic murmur can be elicited only with a Valsalva maneuver. Abdomen: no tenderness or distention, no masses by palpation, no abnormal pulsatility or arterial bruits, normal bowel sounds, no hepatosplenomegaly Extremities: no clubbing, cyanosis or edema; 2+ radial, ulnar and brachial pulses bilaterally; 2+ right femoral, posterior tibial and dorsalis pedis pulses; 2+ left femoral, posterior tibial and dorsalis pedis pulses; no subclavian or femoral bruits Neurological: grossly nonfocal Psych: Normal mood and affect   ASSESSMENT:    1. HOCM (hypertrophic obstructive cardiomyopathy) (HCC)   2. SSS (sick sinus syndrome) (HCC)   3. Pruritic rash   4. Pacemaker   5. Essential hypertension   6. Paroxysmal atrial fibrillation (HCC)   7. Long term (current) use of anticoagulants   8. Elevated coronary artery calcium score   9. Hypercholesterolemia    PLAN:    In order of problems listed above:  1. HOCM: Both her physical exam and her echocardiograms are highly suggestive of hypertrophic obstructive cardiomyopathy.  Try to avoid diuretics and potent vasodilators.  We will continue to increase the beta-blocker dose now that she has a pacemaker and try to  discontinue amlodipine. 2. SSS: Symptoms have improved following pacemaker implantation.  I am not sure if this is because of the faster heart rate itself or because of the ability to prescribe her more beta-blockers. 3. Rash: Suspect contact dermatitis related to the surgical scrub or the [place].   4. Pacemaker: Site has healed well.  Device was not interrogated today.  She has an appointment scheduled for March 14 when we will plan to bring outputs down to chronic levels, adjust rate response sensor settings and if necessary shorten the AV delay to commit her to ventricular pacing in an effort to reduce the outflow tract obstruction further. 5. HTN:   We will increase the dose of beta-blocker.  Try to stop the amlodipine (this  may worsen the inducible LV outflow tract gradient and has a an adverse interaction with her simvastatin). 6. AFib: I am not confident that she ever had paroxysmal atrial fibrillation, but she definitely has episodes of paroxysmal atrial tachycardia and due to her underlying structural heart disease is at high risk for developing atrial fibrillation.  So far she is tolerating anticoagulation well and at least clinically her anemia seems to be improving.  We will use her pacemaker for rhythm monitoring if she does not have atrial fibrillation for a long period of time (several months to a year), we can consider discontinuing her anticoagulation. 7. Anticoagulation: Most recent hemoglobin level was 11.3  oon November 17 just before pacemaker implantation.  The erythrocyte indices are normocytic normochromic.  Continue iron supplements.   8. Elevated coronary calcium score: He does not have angina pectoris at rest with activity and had a low risk nuclear stress test.  The focus is on risk factor modification.  She is not on aspirin while she takes full anticoagulation.  On statin.  We will treat for target LDL less than 70. 9. HLP: Target LDL cholesterol should ideally be less than 70.   Was 61 on most recent assessment, improving. On simvastatin.  Stopping the amlodipine due to interaction.   Medication Adjustments/Labs and Tests Ordered: Current medicines are reviewed at length with the patient today.  Concerns regarding medicines are outlined above.  Orders Placed This Encounter  Procedures  . EKG 12-Lead   Meds ordered this encounter  Medications  . bisoprolol (ZEBETA) 10 MG tablet    Sig: Take 1 tablet (10 mg total) by mouth in the morning and at bedtime.    Dispense:  60 tablet    Refill:  11    Patient Instructions  Medication Instructions:  STOP the Amlodipine INCREASE the Bisoprolol 10 mg twice daily  *If you need a refill on your cardiac medications before your next appointment, please call your pharmacy*   Lab Work: None ordered If you have labs (blood work) drawn today and your tests are completely normal, you will receive your results only by: Marland Kitchen MyChart Message (if you have MyChart) OR . A paper copy in the mail If you have any lab test that is abnormal or we need to change your treatment, we will call you to review the results.   Testing/Procedures: None ordered   Follow-Up: At [facility], you and your health needs are our priority.  As part of our continuing mission to provide you with exceptional heart care, we have created designated Provider Care Teams.  These Care Teams include your primary Cardiologist (physician) and Advanced Practice Providers (APPs -  Physician Assistants and Nurse Practitioners) who all work together to provide you with the care you need, when you need it.  We recommend signing up for the patient portal called "MyChart".  Sign up information is provided on this After Visit Summary.  MyChart is used to connect with patients for Virtual Visits (Telemedicine).  Patients are able to view lab/test results, encounter notes, upcoming appointments, etc.  Non-urgent messages can be sent to your provider as well.   To learn  more about what you can do with MyChart, go to [email or web address].    Your next appointment:   Keep your follow up with Dr. Sallyanne Kuster on March 14 at 9:40 am    Signed, Sanda Klein, MD  03/28/2020 9:39 AM    [place]

## 2020-04-11 ENCOUNTER — Ambulatory Visit: Payer: Medicare PPO | Admitting: Cardiovascular Disease

## 2020-05-17 DIAGNOSIS — Z6825 Body mass index (BMI) 25.0-25.9, adult: Secondary | ICD-10-CM | POA: Diagnosis not present

## 2020-05-17 DIAGNOSIS — M5430 Sciatica, unspecified side: Secondary | ICD-10-CM | POA: Diagnosis not present

## 2020-05-24 DIAGNOSIS — M5430 Sciatica, unspecified side: Secondary | ICD-10-CM | POA: Diagnosis not present

## 2020-05-24 DIAGNOSIS — Z6825 Body mass index (BMI) 25.0-25.9, adult: Secondary | ICD-10-CM | POA: Diagnosis not present

## 2020-05-26 ENCOUNTER — Telehealth: Payer: Self-pay | Admitting: Cardiovascular Disease

## 2020-05-26 DIAGNOSIS — R55 Syncope and collapse: Secondary | ICD-10-CM | POA: Diagnosis not present

## 2020-05-26 DIAGNOSIS — Z6825 Body mass index (BMI) 25.0-25.9, adult: Secondary | ICD-10-CM | POA: Diagnosis not present

## 2020-05-26 NOTE — Telephone Encounter (Signed)
Called and let the patient know about her transmission. She stated that she was feeling fine now. She has been advised to call for worsening symptoms and has been made aware about the on call services.   She has an appointment on 3/14 with Dr. Sallyanne Kuster.

## 2020-05-26 NOTE — Telephone Encounter (Signed)
There are not alerts on her device. Presenting is AS/VS 70's. Advised patient I will forward back to Dr. Sallyanne Kuster to advise.                                                                                                                                                                                    [place]

## 2020-05-26 NOTE — Telephone Encounter (Signed)
Follow Up:     Alexandria French is returning [name]'s call from today.

## 2020-05-26 NOTE — Telephone Encounter (Signed)
Spoke with Melissa at the PCP. She stated that the patient passed out yesterday and wanted the patient to be seen next week.  Call placed to the patient. She stated that she passed out yesterday while at home. Her husband was with her at the time and she was not hurt. She stated that she felt weak before it happened.  Message will be sent to the device clinic to see if they can help her do an at home transmission.

## 2020-05-26 NOTE — Telephone Encounter (Signed)
Left a message for the patient to call back to see if she can do a transmission from home. Pacemaker implant on 02/28/20  Left a message for Melissa to call back from the PCP office.

## 2020-05-26 NOTE — Telephone Encounter (Signed)
Pt c/o Syncope: STAT if syncope occurred within 30 minutes and pt complains of lightheadedness High Priority if episode of passing out, completely, today or in last 24 hours   1. Did you pass out today?   No, Melissa with [facility] states the patient passed out yesterday, 05/25/20  2. When is the last time you passed out?  On 05/25/20 around 2:00 PM   3. Has this occurred multiple times? No    4. Did you have any symptoms prior to passing out? No    [facility] phone #: [phone]

## 2020-05-29 ENCOUNTER — Ambulatory Visit (INDEPENDENT_AMBULATORY_CARE_PROVIDER_SITE_OTHER): Payer: Medicare PPO

## 2020-05-29 DIAGNOSIS — I495 Sick sinus syndrome: Secondary | ICD-10-CM | POA: Diagnosis not present

## 2020-05-30 ENCOUNTER — Other Ambulatory Visit: Payer: Self-pay | Admitting: Family Medicine

## 2020-05-31 LAB — CUP PACEART REMOTE DEVICE CHECK
Battery Remaining Longevity: 156 mo
Battery Remaining Percentage: 100 %
Brady Statistic RA Percent Paced: 87 %
Brady Statistic RV Percent Paced: 0 %
Date Time Interrogation Session: 20220228021100
Implantable Lead Implant Date: 20211129
Implantable Lead Implant Date: 20211129
Implantable Lead Location: 753859
Implantable Lead Location: 753860
Implantable Lead Model: 7840
Implantable Lead Model: 7841
Implantable Lead Serial Number: 1036898
Implantable Lead Serial Number: 1100104
Implantable Pulse Generator Implant Date: 20211129
Lead Channel Impedance Value: 666 Ohm
Lead Channel Impedance Value: 725 Ohm
Lead Channel Pacing Threshold Amplitude: 0.4 V
Lead Channel Pacing Threshold Amplitude: 0.7 V
Lead Channel Pacing Threshold Pulse Width: 0.4 ms
Lead Channel Pacing Threshold Pulse Width: 0.4 ms
Lead Channel Setting Pacing Amplitude: 3.5 V
Lead Channel Setting Pacing Amplitude: 3.5 V
Lead Channel Setting Pacing Pulse Width: 0.4 ms
Lead Channel Setting Sensing Sensitivity: 2.5 mV
Pulse Gen Serial Number: 951099

## 2020-06-02 DIAGNOSIS — R55 Syncope and collapse: Secondary | ICD-10-CM | POA: Diagnosis not present

## 2020-06-02 DIAGNOSIS — Z6825 Body mass index (BMI) 25.0-25.9, adult: Secondary | ICD-10-CM | POA: Diagnosis not present

## 2020-06-06 NOTE — Progress Notes (Signed)
Remote pacemaker transmission.   

## 2020-06-12 ENCOUNTER — Ambulatory Visit (INDEPENDENT_AMBULATORY_CARE_PROVIDER_SITE_OTHER): Payer: Medicare PPO | Admitting: Cardiovascular Disease

## 2020-06-12 ENCOUNTER — Other Ambulatory Visit: Payer: Self-pay

## 2020-06-12 ENCOUNTER — Encounter: Payer: Self-pay | Admitting: Cardiovascular Disease

## 2020-06-12 VITALS — BP 175/78 | HR 64 | Ht 62.0 in | Wt 139.2 lb

## 2020-06-12 DIAGNOSIS — E7800 Pure hypercholesterolemia, unspecified: Secondary | ICD-10-CM | POA: Diagnosis not present

## 2020-06-12 DIAGNOSIS — I421 Obstructive hypertrophic cardiomyopathy: Secondary | ICD-10-CM | POA: Diagnosis not present

## 2020-06-12 DIAGNOSIS — R931 Abnormal findings on diagnostic imaging of heart and coronary circulation: Secondary | ICD-10-CM | POA: Diagnosis not present

## 2020-06-12 DIAGNOSIS — I495 Sick sinus syndrome: Secondary | ICD-10-CM

## 2020-06-12 DIAGNOSIS — Z7901 Long term (current) use of anticoagulants: Secondary | ICD-10-CM | POA: Diagnosis not present

## 2020-06-12 DIAGNOSIS — I480 Paroxysmal atrial fibrillation: Secondary | ICD-10-CM | POA: Diagnosis not present

## 2020-06-12 DIAGNOSIS — I10 Essential (primary) hypertension: Secondary | ICD-10-CM | POA: Diagnosis not present

## 2020-06-12 DIAGNOSIS — Z950 Presence of cardiac pacemaker: Secondary | ICD-10-CM

## 2020-06-12 MED ORDER — BISOPROLOL FUMARATE 10 MG PO TABS
10.0000 mg | ORAL_TABLET | Freq: Every day | ORAL | 4 refills | Status: DC
Start: 2020-06-12 — End: 2020-07-04

## 2020-06-12 NOTE — Patient Instructions (Addendum)
Medication Instructions:  STOP the Lisinopril TAKE Bisoprolol 10 mg once daily  *If you need a refill on your cardiac medications before your next appointment, please call your pharmacy*   Lab Work: None ordered If you have labs (blood work) drawn today and your tests are completely normal, you will receive your results only by: Marland Kitchen MyChart Message (if you have MyChart) OR . A paper copy in the mail If you have any lab test that is abnormal or we need to change your treatment, we will call you to review the results.   Testing/Procedures: None ordered   Follow-Up: At [facility], you and your health needs are our priority.  As part of our continuing mission to provide you with exceptional heart care, we have created designated Provider Care Teams.  These Care Teams include your primary Cardiologist (physician) and Advanced Practice Providers (APPs -  Physician Assistants and Nurse Practitioners) who all work together to provide you with the care you need, when you need it.  We recommend signing up for the patient portal called "MyChart".  Sign up information is provided on this After Visit Summary.  MyChart is used to connect with patients for Virtual Visits (Telemedicine).  Patients are able to view lab/test results, encounter notes, upcoming appointments, etc.  Non-urgent messages can be sent to your provider as well.   To learn more about what you can do with MyChart, go to [email or web address].    Your next appointment:   3 month(s) on a device day  The format for your next appointment:   In Person  Provider:   Sanda Klein, MD

## 2020-06-12 NOTE — Progress Notes (Signed)
Cardiology Office Note:    Date:  06/14/2020   ID:  Alexandria French, DOB 1950-12-17, MRN [id number]  PCP:  Klemme Nation, MD  [facility] Cardiologist:  [facility] Electrophysiologist:  None   Referring MD: Rory Percy, MD   Chief Complaint  Patient presents with  . Cardiomyopathy  . Pacemaker Check    History of Present Illness:    Alexandria French is a 70 y.o. 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  Feels better on beta-blockers, but the dose was limited by bradycardia.  Underwent pacemaker implantation in late November 2021.  The pacemaker site has healed well, but she continues to have dyspnea and fatigue.  She is disappointed that she has not had the significant improvement that she expected from the pacemaker.  A few days ago she experienced an episode of syncope while bending over and taking clothes out of the dryer.  She had been feeling weak that day and her blood pressure was 99/57 earlier..  This was  followed by 2 or 3 days of diarrhea (not melena per her report).  At that time all her medications were stopped.  Before this all happened, she noticed that at home her diastolic blood pressure would often be as low as the 40s or 50s.  Subsequently her blood pressure has been very high and she just recently started on half of her previous doses of bisoprolol and lisinopril.  Today her blood pressure is high: 218/88 on presentation, 175/78 after resting for several minutes.  She has not had chest pain or any shortness of breath at rest/orthopnea/PND and denies lower  extremity edema.  Recent labs on 02/25 showed that she was anemic with a hemoglobin of 9.4 and her creatinine of 1.17 and BUN of 20 were roughly double her previous values (BUN 9, creatinine 0.8).  Pacemaker interrogation shows normal device function.  Her [employer]ade device has 13.5 years of estimated longevity.  She has 80% atrial pacing and less than 1% ventricular pacing.  The heart rate histogram is very flat, consistent with an inadequate sensor setting.  Minute ventilation response was increased today.  Lead parameters are excellent.  Also shortened the AV delay to 187/200 paced to see if committed ventricular pacing may lead to some improvement in the symptoms of obstructive cardiomyopathy.  She wore an event monitor that showed prominent sinus bradycardia and chronotropic incompetence, with a maximum heart rate but only reached 60% of max predicted.  She did have frequent mostly monomorphic PVCs (6% of all beats).  Also had some brief bursts of nonsustained atrial tachycardia.  There was no complex ventricular arrhythmia. She has not had full syncope previously or documented VT. There is no family history of HCM, VT, sudden death or CHF.  2 separate echocardiograms performed in August 2020 in May 2021 show findings consistent with hypertrophic obstructive cardiomyopathy, although the degree of left ventricular hypertrophy is relatively mild and appears to be concentric rather than asymmetrical.  Systolic anterior motion of the mitral valve with increased gradients across the left ventricular outflow tract.  These have been calculated at 145 mmHg and 213 mmHg respectively, although I am not sure that the estimation is  accurate.  There seems to be a lot of contamination with mitral regurgitant flow on the Doppler signal.  There is however convincing evidence of gradients across the LVOT of at least 50 mmHg at rest.  On both studies the left ventricular systolic function is hyperdynamic  with ejection fraction in excess of 75%.  There is no evidence of major abnormalities of the aortic valve itself.  Coronary CT angiogram performed in September 2020 showed a calcium score in the 85th percentile with no major coronary artery stenoses.  The coronary system is left dominant.  There is very mild bilateral carotid artery plaque with stenoses less than 40%.  Atrial fibrillation was reportedly detected on a previous cardiac monitor in June 2019.  The ventricular rate was over 178 bpm.  The episode was associated with dizziness and lightheadedness.  I am not sure it can accurately be categorized as atrial fibrillation since it was so brief, but she definitely had at least paroxysmal atrial tachycardia.  Although frequent PVCs including ventricular bigeminy were recorded, there was no evidence of ventricular tachycardia.  She has a history of essential hypertension that has been mostly managed with lisinopril over the years.  Past Medical History:  Diagnosis Date  . A-fib ([place])   . CAD (coronary artery disease)   . Esophageal reflux    no meds  . Hypertension   . Other and unspecified hyperlipidemia     Past Surgical History:  Procedure Laterality Date  . ABDOMINAL HYSTERECTOMY  08/2008  . BLADDER REPAIR    . PACEMAKER IMPLANT N/A 02/28/2020   Procedure: PACEMAKER IMPLANT;  Surgeon: Sanda Klein, MD;  Location: [place] [facility];  Service: Cardiovascular;  Laterality: N/A;  . TONSILLECTOMY      Current Medications: Current Meds  Medication Sig  . acetaminophen (TYLENOL) 500 MG tablet Take 500-1,000 mg by mouth 2 (two) times daily as needed (back pain.).  Marland Kitchen apixaban (ELIQUIS) 5 MG TABS tablet Take 1 tablet (5 mg total) by mouth 2 (two) times daily. Restart on 02/29/2020 in the evening  . Calcium Carbonate-Vitamin D 600-400 MG-UNIT tablet Take 1 tablet by mouth 2 (two) times daily.   . cetirizine (ZYRTEC) 10 MG tablet Take 10 mg by mouth every evening.   . Coenzyme Q10  (COQ-10) 100 MG CAPS Take 100 mg by mouth daily.   . ferrous sulfate 325 (65 FE) MG EC tablet Take 325 mg by mouth in the morning and at bedtime.  . fluticasone (FLONASE) 50 MCG/ACT nasal spray Place 2 sprays into both nostrils daily as needed (allergies.).   Marland Kitchen LORazepam (ATIVAN) 1 MG tablet Take 0.5-1 mg by mouth at bedtime as needed for anxiety or sleep.   . simvastatin (ZOCOR) 20 MG tablet Take 20 mg by mouth daily.  . vitamin C (ASCORBIC ACID) 500 MG tablet Take 500 mg by mouth daily.  . [DISCONTINUED] bisoprolol (ZEBETA) 10 MG tablet Take 1 tablet (10 mg total) by mouth in the morning and at bedtime.  . [DISCONTINUED] lisinopril (ZESTRIL) 20 MG tablet TAKE 1 TABLET BY MOUTH TWICE A DAY  . [DISCONTINUED] triamcinolone (KENALOG) 0.025 % ointment Apply 1 application topically 2 (two) times daily.   Current Facility-Administered Medications for the 06/12/20 encounter (Office Visit) with Sanda Klein, MD  Medication  . sodium chloride flush (NS) 0.9 % injection 3 mL     Allergies:   Cefzil [cefprozil]   Social History   Socioeconomic History  . Marital status: Married    Spouse name:  Not on file  . Number of children: Not on file  . Years of education: Not on file  . Highest education level: Not on file  Occupational History  . Not on file  Tobacco Use  . Smoking status: Never Smoker  . Smokeless tobacco: Never Used  Substance and Sexual Activity  . Alcohol use: No    Alcohol/week: 0.0 standard drinks  . Drug use: No  . Sexual activity: Not on file  Other Topics Concern  . Not on file  Social History Narrative  . Not on file   Social Determinants of Health   Financial Resource Strain: Not on file  Food Insecurity: Not on file  Transportation Needs: Not on file  Physical Activity: Not on file  Stress: Not on file  Social Connections: Not on file     Family History: The patient's family history includes Arthritis in an other family member; Heart disease in an other  family member; Osteoporosis in an other family member. There is no history of Colon cancer or Colon polyps.  Father lived to 18 years old, has an older sister 67 years old who is healthy, had another sister died at age 63 of a lung infection.  1 child (son), healthy.  ROS:   Please see the history of present illness. All other systems are reviewed and are negative.   EKGs/Labs/Other Studies Reviewed:    The following studies were reviewed today: Echocardiogram 08/27/2019  1. LVOT peak gradient 213 mmHg. Left ventricular ejection fraction, by estimation, is 70 to 75%. The left ventricle has hyperdynamic function. The left ventricle has no regional wall motion abnormalities. There is mild concentric left ventricular hypertrophy. Left ventricular diastolic parameters are consistent with Grade I diastolic dysfunction (impaired relaxation). Elevated left atrial pressure. 2. Right ventricular systolic function is normal. The right ventricular size is normal. There is normal pulmonary artery systolic pressure. 3. The mitral valve is grossly normal. Trivial mitral valve regurgitation. 4. The aortic valve is tricuspid. Aortic valve regurgitation is mild. No aortic stenosis is present. 5. The inferior vena cava is normal in size with greater than 50% respiratory variability, suggesting right atrial pressure of 3 mmHg.   Echocardiogram 11/26/2018: 1. The left ventricle has a visually estimated ejection fraction of 75%.  The cavity size was normal. Left ventricular diastolic Doppler parameters  are consistent with impaired relaxation. Elevated left ventricular  end-diastolic pressure No evidence of  left ventricular regional wall motion abnormalities.  2. The right ventricle has normal systolic function. The cavity was  normal. There is no increase in right ventricular wall thickness.  3. The aortic valve is tricuspid. Mild thickening of the aortic valve.  Aortic valve regurgitation is mild  by color flow Doppler. Mild aortic  annular calcification noted.  4. The mitral valve is abnormal. Moderate thickening of the mitral valve  leaflet. Mitral valve regurgitation is moderate by color flow Doppler.  5. Mitral chordal SAM is noted. LVOT is small. Peak gradient 145 mmHg.  6. The tricuspid valve is grossly normal. Tricuspid valve regurgitation  is moderate.  7. The aorta is normal unless otherwise noted.   Coronary CT angiography 12/03/2018: FINDINGS: Coronary calcium score: The patient's coronary artery calcium score is 149, which places the patient in the 85th percentile. Coronary arteries: Normal coronary origins. Left dominance Right Coronary Artery: Small, non-dominant vessel which gives off a larger AV nodal branch. No obstructive disease. Left Main Coronary Artery: Prominent left coronary cusp, but not anuerymsal. No stenosis.  Gives off larger LAD and LCx arteries. Left Anterior Descending Coronary Artery: Calcified proximal vessel with mild 25-49% (CADRADS2) mixed stenosis. Gives off several small diagonal branches. Ramus Intermedius Artery: Small branch without significant stenosis.  Left Circumflex Artery: Dominant vessel that gives off a small PDA artery. Larger caliber with minimal mixed 1-24% mid-vessel stenosis (CADRADS1).  Aorta: Normal size, 28 mm at the mid ascending aorta (level of the PA bifurcation) measured double oblique. Atherosclerosis. No dissection. Aortic Valve: Trileaflet. Calcification of the annulus.  Other findings: Thickening of the aortic and mitral valve leaflets. Normal biatrial size Normal pulmonary vein drainage into the left atrium. Normal left atrial appendage without a thrombus. Normal size of the pulmonary artery.  IMPRESSION: 1. Mild, non-obstructive CAD primarily of the LAD, CADRADS = 2. 2. Coronary calcium score of 149. This was 85th percentile for age and sex matched control. 3. Normal coronary origin with left  dominance. 4. Thickening of the aortic and mitral valve leaflets  MCOT monitor 07/14/2017  Sinus rhythm with frequent PVCs. There was one episode of rapid atrial fibrillation.   EKG:  EKG is ordered today and it shows atrial paced, ventricular sensed rhythm with left bundle branch block.  The PR interval is 192 ms.  The QRS is broader 134 ms.  QTc 455 ms. Recent Labs: 02/16/2020: BUN 9; Creatinine, Ser 0.80; Hemoglobin 11.3; Platelets 213; Potassium 4.3; Sodium 142  10/07/2019 Creatinine 0.86, glucose 92, potassium 4.0, normal liver function test Hemoglobin 8.7 with low MCV TSH 3.600 Recent Lipid Panel No results found for: CHOL, TRIG, HDL, CHOLHDL, VLDL, LDLCALC, LDLDIRECT 10/07/2019 Total cholesterol 171, HDL 56, LDL 86, triglycerides 145 Physical Exam:    VS:  BP (!) 175/78   Pulse 64   Ht 5\' 2"  (1.575 m)   Wt 139 lb 3.2 oz (63.1 kg)   SpO2 98%   BMI 25.46 kg/m     Wt Readings from Last 3 Encounters:  06/12/20 139 lb 3.2 oz (63.1 kg)  03/27/20 139 lb 3.2 oz (63.1 kg)  03/06/20 136 lb (61.7 kg)     General: Alert, oriented x3, no distress, well-healed pacemaker site.  The rash has resolved. Head: no evidence of trauma, PERRL, EOMI, no exophtalmos or lid lag, no myxedema, no xanthelasma; normal ears, nose and oropharynx Neck: normal jugular venous pulsations and no hepatojugular reflux; brisk carotid pulses without delay and no carotid bruits Chest: clear to auscultation, no signs of consolidation by percussion or palpation, normal fremitus, symmetrical and full respiratory excursions Cardiovascular: normal position and quality of the apical impulse, regular rhythm, normal first and paradoxically split second heart sounds, 4-4/9 systolic ejection murmur that increases slightly with a Valsalva maneuver but does not resolve with handgrip, no diastolic murmurs, rubs or gallops Abdomen: no tenderness or distention, no masses by palpation, no abnormal pulsatility or arterial bruits,  normal bowel sounds, no hepatosplenomegaly Extremities: no clubbing, cyanosis or edema; 2+ radial, ulnar and brachial pulses bilaterally; 2+ right femoral, posterior tibial and dorsalis pedis pulses; 2+ left femoral, posterior tibial and dorsalis pedis pulses; no subclavian or femoral bruits Neurological: grossly nonfocal Psych: Normal mood and affect   ASSESSMENT:    1. HOCM (hypertrophic obstructive cardiomyopathy) ([place])   2. SSS (sick sinus syndrome) ([place])   3. Pacemaker   4. Essential hypertension   5. Paroxysmal atrial fibrillation ([facility])   6. Long term (current) use of anticoagulants   7. Elevated coronary artery calcium score   8. Hypercholesterolemia    PLAN:  In order of problems listed above:  1. HOCM: Both her physical exam and her echocardiograms are highly suggestive of hypertrophic obstructive cardiomyopathy.  Her murmur is louder today, after stopping medications.  We will start her back on her bisoprolol.  Would rather use a higher dose of bisoprolol than add lisinopril.  Avoid potent vasodilators.  Avoid diuretics.  Her episode of diarrhea probably led to hypovolemia and subsequent increase in gradient and syncope.  We discussed the pathophysiology of hypertrophic obstructive cardiomyopathy and why she needs to avoid hypovolemia. 2. SSS: Symptoms had initially improved following pacemaker implantation.  Her heart rate histograms are very blunted.  Increase the effect of minute ventilation sensor on her heart rate, but need to avoid tachycardia which could worsen LVOT obstruction.  3. Pacemaker: Site has healed well.  Decrease the AV delay, but this may not be enough to generate truly committed V pacing.  We will revisit this at her next appointment.  Adjusted rate response sensor settings, making it more aggressive and relying more on the effective mode of ventilation rather than the accelerometer. 4. HTN:   Restarted bisoprolol.  Try to maximize the dose of bisoprolol  before we add lisinopril again. 5. AFib: Her pacemaker shows evidence of an episode of atrial fibrillation that occurred in December.  I am afraid she will have to remain on long-term anticoagulation.  Continue to monitor using her device. 6. Anticoagulation: Hemoglobin has decreased from 11.3 in November to 9.4 in February. Watch for GI bleeding. Does not think her diarrhea last month was consistent with melena. 7. Elevated coronary calcium score: She does not have angina pectoris at rest or with activity and had a low risk nuclear stress test.  The focus is on risk factor modification.  She is not on aspirin while she takes full anticoagulation.  On statin.  We will treat for target LDL less than 70. 8. HLP: Target LDL cholesterol should ideally be less than 70.  Was 63 on most recent assessment, improving. On simvastatin.  Stopping the amlodipine due to interaction.   Medication Adjustments/Labs and Tests Ordered: Current medicines are reviewed at length with the patient today.  Concerns regarding medicines are outlined above.  Orders Placed This Encounter  Procedures  . EKG 12-Lead   Meds ordered this encounter  Medications  . bisoprolol (ZEBETA) 10 MG tablet    Sig: Take 1 tablet (10 mg total) by mouth daily.    Dispense:  30 tablet    Refill:  4    Patient Instructions  Medication Instructions:  STOP the Lisinopril TAKE Bisoprolol 10 mg once daily  *If you need a refill on your cardiac medications before your next appointment, please call your pharmacy*   Lab Work: None ordered If you have labs (blood work) drawn today and your tests are completely normal, you will receive your results only by: Marland Kitchen MyChart Message (if you have MyChart) OR . A paper copy in the mail If you have any lab test that is abnormal or we need to change your treatment, we will call you to review the results.   Testing/Procedures: None ordered   Follow-Up: At [facility], you and your health  needs are our priority.  As part of our continuing mission to provide you with exceptional heart care, we have created designated Provider Care Teams.  These Care Teams include your primary Cardiologist (physician) and Advanced Practice Providers (APPs -  Physician Assistants and Nurse Practitioners) who all work together to provide  you with the care you need, when you need it.  We recommend signing up for the patient portal called "MyChart".  Sign up information is provided on this After Visit Summary.  MyChart is used to connect with patients for Virtual Visits (Telemedicine).  Patients are able to view lab/test results, encounter notes, upcoming appointments, etc.  Non-urgent messages can be sent to your provider as well.   To learn more about what you can do with MyChart, go to [email or web address].    Your next appointment:   3 month(s) on a device day  The format for your next appointment:   In Person  Provider:   Sanda Klein, MD      Signed, Sanda Klein, MD  06/14/2020 6:29 PM    [place]

## 2020-06-14 ENCOUNTER — Encounter: Payer: Self-pay | Admitting: Cardiovascular Disease

## 2020-06-16 DIAGNOSIS — Z6825 Body mass index (BMI) 25.0-25.9, adult: Secondary | ICD-10-CM | POA: Diagnosis not present

## 2020-06-16 DIAGNOSIS — R55 Syncope and collapse: Secondary | ICD-10-CM | POA: Diagnosis not present

## 2020-07-04 ENCOUNTER — Telehealth: Payer: Self-pay | Admitting: Cardiovascular Disease

## 2020-07-04 ENCOUNTER — Other Ambulatory Visit: Payer: Self-pay | Admitting: Family Medicine

## 2020-07-04 MED ORDER — BISOPROLOL FUMARATE 10 MG PO TABS: ORAL_TABLET | ORAL | 3 refills | Status: DC

## 2020-07-04 NOTE — Telephone Encounter (Signed)
OK. Let's try bisoprolol 10 mg in AM and 5 mg in PM again, please.

## 2020-07-04 NOTE — Telephone Encounter (Signed)
Pt c/o BP issue: STAT if pt c/o blurred vision, one-sided weakness or slurred speech  1. What are your last 5 BP readings? 182/??  2. Are you having any other symptoms (ex. Dizziness, headache, blurred vision, passed out)? Left cheek turns red and warm to the touch. Denies any other symptoms.   3. What is your BP issue? Patient states that Dr. Sallyanne Kuster took her off her BP medication, lisinopril, she states that her BP at night has been going up. Last night it did not get above 301 systolic but the night before it got as high as 601 systolic. She would like to know if Dr. Sallyanne Kuster would like her to start taking again.

## 2020-07-04 NOTE — Telephone Encounter (Signed)
Spoke to patient Dr.Croitoru's advice given.Advised to continue to monitor B/P and call back if continues to be elevated.

## 2020-07-04 NOTE — Telephone Encounter (Signed)
Spoke to patient she stated on 3/14 Dr.Croitoru stopped Lisinopril.Stated she is taking Bisoprolol 10 mg daily.B/P has elevated 161/69,146/59,154/62,155/61,180/80,155/67.Pulse in 60's.Advised I will send message to Dr.Croitoru for advice.

## 2020-07-19 DIAGNOSIS — R55 Syncope and collapse: Secondary | ICD-10-CM | POA: Diagnosis not present

## 2020-07-19 DIAGNOSIS — Z6825 Body mass index (BMI) 25.0-25.9, adult: Secondary | ICD-10-CM | POA: Diagnosis not present

## 2020-07-25 ENCOUNTER — Other Ambulatory Visit: Payer: Self-pay | Admitting: Cardiovascular Disease

## 2020-08-29 ENCOUNTER — Ambulatory Visit (INDEPENDENT_AMBULATORY_CARE_PROVIDER_SITE_OTHER): Payer: Medicare PPO

## 2020-08-29 DIAGNOSIS — I495 Sick sinus syndrome: Secondary | ICD-10-CM

## 2020-08-30 LAB — CUP PACEART REMOTE DEVICE CHECK
Battery Remaining Longevity: 126 mo
Battery Remaining Percentage: 100 %
Brady Statistic RA Percent Paced: 88 %
Brady Statistic RV Percent Paced: 47 %
Date Time Interrogation Session: 20220601143500
Implantable Lead Implant Date: 20211129
Implantable Lead Implant Date: 20211129
Implantable Lead Location: 753859
Implantable Lead Location: 753860
Implantable Lead Model: 7840
Implantable Lead Model: 7841
Implantable Lead Serial Number: 1036898
Implantable Lead Serial Number: 1100104
Implantable Pulse Generator Implant Date: 20211129
Lead Channel Impedance Value: 654 Ohm
Lead Channel Impedance Value: 884 Ohm
Lead Channel Pacing Threshold Amplitude: 0.4 V
Lead Channel Pacing Threshold Amplitude: 0.7 V
Lead Channel Pacing Threshold Pulse Width: 0.4 ms
Lead Channel Pacing Threshold Pulse Width: 0.4 ms
Lead Channel Setting Pacing Amplitude: 3.5 V
Lead Channel Setting Pacing Amplitude: 3.5 V
Lead Channel Setting Pacing Pulse Width: 0.4 ms
Lead Channel Setting Sensing Sensitivity: 2.5 mV
Pulse Gen Serial Number: 951099

## 2020-08-31 ENCOUNTER — Telehealth: Payer: Self-pay | Admitting: Internal Medicine

## 2020-08-31 ENCOUNTER — Encounter: Payer: Self-pay | Admitting: Internal Medicine

## 2020-08-31 ENCOUNTER — Other Ambulatory Visit: Payer: Self-pay | Admitting: Family Medicine

## 2020-08-31 NOTE — Telephone Encounter (Signed)
Spoke with patient ans scheduled office visit for 10/23/20.

## 2020-08-31 NOTE — Telephone Encounter (Signed)
This patient requires office visit before consideration of repeat colonoscopy She had an incomplete colonoscopy in November 2021 due to tortuosity but it appears by the report that the procedure was terminated anesthesia She has history of iron deficiency anemia She is on anticoagulation She has hypertrophic obstructive cardiomyopathy and sees cardiology Any procedure will be higher than average risk and all of these factors in her history need to be discussed prior to consideration of colonoscopy

## 2020-08-31 NOTE — Telephone Encounter (Signed)
Good afternoon Dr. Hilarie Fredrickson, patient is wanting to schedule a colonoscopy.  Her last colon was 01/28/2020.  Will send records to you.  Can you please review and advise on scheduling?  Thank you.

## 2020-09-08 ENCOUNTER — Other Ambulatory Visit: Payer: Self-pay | Admitting: Family Medicine

## 2020-09-20 ENCOUNTER — Encounter: Payer: Self-pay | Admitting: *Deleted

## 2020-09-21 NOTE — Progress Notes (Signed)
Remote pacemaker transmission.   

## 2020-09-25 ENCOUNTER — Encounter: Payer: Medicare PPO | Admitting: Cardiovascular Disease

## 2020-09-28 ENCOUNTER — Other Ambulatory Visit: Payer: Self-pay | Admitting: Family Medicine

## 2020-09-29 ENCOUNTER — Encounter: Payer: Medicare PPO | Admitting: Cardiovascular Disease

## 2020-10-10 DIAGNOSIS — I10 Essential (primary) hypertension: Secondary | ICD-10-CM | POA: Diagnosis not present

## 2020-10-10 DIAGNOSIS — E7801 Familial hypercholesterolemia: Secondary | ICD-10-CM | POA: Diagnosis not present

## 2020-10-10 DIAGNOSIS — Z1329 Encounter for screening for other suspected endocrine disorder: Secondary | ICD-10-CM | POA: Diagnosis not present

## 2020-10-10 DIAGNOSIS — R5383 Other fatigue: Secondary | ICD-10-CM | POA: Diagnosis not present

## 2020-10-10 DIAGNOSIS — E7800 Pure hypercholesterolemia, unspecified: Secondary | ICD-10-CM | POA: Diagnosis not present

## 2020-10-10 DIAGNOSIS — D519 Vitamin B12 deficiency anemia, unspecified: Secondary | ICD-10-CM | POA: Diagnosis not present

## 2020-10-12 ENCOUNTER — Other Ambulatory Visit: Payer: Self-pay | Admitting: Family Medicine

## 2020-10-13 DIAGNOSIS — I447 Left bundle-branch block, unspecified: Secondary | ICD-10-CM | POA: Diagnosis not present

## 2020-10-13 DIAGNOSIS — Z23 Encounter for immunization: Secondary | ICD-10-CM | POA: Diagnosis not present

## 2020-10-13 DIAGNOSIS — R55 Syncope and collapse: Secondary | ICD-10-CM | POA: Diagnosis not present

## 2020-10-13 DIAGNOSIS — K625 Hemorrhage of anus and rectum: Secondary | ICD-10-CM | POA: Diagnosis not present

## 2020-10-13 DIAGNOSIS — N393 Stress incontinence (female) (male): Secondary | ICD-10-CM | POA: Diagnosis not present

## 2020-10-13 DIAGNOSIS — I421 Obstructive hypertrophic cardiomyopathy: Secondary | ICD-10-CM | POA: Diagnosis not present

## 2020-10-13 DIAGNOSIS — I10 Essential (primary) hypertension: Secondary | ICD-10-CM | POA: Diagnosis not present

## 2020-10-13 DIAGNOSIS — Z0001 Encounter for general adult medical examination with abnormal findings: Secondary | ICD-10-CM | POA: Diagnosis not present

## 2020-10-18 ENCOUNTER — Other Ambulatory Visit: Payer: Self-pay

## 2020-10-18 ENCOUNTER — Emergency Department (HOSPITAL_COMMUNITY): Payer: Medicare PPO

## 2020-10-18 ENCOUNTER — Emergency Department (HOSPITAL_COMMUNITY)
Admission: EM | Admit: 2020-10-18 | Discharge: 2020-10-18 | Disposition: A | Discharge status: Home / Self Care | Payer: Medicare PPO | Attending: Emergency Medicine | Admitting: Emergency Medicine

## 2020-10-18 DIAGNOSIS — R000 Tachycardia, unspecified: Secondary | ICD-10-CM | POA: Diagnosis not present

## 2020-10-18 DIAGNOSIS — I4891 Unspecified atrial fibrillation: Secondary | ICD-10-CM | POA: Insufficient documentation

## 2020-10-18 DIAGNOSIS — I10 Essential (primary) hypertension: Secondary | ICD-10-CM | POA: Insufficient documentation

## 2020-10-18 DIAGNOSIS — Z79899 Other long term (current) drug therapy: Secondary | ICD-10-CM | POA: Diagnosis not present

## 2020-10-18 DIAGNOSIS — Z7901 Long term (current) use of anticoagulants: Secondary | ICD-10-CM | POA: Diagnosis not present

## 2020-10-18 DIAGNOSIS — Z950 Presence of cardiac pacemaker: Secondary | ICD-10-CM | POA: Diagnosis not present

## 2020-10-18 DIAGNOSIS — K449 Diaphragmatic hernia without obstruction or gangrene: Secondary | ICD-10-CM | POA: Diagnosis not present

## 2020-10-18 DIAGNOSIS — R0689 Other abnormalities of breathing: Secondary | ICD-10-CM | POA: Diagnosis not present

## 2020-10-18 DIAGNOSIS — I2510 Atherosclerotic heart disease of native coronary artery without angina pectoris: Secondary | ICD-10-CM | POA: Insufficient documentation

## 2020-10-18 DIAGNOSIS — I471 Supraventricular tachycardia: Secondary | ICD-10-CM | POA: Diagnosis not present

## 2020-10-18 DIAGNOSIS — R0602 Shortness of breath: Secondary | ICD-10-CM | POA: Diagnosis not present

## 2020-10-18 DIAGNOSIS — R002 Palpitations: Secondary | ICD-10-CM | POA: Diagnosis not present

## 2020-10-18 DIAGNOSIS — R42 Dizziness and giddiness: Secondary | ICD-10-CM | POA: Diagnosis not present

## 2020-10-18 LAB — CBC
HCT: 31.3 % — ABNORMAL LOW (ref 36.0–46.0)
Hemoglobin: 10.2 g/dL — ABNORMAL LOW (ref 12.0–15.0)
MCH: 32.9 pg (ref 26.0–34.0)
MCHC: 32.6 g/dL (ref 30.0–36.0)
MCV: 101 fL — ABNORMAL HIGH (ref 80.0–100.0)
Platelets: 216 10*3/uL (ref 150–400)
RBC: 3.1 MIL/uL — ABNORMAL LOW (ref 3.87–5.11)
RDW: 13.5 % (ref 11.5–15.5)
WBC: 5.1 10*3/uL (ref 4.0–10.5)
nRBC: 0 % (ref 0.0–0.2)

## 2020-10-18 LAB — MAGNESIUM: Magnesium: 2.1 mg/dL (ref 1.7–2.4)

## 2020-10-18 LAB — BASIC METABOLIC PANEL
Anion gap: 9 (ref 5–15)
BUN: 14 mg/dL (ref 8–23)
CO2: 24 mmol/L (ref 22–32)
Calcium: 9 mg/dL (ref 8.9–10.3)
Chloride: 107 mmol/L (ref 98–111)
Creatinine, Ser: 0.89 mg/dL (ref 0.44–1.00)
GFR, Estimated: >60 mL/min (ref >60)
Glucose, Bld: 113 mg/dL — ABNORMAL HIGH (ref 70–99)
Potassium: 3.9 mmol/L (ref 3.5–5.1)
Sodium: 140 mmol/L (ref 135–145)

## 2020-10-18 LAB — TROPONIN I (HIGH SENSITIVITY)
Troponin I (High Sensitivity): 25 ng/L — ABNORMAL HIGH (ref <18)
Troponin I (High Sensitivity): 232 ng/L (ref <18)

## 2020-10-18 MED ORDER — SODIUM CHLORIDE 0.9 % IV BOLUS
500.0000 mL | Freq: Once | INTRAVENOUS | Status: AC
  Administered 2020-10-18: 500 mL via INTRAVENOUS

## 2020-10-18 MED ORDER — DILTIAZEM LOAD VIA INFUSION
20.0000 mg | Freq: Once | INTRAVENOUS | Status: AC
  Administered 2020-10-18: 20 mg via INTRAVENOUS
  Filled 2020-10-18: qty 20

## 2020-10-18 MED ORDER — DILTIAZEM HCL ER 120 MG PO CP24: 120.0000 mg | ORAL_CAPSULE | Freq: Every day | ORAL | 0 refills | Status: DC

## 2020-10-18 MED ORDER — DILTIAZEM HCL-DEXTROSE 125-5 MG/125ML-% IV SOLN (PREMIX)
5.0000 mg/h | INTRAVENOUS | Status: DC
  Administered 2020-10-18: 5 mg/h via INTRAVENOUS
  Filled 2020-10-18: qty 125

## 2020-10-18 NOTE — ED Notes (Signed)
Pt here via ems with reports of making dinner this evening and started to feel weakness and husband noted her heart rate was fast. Pt arrives with elevated heart rate, pt placed on cardiac monitor, EKG obtained. Dr. In room for exam

## 2020-10-18 NOTE — ED Provider Notes (Signed)
[facility] EMERGENCY DEPARTMENT Provider Note   CSN: [id number] Arrival date & time: 10/18/20  1812     History Chief Complaint  Patient presents with   Tachycardia    Alexandria French is a 70 y.o. female.  Pt presents to the ED today with elevated HR.  Pt said she was making dinner this evening and suddenly did not feel well.  Her husband noted that her HR was fast, so he brought her here.  Pt said she had another episode today when she did not feel well and had to sit down, but it passed.  She does have a hx of afib and has a hx of SSS requiring a pacemaker [employer]).      Past Medical History:  Diagnosis Date   A-fib [facility])    CAD (coronary artery disease)    Diverticulosis    Esophageal reflux    no meds   External hemorrhoids    Gastritis    Hypertension    IDA (iron deficiency anemia)    Internal hemorrhoids    Other and unspecified hyperlipidemia    SSS (sick sinus syndrome) [facility])     Patient Active Problem List   Diagnosis Date Noted   SSS (sick sinus syndrome) ([place]) 02/28/2020   Encounter for screening colonoscopy 08/13/2019   Palpitations 06/07/2014   HTN (hypertension) 01/18/2014   Chest pain 08/13/2013   LBBB (left bundle branch block) 08/13/2013    Past Surgical History:  Procedure Laterality Date   ABDOMINAL HYSTERECTOMY  08/2008   BLADDER REPAIR     PACEMAKER IMPLANT N/A 02/28/2020   Procedure: PACEMAKER IMPLANT;  Surgeon: Sanda Klein, MD;  Location: [facility];  Service: Cardiovascular;  Laterality: N/A;   TONSILLECTOMY       OB History   No obstetric history on file.     Family History  Problem Relation Age of Onset   Heart disease Other    Osteoporosis Other    Arthritis Other    Colon cancer Neg Hx    Colon polyps Neg Hx     Social History   Tobacco Use   Smoking status: Never   Smokeless tobacco: Never  Substance Use Topics   Alcohol use: No    Alcohol/week: 0.0 standard drinks   Drug use: No     Home Medications Prior to Admission medications   Medication Sig Start Date End Date Taking? Authorizing Provider  bisoprolol (ZEBETA) 10 MG tablet Take 10 mg every morning and (1/2 tablet) 5 mg every afternoon Patient taking differently: Take by mouth daily. Take 10 mg every morning and (1/2 tablet) 5 mg every evening 07/04/20  Yes Croitoru, Mihai, MD  bisoprolol (ZEBETA) 5 MG tablet Take 5 mg by mouth at bedtime. Take 10 mg in the morning and 5 mg every evening 07/28/20  Yes [provider]  Calcium Carbonate-Vitamin D 600-400 MG-UNIT tablet Take 1 tablet by mouth 2 (two) times daily.    Yes [provider]  cetirizine (ZYRTEC) 10 MG tablet Take 10 mg by mouth every evening.    Yes [provider]  Coenzyme Q10 (COQ-10) 100 MG CAPS Take 100 mg by mouth daily.    Yes [provider]  diltiazem (DILACOR XR) 120 MG 24 hr capsule Take 1 capsule (120 mg total) by mouth daily. 10/18/20  Yes Isla Pence, MD  ELIQUIS 5 MG TABS tablet TAKE 1 TABLET BY MOUTH TWICE A DAY 07/04/20  Yes Croitoru, Dani Gobble, MD  ferrous sulfate  325 (65 FE) MG EC tablet Take 325 mg by mouth in the morning and at bedtime.   Yes [provider]  fluticasone (FLONASE) 50 MCG/ACT nasal spray Place 2 sprays into both nostrils daily as needed (allergies.).    Yes [provider]  LORazepam (ATIVAN) 1 MG tablet Take 0.5-1 mg by mouth at bedtime as needed for anxiety or sleep.    Yes [provider]  simvastatin (ZOCOR) 20 MG tablet Take 20 mg by mouth daily.   Yes [provider]  vitamin C (ASCORBIC ACID) 500 MG tablet Take 500 mg by mouth daily.   Yes [provider]  acetaminophen (TYLENOL) 500 MG tablet Take 500-1,000 mg by mouth 2 (two) times daily as needed (back pain.). Patient not taking: Reported on 10/18/2020    [provider]    Allergies    Cefzil [cefprozil]  Review of Systems   Review of Systems  Cardiovascular:  Positive for  palpitations.  All other systems reviewed and are negative.  Physical Exam Updated Vital Signs BP 136/63   Pulse (!) 59   Temp 97.7 F (36.5 C) (Oral)   Resp 18   Ht 5\' 5"  (1.651 m)   Wt 63.5 kg   SpO2 96%   BMI 23.30 kg/m   Physical Exam Vitals and nursing note reviewed.  Constitutional:      Appearance: Normal appearance. She is ill-appearing.  HENT:     Head: Normocephalic and atraumatic.     Right Ear: External ear normal.     Left Ear: External ear normal.     Nose: Nose normal.     Mouth/Throat:     Mouth: Mucous membranes are dry.  Eyes:     Extraocular Movements: Extraocular movements intact.     Conjunctiva/sclera: Conjunctivae normal.     Pupils: Pupils are equal, round, and reactive to light.  Cardiovascular:     Rate and Rhythm: Tachycardia present. Rhythm irregular.     Pulses: Normal pulses.     Heart sounds: Normal heart sounds.  Pulmonary:     Effort: Pulmonary effort is normal.     Breath sounds: Normal breath sounds.  Abdominal:     General: Abdomen is flat. Bowel sounds are normal.     Palpations: Abdomen is soft.  Musculoskeletal:        General: Normal range of motion.     Cervical back: Normal range of motion and neck supple.  Skin:    General: Skin is warm.     Capillary Refill: Capillary refill takes less than 2 seconds.  Neurological:     General: No focal deficit present.     Mental Status: She is alert and oriented to person, place, and time.  Psychiatric:        Mood and Affect: Mood normal.        Behavior: Behavior normal.    ED Results / Procedures / Treatments   Labs (all labs ordered are listed, but only abnormal results are displayed) Labs Reviewed  BASIC METABOLIC PANEL - Abnormal; Notable for the following components:      Result Value   Glucose, Bld 113 (*)    All other components within normal limits  CBC - Abnormal; Notable for the following components:   RBC 3.10 (*)    Hemoglobin 10.2 (*)    HCT 31.3 (*)     MCV 101.0 (*)    All other components within normal limits  TROPONIN I (HIGH SENSITIVITY) -  Abnormal; Notable for the following components:   Troponin I (High Sensitivity) 25 (*)    All other components within normal limits  TROPONIN I (HIGH SENSITIVITY) - Abnormal; Notable for the following components:   Troponin I (High Sensitivity) 232 (*)    All other components within normal limits  MAGNESIUM    EKG EKG Interpretation  Date/Time:  Wednesday October 18 2020 18:36:25 EDT Ventricular Rate:  68 PR Interval:  189 QRS Duration: 124 QT Interval:  443 QTC Calculation: 472 R Axis:   -49 Text Interpretation: Sinus rhythm Atrial premature complex Left bundle branch block HR much better s/p Cardizem Confirmed by Isla Pence [id number]) on 10/18/2020 6:46:46 PM      Radiology DG Chest Port 1 View  Result Date: 10/18/2020 CLINICAL DATA:  Palpitations, shortness of breath EXAM: PORTABLE CHEST 1 VIEW COMPARISON:  02/28/2020 FINDINGS: Single frontal view of the chest demonstrates stable dual lead pacer. Cardiac silhouette is unremarkable. Small hiatal hernia. Chronic scarring throughout the lungs without airspace disease, effusion, or pneumothorax. No acute bony abnormality. IMPRESSION: 1. No acute intrathoracic process. 2. Small hiatal hernia. Electronically Signed   By: Randa Ngo M.D.   On: 10/18/2020 19:20    Procedures Procedures   Medications Ordered in ED Medications  diltiazem (CARDIZEM) 1 mg/mL load via infusion 20 mg (20 mg Intravenous Bolus from Bag 10/18/20 1829)    And  diltiazem (CARDIZEM) 125 mg in dextrose 5% 125 mL (1 mg/mL) infusion (0 mg/hr Intravenous Stopped 10/18/20 1844)  sodium chloride 0.9 % bolus 500 mL (0 mLs Intravenous Stopped 10/18/20 1921)    ED Course  I have reviewed the triage vital signs and the nursing notes.  Pertinent labs & imaging results that were available during my care of the patient were reviewed by me and considered in my medical decision  making (see chart for details).    MDM Rules/Calculators/A&P                           CHA2DS2/VAS Stroke Risk Points  Current as of 4 minutes ago     4 >= 2 Points: High Risk  1 - 1.99 Points: Medium Risk  0 Points: Low Risk    Last Change: N/A      Details    This score determines the patient's risk of having a stroke if the  patient has atrial fibrillation.       Points Metrics  0 Has Congestive Heart Failure:  No    Current as of 4 minutes ago  1 Has Vascular Disease:  Yes    Current as of 4 minutes ago  1 Has Hypertension:  Yes    Current as of 4 minutes ago  1 Age:  52    Current as of 4 minutes ago  0 Has Diabetes:  No    Current as of 4 minutes ago  0 Had Stroke:  No  Had TIA:  No  Had Thromboembolism:  No    Current as of 4 minutes ago  1 Female:  Yes    Current as of 4 minutes ago     Pt is on Eliquis.  Afib with RVR broke with the cardizem bolus.  She is now at a HR in the 60s.  She feels much better.    Results of interrogation d/w the [employer].  She thinks the VT is actually AT.    The pt d/w Dr. Blossom Hoops (cards).  Elevated trop likely from strain from HR.  Pt has not had any cp.  She wants to go home.  We are going to send her home.  She has strict instructions to return if worse.  Dr. Blossom Hoops will arrange for pt go get follow up.  He requests pt start on Diltiazem XR 120 mg.  CRITICAL CARE Performed by: Isla Pence   Total critical care time: 30 minutes  Critical care time was exclusive of separately billable procedures and treating other patients.  Critical care was necessary to treat or prevent imminent or life-threatening deterioration.  Critical care was time spent personally by me on the following activities: development of treatment plan with patient and/or surrogate as well as nursing, discussions with consultants, evaluation of patient's response to treatment, examination of patient, obtaining history from patient or surrogate,  ordering and performing treatments and interventions, ordering and review of laboratory studies, ordering and review of radiographic studies, pulse oximetry and re-evaluation of patient's condition.     Final Clinical Impression(s) / ED Diagnoses Final diagnoses:  Atrial fibrillation with rapid ventricular response ([place])    Rx / DC Orders ED Discharge Orders          Ordered    diltiazem (DILACOR XR) 120 MG 24 hr capsule  Daily        10/18/20 2200             Isla Pence, MD 10/18/20 2214

## 2020-10-18 NOTE — ED Notes (Addendum)
Date and time results received: 10/18/20 9:36 PM  Test: Troponin Critical Value: 232  Name of Provider Notified: Dr. Gilford Raid   Orders Received? Or Actions Taken?: Acknowledged

## 2020-10-18 NOTE — ED Notes (Signed)
Patient ambulated to restroom with standby assist.  

## 2020-10-19 ENCOUNTER — Telehealth: Payer: Self-pay | Admitting: Cardiovascular Disease

## 2020-10-19 NOTE — Progress Notes (Signed)
Office Visit    Patient Name: Alexandria French Date of Encounter: 10/20/2020  PCP:   Nation, MD   [place]  Cardiologist:  Sanda Klein, MD  Advanced Practice Provider:  No care team member to display Electrophysiologist:  None    Chief Complaint    Alexandria French is a 70 y.o. female with a hx of PAF, LBBB, HOCM, MR, sinus bradycardia, coronary calcium score without significant stenosis by CT 12/2018, SSS s/p PPM, PAF, HLD, anemia presents today for ED follow up   Past Medical History    Past Medical History:  Diagnosis Date   A-fib ([place])    CAD (coronary artery disease)    Diverticulosis    Esophageal reflux    no meds   External hemorrhoids    Gastritis    Hypertension    IDA (iron deficiency anemia)    Internal hemorrhoids    Other and unspecified hyperlipidemia    SSS (sick sinus syndrome) ([place])    Past Surgical History:  Procedure Laterality Date   ABDOMINAL HYSTERECTOMY  08/2008   BLADDER REPAIR     PACEMAKER IMPLANT N/A 02/28/2020   Procedure: PACEMAKER IMPLANT;  Surgeon: Sanda Klein, MD;  Location: [facility];  Service: Cardiovascular;  Laterality: N/A;   TONSILLECTOMY      Allergies  Allergies  Allergen Reactions   Cefzil [Cefprozil] Other (See Comments)    angioedema    History of Present Illness    Alexandria French is a 70 y.o. female with a hx of PAF, LBBB, HOCM, MR, sinus bradycardia, coronary calcium score without significant stenosis by CT 12/2018, SSS s/p PPM, PAF, HLD, anemia  last seen 06/12/20 by Dr. Sallyanne Kuster.  Previous cardiac CTA with calcium score in the 85th percentile though no major coronary stenosis, coronary system is left cominant. She has mild bilateral carotid plaque <40%.   Most recent echocardiogram 07/2019 with LVOT peak gradient 213 mmHg, LVEF hyperdynamic 70-75%, mild concentric LVH, gr1DD, no RWMA, normal PASP, RV normal size and function trivial MR, mild AI.   Previous long term  monitor 10/2019 with mild sinus bradycardia and blunted circadial rhythm variation with average heart rate 55 bpm. Maximum heart rate in sinus was only 93 bpm. She had frequent PVC representing 6% of all QRS complex. She had rare brief episodes of atrial tachycardia. Findings suggestive of sinus node dysfunction. She subsequently had PPM implanted by Dr. Sallyanne Kuster 02/28/20.   She previously had hypotension and medications were stopped. At follow up 06/12/20 with Dr. Sallyanne Kuster BP was markely elevated 218/88 on presentation and 175/78 after resting. She was started on Bisoprolol.  She presents today for follow up with her husband. She took one dose of Diltiazem yesterday and tolerated well.  Reports no recurrent palpitations or fast heart rates. She has been drinking around five or six 16 oz bottles of water per day. She drinks a cup of decaffeinated coffee once  every three days.  She takes Benadryl every other day for sleep if she is not taking her Ativan. She does not notice palpitations when taking that medications.  We did discuss that Benadryl could be a proarrhythmic medication. Her BP at home has been 130s/50s. Her heart rate since discharge has been 60-70bpm. She had her blood pressure cuff calibrated several years ago. She does have significant hemmorhoid internal and external causing some rectal bleeding. She has an appointment Monday to see Dr. Hilarie Fredrickson to discuss colonoscopy.   EKGs/Labs/Other Studies Reviewed:  The following studies were reviewed today: Echocardiogram 08/27/2019   1. LVOT peak gradient 213 mmHg. Left ventricular ejection fraction, by estimation, is 70 to 75%. The left ventricle has hyperdynamic function. The left ventricle has no regional wall motion abnormalities. There is mild concentric left ventricular hypertrophy. Left ventricular diastolic parameters are consistent with Grade I diastolic dysfunction (impaired relaxation). Elevated left atrial pressure. 2. Right  ventricular systolic function is normal. The right ventricular size is normal. There is normal pulmonary artery systolic pressure. 3. The mitral valve is grossly normal. Trivial mitral valve regurgitation. 4. The aortic valve is tricuspid. Aortic valve regurgitation is mild. No aortic stenosis is present. 5. The inferior vena cava is normal in size with greater than 50% respiratory variability, suggesting right atrial pressure of 3 mmHg.     Echocardiogram 11/26/2018:   1. The left ventricle has a visually estimated ejection fraction of 75%.  The cavity size was normal. Left ventricular diastolic Doppler parameters  are consistent with impaired relaxation. Elevated left ventricular  end-diastolic pressure No evidence of  left ventricular regional wall motion abnormalities.   2. The right ventricle has normal systolic function. The cavity was  normal. There is no increase in right ventricular wall thickness.   3. The aortic valve is tricuspid. Mild thickening of the aortic valve.  Aortic valve regurgitation is mild by color flow Doppler. Mild aortic  annular calcification noted.   4. The mitral valve is abnormal. Moderate thickening of the mitral valve  leaflet. Mitral valve regurgitation is moderate by color flow Doppler.   5. Mitral chordal SAM is noted. LVOT is small. Peak gradient 145 mmHg.   6. The tricuspid valve is grossly normal. Tricuspid valve regurgitation  is moderate.   7. The aorta is normal unless otherwise noted.      Coronary CT angiography 12/03/2018:  FINDINGS: Coronary calcium score: The patient's coronary artery calcium score is 149, which places the patient in the 85th percentile. Coronary arteries: Normal coronary origins.  Left dominance Right Coronary Artery: Small, non-dominant vessel which gives off a larger AV nodal branch. No obstructive disease. Left Main Coronary Artery: Prominent left coronary cusp, but not anuerymsal. No stenosis. Gives off larger LAD and  LCx arteries. Left Anterior Descending Coronary Artery: Calcified proximal vessel with mild 25-49% (CADRADS2) mixed stenosis. Gives off several small diagonal branches. Ramus Intermedius Artery: Small branch without significant stenosis.   Left Circumflex Artery: Dominant vessel that gives off a small PDA artery. Larger caliber with minimal mixed 1-24% mid-vessel stenosis (CADRADS1). Aorta: Normal size, 28 mm at the mid ascending aorta (level of the PA bifurcation) measured double oblique. Atherosclerosis. No dissection. Aortic Valve: Trileaflet.  Calcification of the annulus.   Other findings: Thickening of the aortic and mitral valve leaflets. Normal biatrial size Normal pulmonary vein drainage into the left atrium. Normal left atrial appendage without a thrombus. Normal size of the pulmonary artery.   IMPRESSION: 1. Mild, non-obstructive CAD primarily of the LAD, CADRADS = 2. 2. Coronary calcium score of 149. This was 85th percentile for age and sex matched control. 3. Normal coronary origin with left dominance. 4. Thickening of the aortic and mitral valve leaflets   MCOT monitor 07/14/2017 Sinus rhythm with frequent PVCs. There was one episode of rapid atrial fibrillation.    EKG:  EKG is  ordered today.  The ekg ordered today demonstrates AVdual paced rhythm 66 bpm.   Recent Labs: 10/18/2020: BUN 14; Creatinine, Ser 0.89; Hemoglobin 10.2; Magnesium 2.1; Platelets 216;  Potassium 3.9; Sodium 140  Recent Lipid Panel No results found for: CHOL, TRIG, HDL, CHOLHDL, VLDL, LDLCALC, LDLDIRECT  Home Medications   Current Meds  Medication Sig   acetaminophen (TYLENOL) 500 MG tablet Take 500-1,000 mg by mouth 2 (two) times daily as needed (back pain.).   bisoprolol (ZEBETA) 10 MG tablet Take 10 mg every morning and (1/2 tablet) 5 mg every afternoon (Patient taking differently: Take by mouth daily. Take 10 mg every morning and (1/2 tablet) 5 mg every evening)   bisoprolol (ZEBETA) 5  MG tablet Take 5 mg by mouth at bedtime. Take 10 mg in the morning and 5 mg every evening   Calcium Carbonate-Vitamin D 600-400 MG-UNIT tablet Take 1 tablet by mouth 2 (two) times daily.    cetirizine (ZYRTEC) 10 MG tablet Take 10 mg by mouth every evening.    Coenzyme Q10 (COQ-10) 100 MG CAPS Take 100 mg by mouth daily.    ELIQUIS 5 MG TABS tablet TAKE 1 TABLET BY MOUTH TWICE A DAY   ferrous sulfate 325 (65 FE) MG EC tablet Take 325 mg by mouth in the morning and at bedtime.   fluticasone (FLONASE) 50 MCG/ACT nasal spray Place 2 sprays into both nostrils daily as needed (allergies.).    LORazepam (ATIVAN) 1 MG tablet Take 0.5-1 mg by mouth at bedtime as needed for anxiety or sleep.    simvastatin (ZOCOR) 20 MG tablet Take 20 mg by mouth daily.   vitamin C (ASCORBIC ACID) 500 MG tablet Take 500 mg by mouth daily.   [DISCONTINUED] diltiazem (DILACOR XR) 120 MG 24 hr capsule Take 1 capsule (120 mg total) by mouth daily.   Current Facility-Administered Medications for the 10/20/20 encounter (Office Visit) with Loel Dubonnet, NP  Medication   sodium chloride flush (NS) 0.9 % injection 3 mL     Review of Systems     All other systems reviewed and are otherwise negative except as noted above.  Physical Exam    VS:  BP (!) 134/92   Pulse 66   Ht 5\' 5"  (1.651 m)   Wt 139 lb (63 kg)   BMI 23.13 kg/m  , BMI Body mass index is 23.13 kg/m.  Wt Readings from Last 3 Encounters:  10/20/20 139 lb (63 kg)  10/18/20 140 lb (63.5 kg)  06/12/20 139 lb 3.2 oz (63.1 kg)     GEN: Well nourished, well developed, in no acute distress. HEENT: normal. Neck: Supple, no JVD, carotid bruits, or masses. Cardiac: RRR, no murmurs, rubs, or gallops. No clubbing, cyanosis, edema.  Radials/PT 2+ and equal bilaterally.  Respiratory:  Respirations regular and unlabored, clear to auscultation bilaterally. GI: Soft, nontender, nondistended. MS: No deformity or atrophy. Skin: Warm and dry, no rash. Neuro:   Strength and sensation are intact. Psych: Normal affect.  Assessment & Plan    HOCM -no recent near-syncope nor syncope.  She is staying well-hydrated and we discussed the importance of this.  Continue present bisoprolol 5 mg in the morning and 10 mg in the evening with diltiazem 120 mg daily.  Refill of diltiazem provided.  SSS s/p PPM -continue to follow with Dr. Sallyanne Kuster.  PPM functioning appropriately by EKG today.  Upcoming device check 10/2020.  HTN - BP well controlled. Continue current antihypertensive regimen.    PAF / Chronic anticoagulation -maintaining normal sinus rhythm by EKG today with EKG showing AV paced rhythm.  No recurrent palpitations or tachycardia since recent ED visit at which time she  was started on diltiazem 120 mg daily.  We will continue diltiazem 120 mg daily for prevention of atrial fibrillation as well as GDMT for HOCM.  Continue present dose of bisoprolol 5 mg in the morning and 10 mg in the evening.  Care discussed with Dr. Sallyanne Kuster  Nonobstructive CAD / HLD, LDL goal <70 - Stable with no anginal symptoms. No indication for ischemic evaluation.  GDMT includes beta-blocker, statin.  No aspirin due to chronic anticoagulation. Heart healthy diet and regular cardiovascular exercise encouraged.    Disposition: Follow up  in September with Laurann Montana, NP and in November  with Dr. Sallyanne Kuster Signed, Loel Dubonnet, NP 10/20/2020, 8:56 PM [place]

## 2020-10-19 NOTE — Telephone Encounter (Signed)
Spoke with pt. She report she was seen in the ER yesterday due to Afib episode and advised to contact office for a 2 day follow up.   Appointment scheduled for 7/22 with Brunetta Genera, NP

## 2020-10-19 NOTE — Telephone Encounter (Signed)
   Pt's spouse calling, he said pt had an episode yesterday it was so bad he called an ambulance they took her at [facility] hospital, pt was in Afib and they recommend to make an appt with Dr. Loletha Grayer in 2 days

## 2020-10-20 ENCOUNTER — Encounter (HOSPITAL_BASED_OUTPATIENT_CLINIC_OR_DEPARTMENT_OTHER): Payer: Self-pay | Admitting: Family

## 2020-10-20 ENCOUNTER — Other Ambulatory Visit: Payer: Self-pay

## 2020-10-20 ENCOUNTER — Ambulatory Visit (HOSPITAL_BASED_OUTPATIENT_CLINIC_OR_DEPARTMENT_OTHER): Payer: Medicare PPO | Admitting: Family

## 2020-10-20 VITALS — BP 134/92 | HR 66 | Ht 65.0 in | Wt 139.0 lb

## 2020-10-20 DIAGNOSIS — I495 Sick sinus syndrome: Secondary | ICD-10-CM | POA: Diagnosis not present

## 2020-10-20 DIAGNOSIS — Z7901 Long term (current) use of anticoagulants: Secondary | ICD-10-CM | POA: Diagnosis not present

## 2020-10-20 DIAGNOSIS — I2510 Atherosclerotic heart disease of native coronary artery without angina pectoris: Secondary | ICD-10-CM | POA: Diagnosis not present

## 2020-10-20 DIAGNOSIS — I421 Obstructive hypertrophic cardiomyopathy: Secondary | ICD-10-CM

## 2020-10-20 DIAGNOSIS — Z950 Presence of cardiac pacemaker: Secondary | ICD-10-CM | POA: Diagnosis not present

## 2020-10-20 DIAGNOSIS — I480 Paroxysmal atrial fibrillation: Secondary | ICD-10-CM | POA: Diagnosis not present

## 2020-10-20 DIAGNOSIS — I10 Essential (primary) hypertension: Secondary | ICD-10-CM

## 2020-10-20 MED ORDER — DILTIAZEM HCL ER 120 MG PO CP24: 120.0000 mg | ORAL_CAPSULE | Freq: Every day | ORAL | 11 refills | Status: DC

## 2020-10-20 NOTE — Patient Instructions (Signed)
Medication Instructions:   Continue your current medications.  We will refill your Diltiazem '120mg'$  daily.   *If you need a refill on your cardiac medications before your next appointment, please call your pharmacy*   Lab Work: None ordered today.   If you have labs (blood work) drawn today and your tests are completely normal, you will receive your results only by: [place] (if you have MyChart) OR A paper copy in the mail If you have any lab test that is abnormal or we need to change your treatment, we will call you to review the results.   Testing/Procedures: Your EKG today showed your pacemaker was working well.    Follow-Up: At [facility], you and your health needs are our priority.  As part of our continuing mission to provide you with exceptional heart care, we have created designated Provider Care Teams.  These Care Teams include your primary Cardiologist (physician) and Advanced Practice Providers (APPs -  Physician Assistants and Nurse Practitioners) who all work together to provide you with the care you need, when you need it.  We recommend signing up for the patient portal called "MyChart".  Sign up information is provided on this After Visit Summary.  MyChart is used to connect with patients for Virtual Visits (Telemedicine).  Patients are able to view lab/test results, encounter notes, upcoming appointments, etc.  Non-urgent messages can be sent to your provider as well.   To learn more about what you can do with MyChart, go to [email or web address].    Your next appointment:   In September  The format for your next appointment:   In Person  Provider:   Laurann Montana, NP   Other Instructions  Heart Healthy Diet Recommendations: A low-salt diet is recommended. Meats should be grilled, baked, or boiled. Avoid fried foods. Focus on lean protein sources like fish or chicken with vegetables and fruits. The American Heart Association is a Microbiologist!   American Heart Association Diet and Lifeystyle Recommendations   Exercise recommendations: The American Heart Association recommends 150 minutes of moderate intensity exercise weekly. Try 30 minutes of moderate intensity exercise 4-5 times per week. This could include walking, jogging, or swimming.  To prevent or reduce lower extremity swelling: Eat a low salt diet. Salt makes the body hold onto extra fluid which causes swelling. Sit with legs elevated. For example, in the recliner or on an [place].  Wear knee-high compression stockings during the daytime. Ones labeled 15-20 mmHg provide good compression.

## 2020-10-23 ENCOUNTER — Telehealth: Payer: Self-pay | Admitting: *Deleted

## 2020-10-23 ENCOUNTER — Other Ambulatory Visit (INDEPENDENT_AMBULATORY_CARE_PROVIDER_SITE_OTHER): Payer: Medicare PPO

## 2020-10-23 ENCOUNTER — Encounter: Payer: Self-pay | Admitting: Internal Medicine

## 2020-10-23 ENCOUNTER — Ambulatory Visit: Payer: Medicare PPO | Admitting: Internal Medicine

## 2020-10-23 VITALS — BP 158/76 | HR 80 | Ht 65.0 in | Wt 139.0 lb

## 2020-10-23 DIAGNOSIS — Z7901 Long term (current) use of anticoagulants: Secondary | ICD-10-CM | POA: Diagnosis not present

## 2020-10-23 DIAGNOSIS — R195 Other fecal abnormalities: Secondary | ICD-10-CM

## 2020-10-23 DIAGNOSIS — K649 Unspecified hemorrhoids: Secondary | ICD-10-CM | POA: Diagnosis not present

## 2020-10-23 DIAGNOSIS — D509 Iron deficiency anemia, unspecified: Secondary | ICD-10-CM

## 2020-10-23 LAB — CBC WITH DIFFERENTIAL/PLATELET
Basophils Absolute: 0.1 10*3/uL (ref 0.0–0.1)
Basophils Relative: 1.1 % (ref 0.0–3.0)
Eosinophils Absolute: 0.2 10*3/uL (ref 0.0–0.7)
Eosinophils Relative: 3.5 % (ref 0.0–5.0)
HCT: 30.4 % — ABNORMAL LOW (ref 36.0–46.0)
Hemoglobin: 10.1 g/dL — ABNORMAL LOW (ref 12.0–15.0)
Lymphocytes Relative: 23.8 % (ref 12.0–46.0)
Lymphs Abs: 1 10*3/uL (ref 0.7–4.0)
MCHC: 33.3 g/dL (ref 30.0–36.0)
MCV: 96.2 fl (ref 78.0–100.0)
Monocytes Absolute: 0.3 10*3/uL (ref 0.1–1.0)
Monocytes Relative: 8 % (ref 3.0–12.0)
Neutro Abs: 2.8 10*3/uL (ref 1.4–7.7)
Neutrophils Relative %: 63.6 % (ref 43.0–77.0)
Platelets: 239 10*3/uL (ref 150.0–400.0)
RBC: 3.16 Mil/uL — ABNORMAL LOW (ref 3.87–5.11)
RDW: 13.7 % (ref 11.5–15.5)
WBC: 4.4 10*3/uL (ref 4.0–10.5)

## 2020-10-23 LAB — IBC + FERRITIN
Ferritin: 20.1 ng/mL (ref 10.0–291.0)
Iron: 325 ug/dL — ABNORMAL HIGH (ref 42–145)
Saturation Ratios: 67.7 % — ABNORMAL HIGH (ref 20.0–50.0)
Transferrin: 343 mg/dL (ref 212.0–360.0)

## 2020-10-23 MED ORDER — SUTAB 1479-225-188 MG PO TABS: ORAL_TABLET | ORAL | 0 refills | Status: DC

## 2020-10-23 NOTE — Telephone Encounter (Signed)
Request for surgical clearance:     Endoscopy Procedure  What type of surgery is being performed?     colonoscopy  When is this surgery scheduled?     12/21/20  What type of clearance is required ?   Pharmacy  Are there any medications that need to be held prior to surgery and how long? Eliquis, 2 days  Practice name and name of physician performing surgery?      [facility]  What is your office phone and fax number?      Phone- [phone]  [phone]  Anesthesia type (None, local, MAC, general) ?       MAC

## 2020-10-23 NOTE — Telephone Encounter (Signed)
  Message routed to PharmD for anticoag clearance.  Rosaria Ferries, PA-C 10/23/2020 4:07 PM

## 2020-10-23 NOTE — Progress Notes (Signed)
[name], female   DOB: 26-Aug-1950, 70 y.o.   MRN: [id number] HPI: Alexandria French is a 70 year old female with a history of iron deficiency anemia, symptomatic hemorrhoids, mild gastritis not related H. pylori, diverticulosis, atrial fibrillation on Eliquis, hypertrophic cardiomyopathy wit hyperdynamic EF who is seen to discuss colonoscopy to complete evaluation for IDA.  She is here today with her husband.  She had an evaluation in [place] with [facility] to evaluate iron deficiency anemia, heme positive stool and rectal bleeding in October 2021.  EGD revealed scattered mild inflammation in the prepyloric stomach which was biopsied.  CLOtest was negative.  There was a 2 cm hiatal hernia.  The exam was otherwise normal. Colonoscopy performed for the same indication revealed internal and external hemorrhoids which were moderate to large.  Multiple diverticula in the sigmoid, descending colon and splenic flexure.  Question of a small polyp versus mucosal fold but not biopsied as the procedure was terminated due to bradycardia.  She is subsequently underwent pacemaker placement and has followed up with cardiology.  She reports that she has remained on oral iron twice daily.  She has 1-2 bowel movements per day which are dark but not overtly melenic without blood in stool.  She does have blood with wiping which she has attributed to her hemorrhoids.  No abdominal pain.  Stable weight.  Good appetite.  No dysphagia, odynophagia or frequent heartburn.  Past Medical History:  Diagnosis Date   A-fib [facility])    CAD (coronary artery disease)    Diverticulosis    Esophageal reflux    no meds   External hemorrhoids    Gastritis    Hypertension    IDA (iron deficiency anemia)    Internal hemorrhoids    Other and unspecified hyperlipidemia    SSS (sick sinus syndrome) ([place])     Past Surgical History:  Procedure Laterality Date   ABDOMINAL HYSTERECTOMY  08/2008   BLADDER REPAIR      PACEMAKER IMPLANT N/A 02/28/2020   Procedure: PACEMAKER IMPLANT;  Surgeon: Sanda Klein, MD;  Location: [facility];  Service: Cardiovascular;  Laterality: N/A;   TONSILLECTOMY      Outpatient Medications Prior to Visit  Medication Sig Dispense Refill   acetaminophen (TYLENOL) 500 MG tablet Take 500-1,000 mg by mouth 2 (two) times daily as needed (back pain.).     bisoprolol (ZEBETA) 10 MG tablet Take 10 mg every morning and (1/2 tablet) 5 mg every afternoon (Patient taking differently: Take by mouth daily. Take 10 mg every morning and (1/2 tablet) 5 mg every evening) 135 tablet 3   bisoprolol (ZEBETA) 5 MG tablet Take 5 mg by mouth at bedtime. Take 10 mg in the morning and 5 mg every evening     Calcium Carbonate-Vitamin D 600-400 MG-UNIT tablet Take 1 tablet by mouth 2 (two) times daily.      cetirizine (ZYRTEC) 10 MG tablet Take 10 mg by mouth every evening.      Coenzyme Q10 (COQ-10) 100 MG CAPS Take 100 mg by mouth daily.      diltiazem (DILACOR XR) 120 MG 24 hr capsule Take 1 capsule (120 mg total) by mouth daily. 30 capsule 11   ELIQUIS 5 MG TABS tablet TAKE 1 TABLET BY MOUTH TWICE A DAY 180 tablet 1   ferrous sulfate 325 (65 FE) MG EC tablet Take 325 mg by mouth in the morning and at bedtime.     fluticasone (FLONASE) 50 MCG/ACT nasal spray Place  2 sprays into both nostrils daily as needed (allergies.).      LORazepam (ATIVAN) 1 MG tablet Take 0.5-1 mg by mouth at bedtime as needed for anxiety or sleep.      simvastatin (ZOCOR) 20 MG tablet Take 20 mg by mouth daily.     vitamin C (ASCORBIC ACID) 500 MG tablet Take 500 mg by mouth daily.     Facility-Administered Medications Prior to Visit  Medication Dose Route Frequency Provider Last Rate Last Admin   sodium chloride flush (NS) 0.9 % injection 3 mL  3 mL Intravenous Q12H Croitoru, Mihai, MD        Allergies  Allergen Reactions   Cefzil [Cefprozil] Other (See Comments)    angioedema    Family History  Problem  Relation Age of Onset   Heart disease Other    Osteoporosis Other    Arthritis Other    Colon cancer Neg Hx    Colon polyps Neg Hx     Social History   Tobacco Use   Smoking status: Never   Smokeless tobacco: Never  Vaping Use   Vaping Use: Never used  Substance Use Topics   Alcohol use: No    Alcohol/week: 0.0 standard drinks   Drug use: No    ROS: As per history of present illness, otherwise negative  BP (!) 158/76   Pulse 80   Ht '5\' 5"'$  (1.651 m)   Wt 139 lb (63 kg)   BMI 23.13 kg/m  Constitutional: Well-developed and well-nourished. No distress. HEENT: Normocephalic and atraumatic. No scleral icterus. Cardiovascular: Normal rate, regular rhythm and intact distal pulses. No M/R/G Pulmonary/chest: Effort normal and breath sounds normal. No wheezing, rales or rhonchi. Abdominal: Soft, nontender, nondistended. Bowel sounds active throughout. There are no masses palpable. No hepatosplenomegaly. Extremities: no clubbing, cyanosis, or edema Neurological: Alert and oriented to person place and time. Skin: Skin is warm and dry.  Psychiatric: Normal mood and affect. Behavior is normal.  RELEVANT LABS AND IMAGING: CBC    Component Value Date/Time   WBC 4.4 10/23/2020 1117   RBC 3.16 (L) 10/23/2020 1117   HGB 10.1 (L) 10/23/2020 1117   HGB 11.3 02/16/2020 1056   HCT 30.4 (L) 10/23/2020 1117   HCT 34.2 02/16/2020 1056   PLT 239.0 10/23/2020 1117   PLT 213 02/16/2020 1056   MCV 96.2 10/23/2020 1117   MCV 92 02/16/2020 1056   MCH 32.9 10/18/2020 1824   MCHC 33.3 10/23/2020 1117   RDW 13.7 10/23/2020 1117   RDW 13.3 02/16/2020 1056   LYMPHSABS 1.0 10/23/2020 1117   MONOABS 0.3 10/23/2020 1117   EOSABS 0.2 10/23/2020 1117   BASOSABS 0.1 10/23/2020 1117    CMP     Component Value Date/Time   NA 140 10/18/2020 1824   NA 142 02/16/2020 1056   K 3.9 10/18/2020 1824   CL 107 10/18/2020 1824   CO2 24 10/18/2020 1824   GLUCOSE 113 (H) 10/18/2020 1824   BUN 14  10/18/2020 1824   BUN 9 02/16/2020 1056   CREATININE 0.89 10/18/2020 1824   CALCIUM 9.0 10/18/2020 1824   GFRNONAA >60 10/18/2020 1824   GFRAA 88 02/16/2020 1056    ASSESSMENT/PLAN: 70 year old female with a history of iron deficiency anemia, symptomatic hemorrhoids, mild gastritis not related H. pylori, diverticulosis, atrial fibrillation on Eliquis, hypertrophic cardiomyopathy wit hyperdynamic EF who is seen to discuss colonoscopy to complete evaluation for IDA.   Iron deficiency anemia and history of heme positive stool --I recommended  that we repeat colonoscopy as this was incomplete when done in October 2021.  She has subsequently had pacemaker placement and thus bradycardia is no longer an issue.  Given her history HOCM I have recommended that we have this procedure performed in the outpatient hospital setting with anesthesia support.  She understands and is agreeable to proceed.  We discussed the risk, benefits and alternatives.  We will need to hold Eliquis 48 hours before.  I suggested she hold iron 7 days before. --Colonoscopy in the outpatient hospital setting --Hold Eliquis 48 hours before --If colonoscopy unrevealing consider outpatient video capsule endoscopy --Repeat CBC and iron studies today --Continue oral iron until holding 1 week before colonoscopy  Will hold Eliquis 2 days prior to endoscopic procedures - will instruct when and how to resume after procedure. Benefits and risks of procedure explained including risks of bleeding, perforation, infection, missed lesions, reactions to medications and possible need for hospitalization and surgery for complications. Additional rare but real risk of stroke or other vascular clotting events off Eliquis also explained and need to seek urgent help if any signs of these problems occur. Will communicate by phone or EMR with patient's  prescribing provider to confirm that holding Eliquis is reasonable in this case.   2.  Symptomatic  hemorrhoids --we will evaluate at the time of colonoscopy but we also discussed that she may be a good candidate for hemorrhoidal banding.  We discussed how this would carry a slightly higher than average risk of post banding hemorrhage in the setting of Eliquis.  We can discuss whether she wishes to proceed with this after we evaluate her hemorrhoids at the time of colonoscopy to determine if she is indeed a candidate for banding versus surgical excision.   [id number], Drema Dallas, [address] [place],  [place] [address]

## 2020-10-23 NOTE — Patient Instructions (Signed)
Continue oral iron twice daily. You should HOLD the iron though x 1 week before your colonoscopy.  You have been scheduled for a colonoscopy. Please follow written instructions given to you at your visit today.  Please pick up your prep supplies at the pharmacy within the next 1-3 days. If you use inhalers (even only as needed), please bring them with you on the day of your procedure.  Your provider has requested that you go to the basement level for lab work before leaving today. Press "B" on the elevator. The lab is located at the first door on the left as you exit the elevator.  If you are age 1 or older, your body mass index should be between 23-30. Your Body mass index is 23.13 kg/m. If this is out of the aforementioned range listed, please consider follow up with your Primary Care Provider.  __________________________________________________________  The [facility] providers would like to encourage you to use [facility] to communicate with providers for non-urgent requests or questions.  Due to long hold times on the telephone, sending your provider a message by [facility] may be a faster and more efficient way to get a response.  Please allow 48 business hours for a response.  Please remember that this is for non-urgent requests.   Due to recent changes in healthcare laws, you may see the results of your imaging and laboratory studies on MyChart before your provider has had a chance to review them.  We understand that in some cases there may be results that are confusing or concerning to you. Not all laboratory results come back in the same time frame and the provider may be waiting for multiple results in order to interpret others.  Please give [place] 48 hours in order for your provider to thoroughly review all the results before contacting the office for clarification of your results.

## 2020-10-24 NOTE — Telephone Encounter (Signed)
Patient with diagnosis of atrial fibrillation on Eliquis for anticoagulation.    Procedure: colonoscopy Date of procedure: 12/21/20   CHA2DS2-VASc Score = 4  This indicates a 4.8% annual risk of stroke. The patient's score is based upon: CHF History: No HTN History: Yes Diabetes History: No Stroke History: No Vascular Disease History: Yes Age Score: 1 Gender Score: 1   CrCl 61 Platelet count 239  Per office protocol, patient can hold Eliquis for 2 days prior to procedure.   Patient will not need bridging with Lovenox (enoxaparin) around procedure.  Patient should restart Eliquis on the evening of procedure or day after, at discretion of procedure MD

## 2020-10-24 NOTE — Telephone Encounter (Signed)
I have spoken with patient to advise that per cardiology, she may hold Eliquis 2 days prior to her upcoming procedure. She will restart Eliquis at the discretion of Dr Hilarie Fredrickson after the test. Patient verbalizes understanding of this information.

## 2020-10-24 NOTE — Telephone Encounter (Signed)
   Primary Cardiologist: Sanda Klein, MD  Chart reviewed as part of pre-operative protocol coverage. Given past medical history and time since last visit, based on ACC/AHA guidelines, [place] French would be at acceptable risk for the planned procedure without further cardiovascular testing.   Patient with diagnosis of atrial fibrillation on Eliquis for anticoagulation.     Procedure: colonoscopy Date of procedure: 12/21/20     CHA2DS2-VASc Score = 4  This indicates a 4.8% annual risk of stroke. The patient's score is based upon: CHF History: No HTN History: Yes Diabetes History: No Stroke History: No Vascular Disease History: Yes Age Score: 1 Gender Score: 1    CrCl 61 Platelet count 239   Per office protocol, patient can hold Eliquis for 2 days prior to procedure.   Patient will not need bridging with Lovenox (enoxaparin) around procedure.   Patient should restart Eliquis on the evening of procedure or day after, at discretion of procedure MD  I will route this recommendation to the requesting party via [place] fax function and remove from pre-op pool.  Please call with questions.  [place] Ng. Nemiah Kissner NP-C    10/24/2020, 1:55 PM [address] Office [phone] Fax [phone]

## 2020-10-26 ENCOUNTER — Other Ambulatory Visit: Payer: Self-pay | Admitting: Family Medicine

## 2020-11-09 ENCOUNTER — Other Ambulatory Visit: Payer: Self-pay | Admitting: Family Medicine

## 2020-11-09 ENCOUNTER — Other Ambulatory Visit: Payer: Self-pay | Admitting: *Deleted

## 2020-11-10 ENCOUNTER — Other Ambulatory Visit: Payer: Self-pay | Admitting: Cardiovascular Disease

## 2020-11-10 NOTE — Telephone Encounter (Signed)
Prescription refill request for Eliquis received. Last office visit:walker 10/20/20 Scr:0.89 10/18/20 Age: 4fWeight: 65kg

## 2020-11-27 ENCOUNTER — Ambulatory Visit (INDEPENDENT_AMBULATORY_CARE_PROVIDER_SITE_OTHER): Payer: Medicare PPO

## 2020-11-27 DIAGNOSIS — I495 Sick sinus syndrome: Secondary | ICD-10-CM

## 2020-11-27 LAB — CUP PACEART REMOTE DEVICE CHECK
Battery Remaining Longevity: 120 mo
Battery Remaining Percentage: 100 %
Brady Statistic RA Percent Paced: 91 %
Brady Statistic RV Percent Paced: 65 %
Date Time Interrogation Session: 20220829021100
Implantable Lead Implant Date: 20211129
Implantable Lead Implant Date: 20211129
Implantable Lead Location: 753859
Implantable Lead Location: 753860
Implantable Lead Model: 7840
Implantable Lead Model: 7841
Implantable Lead Serial Number: 1036898
Implantable Lead Serial Number: 1100104
Implantable Pulse Generator Implant Date: 20211129
Lead Channel Impedance Value: 614 Ohm
Lead Channel Impedance Value: 925 Ohm
Lead Channel Pacing Threshold Amplitude: 0.5 V
Lead Channel Pacing Threshold Amplitude: 0.7 V
Lead Channel Pacing Threshold Pulse Width: 0.4 ms
Lead Channel Pacing Threshold Pulse Width: 0.4 ms
Lead Channel Setting Pacing Amplitude: 3.5 V
Lead Channel Setting Pacing Amplitude: 3.5 V
Lead Channel Setting Pacing Pulse Width: 0.4 ms
Lead Channel Setting Sensing Sensitivity: 2.5 mV
Pulse Gen Serial Number: 951099

## 2020-11-28 ENCOUNTER — Other Ambulatory Visit: Payer: Self-pay | Admitting: Cardiovascular Disease

## 2020-12-01 ENCOUNTER — Telehealth: Payer: Self-pay | Admitting: Cardiovascular Disease

## 2020-12-01 MED ORDER — BISOPROLOL FUMARATE 10 MG PO TABS: ORAL_TABLET | ORAL | 3 refills | Status: DC

## 2020-12-01 NOTE — Telephone Encounter (Signed)
Refills has been sent to the pharmacy. 

## 2020-12-01 NOTE — Telephone Encounter (Signed)
*  STAT* If patient is at the pharmacy, call can be transferred to refill team.   1. Which medications need to be refilled? (please list name of each medication and dose if known) bisoprolol (ZEBETA) 5 MG tablet  2. Which pharmacy/location (including street and city if local pharmacy) is medication to be sent to? [id number] [place] [address]. Do they need a 30 day or 90 day supply? 90 day

## 2020-12-07 ENCOUNTER — Other Ambulatory Visit: Payer: Self-pay | Admitting: Family Medicine

## 2020-12-07 NOTE — Telephone Encounter (Signed)
Prescription refill request for Eliquis received. Indication:  PAF Last office visit: 10/20/20  Vella Raring NP Scr: 0.89 on 10/18/20 Age: 70 Weight:  63kg  Based on above findings Eliquis '5mg'$  twice daily is the appropriate dose.  Refill approved.

## 2020-12-08 ENCOUNTER — Other Ambulatory Visit: Payer: Self-pay | Admitting: *Deleted

## 2020-12-08 MED ORDER — APIXABAN 5 MG PO TABS: ORAL_TABLET | ORAL | 5 refills | Status: DC

## 2020-12-08 NOTE — Telephone Encounter (Signed)
Prescription refill request for Eliquis received. Indication: PAF Last office visit: 10/20/20  Vella Raring NP Scr: 0.89 on 10/18/20 Age: 70 Weight: 63kg  Based on above findings Eliquis '5mg'$  twice daily is the appropriate dose.  Refill approved.

## 2020-12-08 NOTE — Progress Notes (Signed)
Remote pacemaker transmission.   

## 2020-12-14 ENCOUNTER — Encounter (HOSPITAL_COMMUNITY): Payer: Self-pay

## 2020-12-14 ENCOUNTER — Inpatient Hospital Stay (HOSPITAL_COMMUNITY)
Admission: EM | Admit: 2020-12-14 | Discharge: 2020-12-25 | DRG: 264 | Disposition: A | Discharge status: Home Health | Payer: Medicare PPO | Attending: Internal Medicine | Admitting: Internal Medicine

## 2020-12-14 ENCOUNTER — Other Ambulatory Visit: Payer: Self-pay

## 2020-12-14 ENCOUNTER — Emergency Department (HOSPITAL_COMMUNITY): Payer: Medicare PPO

## 2020-12-14 DIAGNOSIS — K644 Residual hemorrhoidal skin tags: Secondary | ICD-10-CM | POA: Diagnosis present

## 2020-12-14 DIAGNOSIS — D500 Iron deficiency anemia secondary to blood loss (chronic): Secondary | ICD-10-CM | POA: Diagnosis not present

## 2020-12-14 DIAGNOSIS — Z950 Presence of cardiac pacemaker: Secondary | ICD-10-CM | POA: Diagnosis not present

## 2020-12-14 DIAGNOSIS — R079 Chest pain, unspecified: Secondary | ICD-10-CM | POA: Diagnosis not present

## 2020-12-14 DIAGNOSIS — R197 Diarrhea, unspecified: Secondary | ICD-10-CM | POA: Diagnosis not present

## 2020-12-14 DIAGNOSIS — I472 Ventricular tachycardia: Secondary | ICD-10-CM | POA: Diagnosis present

## 2020-12-14 DIAGNOSIS — J9601 Acute respiratory failure with hypoxia: Secondary | ICD-10-CM | POA: Diagnosis present

## 2020-12-14 DIAGNOSIS — E7849 Other hyperlipidemia: Secondary | ICD-10-CM | POA: Diagnosis present

## 2020-12-14 DIAGNOSIS — I517 Cardiomegaly: Secondary | ICD-10-CM | POA: Diagnosis not present

## 2020-12-14 DIAGNOSIS — G903 Multi-system degeneration of the autonomic nervous system: Secondary | ICD-10-CM | POA: Diagnosis present

## 2020-12-14 DIAGNOSIS — K219 Gastro-esophageal reflux disease without esophagitis: Secondary | ICD-10-CM | POA: Diagnosis present

## 2020-12-14 DIAGNOSIS — Y92239 Unspecified place in hospital as the place of occurrence of the external cause: Secondary | ICD-10-CM | POA: Diagnosis not present

## 2020-12-14 DIAGNOSIS — Z7901 Long term (current) use of anticoagulants: Secondary | ICD-10-CM

## 2020-12-14 DIAGNOSIS — Z8261 Family history of arthritis: Secondary | ICD-10-CM

## 2020-12-14 DIAGNOSIS — Z23 Encounter for immunization: Secondary | ICD-10-CM

## 2020-12-14 DIAGNOSIS — I2729 Other secondary pulmonary hypertension: Secondary | ICD-10-CM | POA: Diagnosis present

## 2020-12-14 DIAGNOSIS — Z888 Allergy status to other drugs, medicaments and biological substances status: Secondary | ICD-10-CM | POA: Diagnosis not present

## 2020-12-14 DIAGNOSIS — J9690 Respiratory failure, unspecified, unspecified whether with hypoxia or hypercapnia: Secondary | ICD-10-CM | POA: Diagnosis not present

## 2020-12-14 DIAGNOSIS — R7989 Other specified abnormal findings of blood chemistry: Secondary | ICD-10-CM | POA: Diagnosis not present

## 2020-12-14 DIAGNOSIS — I421 Obstructive hypertrophic cardiomyopathy: Secondary | ICD-10-CM | POA: Diagnosis present

## 2020-12-14 DIAGNOSIS — I214 Non-ST elevation (NSTEMI) myocardial infarction: Secondary | ICD-10-CM | POA: Diagnosis present

## 2020-12-14 DIAGNOSIS — I959 Hypotension, unspecified: Secondary | ICD-10-CM

## 2020-12-14 DIAGNOSIS — D508 Other iron deficiency anemias: Secondary | ICD-10-CM | POA: Diagnosis not present

## 2020-12-14 DIAGNOSIS — I5033 Acute on chronic diastolic (congestive) heart failure: Secondary | ICD-10-CM | POA: Diagnosis present

## 2020-12-14 DIAGNOSIS — K6389 Other specified diseases of intestine: Secondary | ICD-10-CM | POA: Diagnosis not present

## 2020-12-14 DIAGNOSIS — Z20822 Contact with and (suspected) exposure to covid-19: Secondary | ICD-10-CM | POA: Diagnosis present

## 2020-12-14 DIAGNOSIS — D649 Anemia, unspecified: Secondary | ICD-10-CM

## 2020-12-14 DIAGNOSIS — E876 Hypokalemia: Secondary | ICD-10-CM | POA: Diagnosis not present

## 2020-12-14 DIAGNOSIS — I9581 Postprocedural hypotension: Secondary | ICD-10-CM | POA: Diagnosis not present

## 2020-12-14 DIAGNOSIS — Z8262 Family history of osteoporosis: Secondary | ICD-10-CM | POA: Diagnosis not present

## 2020-12-14 DIAGNOSIS — K449 Diaphragmatic hernia without obstruction or gangrene: Secondary | ICD-10-CM | POA: Diagnosis not present

## 2020-12-14 DIAGNOSIS — I422 Other hypertrophic cardiomyopathy: Secondary | ICD-10-CM

## 2020-12-14 DIAGNOSIS — T502X5A Adverse effect of carbonic-anhydrase inhibitors, benzothiadiazides and other diuretics, initial encounter: Secondary | ICD-10-CM | POA: Diagnosis not present

## 2020-12-14 DIAGNOSIS — R002 Palpitations: Secondary | ICD-10-CM | POA: Diagnosis not present

## 2020-12-14 DIAGNOSIS — J811 Chronic pulmonary edema: Secondary | ICD-10-CM | POA: Diagnosis not present

## 2020-12-14 DIAGNOSIS — Z79899 Other long term (current) drug therapy: Secondary | ICD-10-CM

## 2020-12-14 DIAGNOSIS — N281 Cyst of kidney, acquired: Secondary | ICD-10-CM | POA: Diagnosis not present

## 2020-12-14 DIAGNOSIS — D62 Acute posthemorrhagic anemia: Secondary | ICD-10-CM | POA: Diagnosis present

## 2020-12-14 DIAGNOSIS — I700 Atherosclerosis of aorta: Secondary | ICD-10-CM | POA: Diagnosis not present

## 2020-12-14 DIAGNOSIS — I110 Hypertensive heart disease with heart failure: Secondary | ICD-10-CM | POA: Diagnosis present

## 2020-12-14 DIAGNOSIS — I2510 Atherosclerotic heart disease of native coronary artery without angina pectoris: Secondary | ICD-10-CM | POA: Diagnosis present

## 2020-12-14 DIAGNOSIS — I878 Other specified disorders of veins: Secondary | ICD-10-CM

## 2020-12-14 DIAGNOSIS — C180 Malignant neoplasm of cecum: Secondary | ICD-10-CM | POA: Diagnosis not present

## 2020-12-14 DIAGNOSIS — R195 Other fecal abnormalities: Secondary | ICD-10-CM

## 2020-12-14 DIAGNOSIS — R0602 Shortness of breath: Secondary | ICD-10-CM | POA: Diagnosis not present

## 2020-12-14 DIAGNOSIS — I9589 Other hypotension: Secondary | ICD-10-CM | POA: Diagnosis not present

## 2020-12-14 DIAGNOSIS — J9811 Atelectasis: Secondary | ICD-10-CM | POA: Diagnosis not present

## 2020-12-14 DIAGNOSIS — K648 Other hemorrhoids: Secondary | ICD-10-CM | POA: Diagnosis present

## 2020-12-14 DIAGNOSIS — Z90710 Acquired absence of both cervix and uterus: Secondary | ICD-10-CM

## 2020-12-14 DIAGNOSIS — R42 Dizziness and giddiness: Secondary | ICD-10-CM | POA: Diagnosis not present

## 2020-12-14 DIAGNOSIS — J90 Pleural effusion, not elsewhere classified: Secondary | ICD-10-CM | POA: Diagnosis not present

## 2020-12-14 DIAGNOSIS — K5900 Constipation, unspecified: Secondary | ICD-10-CM | POA: Diagnosis present

## 2020-12-14 DIAGNOSIS — I480 Paroxysmal atrial fibrillation: Secondary | ICD-10-CM | POA: Diagnosis present

## 2020-12-14 DIAGNOSIS — N179 Acute kidney failure, unspecified: Secondary | ICD-10-CM | POA: Diagnosis not present

## 2020-12-14 DIAGNOSIS — I495 Sick sinus syndrome: Secondary | ICD-10-CM | POA: Diagnosis present

## 2020-12-14 DIAGNOSIS — Z5331 Laparoscopic surgical procedure converted to open procedure: Secondary | ICD-10-CM

## 2020-12-14 DIAGNOSIS — K5730 Diverticulosis of large intestine without perforation or abscess without bleeding: Secondary | ICD-10-CM | POA: Diagnosis not present

## 2020-12-14 DIAGNOSIS — I447 Left bundle-branch block, unspecified: Secondary | ICD-10-CM | POA: Diagnosis present

## 2020-12-14 DIAGNOSIS — D490 Neoplasm of unspecified behavior of digestive system: Secondary | ICD-10-CM | POA: Diagnosis not present

## 2020-12-14 DIAGNOSIS — I10 Essential (primary) hypertension: Secondary | ICD-10-CM | POA: Diagnosis not present

## 2020-12-14 DIAGNOSIS — I951 Orthostatic hypotension: Secondary | ICD-10-CM | POA: Diagnosis not present

## 2020-12-14 LAB — CBC
HCT: 32.1 % — ABNORMAL LOW (ref 36.0–46.0)
Hemoglobin: 10.2 g/dL — ABNORMAL LOW (ref 12.0–15.0)
MCH: 31.8 pg (ref 26.0–34.0)
MCHC: 31.8 g/dL (ref 30.0–36.0)
MCV: 100 fL (ref 80.0–100.0)
Platelets: 342 10*3/uL (ref 150–400)
RBC: 3.21 MIL/uL — ABNORMAL LOW (ref 3.87–5.11)
RDW: 13.5 % (ref 11.5–15.5)
WBC: 9.2 10*3/uL (ref 4.0–10.5)
nRBC: 0 % (ref 0.0–0.2)

## 2020-12-14 LAB — BASIC METABOLIC PANEL
Anion gap: 8 (ref 5–15)
BUN: 13 mg/dL (ref 8–23)
CO2: 24 mmol/L (ref 22–32)
Calcium: 9.7 mg/dL (ref 8.9–10.3)
Chloride: 105 mmol/L (ref 98–111)
Creatinine, Ser: 0.94 mg/dL (ref 0.44–1.00)
GFR, Estimated: >60 mL/min (ref >60)
Glucose, Bld: 110 mg/dL — ABNORMAL HIGH (ref 70–99)
Potassium: 3.8 mmol/L (ref 3.5–5.1)
Sodium: 137 mmol/L (ref 135–145)

## 2020-12-14 LAB — PROTIME-INR
INR: 1.1 (ref 0.8–1.2)
Prothrombin Time: 14 seconds (ref 11.4–15.2)

## 2020-12-14 LAB — TROPONIN I (HIGH SENSITIVITY): Troponin I (High Sensitivity): 593 ng/L (ref <18)

## 2020-12-14 MED ORDER — ASPIRIN 81 MG PO CHEW
324.0000 mg | CHEWABLE_TABLET | Freq: Once | ORAL | Status: AC
  Administered 2020-12-14: 324 mg via ORAL
  Filled 2020-12-14: qty 4

## 2020-12-14 MED ORDER — SODIUM CHLORIDE 0.9 % IV BOLUS
500.0000 mL | Freq: Once | INTRAVENOUS | Status: AC
Start: 2020-12-14 — End: 2020-12-15
  Administered 2020-12-14: 500 mL via INTRAVENOUS

## 2020-12-14 MED ORDER — HEPARIN (PORCINE) 25000 UT/250ML-% IV SOLN
900.0000 [IU]/h | INTRAVENOUS | Status: DC
  Administered 2020-12-15: 900 [IU]/h via INTRAVENOUS
  Filled 2020-12-14: qty 250

## 2020-12-14 NOTE — Progress Notes (Signed)
ANTICOAGULATION CONSULT NOTE - Initial Consult  Pharmacy Consult for heparin Indication: chest pain/ACS and atrial fibrillation  Allergies  Allergen Reactions   Cefzil [Cefprozil] Other (See Comments)    angioedema    Patient Measurements: Height: '5\' 2"'$  (157.5 cm) Weight: 61.2 kg (135 lb) IBW/kg (Calculated) : 50.1  Vital Signs: Temp: 98.1 F (36.7 C) (09/15 2108) Temp Source: Oral (09/15 2108) BP: 91/53 (09/15 2300) Pulse Rate: 58 (09/15 2300)  Labs: Recent Labs    12/14/20 2130  HGB 10.2*  HCT 32.1*  PLT 342  LABPROT 14.0  INR 1.1  CREATININE 0.94  TROPONINIHS 593*    Estimated Creatinine Clearance: 50 mL/min (by C-G formula based on SCr of 0.94 mg/dL).   Medical History: Past Medical History:  Diagnosis Date   A-fib ([place])    CAD (coronary artery disease)    Diverticulosis    Esophageal reflux    no meds   External hemorrhoids    Gastritis    Hypertension    IDA (iron deficiency anemia)    Internal hemorrhoids    Other and unspecified hyperlipidemia    SSS (sick sinus syndrome) ([facility])     Assessment: 70yo female c/o CP radiating to LUE, initial troponin elevated, to start heparin.  Pt on Eliquis PTA for AFib, last dose 9/15 ~0830.  Goal of Therapy:  Heparin level 0.3-0.7 units/ml aPTT 66-102 seconds Monitor platelets by anticoagulation protocol: Yes   Plan:  Heparin infusion at 900 units/hr and monitor heparin levels, aPTT (while Eliquis affects anti-Xa), and CBC.  Wynona Neat, PharmD, BCPS  12/14/2020,11:47 PM

## 2020-12-14 NOTE — ED Triage Notes (Signed)
Pt presents to Ed with c/o heart palpitations, dizziness, and lightheaded earlier today. Pt says she feels better now, but earlier today, every time she got up and walked around would get lightheaded and dizzy and just "felt weak" - then pt took her BP and couldn't get a reading and thought she should come get checked out.

## 2020-12-14 NOTE — ED Notes (Signed)
Date and time results received: 12/14/20 2250  Test: troponin Critical Value: 593  Name of Provider Notified: Rogene Houston, MD  Orders Received? Or Actions Taken?: acknowledged

## 2020-12-14 NOTE — ED Provider Notes (Signed)
[facility] Emergency Department Provider Note MRN:  [id number]  Arrival date & time: 12/14/20     Chief Complaint   Chest pain History of Present Illness   Alexandria French is a 70 y.o. year-old female with a history of A. fib, sick sinus syndrome status post pacemaker presenting to the ED with chief complaint of chest pain.  Location: Left side of chest radiating down the left arm Duration: Throughout the day today, about 15 hours Onset: Sudden Timing: Intermittent Description: Sharp Severity: Moderate to severe when present Exacerbating/Alleviating Factors: None Associated Symptoms: Malaise, fatigue, shortness of breath, occasional right-sided neck pain Pertinent Negatives: No fever or cough, no abdominal pain, no numbness or weakness to the arms or legs  Additional History: None  Review of Systems  A complete 10 system review of systems was obtained and all systems are negative except as noted in the HPI and PMH.   Patient's Health History    Past Medical History:  Diagnosis Date   A-fib [facility])    CAD (coronary artery disease)    Diverticulosis    Esophageal reflux    no meds   External hemorrhoids    Gastritis    Hypertension    IDA (iron deficiency anemia)    Internal hemorrhoids    Other and unspecified hyperlipidemia    SSS (sick sinus syndrome) ([place])     Past Surgical History:  Procedure Laterality Date   ABDOMINAL HYSTERECTOMY  08/2008   BLADDER REPAIR     PACEMAKER IMPLANT N/A 02/28/2020   Procedure: PACEMAKER IMPLANT;  Surgeon: Sanda Klein, MD;  Location: [facility];  Service: Cardiovascular;  Laterality: N/A;   TONSILLECTOMY      Family History  Problem Relation Age of Onset   Heart disease Other    Osteoporosis Other    Arthritis Other    Colon cancer Neg Hx    Colon polyps Neg Hx     Social History   Socioeconomic History   Marital status: Married    Spouse name: Not on file   Number of children: Not on  file   Years of education: Not on file   Highest education level: Not on file  Occupational History   Not on file  Tobacco Use   Smoking status: Never   Smokeless tobacco: Never  Vaping Use   Vaping Use: Never used  Substance and Sexual Activity   Alcohol use: No    Alcohol/week: 0.0 standard drinks   Drug use: No   Sexual activity: Not on file  Other Topics Concern   Not on file  Social History Narrative   Not on file   Social Determinants of Health   Financial Resource Strain: Not on file  Food Insecurity: Not on file  Transportation Needs: Not on file  Physical Activity: Not on file  Stress: Not on file  Social Connections: Not on file  Intimate Partner Violence: Not on file     Physical Exam   Vitals:   12/14/20 2230 12/14/20 2300  BP: (!) 92/58 (!) 91/53  Pulse: 60 (!) 58  Resp: (!) 24 15  Temp:    SpO2: 98% 94%    CONSTITUTIONAL: Well-appearing, NAD NEURO:  Alert and oriented x 3, no focal deficits EYES:  eyes equal and reactive ENT/NECK:  no LAD, no JVD CARDIO: Regular rate, well-perfused, normal S1 and S2 PULM:  CTAB no wheezing or rhonchi GI/GU:  normal bowel sounds, non-distended, non-tender MSK/SPINE:  No gross deformities,  no edema SKIN:  no rash, atraumatic PSYCH:  Appropriate speech and behavior  *Additional and/or pertinent findings included in MDM below  Diagnostic and Interventional Summary    EKG Interpretation  Date/Time:  Thursday December 14 2020 22:52:43 EDT Ventricular Rate:  63 PR Interval:  158 QRS Duration: 138 QT Interval:  512 QTC Calculation: 516 R Axis:   -14 Text Interpretation: A-V dual-paced complexes w/ some inhibition No further analysis attempted due to paced rhythm Confirmed by Fredia Sorrow [phone]) on 12/14/2020 10:55:38 PM       Labs Reviewed  BASIC METABOLIC PANEL - Abnormal; Notable for the following components:      Result Value   Glucose, Bld 110 (*)    All other components within normal limits   CBC - Abnormal; Notable for the following components:   RBC 3.21 (*)    Hemoglobin 10.2 (*)    HCT 32.1 (*)    All other components within normal limits  TROPONIN I (HIGH SENSITIVITY) - Abnormal; Notable for the following components:   Troponin I (High Sensitivity) 593 (*)    All other components within normal limits  RESP PANEL BY RT-PCR (FLU A&B, COVID) [id number]  PROTIME-INR  TROPONIN I (HIGH SENSITIVITY)    DG Chest 2 View  Final Result      Medications  sodium chloride 0.9 % bolus 500 mL (has no administration in time range)  aspirin chewable tablet 324 mg (has no administration in time range)     Procedures  /  Critical Care .Critical Care Performed by: Maudie Flakes, MD Authorized by: Maudie Flakes, MD   Critical care provider statement:    Critical care time (minutes):  38   Critical care was necessary to treat or prevent imminent or life-threatening deterioration of the following conditions: Acute coronary syndrome.   Critical care was time spent personally by me on the following activities:  Discussions with consultants, evaluation of patient's response to treatment, examination of patient, ordering and performing treatments and interventions, ordering and review of laboratory studies, ordering and review of radiographic studies, pulse oximetry, re-evaluation of patient's condition, obtaining history from patient or surrogate and review of old charts   Care discussed with: accepting provider at another facility    ED Course and Medical Decision Making  I have reviewed the triage vital signs, the nursing notes, and pertinent available records from the EMR.  Listed above are laboratory and imaging tests that I personally ordered, reviewed, and interpreted and then considered in my medical decision making (see below for details).  Concerning chest pain description, risk factors for CAD, no cardiac catheterizations on record.  Initial troponin 500.  EKG showing diffuse T  wave inversions with some inferior ST depressions.  Patient largely pain-free, still feeling a bit short of breath, no hypoxia, no increased work of breathing.  Having some hypotension here in the emergency department, providing IV fluids and will monitor closely.  Case discussed with Dr. Hassell Done of [facility] cardiology, plan is to admit to cardiology.  Starting aspirin, heparin, fluids.       Barth Kirks. Sedonia Small, Maish Vaya [email or web address]  Final Clinical Impressions(s) / ED Diagnoses     ICD-10-CM   1. NSTEMI (non-ST elevated myocardial infarction) ([place])  I21.4       ED Discharge Orders     None        Discharge Instructions Discussed with and Provided to Patient:   Discharge Instructions  None       Maudie Flakes, MD 12/14/20 [phone]

## 2020-12-15 ENCOUNTER — Encounter (HOSPITAL_COMMUNITY)
Admission: EM | Disposition: A | Discharge status: Home Health | Payer: Self-pay | Source: Home / Self Care | Attending: Cardiovascular Disease

## 2020-12-15 ENCOUNTER — Observation Stay (HOSPITAL_COMMUNITY): Payer: Medicare PPO

## 2020-12-15 ENCOUNTER — Encounter: Payer: Self-pay | Admitting: Cardiovascular Disease

## 2020-12-15 ENCOUNTER — Inpatient Hospital Stay (HOSPITAL_COMMUNITY): Payer: Medicare PPO

## 2020-12-15 ENCOUNTER — Telehealth: Payer: Self-pay | Admitting: *Deleted

## 2020-12-15 DIAGNOSIS — D62 Acute posthemorrhagic anemia: Secondary | ICD-10-CM | POA: Diagnosis present

## 2020-12-15 DIAGNOSIS — I214 Non-ST elevation (NSTEMI) myocardial infarction: Secondary | ICD-10-CM

## 2020-12-15 DIAGNOSIS — I447 Left bundle-branch block, unspecified: Secondary | ICD-10-CM | POA: Diagnosis present

## 2020-12-15 DIAGNOSIS — I9581 Postprocedural hypotension: Secondary | ICD-10-CM | POA: Diagnosis not present

## 2020-12-15 DIAGNOSIS — I422 Other hypertrophic cardiomyopathy: Secondary | ICD-10-CM

## 2020-12-15 DIAGNOSIS — C180 Malignant neoplasm of cecum: Secondary | ICD-10-CM | POA: Diagnosis present

## 2020-12-15 DIAGNOSIS — G903 Multi-system degeneration of the autonomic nervous system: Secondary | ICD-10-CM | POA: Diagnosis present

## 2020-12-15 DIAGNOSIS — R195 Other fecal abnormalities: Secondary | ICD-10-CM | POA: Diagnosis not present

## 2020-12-15 DIAGNOSIS — I5033 Acute on chronic diastolic (congestive) heart failure: Secondary | ICD-10-CM | POA: Diagnosis present

## 2020-12-15 DIAGNOSIS — Z90710 Acquired absence of both cervix and uterus: Secondary | ICD-10-CM | POA: Diagnosis not present

## 2020-12-15 DIAGNOSIS — I2729 Other secondary pulmonary hypertension: Secondary | ICD-10-CM | POA: Diagnosis present

## 2020-12-15 DIAGNOSIS — R7989 Other specified abnormal findings of blood chemistry: Secondary | ICD-10-CM | POA: Diagnosis not present

## 2020-12-15 DIAGNOSIS — Z950 Presence of cardiac pacemaker: Secondary | ICD-10-CM | POA: Diagnosis not present

## 2020-12-15 DIAGNOSIS — I2510 Atherosclerotic heart disease of native coronary artery without angina pectoris: Secondary | ICD-10-CM | POA: Diagnosis present

## 2020-12-15 DIAGNOSIS — K648 Other hemorrhoids: Secondary | ICD-10-CM | POA: Diagnosis not present

## 2020-12-15 DIAGNOSIS — Z8262 Family history of osteoporosis: Secondary | ICD-10-CM | POA: Diagnosis not present

## 2020-12-15 DIAGNOSIS — Z888 Allergy status to other drugs, medicaments and biological substances status: Secondary | ICD-10-CM | POA: Diagnosis not present

## 2020-12-15 DIAGNOSIS — I421 Obstructive hypertrophic cardiomyopathy: Secondary | ICD-10-CM | POA: Diagnosis present

## 2020-12-15 DIAGNOSIS — D500 Iron deficiency anemia secondary to blood loss (chronic): Secondary | ICD-10-CM | POA: Diagnosis not present

## 2020-12-15 DIAGNOSIS — Y92239 Unspecified place in hospital as the place of occurrence of the external cause: Secondary | ICD-10-CM | POA: Diagnosis not present

## 2020-12-15 DIAGNOSIS — I495 Sick sinus syndrome: Secondary | ICD-10-CM | POA: Diagnosis present

## 2020-12-15 DIAGNOSIS — E7849 Other hyperlipidemia: Secondary | ICD-10-CM | POA: Diagnosis present

## 2020-12-15 DIAGNOSIS — D508 Other iron deficiency anemias: Secondary | ICD-10-CM | POA: Diagnosis not present

## 2020-12-15 DIAGNOSIS — I480 Paroxysmal atrial fibrillation: Secondary | ICD-10-CM | POA: Diagnosis present

## 2020-12-15 DIAGNOSIS — I472 Ventricular tachycardia: Secondary | ICD-10-CM | POA: Diagnosis present

## 2020-12-15 DIAGNOSIS — Z7901 Long term (current) use of anticoagulants: Secondary | ICD-10-CM | POA: Diagnosis not present

## 2020-12-15 DIAGNOSIS — Z79899 Other long term (current) drug therapy: Secondary | ICD-10-CM | POA: Diagnosis not present

## 2020-12-15 DIAGNOSIS — Z23 Encounter for immunization: Secondary | ICD-10-CM | POA: Diagnosis present

## 2020-12-15 DIAGNOSIS — Z20822 Contact with and (suspected) exposure to covid-19: Secondary | ICD-10-CM | POA: Diagnosis present

## 2020-12-15 DIAGNOSIS — I959 Hypotension, unspecified: Secondary | ICD-10-CM

## 2020-12-15 DIAGNOSIS — I9589 Other hypotension: Secondary | ICD-10-CM | POA: Diagnosis not present

## 2020-12-15 DIAGNOSIS — J9601 Acute respiratory failure with hypoxia: Secondary | ICD-10-CM | POA: Diagnosis present

## 2020-12-15 DIAGNOSIS — I951 Orthostatic hypotension: Secondary | ICD-10-CM | POA: Diagnosis not present

## 2020-12-15 DIAGNOSIS — N179 Acute kidney failure, unspecified: Secondary | ICD-10-CM | POA: Diagnosis not present

## 2020-12-15 DIAGNOSIS — Z8261 Family history of arthritis: Secondary | ICD-10-CM | POA: Diagnosis not present

## 2020-12-15 DIAGNOSIS — I110 Hypertensive heart disease with heart failure: Secondary | ICD-10-CM | POA: Diagnosis present

## 2020-12-15 HISTORY — PX: RIGHT/LEFT HEART CATH AND CORONARY ANGIOGRAPHY: CATH118266

## 2020-12-15 LAB — CBC
HCT: 30.7 % — ABNORMAL LOW (ref 36.0–46.0)
HCT: 30.3 % — ABNORMAL LOW (ref 36.0–46.0)
HCT: 32.4 % — ABNORMAL LOW (ref 36.0–46.0)
Hemoglobin: 9.6 g/dL — ABNORMAL LOW (ref 12.0–15.0)
Hemoglobin: 9.6 g/dL — ABNORMAL LOW (ref 12.0–15.0)
Hemoglobin: 10.3 g/dL — ABNORMAL LOW (ref 12.0–15.0)
MCH: 31.7 pg (ref 26.0–34.0)
MCH: 31.8 pg (ref 26.0–34.0)
MCH: 31.7 pg (ref 26.0–34.0)
MCHC: 31.3 g/dL (ref 30.0–36.0)
MCHC: 31.7 g/dL (ref 30.0–36.0)
MCHC: 31.8 g/dL (ref 30.0–36.0)
MCV: 101.3 fL — ABNORMAL HIGH (ref 80.0–100.0)
MCV: 100.3 fL — ABNORMAL HIGH (ref 80.0–100.0)
MCV: 99.7 fL (ref 80.0–100.0)
Platelets: 288 10*3/uL (ref 150–400)
Platelets: 265 10*3/uL (ref 150–400)
Platelets: 303 10*3/uL (ref 150–400)
RBC: 3.03 MIL/uL — ABNORMAL LOW (ref 3.87–5.11)
RBC: 3.02 MIL/uL — ABNORMAL LOW (ref 3.87–5.11)
RBC: 3.25 MIL/uL — ABNORMAL LOW (ref 3.87–5.11)
RDW: 13.3 % (ref 11.5–15.5)
RDW: 13.5 % (ref 11.5–15.5)
RDW: 13.6 % (ref 11.5–15.5)
WBC: 8.8 10*3/uL (ref 4.0–10.5)
WBC: 8.8 10*3/uL (ref 4.0–10.5)
WBC: 9.6 10*3/uL (ref 4.0–10.5)
nRBC: 0 % (ref 0.0–0.2)
nRBC: 0 % (ref 0.0–0.2)
nRBC: 0 % (ref 0.0–0.2)

## 2020-12-15 LAB — POCT I-STAT EG7
Acid-base deficit: 5 mmol/L — ABNORMAL HIGH (ref 0.0–2.0)
Acid-base deficit: 5 mmol/L — ABNORMAL HIGH (ref 0.0–2.0)
Bicarbonate: 20.5 mmol/L (ref 20.0–28.0)
Bicarbonate: 20.7 mmol/L (ref 20.0–28.0)
Calcium, Ion: 1.17 mmol/L (ref 1.15–1.40)
Calcium, Ion: 1.22 mmol/L (ref 1.15–1.40)
HCT: 27 % — ABNORMAL LOW (ref 36.0–46.0)
HCT: 28 % — ABNORMAL LOW (ref 36.0–46.0)
Hemoglobin: 9.2 g/dL — ABNORMAL LOW (ref 12.0–15.0)
Hemoglobin: 9.5 g/dL — ABNORMAL LOW (ref 12.0–15.0)
O2 Saturation: 53 %
O2 Saturation: 51 %
Potassium: 3.8 mmol/L (ref 3.5–5.1)
Potassium: 3.9 mmol/L (ref 3.5–5.1)
Sodium: 142 mmol/L (ref 135–145)
Sodium: 142 mmol/L (ref 135–145)
TCO2: 22 mmol/L (ref 22–32)
TCO2: 22 mmol/L (ref 22–32)
pCO2, Ven: 38.8 mmHg — ABNORMAL LOW (ref 44.0–60.0)
pCO2, Ven: 39.9 mmHg — ABNORMAL LOW (ref 44.0–60.0)
pH, Ven: 7.332 (ref 7.250–7.430)
pH, Ven: 7.323 (ref 7.250–7.430)
pO2, Ven: 30 mmHg — CL (ref 32.0–45.0)
pO2, Ven: 29 mmHg — CL (ref 32.0–45.0)

## 2020-12-15 LAB — ECHOCARDIOGRAM COMPLETE
AR max vel: 3.77 cm2
AV Area VTI: 2.92 cm2
AV Area mean vel: 2.7 cm2
AV Mean grad: 3.5 mmHg
AV Peak grad: 13.7 mmHg
Ao pk vel: 1.85 m/s
Height: 62 in
MV M vel: 3.44 m/s
MV Peak grad: 47.3 mmHg
S' Lateral: 2.4 cm
Single Plane A4C EF: 70.5 %
Weight: 2160 oz

## 2020-12-15 LAB — TROPONIN I (HIGH SENSITIVITY)
Troponin I (High Sensitivity): 502 ng/L (ref <18)
Troponin I (High Sensitivity): 601 ng/L (ref <18)
Troponin I (High Sensitivity): 652 ng/L (ref <18)

## 2020-12-15 LAB — BASIC METABOLIC PANEL
Anion gap: 7 (ref 5–15)
BUN: 10 mg/dL (ref 8–23)
CO2: 22 mmol/L (ref 22–32)
Calcium: 8.5 mg/dL — ABNORMAL LOW (ref 8.9–10.3)
Chloride: 111 mmol/L (ref 98–111)
Creatinine, Ser: 0.81 mg/dL (ref 0.44–1.00)
GFR, Estimated: >60 mL/min (ref >60)
Glucose, Bld: 120 mg/dL — ABNORMAL HIGH (ref 70–99)
Potassium: 3.9 mmol/L (ref 3.5–5.1)
Sodium: 140 mmol/L (ref 135–145)

## 2020-12-15 LAB — CBC WITH DIFFERENTIAL/PLATELET
Abs Immature Granulocytes: 0.05 10*3/uL (ref 0.00–0.07)
Basophils Absolute: 0 10*3/uL (ref 0.0–0.1)
Basophils Relative: 0 %
Eosinophils Absolute: 0 10*3/uL (ref 0.0–0.5)
Eosinophils Relative: 0 %
HCT: 32 % — ABNORMAL LOW (ref 36.0–46.0)
Hemoglobin: 10.3 g/dL — ABNORMAL LOW (ref 12.0–15.0)
Immature Granulocytes: 0 %
Lymphocytes Relative: 4 %
Lymphs Abs: 0.5 10*3/uL — ABNORMAL LOW (ref 0.7–4.0)
MCH: 31.8 pg (ref 26.0–34.0)
MCHC: 32.2 g/dL (ref 30.0–36.0)
MCV: 98.8 fL (ref 80.0–100.0)
Monocytes Absolute: 0.4 10*3/uL (ref 0.1–1.0)
Monocytes Relative: 3 %
Neutro Abs: 11.2 10*3/uL — ABNORMAL HIGH (ref 1.7–7.7)
Neutrophils Relative %: 93 %
Platelets: 288 10*3/uL (ref 150–400)
RBC: 3.24 MIL/uL — ABNORMAL LOW (ref 3.87–5.11)
RDW: 13.5 % (ref 11.5–15.5)
WBC: 12.2 10*3/uL — ABNORMAL HIGH (ref 4.0–10.5)
nRBC: 0 % (ref 0.0–0.2)

## 2020-12-15 LAB — CORTISOL: Cortisol, Plasma: 24.1 ug/dL

## 2020-12-15 LAB — POCT I-STAT 7, (LYTES, BLD GAS, ICA,H+H)
Acid-base deficit: 6 mmol/L — ABNORMAL HIGH (ref 0.0–2.0)
Bicarbonate: 19 mmol/L — ABNORMAL LOW (ref 20.0–28.0)
Calcium, Ion: 1.24 mmol/L (ref 1.15–1.40)
HCT: 29 % — ABNORMAL LOW (ref 36.0–46.0)
Hemoglobin: 9.9 g/dL — ABNORMAL LOW (ref 12.0–15.0)
O2 Saturation: 99 %
Potassium: 4 mmol/L (ref 3.5–5.1)
Sodium: 141 mmol/L (ref 135–145)
TCO2: 20 mmol/L — ABNORMAL LOW (ref 22–32)
pCO2 arterial: 34.2 mmHg (ref 32.0–48.0)
pH, Arterial: 7.353 (ref 7.350–7.450)
pO2, Arterial: 125 mmHg — ABNORMAL HIGH (ref 83.0–108.0)

## 2020-12-15 LAB — BRAIN NATRIURETIC PEPTIDE: B Natriuretic Peptide: 1765 pg/mL — ABNORMAL HIGH (ref 0.0–100.0)

## 2020-12-15 LAB — LACTIC ACID, PLASMA
Lactic Acid, Venous: 1.7 mmol/L (ref 0.5–1.9)
Lactic Acid, Venous: 1.8 mmol/L (ref 0.5–1.9)

## 2020-12-15 LAB — PROCALCITONIN: Procalcitonin: <0.1 ng/mL

## 2020-12-15 LAB — CREATININE, SERUM
Creatinine, Ser: 0.87 mg/dL (ref 0.44–1.00)
GFR, Estimated: >60 mL/min (ref >60)

## 2020-12-15 LAB — RESP PANEL BY RT-PCR (FLU A&B, COVID) ARPGX2
Influenza A by PCR: NEGATIVE
Influenza B by PCR: NEGATIVE
SARS Coronavirus 2 by RT PCR: NEGATIVE

## 2020-12-15 LAB — HIV ANTIBODY (ROUTINE TESTING W REFLEX): HIV Screen 4th Generation wRfx: NONREACTIVE

## 2020-12-15 LAB — APTT: aPTT: 40 seconds — ABNORMAL HIGH (ref 24–36)

## 2020-12-15 LAB — MRSA NEXT GEN BY PCR, NASAL: MRSA by PCR Next Gen: NOT DETECTED

## 2020-12-15 LAB — GLUCOSE, CAPILLARY: Glucose-Capillary: 114 mg/dL — ABNORMAL HIGH (ref 70–99)

## 2020-12-15 SURGERY — RIGHT/LEFT HEART CATH AND CORONARY ANGIOGRAPHY
Anesthesia: LOCAL

## 2020-12-15 MED ORDER — SODIUM CHLORIDE 0.9% FLUSH: 3.0000 mL | INTRAVENOUS | Status: DC | PRN

## 2020-12-15 MED ORDER — IOHEXOL 350 MG/ML SOLN
INTRAVENOUS | Status: DC | PRN
  Administered 2020-12-15: 35 mL via INTRA_ARTERIAL

## 2020-12-15 MED ORDER — ORAL CARE MOUTH RINSE
15.0000 mL | Freq: Two times a day (BID) | OROMUCOSAL | Status: DC
  Administered 2020-12-15 – 2020-12-18 (×4): 15 mL via OROMUCOSAL

## 2020-12-15 MED ORDER — IOHEXOL 350 MG/ML SOLN
100.0000 mL | Freq: Once | INTRAVENOUS | Status: AC | PRN
  Administered 2020-12-15: 100 mL via INTRAVENOUS

## 2020-12-15 MED ORDER — ONDANSETRON HCL 4 MG/2ML IJ SOLN
4.0000 mg | Freq: Once | INTRAMUSCULAR | Status: AC
  Administered 2020-12-15: 4 mg via INTRAVENOUS
  Filled 2020-12-15: qty 2

## 2020-12-15 MED ORDER — FUROSEMIDE 10 MG/ML IJ SOLN
40.0000 mg | Freq: Once | INTRAMUSCULAR | Status: AC
  Administered 2020-12-15: 40 mg via INTRAVENOUS
  Filled 2020-12-15: qty 4

## 2020-12-15 MED ORDER — ONDANSETRON HCL 4 MG/2ML IJ SOLN
4.0000 mg | Freq: Four times a day (QID) | INTRAMUSCULAR | Status: DC | PRN
  Administered 2020-12-15 – 2020-12-21 (×4): 4 mg via INTRAVENOUS
  Filled 2020-12-15 (×5): qty 2

## 2020-12-15 MED ORDER — HEPARIN SODIUM (PORCINE) 1000 UNIT/ML IJ SOLN
INTRAMUSCULAR | Status: DC | PRN
  Administered 2020-12-15: 3000 [IU] via INTRAVENOUS

## 2020-12-15 MED ORDER — HEPARIN (PORCINE) IN NACL 1000-0.9 UT/500ML-% IV SOLN
INTRAVENOUS | Status: DC | PRN
  Administered 2020-12-15 (×2): 500 mL via INTRA_ARTERIAL

## 2020-12-15 MED ORDER — ASPIRIN EC 81 MG PO TBEC
81.0000 mg | DELAYED_RELEASE_TABLET | Freq: Every day | ORAL | Status: DC
  Filled 2020-12-15: qty 1

## 2020-12-15 MED ORDER — SODIUM CHLORIDE 0.9 % IV SOLN: 250.0000 mL | INTRAVENOUS | Status: DC

## 2020-12-15 MED ORDER — CHLORHEXIDINE GLUCONATE CLOTH 2 % EX PADS
6.0000 | MEDICATED_PAD | Freq: Every day | CUTANEOUS | Status: DC
  Administered 2020-12-15: 6 via TOPICAL

## 2020-12-15 MED ORDER — SODIUM CHLORIDE 0.9 % IV BOLUS
1000.0000 mL | Freq: Once | INTRAVENOUS | Status: AC
  Administered 2020-12-15: 1000 mL via INTRAVENOUS

## 2020-12-15 MED ORDER — ENOXAPARIN SODIUM 40 MG/0.4ML IJ SOSY
40.0000 mg | PREFILLED_SYRINGE | INTRAMUSCULAR | Status: DC
  Administered 2020-12-15: 40 mg via SUBCUTANEOUS
  Filled 2020-12-15: qty 0.4

## 2020-12-15 MED ORDER — SODIUM CHLORIDE 0.9 % IV SOLN: INTRAVENOUS | Status: DC

## 2020-12-15 MED ORDER — VERAPAMIL HCL 2.5 MG/ML IV SOLN
INTRAVENOUS | Status: AC
  Filled 2020-12-15: qty 2

## 2020-12-15 MED ORDER — SODIUM CHLORIDE 0.9 % IV SOLN: INTRAVENOUS | Status: AC

## 2020-12-15 MED ORDER — VERAPAMIL HCL 2.5 MG/ML IV SOLN
INTRAVENOUS | Status: DC | PRN
  Administered 2020-12-15: 10 mL via INTRA_ARTERIAL

## 2020-12-15 MED ORDER — APIXABAN 5 MG PO TABS
5.0000 mg | ORAL_TABLET | Freq: Two times a day (BID) | ORAL | Status: DC
  Administered 2020-12-15 – 2020-12-17 (×5): 5 mg via ORAL
  Filled 2020-12-15: qty 1
  Filled 2020-12-15: qty 2
  Filled 2020-12-15 (×3): qty 1

## 2020-12-15 MED ORDER — SODIUM CHLORIDE 0.9% FLUSH
3.0000 mL | Freq: Two times a day (BID) | INTRAVENOUS | Status: DC
  Administered 2020-12-15 – 2020-12-20 (×9): 3 mL via INTRAVENOUS

## 2020-12-15 MED ORDER — SODIUM CHLORIDE 0.9 % IV SOLN: 250.0000 mL | INTRAVENOUS | Status: DC | PRN

## 2020-12-15 MED ORDER — LORAZEPAM 0.5 MG PO TABS
0.5000 mg | ORAL_TABLET | ORAL | Status: DC
  Administered 2020-12-15 – 2020-12-23 (×4): 0.5 mg via ORAL
  Filled 2020-12-15 (×6): qty 1

## 2020-12-15 MED ORDER — FERROUS SULFATE 325 (65 FE) MG PO TABS
325.0000 mg | ORAL_TABLET | Freq: Two times a day (BID) | ORAL | Status: DC
  Administered 2020-12-15 – 2020-12-18 (×6): 325 mg via ORAL
  Filled 2020-12-15 (×6): qty 1

## 2020-12-15 MED ORDER — LEVOFLOXACIN IN D5W 750 MG/150ML IV SOLN
750.0000 mg | INTRAVENOUS | Status: DC
  Administered 2020-12-15: 750 mg via INTRAVENOUS
  Filled 2020-12-15 (×2): qty 150

## 2020-12-15 MED ORDER — ACETAMINOPHEN 325 MG PO TABS: 650.0000 mg | ORAL_TABLET | ORAL | Status: DC | PRN

## 2020-12-15 MED ORDER — SODIUM CHLORIDE 0.9 % IV BOLUS
500.0000 mL | Freq: Once | INTRAVENOUS | Status: AC
  Administered 2020-12-15: 500 mL via INTRAVENOUS

## 2020-12-15 MED ORDER — ASPIRIN 81 MG PO CHEW
81.0000 mg | CHEWABLE_TABLET | ORAL | Status: AC
  Administered 2020-12-15: 81 mg via ORAL

## 2020-12-15 MED ORDER — CALCIUM CARBONATE-VITAMIN D 500-200 MG-UNIT PO TABS
1.0000 | ORAL_TABLET | Freq: Two times a day (BID) | ORAL | Status: DC
  Administered 2020-12-15 – 2020-12-25 (×19): 1 via ORAL
  Filled 2020-12-15 (×22): qty 1

## 2020-12-15 MED ORDER — LIDOCAINE HCL (PF) 1 % IJ SOLN
INTRAMUSCULAR | Status: AC
  Filled 2020-12-15: qty 30

## 2020-12-15 MED ORDER — HEPARIN (PORCINE) IN NACL 1000-0.9 UT/500ML-% IV SOLN
INTRAVENOUS | Status: AC
  Filled 2020-12-15: qty 1000

## 2020-12-15 MED ORDER — MELATONIN 3 MG PO TABS
3.0000 mg | ORAL_TABLET | Freq: Every day | ORAL | Status: DC
  Administered 2020-12-15 – 2020-12-24 (×9): 3 mg via ORAL
  Filled 2020-12-15 (×10): qty 1

## 2020-12-15 MED ORDER — HEPARIN SODIUM (PORCINE) 1000 UNIT/ML IJ SOLN
INTRAMUSCULAR | Status: AC
  Filled 2020-12-15: qty 1

## 2020-12-15 MED ORDER — LORATADINE 10 MG PO TABS
10.0000 mg | ORAL_TABLET | Freq: Every day | ORAL | Status: DC
  Administered 2020-12-15 – 2020-12-25 (×10): 10 mg via ORAL
  Filled 2020-12-15 (×10): qty 1

## 2020-12-15 MED ORDER — PHENYLEPHRINE HCL-NACL 20-0.9 MG/250ML-% IV SOLN
25.0000 ug/min | INTRAVENOUS | Status: DC
  Administered 2020-12-15: 25 ug/min via INTRAVENOUS
  Filled 2020-12-15 (×2): qty 250

## 2020-12-15 MED ORDER — ACETAMINOPHEN 500 MG PO TABS
500.0000 mg | ORAL_TABLET | Freq: Two times a day (BID) | ORAL | Status: DC | PRN
  Administered 2020-12-17: 500 mg via ORAL
  Filled 2020-12-15: qty 2
  Filled 2020-12-15: qty 1

## 2020-12-15 MED ORDER — SODIUM CHLORIDE 0.9 % IV SOLN
250.0000 mL | INTRAVENOUS | Status: DC | PRN
  Administered 2020-12-16: 250 mL via INTRAVENOUS

## 2020-12-15 MED ORDER — COQ-10 100 MG PO CAPS: 100.0000 mg | ORAL_CAPSULE | Freq: Every day | ORAL | Status: DC

## 2020-12-15 MED ORDER — NITROGLYCERIN 0.4 MG SL SUBL: 0.4000 mg | SUBLINGUAL_TABLET | SUBLINGUAL | Status: DC | PRN

## 2020-12-15 MED ORDER — SIMVASTATIN 20 MG PO TABS
20.0000 mg | ORAL_TABLET | Freq: Every day | ORAL | Status: DC
  Administered 2020-12-15 – 2020-12-25 (×10): 20 mg via ORAL
  Filled 2020-12-15 (×10): qty 1

## 2020-12-15 MED ORDER — METOPROLOL TARTRATE 12.5 MG HALF TABLET: 12.5000 mg | ORAL_TABLET | Freq: Two times a day (BID) | ORAL | Status: DC

## 2020-12-15 MED ORDER — SODIUM CHLORIDE 0.9% FLUSH
3.0000 mL | Freq: Two times a day (BID) | INTRAVENOUS | Status: DC
  Administered 2020-12-15 – 2020-12-25 (×14): 3 mL via INTRAVENOUS

## 2020-12-15 MED ORDER — LIDOCAINE HCL (PF) 1 % IJ SOLN
INTRAMUSCULAR | Status: DC | PRN
  Administered 2020-12-15 (×2): 2 mL via SUBCUTANEOUS

## 2020-12-15 MED ORDER — ASCORBIC ACID 500 MG PO TABS
500.0000 mg | ORAL_TABLET | Freq: Every day | ORAL | Status: DC
  Administered 2020-12-15 – 2020-12-25 (×10): 500 mg via ORAL
  Filled 2020-12-15 (×10): qty 1

## 2020-12-15 SURGICAL SUPPLY — 14 items
CATH INFINITI 5 FR MPA2 (CATHETERS) ×2 IMPLANT
CATH OPTITORQUE TIG 4.0 5F (CATHETERS) ×2 IMPLANT
CATH SWAN GANZ 7F STRAIGHT (CATHETERS) ×2 IMPLANT
DEVICE RAD COMP TR BAND LRG (VASCULAR PRODUCTS) ×2 IMPLANT
DEVICE RAD TR BAND REGULAR (VASCULAR PRODUCTS) ×2 IMPLANT
GLIDESHEATH SLEND SS 6F .021 (SHEATH) ×2 IMPLANT
GLIDESHEATH SLENDER 7FR .021G (SHEATH) ×2 IMPLANT
GUIDEWIRE INQWIRE 1.5J.035X260 (WIRE) ×1 IMPLANT
INQWIRE 1.5J .035X260CM (WIRE) ×2
KIT HEART LEFT (KITS) ×2 IMPLANT
PACK CARDIAC CATHETERIZATION (CUSTOM PROCEDURE TRAY) ×2 IMPLANT
SYR MEDRAD MARK 7 150ML (SYRINGE) ×2 IMPLANT
TRANSDUCER W/STOPCOCK (MISCELLANEOUS) ×2 IMPLANT
TUBING CIL FLEX 10 FLL-RA (TUBING) ×2 IMPLANT

## 2020-12-15 NOTE — Plan of Care (Signed)
Patient is hypotensive at cath lab holding area. Examined at bedside with Dr Audie Box, states feeling unwell, weak, and SOB while in Trendelenburg position. BP was 88/48, MAP >60. Pox 92% on 5LNC. She denied dizziness and chest pain. Lung sounds with rales bilaterally. BLE trace edema. She is pale, able to answer questions appropriately.   Sepsis workup overall negative so far for procal and lactic acid.   Given persistent hypotension, will send random cortisol level.  Dr. Audie Box recommend transfer patient to ICU 2H and initiate phenylephrine gtt to support BP while diuresis can be performed.  Discussed with 2 heart pharmacy, attempted to broaden coverage for suspected sepsis 2/ 2 CAP, although she does not have significant risk factor for MRSA as well as significant allergy to cefprozil.  Pharmacy agreed that Levaquin is a good option and coverage for now

## 2020-12-15 NOTE — ED Notes (Signed)
Pt reporting some nausea. MD made aware. MD also made aware of BP

## 2020-12-15 NOTE — Progress Notes (Signed)
Left heart catheterization with no significant CAD as expected.  Peak LVOT gradient roughly 65 to 70 mmHg.  Right heart catheterization demonstrates normal right atrial pressure of 3.  Wedge pressure elevated at 19.  Overall suspect she is having increased outflow tract obstruction.  Does not appear to be in shock based on cardiac output of 3.3 L/min and cardiac index of 2.0 L/m/min2.  For now we will add back her beta-blocker to try to reduce her outflow tract obstruction.  She will need diuresis to improve her pulmonary edema.  She may need phenylephrine if her blood pressure remains low to diurese her adequately.  We will need to be cautious with diuresis given her low blood pressure.  I suspect her hypotension will actually improve with beta-blocker.  Hopefully we can decrease her outflow tract obstruction.  [place] T. Audie Box, MD, [place]  [address], [place], [place] [address] [phone]  12:36 PM

## 2020-12-15 NOTE — Progress Notes (Signed)
PHARMACIST - PHYSICIAN ORDER COMMUNICATION  CONCERNING: P&T Medication Policy on Herbal Medications  DESCRIPTION:  This patient's order for:  Coenzyme Q10  has been noted.  This product(s) is classified as an "herbal" or natural product. Due to a lack of definitive safety studies or FDA approval, nonstandard manufacturing practices, plus the potential risk of unknown drug-drug interactions while on inpatient medications, the Pharmacy and Therapeutics Committee does not permit the use of "herbal" or natural products of this type within [facility].   ACTION TAKEN: The pharmacy department is unable to verify this order at this time and your patient has been informed of this safety policy. Please reevaluate patient's clinical condition at discharge and address if the herbal or natural product(s) should be resumed at that time.  Sherlon Handing, PharmD, BCPS Please see amion for complete clinical pharmacist phone list 12/15/2020 4:21 AM

## 2020-12-15 NOTE — ED Notes (Signed)
MD at bedside. 

## 2020-12-15 NOTE — Progress Notes (Signed)
Cardiology Progress Note  Patient ID: Taleisha Krauskopf MRN: [id number] DOB: [birth year] Date of Encounter: 12/15/2020  Primary Cardiologist: Sanda Klein, MD  Subjective   Chief Complaint: Weakness/fatigue  HPI: Reports she is not feeling well.  CT scan with pulmonary edema versus pneumonia.  Blood pressures are low.  She reports weakness and fatigue over the last 24 hours.  Overall I suspect her troponin elevation is demand in the setting of hypertrophic cardiomyopathy and hypotension.  I have ordered sepsis labs.    ROS:  All other ROS reviewed and negative. Pertinent positives noted in the HPI.     Inpatient Medications  Scheduled Meds:  vitamin C  500 mg Oral Daily   [START ON 12/16/2020] aspirin EC  81 mg Oral Daily   calcium-vitamin D  1 tablet Oral BID   ferrous sulfate  325 mg Oral BID WC   loratadine  10 mg Oral Daily   LORazepam  0.5 mg Oral Q48H   simvastatin  20 mg Oral Daily   sodium chloride flush  3 mL Intravenous Q12H   Continuous Infusions:  PRN Meds: acetaminophen, nitroGLYCERIN, ondansetron (ZOFRAN) IV   Vital Signs   Vitals:   12/15/20 0530 12/15/20 0545 12/15/20 0700 12/15/20 0715  BP: 101/66 104/68 99/71 103/69  Pulse: 63 62 67 67  Resp: (!) 28 (!) 26 (!) 30 (!) 29  Temp:      TempSrc:      SpO2: 100% 100% 98% 100%  Weight:      Height:       No intake or output data in the 24 hours ending 12/15/20 0834 Last 3 Weights 12/14/2020 12/14/2020 10/23/2020  Weight (lbs) 135 lb 135 lb 139 lb  Weight (kg) 61.236 kg 61.236 kg 63.05 kg      Telemetry  Overnight telemetry shows sinus rhythm in the 70s, which I personally reviewed.   ECG  The most recent ECG shows sinus rhythm heart rate 66, left bundle branch block, diffuse T wave inversions inferolateral, which I personally reviewed.   Physical Exam   Vitals:   12/15/20 0530 12/15/20 0545 12/15/20 0700 12/15/20 0715  BP: 101/66 104/68 99/71 103/69  Pulse: 63 62 67 67  Resp: (!) 28 (!) 26 (!) 30  (!) 29  Temp:      TempSrc:      SpO2: 100% 100% 98% 100%  Weight:      Height:       No intake or output data in the 24 hours ending 12/15/20 0834  Last 3 Weights 12/14/2020 12/14/2020 10/23/2020  Weight (lbs) 135 lb 135 lb 139 lb  Weight (kg) 61.236 kg 61.236 kg 63.05 kg    Body mass index is 24.69 kg/m.   General: Ill-appearing Head: Atraumatic, normal size  Eyes: PEERLA, EOMI  Neck: Supple, no JVD Endocrine: No thryomegaly Cardiac: Normal S1, S2; RRR; 3 out of 6 systolic ejection murmur Lungs: Diminished breath sounds Abd: Soft, nontender, no hepatomegaly  Ext: No edema, pulses 2+ Musculoskeletal: No deformities, BUE and BLE strength normal and equal Skin: Warm and dry, no rashes   Neuro: Alert and oriented to person, place, time, and situation, CNII-XII grossly intact, no focal deficits  Psych: Normal mood and affect   Labs  High Sensitivity Troponin:   Recent Labs  Lab 12/14/20 2130 12/14/20 2345 12/15/20 0618  TROPONINIHS 593* 502* 601*     Cardiac EnzymesNo results for input(s): TROPONINI in the last 168 hours. No results for input(s): TROPIPOC in the last  168 hours.  Chemistry Recent Labs  Lab 12/14/20 2130 12/15/20 0618  NA 137 140  K 3.8 3.9  CL 105 111  CO2 24 22  GLUCOSE 110* 120*  BUN 13 10  CREATININE 0.94 0.81  CALCIUM 9.7 8.5*  GFRNONAA >60 >60  ANIONGAP 8 7    Hematology Recent Labs  Lab 12/14/20 2130 12/15/20 0618  WBC 9.2 8.8  RBC 3.21* 3.03*  HGB 10.2* 9.6*  HCT 32.1* 30.7*  MCV 100.0 101.3*  MCH 31.8 31.7  MCHC 31.8 31.3  RDW 13.5 13.3  PLT 342 288   BNPNo results for input(s): BNP, PROBNP in the last 168 hours.  DDimer No results for input(s): DDIMER in the last 168 hours.   Radiology  DG Chest 2 View  Result Date: 12/14/2020 CLINICAL DATA:  Chest pain EXAM: CHEST - 2 VIEW COMPARISON:  10/18/2020 FINDINGS: Left-sided pacing device. Mild cardiomegaly without focal opacity or pleural effusion. Mild diffuse interstitial  opacity, possible low-grade edema. Probable underlying bronchitic changes. Aortic atherosclerosis IMPRESSION: Cardiomegaly with probable mild vascular congestion interstitial edema. Electronically Signed   By: Donavan Foil M.D.   On: 12/14/2020 22:12   CT Angio Chest Pulmonary Embolism (PE) W or WO Contrast  Result Date: 12/15/2020 CLINICAL DATA:  Palpitations with dizziness and lightheadedness. EXAM: CT ANGIOGRAPHY CHEST WITH CONTRAST TECHNIQUE: Multidetector CT imaging of the chest was performed using the standard protocol during bolus administration of intravenous contrast. Multiplanar CT image reconstructions and MIPs were obtained to evaluate the vascular anatomy. CONTRAST:  158m OMNIPAQUE IOHEXOL 350 MG/ML SOLN COMPARISON:  December 01, 2018 FINDINGS: Cardiovascular: Satisfactory opacification of the pulmonary arteries to the segmental level. No evidence of pulmonary embolism. There is mild cardiomegaly mild coronary artery calcification. No pericardial effusion. Mediastinum/Nodes: No enlarged mediastinal, hilar, or axillary lymph nodes. Peribronchial thickening is seen involving the bilateral mainstem bronchi and multiple proximal branches. The thyroid gland and esophagus demonstrate no significant findings. Lungs/Pleura: Markedly severity interstitial thickening is noted throughout both lungs. Mild to moderate severity atelectasis and/or infiltrate is also seen within the left upper lobe and bilateral lower lobes. There are small bilateral pleural effusions. No pneumothorax is identified. Upper Abdomen: A moderate to large hiatal hernia is seen. Musculoskeletal: Degenerative changes seen throughout the thoracic spine. Review of the MIP images confirms the above findings. IMPRESSION: 1. No CT evidence of acute pulmonary embolism. 2. Markedly severity interstitial thickening throughout both lungs, consistent with pulmonary edema. 3. Mild to moderate severity left upper lobe and bilateral lower lobe  atelectasis and/or infiltrate. 4. Small bilateral pleural effusions. 5. Moderate to large hiatal hernia. Electronically Signed   By: TVirgina NorfolkM.D.   On: 12/15/2020 02:37    Cardiac Studies  TTE 08/27/2019 1. LVOT peak gradient 213 mmHg. Left ventricular ejection fraction, by  estimation, is 70 to 75%. The left ventricle has hyperdynamic function.  The left ventricle has no regional wall motion abnormalities. There is  mild concentric left ventricular  hypertrophy. Left ventricular diastolic parameters are consistent with  Grade I diastolic dysfunction (impaired relaxation). Elevated left atrial  pressure.   2. Right ventricular systolic function is normal. The right ventricular  size is normal. There is normal pulmonary artery systolic pressure.   3. The mitral valve is grossly normal. Trivial mitral valve  regurgitation.   4. The aortic valve is tricuspid. Aortic valve regurgitation is mild. No  aortic stenosis is present.   5. The inferior vena cava is normal in size with greater than  50%  respiratory variability, suggesting right atrial pressure of 3 mmHg.   Patient Profile  Noomi Scala is a 70 y.o. female with paroxysmal atrial fibrillation on Eliquis, sick sinus syndrome status post pacemaker, hypertension, nonobstructive CAD who was admitted on 12/15/2020 with dizziness and shortness of breath and elevated troponin.  Assessment & Plan    #Dizziness #Shortness of breath #Pulmonary edema #Elevated troponin, likely demand ischemia in setting of hypertrophic or myopathy #Pneumonia? #Hypertension -She presents with vague symptoms for the last 24 hours.  Reports poor p.o. intake.  She reports has not been drinking much water.  She now presents with chest discomfort and shortness of breath.  Chest CT shows pulmonary edema but I am concerned for possible pneumonia. -Her troponin is elevated and rising.  Coronary CTA in 2020 with nonobstructive CAD.  She could be having demand  ischemia in setting hypertrophic cardiomyopathy as well as worsening obstruction.  She has a noticeable murmur. -I do not think this represents an acute coronary syndrome.  We will stop heparin. -Is a bit difficult to assess her hemodynamics.  I think she does merit left heart catheterization and we can perform a diagnostic coronary angiogram as well as pullback to determine LVOT gradient. -We have sent blood cultures, procalcitonin, lactic acid.  We will also send BNP.  We will hold on further intravenous fluids. -Last dose of Eliquis was yesterday morning.  She has been 24 hours without this.  I think cardiac cath is warranted.  We will also do right heart catheterization to assess hemodynamics. -Echo is pending.  On my review what was being done during the room it is significant for normal LV function.  There was difficulties in assessing LVOT gradients. -Hold home diltiazem and bisoprolol for now. -We will go ahead and start empiric antibiotics for pneumonia.  Further work-up is pending.  #Hypertrophic obstructive cardiomyopathy #Hypotension #pulmonary edema  -Significant LVOT gradient 1 year ago.  She is now here with hypotension and feeling poorly.  I suspect pneumonia could be playing here.  We do have plans to pursue left and right heart catheterization to better assess her hemodynamics. -Hold bisoprolol as well as diltiazem. -If hypotension worsens she may need phenylephrine. -No more intravenous fluids.  #Paroxysmal atrial fibrillation -Holding home Eliquis.  Left right heart catheterization today.  #Anemia -No signs of bleeding.  She is going to have an outpatient colonoscopy. -We will pursue diagnostic coronary angiogram with left and right heart catheterization to better assess hemodynamics.  This is for hokum. -I will not ask for any PCI to be performed.  I suspect this is all secondary.  FEN -no IVF -diet: NPO -code: full -dvt ppx: subcutaneous heparin   For questions or  updates, please contact [place] Please consult [email or web address] for contact info under   Time Spent with Patient: I have spent a total of 35 minutes with patient reviewing hospital notes, telemetry, EKGs, labs and examining the patient as well as establishing an assessment and plan that was discussed with the patient.  > 50% of time was spent in direct patient care.    Signed, Addison Naegeli. Audie Box, MD, [place]  12/15/2020 8:34 AM

## 2020-12-15 NOTE — ED Notes (Signed)
Pt desatting to 87% on 3L [place] - bumped up to 6L at this time

## 2020-12-15 NOTE — ED Notes (Signed)
Pt arrived via [facility].  Per [facility] pt with chief complaint of intermittent chest pain today with palpitations. Pt also reporting having low bp this morning.  Pt has hx of afib, sick sinus syndrome, and has a pacemaker.  Pt AV paced with [facility].  Pt initial bP with 99/64, lowest wsa 79/55. Pt has been given 1L of Fluid and 324 asa. Pt troponins elevated at 593 and 502 - pt started on Heparin drip.  Per [facility] pt was satting 85% on  2L, bumped up to 6L and satting at 97%. [facility] bumped pt down back to 2L.  Per [facility] pt CTA showed pulmonary edema.  Pt is AO and complaining of no pain at this time

## 2020-12-15 NOTE — H&P (Signed)
Cardiology Admission History and Physical:   [name] MRN: [id number]; DOB: 1951-01-09   Admission date: 12/14/2020  PCP:  Niwot Nation, MD   [facility] Providers Cardiologist:  Sanda Klein, MD        Chief Complaint:  dizziness/SOB  Patient Profile:   Alexandria French is a 71 y.o. female with h/o PAF on chronic eliquis, SB and SSS s/p PPM, HLD, elevated coronary Ca2+ score w/o significant stenosis on cCTA 12/2018, who is being seen 12/15/2020 for the evaluation of dizziness, SOB and elevated HS troponin.  History of Present Illness:   Alexandria French presented to the ED at [facility] earlier this evening with c/o new-onset palpitations, SOB and dizziness over the past couple days. She says she felt weak and felt like her BP was low, so she came to the ED for evaluation this afternoon. Pt mentions associated discomfort in her R neck and arm, and somewhat in her chest, but it does not seem that this is her primary complaint. Her primary issue is the dizziness/lightheadedness and SOB. Her BP has been running low at home, and it was so low earlier today that she was not able to get a valid reading when she tried to check it. Initial documented BP in the ED was 92/58, and initial HS trop was 593. A repeat HS trop was 502.  While in the [facility] ED, pt became hypotensive enough to require fluid boluses; BP was [id number] systolic. SBP remains in 80s on my evaluation. Hgb is low at 10.2 but this appears to be chronic; she is scheduled for colonoscopy on 9/22. CTA done in ED did not show evidence of PE, but interstitial thickening noted in the lungs bilaterally, as well as mild-moderate LUL and bilateral lobe atelectasis and/or infiltrate.  Prior cardiology notes state that it has been felt in the past that pt's echo findings are c/w HOCM, and she has had issues in the past with presyncope and syncope when dehydrated (due to diuretics).   Per cardiology note from 06-12-20, "2 separate  echocardiograms performed in August 2020 in May 2021 show findings consistent with hypertrophic obstructive cardiomyopathy, although the degree of left ventricular hypertrophy is relatively mild and appears to be concentric rather than asymmetrical.  Systolic anterior motion of the mitral valve with increased gradients across the left ventricular outflow tract.  These have been calculated at 145 mmHg and 213 mmHg respectively, although I am not sure that the estimation is accurate.  There seems to be a lot of contamination with mitral regurgitant flow on the Doppler signal.  There is however convincing evidence of gradients across the LVOT of at least 50 mmHg at rest.  On both studies the left ventricular systolic function is hyperdynamic with ejection fraction in excess of 75%.  There is no evidence of major abnormalities of the aortic valve itself."   Past Medical History:  Diagnosis Date   A-fib ([place])    CAD (coronary artery disease)    Diverticulosis    Esophageal reflux    no meds   External hemorrhoids    Gastritis    Hypertension    IDA (iron deficiency anemia)    Internal hemorrhoids    Other and unspecified hyperlipidemia    SSS (sick sinus syndrome) ([place])     Past Surgical History:  Procedure Laterality Date   ABDOMINAL HYSTERECTOMY  08/2008   BLADDER REPAIR     PACEMAKER IMPLANT N/A 02/28/2020   Procedure: PACEMAKER IMPLANT;  Surgeon: Croitoru,  Dani Gobble, MD;  Location: [facility];  Service: Cardiovascular;  Laterality: N/A;   TONSILLECTOMY       Medications Prior to Admission: Prior to Admission medications   Medication Sig Start Date End Date Taking? Authorizing Provider  acetaminophen (TYLENOL) 500 MG tablet Take 500-1,000 mg by mouth 2 (two) times daily as needed (back pain.).    [provider]  apixaban (ELIQUIS) 5 MG TABS tablet TAKE 1 TABLET(5 MG) BY MOUTH TWICE DAILY 12/08/20   Croitoru, Dani Gobble, MD  bisoprolol (ZEBETA) 10 MG tablet Take 10 mg every  morning and (1/2 tablet) 5 mg every afternoon 12/01/20   Croitoru, Mihai, MD  bisoprolol (ZEBETA) 5 MG tablet TAKE 1/2 TABLET BY MOUTH DAILY 12/01/20   Croitoru, Mihai, MD  Calcium Carbonate-Vitamin D 600-400 MG-UNIT tablet Take 1 tablet by mouth 2 (two) times daily.     [provider]  cetirizine (ZYRTEC) 10 MG tablet Take 10 mg by mouth every evening.     [provider]  Coenzyme Q10 (COQ-10) 100 MG CAPS Take 100 mg by mouth daily.     [provider]  diltiazem (DILACOR XR) 120 MG 24 hr capsule Take 1 capsule (120 mg total) by mouth daily. 10/20/20   Loel Dubonnet, NP  ferrous sulfate 325 (65 FE) MG EC tablet Take 325 mg by mouth in the morning and at bedtime.    [provider]  fluticasone (FLONASE) 50 MCG/ACT nasal spray Place 2 sprays into both nostrils daily as needed (allergies.).     [provider]  LORazepam (ATIVAN) 1 MG tablet Take 0.5-1 mg by mouth at bedtime as needed for anxiety or sleep.     [provider]  simvastatin (ZOCOR) 20 MG tablet Take 20 mg by mouth daily.    [provider]  Sodium Sulfate-Mag Sulfate-KCl (SUTAB) [phone] MG TABS Use as directed for colonoscopy. MANUFACTURER CODES!! BIN: [id number] PCN: CN GROUP: [id number] MEMBER ID: [id number] AS SECONDARY INSURANCE ;NO PRIOR AUTHORIZATION 10/23/20   Pyrtle, Lajuan Lines, MD  vitamin C (ASCORBIC ACID) 500 MG tablet Take 500 mg by mouth daily.    [provider]     Allergies:    Allergies  Allergen Reactions   Cefzil [Cefprozil] Other (See Comments)    angioedema    Social History:   Social History   Socioeconomic History   Marital status: Married    Spouse name: Not on file   Number of children: Not on file   Years of education: Not on file   Highest education level: Not on file  Occupational History   Not on file  Tobacco Use   Smoking status: Never   Smokeless tobacco: Never  Vaping Use   Vaping Use: Never used  Substance and  Sexual Activity   Alcohol use: No    Alcohol/week: 0.0 standard drinks   Drug use: No   Sexual activity: Not on file  Other Topics Concern   Not on file  Social History Narrative   Not on file   Social Determinants of Health   Financial Resource Strain: Not on file  Food Insecurity: Not on file  Transportation Needs: Not on file  Physical Activity: Not on file  Stress: Not on file  Social Connections: Not on file  Intimate Partner Violence: Not on file    Family History:   The patient's family history includes Arthritis in an other family member; Heart disease in an other family member; Osteoporosis in an other  family member. There is no history of Colon cancer or Colon polyps.    ROS:  Please see the history of present illness.  All other ROS reviewed and negative.     Physical Exam/Data:   Vitals:   12/15/20 0030 12/15/20 0127 12/15/20 0130 12/15/20 0145  BP: (!) 95/55  (!) 79/55 (!) 86/59  Pulse: 66 60 63 63  Resp: 18 15 (!) 22 17  Temp:      TempSrc:      SpO2: 96% 98% 94% 97%  Weight:      Height:       No intake or output data in the 24 hours ending 12/15/20 0305 Last 3 Weights 12/14/2020 12/14/2020 10/23/2020  Weight (lbs) 135 lb 135 lb 139 lb  Weight (kg) 61.236 kg 61.236 kg 63.05 kg     Body mass index is 24.69 kg/m.  General:  Well nourished, well developed, in no acute distress HEENT: normal Neck: no JVD Vascular: No carotid bruits; Distal pulses 2+ bilaterally   Cardiac:  normal S1, S2; RRR; 2/6 sys murmur at the base Lungs:  clear to auscultation bilaterally, no wheezing, rhonchi or rales  Abd: soft, nontender, no hepatomegaly  Ext: no edema Musculoskeletal:  No deformities Skin: warm and dry  Neuro:  no focal abnormalities noted Psych:  Normal affect    EKG:  The ECG that was done 12-14-20 was personally reviewed and demonstrates A-V pacing with diffuse TWI (similar to multiple prior tracings)  Relevant CV Studies: 08-27-19 TTE 1. LVOT peak  gradient 213 mmHg. Left ventricular ejection fraction, by  estimation, is 70 to 75%. The left ventricle has hyperdynamic function.  The left ventricle has no regional wall motion abnormalities. There is  mild concentric left ventricular  hypertrophy. Left ventricular diastolic parameters are consistent with  Grade I diastolic dysfunction (impaired relaxation). Elevated left atrial  pressure.   2. Right ventricular systolic function is normal. The right ventricular  size is normal. There is normal pulmonary artery systolic pressure.   3. The mitral valve is grossly normal. Trivial mitral valve  regurgitation.   4. The aortic valve is tricuspid. Aortic valve regurgitation is mild. No  aortic stenosis is present.   5. The inferior vena cava is normal in size with greater than 50%  respiratory variability, suggesting right atrial pressure of 3 mmHg.   07-25-16 nuc stress test shows no infarct or ischemia; LVEF 71%  Coronary CT angiogram performed in September 2020 showed a calcium score in the 85th percentile with no major coronary artery stenoses.  The coronary system is left dominant.   Laboratory Data:  High Sensitivity Troponin:   Recent Labs  Lab 12/14/20 2130 12/14/20 2345  TROPONINIHS 593* 502*      Chemistry Recent Labs  Lab 12/14/20 2130  NA 137  K 3.8  CL 105  CO2 24  GLUCOSE 110*  BUN 13  CREATININE 0.94  CALCIUM 9.7  GFRNONAA >60  ANIONGAP 8    No results for input(s): PROT, ALBUMIN, AST, ALT, ALKPHOS, BILITOT in the last 168 hours. Lipids No results for input(s): CHOL, TRIG, HDL, LABVLDL, LDLCALC, CHOLHDL in the last 168 hours. Hematology Recent Labs  Lab 12/14/20 2130  WBC 9.2  RBC 3.21*  HGB 10.2*  HCT 32.1*  MCV 100.0  MCH 31.8  MCHC 31.8  RDW 13.5  PLT 342   Thyroid No results for input(s): TSH, FREET4 in the last 168 hours. BNPNo results for input(s): BNP, PROBNP in the last  168 hours.  DDimer No results for input(s): DDIMER in the last 168  hours.   Radiology/Studies:  DG Chest 2 View  Result Date: 12/14/2020 CLINICAL DATA:  Chest pain EXAM: CHEST - 2 VIEW COMPARISON:  10/18/2020 FINDINGS: Left-sided pacing device. Mild cardiomegaly without focal opacity or pleural effusion. Mild diffuse interstitial opacity, possible low-grade edema. Probable underlying bronchitic changes. Aortic atherosclerosis IMPRESSION: Cardiomegaly with probable mild vascular congestion interstitial edema. Electronically Signed   By: Donavan Foil M.D.   On: 12/14/2020 22:12   CT Angio Chest Pulmonary Embolism (PE) W or WO Contrast  Result Date: 12/15/2020 CLINICAL DATA:  Palpitations with dizziness and lightheadedness. EXAM: CT ANGIOGRAPHY CHEST WITH CONTRAST TECHNIQUE: Multidetector CT imaging of the chest was performed using the standard protocol during bolus administration of intravenous contrast. Multiplanar CT image reconstructions and MIPs were obtained to evaluate the vascular anatomy. CONTRAST:  196m OMNIPAQUE IOHEXOL 350 MG/ML SOLN COMPARISON:  December 01, 2018 FINDINGS: Cardiovascular: Satisfactory opacification of the pulmonary arteries to the segmental level. No evidence of pulmonary embolism. There is mild cardiomegaly mild coronary artery calcification. No pericardial effusion. Mediastinum/Nodes: No enlarged mediastinal, hilar, or axillary lymph nodes. Peribronchial thickening is seen involving the bilateral mainstem bronchi and multiple proximal branches. The thyroid gland and esophagus demonstrate no significant findings. Lungs/Pleura: Markedly severity interstitial thickening is noted throughout both lungs. Mild to moderate severity atelectasis and/or infiltrate is also seen within the left upper lobe and bilateral lower lobes. There are small bilateral pleural effusions. No pneumothorax is identified. Upper Abdomen: A moderate to large hiatal hernia is seen. Musculoskeletal: Degenerative changes seen throughout the thoracic spine. Review of the MIP  images confirms the above findings. IMPRESSION: 1. No CT evidence of acute pulmonary embolism. 2. Markedly severity interstitial thickening throughout both lungs, consistent with pulmonary edema. 3. Mild to moderate severity left upper lobe and bilateral lower lobe atelectasis and/or infiltrate. 4. Small bilateral pleural effusions. 5. Moderate to large hiatal hernia. Electronically Signed   By: TVirgina NorfolkM.D.   On: 12/15/2020 02:37     Assessment and Plan:   Elevated HS troponin: this may be due to hypotension-induced demand ischemia superimposed on LVH and HCM, although plaque rupture ACS cannot be 100% excluded (less likely given unremarkable cCTA in 2020). Trop is already trending down. Pt has anemia, and is due to have a colonoscopy to look for possible source of bleed. I am going to stop the heparin gtt. Will cont to cycle troponins.  SOB/hypotension: these are probably hemodynamic issues related to the HOCM; pt has h/o resting LVOT gradient in the 50s at minimum, so it is not surprising that she would become easily more symptomatic if she becomes volume depleted for any reason. She has not had recent vomiting/diarrhea but states she has not been the best at keeping herself hydrated. Will aggressively hydrate and see if we can improve LV chamber filling and preload to improve her CO/BP. As above, plaque rupture cannot be excluded 100%. LHC might actually be helpful for two reasons; to assess the coronaries for more distal disease, and also to obtain more detailed hemodynamic measurements. Will leave pt NPO for now. Will also order repeat TTE.  HOCM: see #2 Anemia: colonoscopy pending for 9/22. Current H/H stable from recent PAF: hold eliquis in anticipation of possible LHC. Currently A-V paced Dyslipidemia: restart home regimen H/o HTN: will hold home regimen of BB/CCB until we get BP better-supported    Risk Assessment/Risk Scores:    TIMI Risk Score  for Unstable Angina or Non-ST  Elevation MI:   The patient's TIMI risk score is 3, which indicates a 13% risk of all cause mortality, new or recurrent myocardial infarction or need for urgent revascularization in the next 14 days.    CHA2DS2-VASc Score = 4   This indicates a 4.8% annual risk of stroke. The patient's score is based upon: CHF History: 0 HTN History: 1 Diabetes History: 0 Stroke History: 0 Vascular Disease History: 1 Age Score: 1 Gender Score: 1      For questions or updates, please contact [place] Please consult [email or web address] for contact info under     Signed, Rudean Curt, MD, [facility]  12/15/2020 3:05 AM

## 2020-12-15 NOTE — Telephone Encounter (Signed)
Colonoscopy cancelled for the time being. I will contact patient to discuss additional scheduling for office visit.

## 2020-12-15 NOTE — Interval H&P Note (Signed)
History and Physical Interval Note:  12/15/2020 11:40 AM  Alexandria French  has presented today for surgery, with the diagnosis of HOCM, hypotension, hypoxia.  The various methods of treatment have been discussed with the patient and family. After consideration of risks, benefits and other options for treatment, the patient has consented to  Procedure(s): RIGHT/LEFT HEART CATH AND CORONARY ANGIOGRAPHY (N/A) as a surgical intervention.  The patient's history has been reviewed, patient examined, no change in status, stable for surgery.  I have reviewed the patient's chart and labs.  Questions were answered to the patient's satisfaction.     Glenetta Hew

## 2020-12-15 NOTE — ED Notes (Signed)
Admitting paged to RN Mitzi Hansen per his request

## 2020-12-15 NOTE — ED Notes (Signed)
PT started sating in the mid to high 80s on 6LPM [place]. PT swapped to 10LPM NRB and is currently sating at 100. MD Joya Gaskins made aware

## 2020-12-15 NOTE — Progress Notes (Signed)
[facility] DEVICE PROGRAMMING  Patient Information: Name:  Alexandria French  DOB:  1950/04/21  MRN:  [id number]    Athena Masse, RN  [facility] Device Colonoscopy with Propofol  Surgeon:  Dr  Zenovia Jarred  Date of Procedure:  12/21/2020  Cautery will be used.  Position during surgery:   Please send documentation back to:  [facility]le (Fax # [phone])  [facility]      Device Information:  Clinic EP Physician:  Sanda Klein, MD  Device Type:  Pacemaker  [employer] [id number] ACCOLADE MRI EL Manufacturer and Phone #:  [employer]: [phone] Pacemaker Dependent?:  No.  Date of Last Device Check:  11/27/20 remote Normal Device Function?:  Yes.    Electrophysiologist's Recommendations:  Have magnet available. Provide continuous ECG monitoring when magnet is used or reprogramming is to be performed.  Procedure should not interfere with device function.  No device programming or magnet placement needed.  Per Device Clinic Standing Orders, Willadean Carol, [place]  6:58 AM 12/15/2020

## 2020-12-15 NOTE — H&P (View-Only) (Signed)
Cardiology Progress Note  [name] MRN: [id number] DOB: [birth year] Date of Encounter: 12/15/2020  Primary Cardiologist: Sanda Klein, MD  Subjective   Chief Complaint: Weakness/fatigue  HPI: Reports she is not feeling well.  CT scan with pulmonary edema versus pneumonia.  Blood pressures are low.  She reports weakness and fatigue over the last 24 hours.  Overall I suspect her troponin elevation is demand in the setting of hypertrophic cardiomyopathy and hypotension.  I have ordered sepsis labs.    ROS:  All other ROS reviewed and negative. Pertinent positives noted in the HPI.     Inpatient Medications  Scheduled Meds:  vitamin C  500 mg Oral Daily   [START ON 12/16/2020] aspirin EC  81 mg Oral Daily   calcium-vitamin D  1 tablet Oral BID   ferrous sulfate  325 mg Oral BID WC   loratadine  10 mg Oral Daily   LORazepam  0.5 mg Oral Q48H   simvastatin  20 mg Oral Daily   sodium chloride flush  3 mL Intravenous Q12H   Continuous Infusions:  PRN Meds: acetaminophen, nitroGLYCERIN, ondansetron (ZOFRAN) IV   Vital Signs   Vitals:   12/15/20 0530 12/15/20 0545 12/15/20 0700 12/15/20 0715  BP: 101/66 104/68 99/71 103/69  Pulse: 63 62 67 67  Resp: (!) 28 (!) 26 (!) 30 (!) 29  Temp:      TempSrc:      SpO2: 100% 100% 98% 100%  Weight:      Height:       No intake or output data in the 24 hours ending 12/15/20 0834 Last 3 Weights 12/14/2020 12/14/2020 10/23/2020  Weight (lbs) 135 lb 135 lb 139 lb  Weight (kg) 61.236 kg 61.236 kg 63.05 kg      Telemetry  Overnight telemetry shows sinus rhythm in the 70s, which I personally reviewed.   ECG  The most recent ECG shows sinus rhythm heart rate 66, left bundle branch block, diffuse T wave inversions inferolateral, which I personally reviewed.   Physical Exam   Vitals:   12/15/20 0530 12/15/20 0545 12/15/20 0700 12/15/20 0715  BP: 101/66 104/68 99/71 103/69  Pulse: 63 62 67 67  Resp: (!) 28 (!) 26 (!) 30  (!) 29  Temp:      TempSrc:      SpO2: 100% 100% 98% 100%  Weight:      Height:       No intake or output data in the 24 hours ending 12/15/20 0834  Last 3 Weights 12/14/2020 12/14/2020 10/23/2020  Weight (lbs) 135 lb 135 lb 139 lb  Weight (kg) 61.236 kg 61.236 kg 63.05 kg    Body mass index is 24.69 kg/m.   General: Ill-appearing Head: Atraumatic, normal size  Eyes: PEERLA, EOMI  Neck: Supple, no JVD Endocrine: No thryomegaly Cardiac: Normal S1, S2; RRR; 3 out of 6 systolic ejection murmur Lungs: Diminished breath sounds Abd: Soft, nontender, no hepatomegaly  Ext: No edema, pulses 2+ Musculoskeletal: No deformities, BUE and BLE strength normal and equal Skin: Warm and dry, no rashes   Neuro: Alert and oriented to person, place, time, and situation, CNII-XII grossly intact, no focal deficits  Psych: Normal mood and affect   Labs  High Sensitivity Troponin:   Recent Labs  Lab 12/14/20 2130 12/14/20 2345 12/15/20 0618  TROPONINIHS 593* 502* 601*     Cardiac EnzymesNo results for input(s): TROPONINI in the last 168 hours. No results for input(s): TROPIPOC in the last  168 hours.  Chemistry Recent Labs  Lab 12/14/20 2130 12/15/20 0618  NA 137 140  K 3.8 3.9  CL 105 111  CO2 24 22  GLUCOSE 110* 120*  BUN 13 10  CREATININE 0.94 0.81  CALCIUM 9.7 8.5*  GFRNONAA >60 >60  ANIONGAP 8 7    Hematology Recent Labs  Lab 12/14/20 2130 12/15/20 0618  WBC 9.2 8.8  RBC 3.21* 3.03*  HGB 10.2* 9.6*  HCT 32.1* 30.7*  MCV 100.0 101.3*  MCH 31.8 31.7  MCHC 31.8 31.3  RDW 13.5 13.3  PLT 342 288   BNPNo results for input(s): BNP, PROBNP in the last 168 hours.  DDimer No results for input(s): DDIMER in the last 168 hours.   Radiology  DG Chest 2 View  Result Date: 12/14/2020 CLINICAL DATA:  Chest pain EXAM: CHEST - 2 VIEW COMPARISON:  10/18/2020 FINDINGS: Left-sided pacing device. Mild cardiomegaly without focal opacity or pleural effusion. Mild diffuse interstitial  opacity, possible low-grade edema. Probable underlying bronchitic changes. Aortic atherosclerosis IMPRESSION: Cardiomegaly with probable mild vascular congestion interstitial edema. Electronically Signed   By: Donavan Foil M.D.   On: 12/14/2020 22:12   CT Angio Chest Pulmonary Embolism (PE) W or WO Contrast  Result Date: 12/15/2020 CLINICAL DATA:  Palpitations with dizziness and lightheadedness. EXAM: CT ANGIOGRAPHY CHEST WITH CONTRAST TECHNIQUE: Multidetector CT imaging of the chest was performed using the standard protocol during bolus administration of intravenous contrast. Multiplanar CT image reconstructions and MIPs were obtained to evaluate the vascular anatomy. CONTRAST:  180m OMNIPAQUE IOHEXOL 350 MG/ML SOLN COMPARISON:  December 01, 2018 FINDINGS: Cardiovascular: Satisfactory opacification of the pulmonary arteries to the segmental level. No evidence of pulmonary embolism. There is mild cardiomegaly mild coronary artery calcification. No pericardial effusion. Mediastinum/Nodes: No enlarged mediastinal, hilar, or axillary lymph nodes. Peribronchial thickening is seen involving the bilateral mainstem bronchi and multiple proximal branches. The thyroid gland and esophagus demonstrate no significant findings. Lungs/Pleura: Markedly severity interstitial thickening is noted throughout both lungs. Mild to moderate severity atelectasis and/or infiltrate is also seen within the left upper lobe and bilateral lower lobes. There are small bilateral pleural effusions. No pneumothorax is identified. Upper Abdomen: A moderate to large hiatal hernia is seen. Musculoskeletal: Degenerative changes seen throughout the thoracic spine. Review of the MIP images confirms the above findings. IMPRESSION: 1. No CT evidence of acute pulmonary embolism. 2. Markedly severity interstitial thickening throughout both lungs, consistent with pulmonary edema. 3. Mild to moderate severity left upper lobe and bilateral lower lobe  atelectasis and/or infiltrate. 4. Small bilateral pleural effusions. 5. Moderate to large hiatal hernia. Electronically Signed   By: TVirgina NorfolkM.D.   On: 12/15/2020 02:37    Cardiac Studies  TTE 08/27/2019 1. LVOT peak gradient 213 mmHg. Left ventricular ejection fraction, by  estimation, is 70 to 75%. The left ventricle has hyperdynamic function.  The left ventricle has no regional wall motion abnormalities. There is  mild concentric left ventricular  hypertrophy. Left ventricular diastolic parameters are consistent with  Grade I diastolic dysfunction (impaired relaxation). Elevated left atrial  pressure.   2. Right ventricular systolic function is normal. The right ventricular  size is normal. There is normal pulmonary artery systolic pressure.   3. The mitral valve is grossly normal. Trivial mitral valve  regurgitation.   4. The aortic valve is tricuspid. Aortic valve regurgitation is mild. No  aortic stenosis is present.   5. The inferior vena cava is normal in size with greater than  50%  respiratory variability, suggesting right atrial pressure of 3 mmHg.   Patient Profile  Alexandria French is a 71 y.o. female with paroxysmal atrial fibrillation on Eliquis, sick sinus syndrome status post pacemaker, hypertension, nonobstructive CAD who was admitted on 12/15/2020 with dizziness and shortness of breath and elevated troponin.  Assessment & Plan    #Dizziness #Shortness of breath #Pulmonary edema #Elevated troponin, likely demand ischemia in setting of hypertrophic or myopathy #Pneumonia? #Hypertension -She presents with vague symptoms for the last 24 hours.  Reports poor p.o. intake.  She reports has not been drinking much water.  She now presents with chest discomfort and shortness of breath.  Chest CT shows pulmonary edema but I am concerned for possible pneumonia. -Her troponin is elevated and rising.  Coronary CTA in 2020 with nonobstructive CAD.  She could be having demand  ischemia in setting hypertrophic cardiomyopathy as well as worsening obstruction.  She has a noticeable murmur. -I do not think this represents an acute coronary syndrome.  We will stop heparin. -Is a bit difficult to assess her hemodynamics.  I think she does merit left heart catheterization and we can perform a diagnostic coronary angiogram as well as pullback to determine LVOT gradient. -We have sent blood cultures, procalcitonin, lactic acid.  We will also send BNP.  We will hold on further intravenous fluids. -Last dose of Eliquis was yesterday morning.  She has been 24 hours without this.  I think cardiac cath is warranted.  We will also do right heart catheterization to assess hemodynamics. -Echo is pending.  On my review what was being done during the room it is significant for normal LV function.  There was difficulties in assessing LVOT gradients. -Hold home diltiazem and bisoprolol for now. -We will go ahead and start empiric antibiotics for pneumonia.  Further work-up is pending.  #Hypertrophic obstructive cardiomyopathy #Hypotension #pulmonary edema  -Significant LVOT gradient 1 year ago.  She is now here with hypotension and feeling poorly.  I suspect pneumonia could be playing here.  We do have plans to pursue left and right heart catheterization to better assess her hemodynamics. -Hold bisoprolol as well as diltiazem. -If hypotension worsens she may need phenylephrine. -No more intravenous fluids.  #Paroxysmal atrial fibrillation -Holding home Eliquis.  Left right heart catheterization today.  #Anemia -No signs of bleeding.  She is going to have an outpatient colonoscopy. -We will pursue diagnostic coronary angiogram with left and right heart catheterization to better assess hemodynamics.  This is for hokum. -I will not ask for any PCI to be performed.  I suspect this is all secondary.  FEN -no IVF -diet: NPO -code: full -dvt ppx: subcutaneous heparin   For questions or  updates, please contact [place] Please consult [email or web address] for contact info under   Time Spent with Patient: I have spent a total of 35 minutes with patient reviewing hospital notes, telemetry, EKGs, labs and examining the patient as well as establishing an assessment and plan that was discussed with the patient.  > 50% of time was spent in direct patient care.    Signed, Addison Naegeli. Audie Box, MD, [address]  12/15/2020 8:34 AM

## 2020-12-15 NOTE — Progress Notes (Signed)
Cath Lab staff to assess hypotension.  BPs were noted in the 80/50 range.  Saturations 92% on 5 L nasal cannula.  Right radial cath site was chosen for cardiac cath.  No obvious source of bleeding.  She reports she just feels poorly.  Infectious work-up has been negative.  See prior discussion regarding coronary evaluation that showed normal coronary arteries.  She does have elevated filling pressures on left side.    On review of record she has had vasodepressive responses for a long time.  This is not appear to be worsening hypertrophic cardiomyopathy.  The major issue right now is acute hypoxic respiratory failure with pulmonary edema.  Given her intermittent hypotension we will transfer her to the to heart ICU.  We will place her on a phenylephrine drip overnight.  After she is on phenylephrine we will start diuresis.  I suspect this will allow [place] to get fluid off.  She will remain on broad-spectrum antibiotics.  Blood cultures are pending.  It is reassuring that her procalcitonin is negative.  We will also check a random cortisol level just to make sure there is no evidence of adrenal insufficiency.  This really does not appear to be worsening hypertrophic cardiomyopathy.  We will continue with supportive care with phenylephrine and diuresis overnight while we search for infectious etiology.  I discussed her case briefly with advanced heart failure.  They have recommended midodrine once she is off phenylephrine.  We will see how she does.  She will be transferred to the to heart ICU overnight for further monitoring.  CRITICAL CARE Performed by: [place] Bells T O'Neal  Total critical care time: 45 minutes. Critical care time was exclusive of separately billable procedures and treating other patients. Critical care was necessary to treat or prevent imminent or life-threatening deterioration. Critical care was time spent personally by me on the following activities: development of treatment plan with patient  and/or surrogate as well as nursing, discussions with consultants, evaluation of patient's response to treatment, examination of patient, obtaining history from patient or surrogate, ordering and performing treatments and interventions, ordering and review of laboratory studies, ordering and review of radiographic studies, pulse oximetry and re-evaluation of patient's condition.  Signed, Addison Naegeli. Audie Box, MD, [place]  12/15/2020 3:11 PM

## 2020-12-15 NOTE — Telephone Encounter (Signed)
-----   Message from Jerene Bears, MD sent at 12/15/2020 12:13 PM EDT ----- Regarding: RE: FYI Thanks for the email Yes, definitely need to delay the colonoscopy She is admitted with plans for cardiac catheterization She needs to be scheduled for follow-up with an APP prior to rescheduling colonoscopy Thanks JMP   ----- Message ----- From: Darden Dates Sent: 12/15/2020   8:38 AM EDT To: Jerene Bears, MD Subject: Audie Pinto morning Dr. Hilarie Fredrickson, Just an FYI.  I was going to start the precert for this patient's colonoscopy for next Thursday, but just noticed she was in the ER for chest pain and is currently admitted.  I wanted to let you know in case she may need to be pushed out, and that would make a difference with her authorization.   Thanks, Amy

## 2020-12-16 DIAGNOSIS — I214 Non-ST elevation (NSTEMI) myocardial infarction: Secondary | ICD-10-CM | POA: Diagnosis not present

## 2020-12-16 DIAGNOSIS — J9690 Respiratory failure, unspecified, unspecified whether with hypoxia or hypercapnia: Secondary | ICD-10-CM

## 2020-12-16 LAB — CBC
HCT: 30.1 % — ABNORMAL LOW (ref 36.0–46.0)
Hemoglobin: 9.7 g/dL — ABNORMAL LOW (ref 12.0–15.0)
MCH: 31.6 pg (ref 26.0–34.0)
MCHC: 32.2 g/dL (ref 30.0–36.0)
MCV: 98 fL (ref 80.0–100.0)
Platelets: 314 10*3/uL (ref 150–400)
RBC: 3.07 MIL/uL — ABNORMAL LOW (ref 3.87–5.11)
RDW: 13.7 % (ref 11.5–15.5)
WBC: 12.8 10*3/uL — ABNORMAL HIGH (ref 4.0–10.5)
nRBC: 0 % (ref 0.0–0.2)

## 2020-12-16 LAB — BASIC METABOLIC PANEL
Anion gap: 10 (ref 5–15)
BUN: 9 mg/dL (ref 8–23)
CO2: 25 mmol/L (ref 22–32)
Calcium: 9.2 mg/dL (ref 8.9–10.3)
Chloride: 102 mmol/L (ref 98–111)
Creatinine, Ser: 0.93 mg/dL (ref 0.44–1.00)
GFR, Estimated: >60 mL/min (ref >60)
Glucose, Bld: 116 mg/dL — ABNORMAL HIGH (ref 70–99)
Potassium: 3.8 mmol/L (ref 3.5–5.1)
Sodium: 137 mmol/L (ref 135–145)

## 2020-12-16 LAB — MAGNESIUM: Magnesium: 1.7 mg/dL (ref 1.7–2.4)

## 2020-12-16 LAB — PROCALCITONIN: Procalcitonin: <0.1 ng/mL

## 2020-12-16 MED ORDER — MIDODRINE HCL 5 MG PO TABS
5.0000 mg | ORAL_TABLET | Freq: Three times a day (TID) | ORAL | Status: DC
  Administered 2020-12-17 – 2020-12-23 (×16): 5 mg via ORAL
  Filled 2020-12-16 (×17): qty 1

## 2020-12-16 MED ORDER — MAGNESIUM SULFATE 2 GM/50ML IV SOLN
2.0000 g | Freq: Once | INTRAVENOUS | Status: AC
  Administered 2020-12-16: 2 g via INTRAVENOUS
  Filled 2020-12-16: qty 50

## 2020-12-16 MED ORDER — MIDODRINE HCL 5 MG PO TABS
2.5000 mg | ORAL_TABLET | Freq: Once | ORAL | Status: AC
  Administered 2020-12-16: 2.5 mg via ORAL
  Filled 2020-12-16: qty 1

## 2020-12-16 MED ORDER — FUROSEMIDE 10 MG/ML IJ SOLN
40.0000 mg | Freq: Two times a day (BID) | INTRAMUSCULAR | Status: DC
  Administered 2020-12-16: 40 mg via INTRAVENOUS
  Filled 2020-12-16: qty 4

## 2020-12-16 MED ORDER — POTASSIUM CHLORIDE CRYS ER 20 MEQ PO TBCR
40.0000 meq | EXTENDED_RELEASE_TABLET | Freq: Once | ORAL | Status: AC
  Administered 2020-12-16: 40 meq via ORAL
  Filled 2020-12-16: qty 2

## 2020-12-16 MED ORDER — MIDODRINE HCL 5 MG PO TABS
2.5000 mg | ORAL_TABLET | Freq: Three times a day (TID) | ORAL | Status: DC
  Administered 2020-12-16: 2.5 mg via ORAL
  Filled 2020-12-16: qty 1

## 2020-12-16 MED ORDER — POTASSIUM CHLORIDE 20 MEQ PO PACK
40.0000 meq | PACK | Freq: Once | ORAL | Status: AC
  Administered 2020-12-16: 40 meq via ORAL
  Filled 2020-12-16: qty 2

## 2020-12-16 NOTE — Progress Notes (Signed)
Cardiology Progress Note  [name] MRN: [id number] DOB: November 06, 1950 Date of Encounter: 12/16/2020  Primary Cardiologist: Sanda Klein, MD  Subjective   Chief Complaint: Shortness of breath  HPI: Good diuresis overnight.  Satting 99% on 5 L.  Weaning down per nursing.  Negative work-up for hypertension.  Does not appear to be infectious.  This appears to be more of a chronic issue for her.  She is stable.  Needs further diuresis.  Started on midodrine.  Phenylephrine will be weaned off.  ROS:  All other ROS reviewed and negative. Pertinent positives noted in the HPI.     Inpatient Medications  Scheduled Meds:  apixaban  5 mg Oral BID   vitamin C  500 mg Oral Daily   aspirin EC  81 mg Oral Daily   calcium-vitamin D  1 tablet Oral BID   Chlorhexidine Gluconate Cloth  6 each Topical Daily   ferrous sulfate  325 mg Oral BID WC   furosemide  40 mg Intravenous BID   loratadine  10 mg Oral Daily   LORazepam  0.5 mg Oral Q48H   mouth rinse  15 mL Mouth Rinse BID   melatonin  3 mg Oral QHS   midodrine  2.5 mg Oral TID WC   simvastatin  20 mg Oral Daily   sodium chloride flush  3 mL Intravenous Q12H   sodium chloride flush  3 mL Intravenous Q12H   Continuous Infusions:  sodium chloride     PRN Meds: sodium chloride, acetaminophen, ondansetron (ZOFRAN) IV, sodium chloride flush, sodium chloride flush   Vital Signs   Vitals:   12/16/20 0615 12/16/20 0630 12/16/20 0645 12/16/20 0700  BP: (!) 81/57 (!) 86/44 101/76 103/69  Pulse: 82 93 90 94  Resp: (!) 22 (!) 24 (!) 29 (!) 22  Temp:      TempSrc:      SpO2: 97% 96% 98% 99%  Weight:      Height:        Intake/Output Summary (Last 24 hours) at 12/16/2020 0734 Last data filed at 12/16/2020 0700 Gross per 24 hour  Intake 1044.96 ml  Output 1300 ml  Net -255.04 ml   Last 3 Weights 12/15/2020 12/14/2020 12/14/2020  Weight (lbs) 153 lb 10.6 oz 135 lb 135 lb  Weight (kg) 69.7 kg 61.236 kg 61.236 kg       Telemetry  Overnight telemetry shows sinus rhythm in the 90s with intermittent V pacing, which I personally reviewed.    Physical Exam   Vitals:   12/16/20 0615 12/16/20 0630 12/16/20 0645 12/16/20 0700  BP: (!) 81/57 (!) 86/44 101/76 103/69  Pulse: 82 93 90 94  Resp: (!) 22 (!) 24 (!) 29 (!) 22  Temp:      TempSrc:      SpO2: 97% 96% 98% 99%  Weight:      Height:        Intake/Output Summary (Last 24 hours) at 12/16/2020 0734 Last data filed at 12/16/2020 0700 Gross per 24 hour  Intake 1044.96 ml  Output 1300 ml  Net -255.04 ml    Last 3 Weights 12/15/2020 12/14/2020 12/14/2020  Weight (lbs) 153 lb 10.6 oz 135 lb 135 lb  Weight (kg) 69.7 kg 61.236 kg 61.236 kg    Body mass index is 28.1 kg/m.   General: Well nourished, well developed, in no acute distress Head: Atraumatic, normal size  Eyes: PEERLA, EOMI  Neck: Supple, JVD 7 to 8 cm of water Endocrine:  No thryomegaly Cardiac: Normal S1, S2; RRR; 2 out of 6 systolic ejection murmur Lungs: Crackles at the lung bases Abd: Soft, nontender, no hepatomegaly  Ext: No edema, pulses 2+ Musculoskeletal: No deformities, BUE and BLE strength normal and equal Skin: Warm and dry, no rashes   Neuro: Alert and oriented to person, place, time, and situation, CNII-XII grossly intact, no focal deficits  Psych: Normal mood and affect   Labs  High Sensitivity Troponin:   Recent Labs  Lab 12/14/20 2130 12/14/20 2345 12/15/20 0618 12/15/20 0817  TROPONINIHS 593* 502* 601* 652*     Cardiac EnzymesNo results for input(s): TROPONINI in the last 168 hours. No results for input(s): TROPIPOC in the last 168 hours.  Chemistry Recent Labs  Lab 12/14/20 2130 12/15/20 0618 12/15/20 0952 12/15/20 1206 12/15/20 1214 12/16/20 0127  NA 137 140  --  141 142  142 137  K 3.8 3.9  --  4.0 3.9  3.8 3.8  CL 105 111  --   --   --  102  CO2 24 22  --   --   --  25  GLUCOSE 110* 120*  --   --   --  116*  BUN 13 10  --   --   --  9   CREATININE 0.94 0.81 0.87  --   --  0.93  CALCIUM 9.7 8.5*  --   --   --  9.2  GFRNONAA >60 >60 >60  --   --  >60  ANIONGAP 8 7  --   --   --  10    Hematology Recent Labs  Lab 12/15/20 0952 12/15/20 1206 12/15/20 1214 12/15/20 1601 12/16/20 0127  WBC 9.6  --   --  12.2* 12.8*  RBC 3.25*  --   --  3.24* 3.07*  HGB 10.3*   < > 9.5*  9.2* 10.3* 9.7*  HCT 32.4*   < > 28.0*  27.0* 32.0* 30.1*  MCV 99.7  --   --  98.8 98.0  MCH 31.7  --   --  31.8 31.6  MCHC 31.8  --   --  32.2 32.2  RDW 13.6  --   --  13.5 13.7  PLT 303  --   --  288 314   < > = values in this interval not displayed.   BNP Recent Labs  Lab 12/15/20 0817  BNP 1,765.0*    DDimer No results for input(s): DDIMER in the last 168 hours.   Radiology  DG Chest 2 View  Result Date: 12/14/2020 CLINICAL DATA:  Chest pain EXAM: CHEST - 2 VIEW COMPARISON:  10/18/2020 FINDINGS: Left-sided pacing device. Mild cardiomegaly without focal opacity or pleural effusion. Mild diffuse interstitial opacity, possible low-grade edema. Probable underlying bronchitic changes. Aortic atherosclerosis IMPRESSION: Cardiomegaly with probable mild vascular congestion interstitial edema. Electronically Signed   By: Donavan Foil M.D.   On: 12/14/2020 22:12   CT Angio Chest Pulmonary Embolism (PE) W or WO Contrast  Result Date: 12/15/2020 CLINICAL DATA:  Palpitations with dizziness and lightheadedness. EXAM: CT ANGIOGRAPHY CHEST WITH CONTRAST TECHNIQUE: Multidetector CT imaging of the chest was performed using the standard protocol during bolus administration of intravenous contrast. Multiplanar CT image reconstructions and MIPs were obtained to evaluate the vascular anatomy. CONTRAST:  144m OMNIPAQUE IOHEXOL 350 MG/ML SOLN COMPARISON:  December 01, 2018 FINDINGS: Cardiovascular: Satisfactory opacification of the pulmonary arteries to the segmental level. No evidence of pulmonary embolism. There is mild cardiomegaly mild  coronary artery  calcification. No pericardial effusion. Mediastinum/Nodes: No enlarged mediastinal, hilar, or axillary lymph nodes. Peribronchial thickening is seen involving the bilateral mainstem bronchi and multiple proximal branches. The thyroid gland and esophagus demonstrate no significant findings. Lungs/Pleura: Markedly severity interstitial thickening is noted throughout both lungs. Mild to moderate severity atelectasis and/or infiltrate is also seen within the left upper lobe and bilateral lower lobes. There are small bilateral pleural effusions. No pneumothorax is identified. Upper Abdomen: A moderate to large hiatal hernia is seen. Musculoskeletal: Degenerative changes seen throughout the thoracic spine. Review of the MIP images confirms the above findings. IMPRESSION: 1. No CT evidence of acute pulmonary embolism. 2. Markedly severity interstitial thickening throughout both lungs, consistent with pulmonary edema. 3. Mild to moderate severity left upper lobe and bilateral lower lobe atelectasis and/or infiltrate. 4. Small bilateral pleural effusions. 5. Moderate to large hiatal hernia. Electronically Signed   By: Virgina Norfolk M.D.   On: 12/15/2020 02:37   CARDIAC CATHETERIZATION  Result Date: 123XX123   LV end diastolic pressure is severely elevated.   Hemodynamic findings consistent with mild pulmonary hypertension. SUMMARY Angiographically Normal Coronary arteries with a left dominant system. Significant LVOT gradient with apical LV pressures of 184/12 mmHg with an EDP of 25, mid LV cavity 157/9 mmHg with EDP of 25 and outflow tract pressure of 111/9 mmHg with a stable EDP of 24 mmHg. Mild Secondary Pulmonary Hypertension with mean PAP of 29 mmHg and PCWP of 29 mmHg. Moderately reduced cardiac function with a cardiac output-index of 3.26 and 2.02 by Fick and 3.55-2.2 by thermodilution. RECOMMENDATIONS Gentle titration of medications to allow for diuresis without leading to systemic hypotension. Defer  decision making to rounding cardiology service. Glenetta Hew, MD  ECHOCARDIOGRAM COMPLETE  Result Date: 12/15/2020    ECHOCARDIOGRAM REPORT   Patient Name:   DINEEN TROJANOWSKI Date of Exam: 12/15/2020 Medical Rec #:  [id number]     Height:       62.0 in Accession #:    [id number]    Weight:       135.0 lb Date of Birth:  12/01/1950    BSA:          1.618 m Patient Age:    9 years      BP:           108/56 mmHg Patient Gender: F             HR:           76 bpm. Exam Location:  Inpatient Procedure: 2D Echo, Cardiac Doppler and Color Doppler                                MODIFIED REPORT:     This report was modified by Rudean Haskell MD on 12/15/2020 due to                                    Spelling.  Indications:     Acute MI  History:         Patient has prior history of Echocardiogram examinations, most                  recent 08/27/2019. Acute MI, Arrythmias:LBBB; Risk                  Factors:Hypertension.  Sonographer:  MH Referring Phys:  [id number] Stevan Born MARTIN Diagnosing Phys: Rudean Haskell MD IMPRESSIONS  1. Left ventricular ejection fraction, by estimation, is 55-60%. The left ventricle demonstrates regional wall motion abnormalities that are consistent with a left bundle branch block.There is severe asymmetric left ventricular hypertrophy. Left ventricular diastolic parameters are consistent with Grade I diastolic dysfunction (impaired relaxation). No significant LVOT Obstructive seen in this study.  2. Right ventricular systolic function is normal. The right ventricular size is normal. There is normal pulmonary artery systolic pressure.  3. The mitral valve is normal in structure. Mild mitral valve regurgitation. No evidence of mitral stenosis.  4. The aortic valve was not well visualized. Aortic valve regurgitation is trivial. No aortic stenosis is present.  5. The inferior vena cava is normal in size with <50% respiratory variability, suggesting right atrial pressure of 8 mmHg.   6. Technically difficult acquisition; in future studies consider use of echo-contrast. Comparison(s): A prior study was performed on 08/27/19. Prior images reviewed side by side. Compared to prior septal wall motion is new. LVOT obstructive more prominent on prior study. Conclusion(s)/Recommendation(s): Consider cardiac MRI for assessment of hypertrophic cardioymyopathy. FINDINGS  Left Ventricle: Left ventricular ejection fraction, by estimation, is 55 to 60%. The left ventricle has normal function. The left ventricle demonstrates regional wall motion abnormalities. The left ventricular internal cavity size was small. There is severe asymmetric left ventricular hypertrophy. Abnormal (paradoxical) septal motion, consistent with left bundle branch block. Left ventricular diastolic parameters are consistent with Grade I diastolic dysfunction (impaired relaxation). Right Ventricle: The right ventricular size is normal. Right vetricular wall thickness was not well visualized. Right ventricular systolic function is normal. There is normal pulmonary artery systolic pressure. The tricuspid regurgitant velocity is 1.60 m/s, and with an assumed right atrial pressure of 8 mmHg, the estimated right ventricular systolic pressure is 123XX123 mmHg. Left Atrium: Left atrial size was normal in size. Right Atrium: Right atrial size was not well visualized. Pericardium: There is no evidence of pericardial effusion. Mitral Valve: The mitral valve is normal in structure. Mild mitral valve regurgitation. No evidence of mitral valve stenosis. Tricuspid Valve: The tricuspid valve is grossly normal. Tricuspid valve regurgitation is trivial. No evidence of tricuspid stenosis. Aortic Valve: The aortic valve was not well visualized. Aortic valve regurgitation is trivial. No aortic stenosis is present. Aortic valve mean gradient measures 3.5 mmHg. Aortic valve peak gradient measures 13.7 mmHg. Aortic valve area, by VTI measures 2.92 cm. Pulmonic  Valve: The pulmonic valve was not well visualized. Pulmonic valve regurgitation is not visualized. Aorta: The aortic root and ascending aorta are structurally normal, with no evidence of dilitation. Venous: The inferior vena cava is normal in size with less than 50% respiratory variability, suggesting right atrial pressure of 8 mmHg. IAS/Shunts: The atrial septum is grossly normal.  LEFT VENTRICLE PLAX 2D LVIDd:         3.70 cm     Diastology LVIDs:         2.40 cm     LV e' medial:  7.07 cm/s LV PW:         1.80 cm     LV e' lateral: 7.18 cm/s LV IVS:        1.50 cm LVOT diam:     1.90 cm LV SV:         217 LV SV Index:   134 LVOT Area:     2.84 cm  LV Volumes (MOD) LV vol d, MOD A4C: 25.7  ml LV vol s, MOD A4C: 7.6 ml LV SV MOD A4C:     25.7 ml RIGHT VENTRICLE RV S prime:     6.20 cm/s TAPSE (M-mode): 2.0 cm LEFT ATRIUM           Index LA diam:      3.50 cm 2.16 cm/m LA Vol (A2C): 22.2 ml 13.72 ml/m LA Vol (A4C): 35.1 ml 21.70 ml/m  AORTIC VALVE AV Area (Vmax):    3.77 cm AV Area (Vmean):   2.70 cm AV Area (VTI):     2.92 cm AV Vmax:           185.00 cm/s AV Vmean:          195.400 cm/s AV VTI:            0.743 m AV Peak Grad:      13.7 mmHg AV Mean Grad:      3.5 mmHg LVOT Vmax:         246.00 cm/s LVOT Vmean:        186.000 cm/s LVOT VTI:          0.764 m LVOT/AV VTI ratio: 1.03  AORTA Ao Root diam: 3.00 cm Ao Asc diam:  2.70 cm MR Peak grad: 47.3 mmHg   TRICUSPID VALVE MR Vmax:      344.00 cm/s TR Peak grad:   10.2 mmHg                           TR Vmax:        160.00 cm/s                            SHUNTS                           Systemic VTI:  0.76 m                           Systemic Diam: 1.90 cm Rudean Haskell MD Electronically signed by Rudean Haskell MD Signature Date/Time: 12/15/2020/12:52:48 PM    Final (Updated)     Cardiac Studies  [facility] 12/15/2020 SUMMARY Angiographically Normal Coronary arteries with a left dominant system. Significant LVOT gradient with apical LV pressures of  184/12 mmHg with an EDP of 25, mid LV cavity 157/9 mmHg with EDP of 25 and outflow tract pressure of 111/9 mmHg with a stable EDP of 24 mmHg. Mild Secondary Pulmonary Hypertension with mean PAP of 29 mmHg and PCWP of 29 mmHg. Moderately reduced cardiac function with a cardiac output-index of 3.26 and 2.02 by Fick and 3.55-2.2 by thermodilution.     RECOMMENDATIONS Gentle titration of medications to allow for diuresis without leading to systemic hypotension. Defer decision making to rounding cardiology service.  TTE 12/15/2020  1. Left ventricular ejection fraction, by estimation, is 55-60%. The left  ventricle demonstrates regional wall motion abnormalities that are  consistent with a left bundle branch block.There is severe asymmetric left  ventricular hypertrophy. Left  ventricular diastolic parameters are consistent with Grade I diastolic  dysfunction (impaired relaxation). No significant LVOT Obstructive seen in  this study.   2. Right ventricular systolic function is normal. The right ventricular  size is normal. There is normal pulmonary artery systolic pressure.   3. The mitral valve is normal in structure. Mild mitral valve  regurgitation. No  evidence of mitral stenosis.   4. The aortic valve was not well visualized. Aortic valve regurgitation  is trivial. No aortic stenosis is present.   5. The inferior vena cava is normal in size with <50% respiratory  variability, suggesting right atrial pressure of 8 mmHg.   6. Technically difficult acquisition; in future studies consider use of  echo-contrast.   Patient Profile  Alexandria French is a 70 y.o. female with hypertrophic obstructive cardiomyopathy, paroxysmal atrial fibrillation on Eliquis, sick sinus syndrome status post pacemaker, hypertension, nonobstructive CAD who was admitted on 12/15/2020 with dizziness acute hypoxic respiratory failure secondary to decompensated diastolic heart failure.  Assessment & Plan   #Acute hypoxic  respiratory failure #HFpEF #Elevated troponin, demand ischemia #Hypertension -Admitted with dizziness and fatigue.  Found to be hypotensive in the ER.  She was given intravenous fluids and was found to have pulmonary edema on chest CT. -She was hypotensive but this appears to be a chronic issue for her.  See discussion below. -She underwent left heart cath yesterday which showed no evidence of coronary disease.  Also had elevated LVEDP.  Elevated wedge pressure.  Placed in the ICU due to hypotension overnight on phenylephrine drip.  She underwent diuresis and oxygen saturations are improving. -Troponin elevation is secondary to hypertrophic cardiomyopathy.  Does not appear to be related to obstructive coronary disease as her angiography was normal.  Suspect there is likely small vessel disease. -For now we will turn off phenylephrine.  We will start midodrine 2.5 mg 3 times daily.  We will continue with Lasix 40 mg IV twice daily.  We will replace her potassium as we are able. -We will hold her home diltiazem and bisoprolol for now.  We will likely restart these tomorrow. -I have stopped antibiotics.  Procalcitonin is negative.  Lactic acid is normal.  To me this does not represent infection or sepsis.  #Hypertrophic obstructive cardiomyopathy #Hypotension #Pulmonary edema -Poor interrogation of LVOT gradient on echo.  Left heart cath shows peak peak gradient of 70 mmHg. -Her overall picture is not explained by decompensated hokum.  I believe this is just diastolic heart failure and she has chronic hypotension. -Add midodrine.  Continue with diuresis.  We will add back beta-blocker or calcium channel blocker as we are able tomorrow. -I do wonder if this is neurogenic orthostatic hypotension.  I really have no cardiac etiology for this.  She is ruled out for any infectious source.  Antibiotics have been stopped.  #Paroxysmal A. Fib -Maintaining sinus rhythm. -Eliquis 5 mg twice daily  restarted.  #Anemia -No signs of bleeding.  Hemoglobin stable.  She has planned outpatient colonoscopy later this month. -No contraindications anticoagulation.  FEN -No intravenous fluids - Diet: Heart healthy - Code: Full - DVT PPx: Eliquis   For questions or updates, please contact [place] Please consult [email or web address] for contact info under   Time Spent with Patient: I have spent a total of 35 minutes with patient reviewing hospital notes, telemetry, EKGs, labs and examining the patient as well as establishing an assessment and plan that was discussed with the patient.  > 50% of time was spent in direct patient care.    Signed, Addison Naegeli. Audie Box, MD, [place]  12/16/2020 7:34 AM

## 2020-12-17 ENCOUNTER — Inpatient Hospital Stay (HOSPITAL_COMMUNITY): Payer: Medicare PPO

## 2020-12-17 DIAGNOSIS — I214 Non-ST elevation (NSTEMI) myocardial infarction: Secondary | ICD-10-CM | POA: Diagnosis not present

## 2020-12-17 LAB — CBC
HCT: 27.3 % — ABNORMAL LOW (ref 36.0–46.0)
Hemoglobin: 8.5 g/dL — ABNORMAL LOW (ref 12.0–15.0)
MCH: 30.9 pg (ref 26.0–34.0)
MCHC: 31.1 g/dL (ref 30.0–36.0)
MCV: 99.3 fL (ref 80.0–100.0)
Platelets: 220 10*3/uL (ref 150–400)
RBC: 2.75 MIL/uL — ABNORMAL LOW (ref 3.87–5.11)
RDW: 13.7 % (ref 11.5–15.5)
WBC: 10 10*3/uL (ref 4.0–10.5)
nRBC: 0 % (ref 0.0–0.2)

## 2020-12-17 LAB — BASIC METABOLIC PANEL
Anion gap: 6 (ref 5–15)
BUN: 11 mg/dL (ref 8–23)
CO2: 28 mmol/L (ref 22–32)
Calcium: 8.9 mg/dL (ref 8.9–10.3)
Chloride: 104 mmol/L (ref 98–111)
Creatinine, Ser: 0.93 mg/dL (ref 0.44–1.00)
GFR, Estimated: >60 mL/min (ref >60)
Glucose, Bld: 112 mg/dL — ABNORMAL HIGH (ref 70–99)
Potassium: 4.2 mmol/L (ref 3.5–5.1)
Sodium: 138 mmol/L (ref 135–145)

## 2020-12-17 LAB — MAGNESIUM: Magnesium: 2 mg/dL (ref 1.7–2.4)

## 2020-12-17 LAB — PROCALCITONIN: Procalcitonin: <0.1 ng/mL

## 2020-12-17 MED ORDER — FUROSEMIDE 10 MG/ML IJ SOLN
60.0000 mg | Freq: Two times a day (BID) | INTRAMUSCULAR | Status: DC
  Filled 2020-12-17: qty 6

## 2020-12-17 MED ORDER — LEVOFLOXACIN 500 MG PO TABS
500.0000 mg | ORAL_TABLET | Freq: Every day | ORAL | Status: DC
  Administered 2020-12-17 – 2020-12-18 (×2): 500 mg via ORAL
  Filled 2020-12-17 (×2): qty 1

## 2020-12-17 MED ORDER — FUROSEMIDE 10 MG/ML IJ SOLN
20.0000 mg | Freq: Once | INTRAMUSCULAR | Status: AC
  Administered 2020-12-17: 20 mg via INTRAVENOUS

## 2020-12-17 MED ORDER — MAGNESIUM SULFATE 2 GM/50ML IV SOLN
2.0000 g | Freq: Once | INTRAVENOUS | Status: AC
  Administered 2020-12-17: 2 g via INTRAVENOUS
  Filled 2020-12-17: qty 50

## 2020-12-17 MED ORDER — FUROSEMIDE 10 MG/ML IJ SOLN
80.0000 mg | Freq: Two times a day (BID) | INTRAMUSCULAR | Status: DC
  Administered 2020-12-17 – 2020-12-19 (×5): 80 mg via INTRAVENOUS
  Filled 2020-12-17 (×5): qty 8

## 2020-12-17 MED ORDER — METOPROLOL TARTRATE 25 MG PO TABS
25.0000 mg | ORAL_TABLET | Freq: Two times a day (BID) | ORAL | Status: DC
  Administered 2020-12-17 – 2020-12-20 (×7): 25 mg via ORAL
  Filled 2020-12-17 (×9): qty 1

## 2020-12-17 NOTE — Evaluation (Signed)
Physical Therapy Evaluation Patient Details Name: Alexandria French MRN: [id number] DOB: May 02, 1950 Today's Date: 12/17/2020  History of Present Illness  Pt is a 70 y.o. female who presented to the ED at [facility] on 12/14/20 with new-onset palpitations, SOB, and dizziness. Pt found to be hypotensive and admitted with acute hypoxic respiratory failure secondary to decompensated diastolic heart failure. S/p R/L heart cath and coronary angiography 9/16. PMH: hypertrophic obstructive cardiomyopathy, paroxysmal atrial fibrillation on Eliquis, sick sinus syndrome status post pacemaker, hypertension, nonobstructive CAD, diverticulosis   Clinical Impression  Pt presents with condition above and deficits mentioned below, see PT Problem List. PTA, she was independent living with her significant other in a 1-level house with 2 STE without rails. Currently, pt displays deficits in balance, likely due to her BP and SpO2 levels decreasing with activity and changes in position, see below. Pt would require up to minA intermittently to recover with bouts of LOB when ambulating and when descending stairs without UE support. I expect her balance will return to baseline once her vitals improve. Will continue to follow acutely to maximize her safety and independence to go home.   SpO2: decreased to 84% when ambulating on RA, provided 2L of O2 via Alexandria French with rebound to 91%  BP:  121/77 supine 110/80 sitting 113/72 standing 90/65 standing ~3 minutes 100/63 sitting after ambulating      Recommendations for follow up therapy are one component of a multi-disciplinary discharge planning process, led by the attending physician.  Recommendations may be updated based on patient status, additional functional criteria and insurance authorization.  Follow Up Recommendations No PT follow up;Supervision for mobility/OOB    Equipment Recommendations  None recommended by PT    Recommendations for Other Services       Precautions /  Restrictions Precautions Precautions: Fall Precaution Comments: monitor BP/vitals Restrictions Weight Bearing Restrictions: No      Mobility  Bed Mobility Overal bed mobility: Modified Independent             General bed mobility comments: Pt able to transition supine > sit EOB with HOB elevated without assistance.    Transfers Overall transfer level: Independent Equipment used: None             General transfer comment: Pt able to transfer to stand multiple times without LOB.  Ambulation/Gait Ambulation/Gait assistance: Min guard;Min assist Gait Distance (Feet): 140 Feet (x3 bouts of ~125 ft > ~70 ft > ~140 ft) Assistive device: None Gait Pattern/deviations: Step-through pattern;Decreased stride length;Narrow base of support Gait velocity: reduced Gait velocity interpretation: 1.31 - 2.62 ft/sec, indicative of limited community ambulator General Gait Details: Pt ambulated initially with fairly good stability and no LOB, but as she fatigued or her SpO2 decreased to 84% on RA she would display narrow placement of feet causing bouts of staggering/LOB needing up to minA to recover. x2 seated rest breaks.  Stairs Stairs: Yes Stairs assistance: Min guard;Min assist Stair Management: No rails;Alternating pattern;Forwards Number of Stairs: 4 General stair comments: Ascends with min guard but needs up to minA to recover LOB when descending when navigating stairs without UE support with reciprocal pattern.  Wheelchair Mobility    Modified Rankin (Stroke Patients Only)       Balance Overall balance assessment: Mild deficits observed, not formally tested  Pertinent Vitals/Pain Pain Assessment: No/denies pain    Home Living Family/patient expects to be discharged to:: Private residence Living Arrangements: Spouse/significant other Available Help at Discharge: Family;Available 24 hours/day Type of Home:  House Home Access: Stairs to enter Entrance Stairs-Rails: None Entrance Stairs-Number of Steps: 2 Home Layout: One level Home Equipment: Walker - 2 wheels;Cane - quad;Bedside commode      Prior Function Level of Independence: Independent         Comments: Pt drives.     Hand Dominance   Dominant Hand: Right    Extremity/Trunk Assessment   Upper Extremity Assessment Upper Extremity Assessment: Overall WFL for tasks assessed    Lower Extremity Assessment Lower Extremity Assessment: Overall WFL for tasks assessed (MMT scores of 4+ to 5 grossly bil)    Cervical / Trunk Assessment Cervical / Trunk Assessment: Normal  Communication   Communication: No difficulties  Cognition Arousal/Alertness: Awake/alert Behavior During Therapy: WFL for tasks assessed/performed Overall Cognitive Status: Within Functional Limits for tasks assessed                                 General Comments: Pt reports some minor STM loss at baseline.      General Comments General comments (skin integrity, edema, etc.): SpO2 decreased to 84% when ambulating on RA, provided 2L of O2 via [name] with rebound to 91%; BP 121/77 supine, 110/80 sitting, 113/72 standing, 90/65 standing ~3 minutes, 100/63 sitting after ambulating    Exercises     Assessment/Plan    PT Assessment Patient needs continued PT services  PT Problem List Decreased activity tolerance;Decreased balance;Decreased mobility;Cardiopulmonary status limiting activity       PT Treatment Interventions DME instruction;Gait training;Stair training;Functional mobility training;Therapeutic activities;Therapeutic exercise;Balance training;Neuromuscular re-education;Patient/family education    PT Goals (Current goals can be found in the Care Plan section)  Acute Rehab PT Goals Patient Stated Goal: to get better PT Goal Formulation: With patient Time For Goal Achievement: 12/24/20 Potential to Achieve Goals: Good     Frequency Min 3X/week   Barriers to discharge        Co-evaluation               AM-PAC PT "6 Clicks" Mobility  Outcome Measure Help needed turning from your back to your side while in a flat bed without using bedrails?: None Help needed moving from lying on your back to sitting on the side of a flat bed without using bedrails?: None Help needed moving to and from a bed to a chair (including a wheelchair)?: None Help needed standing up from a chair using your arms (e.g., wheelchair or bedside chair)?: None Help needed to walk in hospital room?: A Little Help needed climbing 3-5 steps with a railing? : A Little 6 Click Score: 22    End of Session Equipment Utilized During Treatment: Oxygen Activity Tolerance: Patient limited by fatigue;Patient tolerated treatment well Patient left: in chair;with call bell/phone within reach Nurse Communication: Mobility status PT Visit Diagnosis: Unsteadiness on feet (R26.81);Other abnormalities of gait and mobility (R26.89);Difficulty in walking, not elsewhere classified (R26.2);Dizziness and giddiness (R42)    Time: [id number] PT Time Calculation (min) (ACUTE ONLY): 25 min   Charges:   PT Evaluation $PT Eval Moderate Complexity: 1 Mod PT Treatments $Gait Training: 8-22 mins        Moishe Spice, PT, DPT Acute Rehabilitation Services  Pager: [phone] Office: [phone]   Orvan Falconer 12/17/2020,  11:48 AM

## 2020-12-17 NOTE — Progress Notes (Signed)
SATURATION QUALIFICATIONS: (This note is used to comply with regulatory documentation for home oxygen)  Patient Saturations on Room Air at Rest = 99%  Patient Saturations on Room Air while Ambulating = 84%  Patient Saturations on 2 Liters of oxygen while Ambulating = 91%  Please briefly explain why patient needs home oxygen: Pt's sats decrease to as low as 84% and she becomes lightheaded/dizzy when ambulating on room air, but her sats improve with supplemental O2.   Moishe Spice, PT, DPT Acute Rehabilitation Services  Pager: [phone] Office: [phone]

## 2020-12-17 NOTE — Progress Notes (Signed)
Cardiology Progress Note  [name] MRN: [id number] DOB: 01/04/1951 Date of Encounter: 12/17/2020  Primary Cardiologist: Sanda Klein, MD  Subjective   Chief Complaint: SOB  HPI: Still short of breath.  Blood pressure more stable.  Remains on midodrine.  Compression stockings have been ordered.  ROS:  All other ROS reviewed and negative. Pertinent positives noted in the HPI.     Inpatient Medications  Scheduled Meds:  apixaban  5 mg Oral BID   vitamin C  500 mg Oral Daily   calcium-vitamin D  1 tablet Oral BID   Chlorhexidine Gluconate Cloth  6 each Topical Daily   ferrous sulfate  325 mg Oral BID WC   furosemide  20 mg Intravenous Once   furosemide  60 mg Intravenous BID   loratadine  10 mg Oral Daily   LORazepam  0.5 mg Oral Q48H   mouth rinse  15 mL Mouth Rinse BID   melatonin  3 mg Oral QHS   metoprolol tartrate  25 mg Oral BID   midodrine  5 mg Oral TID WC   simvastatin  20 mg Oral Daily   sodium chloride flush  3 mL Intravenous Q12H   sodium chloride flush  3 mL Intravenous Q12H   Continuous Infusions:  sodium chloride     PRN Meds: sodium chloride, acetaminophen, ondansetron (ZOFRAN) IV, sodium chloride flush, sodium chloride flush   Vital Signs   Vitals:   12/16/20 1507 12/16/20 1603 12/16/20 1958 12/17/20 0526  BP:  95/61 95/63 106/71  Pulse: 86   96  Resp: (!) 28 (!) '23 18 18  '$ Temp:  98.3 F (36.8 C) 99.4 F (37.4 C) 98.4 F (36.9 C)  TempSrc:  Oral Oral Oral  SpO2: 100%   96%  Weight:      Height:        Intake/Output Summary (Last 24 hours) at 12/17/2020 0818 Last data filed at 12/16/2020 2137 Gross per 24 hour  Intake 382.26 ml  Output --  Net 382.26 ml   Last 3 Weights 12/15/2020 12/14/2020 12/14/2020  Weight (lbs) 153 lb 10.6 oz 135 lb 135 lb  Weight (kg) 69.7 kg 61.236 kg 61.236 kg      Telemetry  Overnight telemetry shows sinus rhythm with intermittent V pacing, 12 seconds of nonsustained VT overnight, which I  personally reviewed.   ECG  The most recent ECG shows normal sinus rhythm heart rate 92, left bundle branch block, which I personally reviewed.   Physical Exam   Vitals:   12/16/20 1507 12/16/20 1603 12/16/20 1958 12/17/20 0526  BP:  95/61 95/63 106/71  Pulse: 86   96  Resp: (!) 28 (!) '23 18 18  '$ Temp:  98.3 F (36.8 C) 99.4 F (37.4 C) 98.4 F (36.9 C)  TempSrc:  Oral Oral Oral  SpO2: 100%   96%  Weight:      Height:        Intake/Output Summary (Last 24 hours) at 12/17/2020 0818 Last data filed at 12/16/2020 2137 Gross per 24 hour  Intake 382.26 ml  Output --  Net 382.26 ml    Last 3 Weights 12/15/2020 12/14/2020 12/14/2020  Weight (lbs) 153 lb 10.6 oz 135 lb 135 lb  Weight (kg) 69.7 kg 61.236 kg 61.236 kg    Body mass index is 28.1 kg/m.   General: Well nourished, well developed, in no acute distress Head: Atraumatic, normal size  Eyes: PEERLA, EOMI  Neck: Supple, no JVD Endocrine: No thryomegaly Cardiac:  Normal S1, S2; RRR; 2 out of 6 systolic ejection murmur Lungs: Crackles at the lung bases Abd: Soft, nontender, no hepatomegaly  Ext: No edema, pulses 2+ Musculoskeletal: No deformities, BUE and BLE strength normal and equal Skin: Warm and dry, no rashes   Neuro: Alert and oriented to person, place, time, and situation, CNII-XII grossly intact, no focal deficits  Psych: Normal mood and affect   Labs  High Sensitivity Troponin:   Recent Labs  Lab 12/14/20 2130 12/14/20 2345 12/15/20 0618 12/15/20 0817  TROPONINIHS 593* 502* 601* 652*     Cardiac EnzymesNo results for input(s): TROPONINI in the last 168 hours. No results for input(s): TROPIPOC in the last 168 hours.  Chemistry Recent Labs  Lab 12/15/20 0618 12/15/20 0952 12/15/20 1206 12/15/20 1214 12/16/20 0127 12/17/20 0338  NA 140  --    < > 142  142 137 138  K 3.9  --    < > 3.9  3.8 3.8 4.2  CL 111  --   --   --  102 104  CO2 22  --   --   --  25 28  GLUCOSE 120*  --   --   --  116* 112*   BUN 10  --   --   --  9 11  CREATININE 0.81 0.87  --   --  0.93 0.93  CALCIUM 8.5*  --   --   --  9.2 8.9  GFRNONAA >60 >60  --   --  >60 >60  ANIONGAP 7  --   --   --  10 6   < > = values in this interval not displayed.    Hematology Recent Labs  Lab 12/15/20 1601 12/16/20 0127 12/17/20 0338  WBC 12.2* 12.8* 10.0  RBC 3.24* 3.07* 2.75*  HGB 10.3* 9.7* 8.5*  HCT 32.0* 30.1* 27.3*  MCV 98.8 98.0 99.3  MCH 31.8 31.6 30.9  MCHC 32.2 32.2 31.1  RDW 13.5 13.7 13.7  PLT 288 314 220   BNP Recent Labs  Lab 12/15/20 0817  BNP 1,765.0*    DDimer No results for input(s): DDIMER in the last 168 hours.   Radiology  CARDIAC CATHETERIZATION  Result Date: 123XX123   LV end diastolic pressure is severely elevated.   Hemodynamic findings consistent with mild pulmonary hypertension. SUMMARY Angiographically Normal Coronary arteries with a left dominant system. Significant LVOT gradient with apical LV pressures of 184/12 mmHg with an EDP of 25, mid LV cavity 157/9 mmHg with EDP of 25 and outflow tract pressure of 111/9 mmHg with a stable EDP of 24 mmHg. Mild Secondary Pulmonary Hypertension with mean PAP of 29 mmHg and PCWP of 29 mmHg. Moderately reduced cardiac function with a cardiac output-index of 3.26 and 2.02 by Fick and 3.55-2.2 by thermodilution. RECOMMENDATIONS Gentle titration of medications to allow for diuresis without leading to systemic hypotension. Defer decision making to rounding cardiology service. Glenetta Hew, MD  ECHOCARDIOGRAM COMPLETE  Result Date: 12/15/2020    ECHOCARDIOGRAM REPORT   Patient Name:   Alexandria French Date of Exam: 12/15/2020 Medical Rec #:  [id number]     Height:       62.0 in Accession #:    [id number]    Weight:       135.0 lb Date of Birth:  25-Apr-1950    BSA:          1.618 m Patient Age:    70 years      BP:  108/56 mmHg Patient Gender: F             HR:           76 bpm. Exam Location:  Inpatient Procedure: 2D Echo, Cardiac Doppler and Color  Doppler                                MODIFIED REPORT:     This report was modified by Rudean Haskell MD on 12/15/2020 due to                                    Spelling.  Indications:     Acute MI  History:         Patient has prior history of Echocardiogram examinations, most                  recent 08/27/2019. Acute MI, Arrythmias:LBBB; Risk                  Factors:Hypertension.  Sonographer:     MH Referring Phys:  [id number] Stevan Born MARTIN Diagnosing Phys: Rudean Haskell MD IMPRESSIONS  1. Left ventricular ejection fraction, by estimation, is 55-60%. The left ventricle demonstrates regional wall motion abnormalities that are consistent with a left bundle branch block.There is severe asymmetric left ventricular hypertrophy. Left ventricular diastolic parameters are consistent with Grade I diastolic dysfunction (impaired relaxation). No significant LVOT Obstructive seen in this study.  2. Right ventricular systolic function is normal. The right ventricular size is normal. There is normal pulmonary artery systolic pressure.  3. The mitral valve is normal in structure. Mild mitral valve regurgitation. No evidence of mitral stenosis.  4. The aortic valve was not well visualized. Aortic valve regurgitation is trivial. No aortic stenosis is present.  5. The inferior vena cava is normal in size with <50% respiratory variability, suggesting right atrial pressure of 8 mmHg.  6. Technically difficult acquisition; in future studies consider use of echo-contrast. Comparison(s): A prior study was performed on 08/27/19. Prior images reviewed side by side. Compared to prior septal wall motion is new. LVOT obstructive more prominent on prior study. Conclusion(s)/Recommendation(s): Consider cardiac MRI for assessment of hypertrophic cardioymyopathy. FINDINGS  Left Ventricle: Left ventricular ejection fraction, by estimation, is 55 to 60%. The left ventricle has normal function. The left ventricle demonstrates  regional wall motion abnormalities. The left ventricular internal cavity size was small. There is severe asymmetric left ventricular hypertrophy. Abnormal (paradoxical) septal motion, consistent with left bundle branch block. Left ventricular diastolic parameters are consistent with Grade I diastolic dysfunction (impaired relaxation). Right Ventricle: The right ventricular size is normal. Right vetricular wall thickness was not well visualized. Right ventricular systolic function is normal. There is normal pulmonary artery systolic pressure. The tricuspid regurgitant velocity is 1.60 m/s, and with an assumed right atrial pressure of 8 mmHg, the estimated right ventricular systolic pressure is 123XX123 mmHg. Left Atrium: Left atrial size was normal in size. Right Atrium: Right atrial size was not well visualized. Pericardium: There is no evidence of pericardial effusion. Mitral Valve: The mitral valve is normal in structure. Mild mitral valve regurgitation. No evidence of mitral valve stenosis. Tricuspid Valve: The tricuspid valve is grossly normal. Tricuspid valve regurgitation is trivial. No evidence of tricuspid stenosis. Aortic Valve: The aortic valve was not well visualized. Aortic valve regurgitation is trivial. No aortic stenosis  is present. Aortic valve mean gradient measures 3.5 mmHg. Aortic valve peak gradient measures 13.7 mmHg. Aortic valve area, by VTI measures 2.92 cm. Pulmonic Valve: The pulmonic valve was not well visualized. Pulmonic valve regurgitation is not visualized. Aorta: The aortic root and ascending aorta are structurally normal, with no evidence of dilitation. Venous: The inferior vena cava is normal in size with less than 50% respiratory variability, suggesting right atrial pressure of 8 mmHg. IAS/Shunts: The atrial septum is grossly normal.  LEFT VENTRICLE PLAX 2D LVIDd:         3.70 cm     Diastology LVIDs:         2.40 cm     LV e' medial:  7.07 cm/s LV PW:         1.80 cm     LV e'  lateral: 7.18 cm/s LV IVS:        1.50 cm LVOT diam:     1.90 cm LV SV:         217 LV SV Index:   134 LVOT Area:     2.84 cm  LV Volumes (MOD) LV vol d, MOD A4C: 25.7 ml LV vol s, MOD A4C: 7.6 ml LV SV MOD A4C:     25.7 ml RIGHT VENTRICLE RV S prime:     6.20 cm/s TAPSE (M-mode): 2.0 cm LEFT ATRIUM           Index LA diam:      3.50 cm 2.16 cm/m LA Vol (A2C): 22.2 ml 13.72 ml/m LA Vol (A4C): 35.1 ml 21.70 ml/m  AORTIC VALVE AV Area (Vmax):    3.77 cm AV Area (Vmean):   2.70 cm AV Area (VTI):     2.92 cm AV Vmax:           185.00 cm/s AV Vmean:          195.400 cm/s AV VTI:            0.743 m AV Peak Grad:      13.7 mmHg AV Mean Grad:      3.5 mmHg LVOT Vmax:         246.00 cm/s LVOT Vmean:        186.000 cm/s LVOT VTI:          0.764 m LVOT/AV VTI ratio: 1.03  AORTA Ao Root diam: 3.00 cm Ao Asc diam:  2.70 cm MR Peak grad: 47.3 mmHg   TRICUSPID VALVE MR Vmax:      344.00 cm/s TR Peak grad:   10.2 mmHg                           TR Vmax:        160.00 cm/s                            SHUNTS                           Systemic VTI:  0.76 m                           Systemic Diam: 1.90 cm Rudean Haskell MD Electronically signed by Rudean Haskell MD Signature Date/Time: 12/15/2020/12:52:48 PM    Final (Updated)     Cardiac Studies  [facility] 12/15/2020     LV end diastolic pressure  is severely elevated.   Hemodynamic findings consistent with mild pulmonary hypertension.   SUMMARY Angiographically Normal Coronary arteries with a left dominant system. Significant LVOT gradient with apical LV pressures of 184/12 mmHg with an EDP of 25, mid LV cavity 157/9 mmHg with EDP of 25 and outflow tract pressure of 111/9 mmHg with a stable EDP of 24 mmHg. Mild Secondary Pulmonary Hypertension with mean PAP of 29 mmHg and PCWP of 29 mmHg. Moderately reduced cardiac function with a cardiac output-index of 3.26 and 2.02 by Fick and 3.55-2.2 by thermodilution.  TTE 12/15/2020  1. Left ventricular ejection fraction,  by estimation, is 55-60%. The left  ventricle demonstrates regional wall motion abnormalities that are  consistent with a left bundle branch block.There is severe asymmetric left  ventricular hypertrophy. Left  ventricular diastolic parameters are consistent with Grade I diastolic  dysfunction (impaired relaxation). No significant LVOT Obstructive seen in  this study.   2. Right ventricular systolic function is normal. The right ventricular  size is normal. There is normal pulmonary artery systolic pressure.   3. The mitral valve is normal in structure. Mild mitral valve  regurgitation. No evidence of mitral stenosis.   4. The aortic valve was not well visualized. Aortic valve regurgitation  is trivial. No aortic stenosis is present.   5. The inferior vena cava is normal in size with <50% respiratory  variability, suggesting right atrial pressure of 8 mmHg.   6. Technically difficult acquisition; in future studies consider use of  echo-contrast.   Patient Profile  Alexandria French is a 70 y.o. female with hypertrophic obstructive cardiomyopathy, paroxysmal atrial fibrillation on Eliquis, sick sinus syndrome status post pacemaker, hypertension, nonobstructive CAD who was admitted on 12/15/2020 with dizziness acute hypoxic respiratory failure secondary to decompensated diastolic heart failure.  Assessment & Plan   #Acute hypoxic respiratory failure #HFpEF #Elevated troponin/demand ischemia #Hypotension -Admitted with dizziness and fatigue.  Hypotensive in the emergency room and was given intravenous fluids.  Found to have pulmonary edema and possible pneumonia on chest CT. -Left heart catheterization with no significant CAD. -Right heart cath with elevated pulmonary wedge pressure.  This is indicative of acute HFpEF. -This is not appear to to be related to worsening hypertrophic obstructive cardiomyopathy.  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  Suspect  condition worsen when given fluids in the ER. -Procalcitonin was negative but I have reviewed her chest CT.  She may have pneumonia as well.  Chest x-ray read is pending this morning but I do see opacifications concerning for possible pneumonia.  We will add back Levaquin to complete a 5-day course. -She is now on midodrine 5 mg 3 times daily.  We will increase her Lasix to 60 mg IV twice daily.  Strict I's and O's.  Daily weights are needed.  She is still short of breath. -I have also ordered compression stockings.  This will help.  I recommended to minimize changing position from flat to standing.  #Hypertrophic obstructive colopathy -Gradients are stable.  We will add back metoprolol to tartrate 25 twice daily.  She was on bisoprolol and diltiazem at home.  I do not think she will tolerate these with her soft blood pressure. -Condition does not appear to be related to worsening hypertrophic obstructive cardiomyopathy.  I suspect her condition was worsened with fluid resuscitation in the emergency room as well as hypotension that resulted in worsening obstruction.  Her condition is improving with midodrine as well as diuresis.  We are also treating her for pneumonia.  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  Procalcitonin is negative.  Lactic acid is normal.  Her hypertrophic obstructive cardiomyopathy is relatively stable.  She does not appear septic.  Her condition has improved with midodrine.  This appears to have been a chronic issue for her.  Suspect she is had an acute change with possible pneumonia to explain decompensation.  Seems to be improving on midodrine.  #Paroxysmal atrial fibrillation -Maintaining sinus rhythm. -Continue Eliquis 5 mg twice daily.  #Nonsustained ventricular tachycardia -Replace magnesium.  Potassium seems to be stable.  Close monitoring while in-house.  Beta-blocker has been added back.  #Anemia -No signs of bleeding.  Plans for outpatient  colonoscopy.  -Hemoglobin trending down.  We will continue to monitor this. -No signs of bleeding. -Right radial cath site is clean and dry without evidence of bleeding. -Continue to monitor.  FEN -no IVF -code: full -diet: heart healthy -dvt ppx: eliquis   For questions or updates, please contact [place] Please consult [email or web address] for contact info under   Time Spent with Patient: I have spent a total of 35 minutes with patient reviewing hospital notes, telemetry, EKGs, labs and examining the patient as well as establishing an assessment and plan that was discussed with the patient.  > 50% of time was spent in direct patient care.    Signed, Addison Naegeli. Audie Box, MD, [place]  12/17/2020 8:18 AM

## 2020-12-18 ENCOUNTER — Inpatient Hospital Stay (HOSPITAL_COMMUNITY): Payer: Medicare PPO

## 2020-12-18 ENCOUNTER — Encounter (HOSPITAL_COMMUNITY): Payer: Self-pay | Admitting: Cardiology

## 2020-12-18 DIAGNOSIS — I214 Non-ST elevation (NSTEMI) myocardial infarction: Secondary | ICD-10-CM

## 2020-12-18 DIAGNOSIS — D508 Other iron deficiency anemias: Secondary | ICD-10-CM

## 2020-12-18 DIAGNOSIS — R195 Other fecal abnormalities: Secondary | ICD-10-CM

## 2020-12-18 DIAGNOSIS — K648 Other hemorrhoids: Secondary | ICD-10-CM

## 2020-12-18 DIAGNOSIS — D649 Anemia, unspecified: Secondary | ICD-10-CM

## 2020-12-18 LAB — BASIC METABOLIC PANEL
Anion gap: 10 (ref 5–15)
BUN: 15 mg/dL (ref 8–23)
CO2: 28 mmol/L (ref 22–32)
Calcium: 8.5 mg/dL — ABNORMAL LOW (ref 8.9–10.3)
Chloride: 97 mmol/L — ABNORMAL LOW (ref 98–111)
Creatinine, Ser: 0.99 mg/dL (ref 0.44–1.00)
GFR, Estimated: >60 mL/min (ref >60)
Glucose, Bld: 99 mg/dL (ref 70–99)
Potassium: 3.6 mmol/L (ref 3.5–5.1)
Sodium: 135 mmol/L (ref 135–145)

## 2020-12-18 LAB — CBC
HCT: 24.3 % — ABNORMAL LOW (ref 36.0–46.0)
Hemoglobin: 7.8 g/dL — ABNORMAL LOW (ref 12.0–15.0)
MCH: 31.6 pg (ref 26.0–34.0)
MCHC: 32.1 g/dL (ref 30.0–36.0)
MCV: 98.4 fL (ref 80.0–100.0)
Platelets: 209 10*3/uL (ref 150–400)
RBC: 2.47 MIL/uL — ABNORMAL LOW (ref 3.87–5.11)
RDW: 13.5 % (ref 11.5–15.5)
WBC: 6.1 10*3/uL (ref 4.0–10.5)
nRBC: 0 % (ref 0.0–0.2)

## 2020-12-18 LAB — IRON AND TIBC
Iron: 22 ug/dL — ABNORMAL LOW (ref 28–170)
Saturation Ratios: 6 % — ABNORMAL LOW (ref 10.4–31.8)
TIBC: 350 ug/dL (ref 250–450)
UIBC: 328 ug/dL

## 2020-12-18 LAB — FERRITIN: Ferritin: 34 ng/mL (ref 11–307)

## 2020-12-18 LAB — MAGNESIUM: Magnesium: 2.4 mg/dL (ref 1.7–2.4)

## 2020-12-18 MED ORDER — POLYETHYLENE GLYCOL 3350 17 G PO PACK
17.0000 g | PACK | Freq: Two times a day (BID) | ORAL | Status: AC
  Administered 2020-12-18 – 2020-12-19 (×2): 17 g via ORAL
  Filled 2020-12-18 (×2): qty 1

## 2020-12-18 MED ORDER — SODIUM CHLORIDE 0.9 % IV SOLN
1000.0000 mg | Freq: Once | INTRAVENOUS | Status: AC
  Administered 2020-12-18: 1000 mg via INTRAVENOUS
  Filled 2020-12-18: qty 20

## 2020-12-18 MED ORDER — SODIUM CHLORIDE 0.9 % IV SOLN
25.0000 mg | Freq: Once | INTRAVENOUS | Status: AC
  Administered 2020-12-18: 25 mg via INTRAVENOUS
  Filled 2020-12-18: qty 0.5

## 2020-12-18 NOTE — Consult Note (Addendum)
[facility] Consult: 1:36 PM 12/18/2020  LOS: 3 days    Referring Provider: Dr croitoru  Primary Care Physician:  Banquete Nation, MD Primary Gastroenterologist: Previously Dr. Gala Romney.  More recently Dr. Zenovia Jarred.    Reason for Consultation: Anemia.  Declining Hgb. FOBT +   HPI: Alexandria French is a 70 y.o. female.  PMH A. fib.  Chronic Eliquis.  Sick sinus syndrome, pacemaker placed 2021.  CAD.  Hypertension.  Hyperlipidemia.  GERD.  Diverticulosis.  Evaluated in 2021 for chronic rectal bleeding and IDA.    Evaluated at [facility].   12/2019 colonoscopy: Dr.  Adelina Mings Nonbleeding hemorrhoids.  Diverticulosis involving sigmoid, descending, splenic flexure.  Exam completed only to the splenic flexure due to tortuosity and cardiac instability (bradycardia) requiring termination of procedure.  May have been a small sessile polyp versus mucosal fold, but no biopsy obtained..   12/2019 EGD with gastritis, mild scattered gastric erythema, inflammation.  2 cm hiatal hernia.  Otherwise normal study.  CLOtest/gastric urease was negative. Pt takes oral iron bid.  Pt slated for colonoscopy 9/22 w Dr Hilarie Fredrickson.  This was to evaluate minor bleeding per rectum with wiping after bowel movements, iron deficiency anemia which responded to chronic bid iron and to attempt a full colonoscopy.  Patient relays stools are very dark due to iron.  No abdominal pain good appetite.  Occasional dysphagia to solids.  No nausea or vomiting.  Weight stable within 5 pounds.   No NSAIDs  She is now day 4 of admission w acute respiratory failure, hypoxia.  Progressive weakness, fatigue and some dizziness.  Acute diastolic heart failure, elevated troponins in setting of demand ischemia.  Has not had any change in GI issues, stool  still black in the setting of oral iron. Anemia present with declining Hgb.  Eliquis is on hold.  Hgb 10.3 >> 7.8.  Baseline 2 months ago was 10.1.  MCV 98.  Platelets, WBCs normal.   Iron low at 22.  Low iron sats at 6%.  Normal TIBC.  Ferritin 34.  BUN/creatinine currently normal. BNP 1765.  Troponins 502 -652. CT chest showing pleural effusions, atelectasis, edema in the lungs.  Moderate sliding HH.    Past Medical History:  Diagnosis Date  . A-fib ([place])   . CAD (coronary artery disease)   . Diverticulosis   . Esophageal reflux    no meds  . External hemorrhoids   . Gastritis   . Hypertension   . IDA (iron deficiency anemia)   .  Internal hemorrhoids   . Other and unspecified hyperlipidemia   . SSS (sick sinus syndrome) [facility])     Past Surgical History:  Procedure Laterality Date  . ABDOMINAL HYSTERECTOMY  08/2008  . BLADDER REPAIR    . PACEMAKER IMPLANT N/A 02/28/2020   Procedure: PACEMAKER IMPLANT;  Surgeon: Sanda Klein, MD;  Location: [place] [facility];  Service: Cardiovascular;  Laterality: N/A;  . RIGHT/LEFT HEART CATH AND CORONARY ANGIOGRAPHY N/A 12/15/2020   Procedure: RIGHT/LEFT HEART CATH AND CORONARY ANGIOGRAPHY;  Surgeon: Leonie Man, MD;  Location: [place] [facility];  Service: Cardiovascular;  Laterality: N/A;  . TONSILLECTOMY      Prior to Admission medications   Medication Sig Start Date End Date Taking? Authorizing Provider  acetaminophen (TYLENOL) 500 MG tablet Take 500-1,000 mg by mouth 2 (two) times daily as needed (back pain.).   Yes [provider]  apixaban (ELIQUIS) 5 MG TABS tablet TAKE 1 TABLET(5 MG) BY MOUTH TWICE DAILY Patient taking differently: Take 5 mg by mouth 2 (two) times daily. 12/08/20  Yes Croitoru, Mihai, MD  bisoprolol (ZEBETA) 10 MG tablet Take 10 mg every morning and (1/2 tablet) 5 mg every afternoon Patient taking differently: Take 5-10 mg by mouth See admin instructions. 10 mg in the morning  5 mg in the evening  12/01/20  Yes Croitoru, Mihai, MD  Calcium Carbonate-Vitamin D 600-400 MG-UNIT tablet Take 1 tablet by mouth 2 (two) times daily.    Yes [provider]  cetirizine (ZYRTEC) 10 MG tablet Take 10 mg by mouth every evening.    Yes [provider]  Coenzyme Q10 (COQ-10) 100 MG CAPS Take 100 mg by mouth daily.    Yes [provider]  diltiazem (DILACOR XR) 120 MG 24 hr capsule Take 1 capsule (120 mg total) by mouth daily. 10/20/20  Yes Loel Dubonnet, NP  ferrous sulfate 325 (65 FE) MG EC tablet Take 325 mg by mouth in the morning and at bedtime.   Yes [provider]  LORazepam (ATIVAN) 1 MG tablet Take 0.5 mg by mouth See admin instructions. Every other night   Yes [provider]  simvastatin (ZOCOR) 20 MG tablet Take 20 mg by mouth daily.   Yes [provider]  vitamin C (ASCORBIC ACID) 500 MG tablet Take 500 mg by mouth daily.   Yes [provider]  bisoprolol (ZEBETA) 5 MG tablet TAKE 1/2 TABLET BY MOUTH DAILY Patient not taking: No sig reported 12/01/20   Croitoru, Mihai, MD  Sodium Sulfate-Mag Sulfate-KCl (SUTAB) [phone] MG TABS Use as directed for colonoscopy. MANUFACTURER CODES!! BIN: [id number] PCN: CN GROUP: [id number] MEMBER ID: [id number] AS SECONDARY INSURANCE ;NO PRIOR AUTHORIZATION 10/23/20   Pyrtle, Lajuan Lines, MD    Scheduled Meds: . vitamin C  500 mg Oral Daily  . calcium-vitamin D  1 tablet Oral BID  . Chlorhexidine Gluconate Cloth  6 each Topical Daily  . ferrous sulfate  325 mg Oral BID WC  . furosemide  80 mg Intravenous BID  . levofloxacin  500 mg Oral Daily  . loratadine  10 mg Oral Daily  . LORazepam  0.5 mg Oral Q48H  . mouth rinse  15 mL Mouth Rinse BID  . melatonin  3 mg Oral QHS  . metoprolol tartrate  25 mg Oral BID  . midodrine  5 mg Oral TID WC  . simvastatin  20 mg Oral Daily  . sodium chloride flush  3 mL Intravenous Q12H  . sodium  chloride flush  3 mL Intravenous Q12H   Infusions: . sodium  chloride     PRN Meds: sodium chloride, acetaminophen, ondansetron (ZOFRAN) IV, sodium chloride flush, sodium chloride flush   Allergies as of 12/14/2020 - Review Complete 12/14/2020  Allergen Reaction Noted  . Cefzil [cefprozil] Other (See Comments) 08/13/2013    Family History  Problem Relation Age of Onset  . Heart disease Other   . Osteoporosis Other   . Arthritis Other   . Colon cancer Neg Hx   . Colon polyps Neg Hx     Social History   Socioeconomic History  . Marital status: Married                   Occupational History  . Retired Research officer, political party  Tobacco Use  . Smoking status: Never  . Smokeless tobacco: Never  Vaping Use  . Vaping Use: Never used  Substance and Sexual Activity  . Alcohol use: No    Alcohol/week: 0.0 standard drinks  . Drug use: No   REVIEW OF SYSTEMS: Constitutional: Fatigue improving ENT:  No nose bleeds Pulm: DOE improving CV:  No palpitations, no LE edema.  No angina GU:  No hematuria, no frequency GI: See HPI. Heme: Denies excessive or unusual bleeding or bruising. Transfusions: No previous blood transfusions. Neuro:  No headaches, no peripheral tingling or numbness.  No syncope, no seizures, no dizziness. Derm:  No itching, no rash or sores.  Endocrine:  No sweats or chills.  No polyuria or dysuria Immunization: Reviewed.    PHYSICAL EXAM: Vital signs in last 24 hours: Vitals:   12/18/20 0751 12/18/20 0956  BP: 109/61 (!) 107/50  Pulse: 74 83  Resp: 18 18  Temp: 98 F (36.7 C) 98.2 F (36.8 C)  SpO2: 100% 98%   Wt Readings from Last 3 Encounters:  12/18/20 62.4 kg  10/23/20 63 kg  10/20/20 63 kg    General: Very pleasant, well-appearing alert. Head: No facial asymmetry or swelling.  No signs of head trauma. Eyes: Conjunctiva pink.  No scleral icterus. Ears: No hearing deficit Nose: No congestion or discharge Mouth: Good dentition.  Tongue midline.  Oropharynx moist, pink, clear. Neck: No JVD, no masses, no  thyromegaly Lungs: Clear bilaterally.  No labored breathing or cough. Heart: 2/6 systolic murmur.  RRR.  S1, S2 present Abdomen: Soft without tenderness.  Bowel sounds normal but somewhat hypoactive.  No distention.  No HSM, masses, bruits, hernias..   Rectal: Moderately large, nonthrombosed but erythematous external hemorrhoids and palpable internal hemorrhoids.  Small amount of hard stool in rectal vault, difficult to get a good sample of this.  However specks of the stool test FOBT positive.  No visible blood.  There is a pressure protective dressing across the sacrum, not removed for exam. Musc/Skeltl: No joint redness, swelling or gross deformity. Extremities: No CCE. Neurologic: Alert.  Appropriate.  Oriented x3.  Moves all 4 limbs without gross weakness but strength not formally tested.  No tremor. Skin: No telangiectasia, no rash, no sores, no suspicious lesions Nodes: No cervical adenopathy Psych: Very pleasant.  Fluid speech.  In good spirits.  Intake/Output from previous day: 09/18 0701 - 09/19 0700 In: 1010 [P.O.:960; IV Piggyback:50] Out: -  Intake/Output this shift: Total I/O In: -  Out: 400 [Urine:400]  LAB RESULTS: Recent Labs    12/16/20 0127 12/17/20 0338 12/18/20 0348  WBC 12.8* 10.0 6.1  HGB 9.7* 8.5* 7.8*  HCT 30.1* 27.3* 24.3*  PLT 314  220 209   BMET Lab Results  Component Value Date   NA 135 12/18/2020   NA 138 12/17/2020   NA 137 12/16/2020   K 3.6 12/18/2020   K 4.2 12/17/2020   K 3.8 12/16/2020   CL 97 (L) 12/18/2020   CL 104 12/17/2020   CL 102 12/16/2020   CO2 28 12/18/2020   CO2 28 12/17/2020   CO2 25 12/16/2020   GLUCOSE 99 12/18/2020   GLUCOSE 112 (H) 12/17/2020   GLUCOSE 116 (H) 12/16/2020   BUN 15 12/18/2020   BUN 11 12/17/2020   BUN 9 12/16/2020   CREATININE 0.99 12/18/2020   CREATININE 0.93 12/17/2020   CREATININE 0.93 12/16/2020   CALCIUM 8.5 (L) 12/18/2020   CALCIUM 8.9 12/17/2020   CALCIUM 9.2 12/16/2020   LFT No  results for input(s): PROT, ALBUMIN, AST, ALT, ALKPHOS, BILITOT, BILIDIR, IBILI in the last 72 hours. PT/INR Lab Results  Component Value Date   INR 1.1 12/14/2020   INR 0.9 09/12/2008      RADIOLOGY STUDIES: CT CHEST WO CONTRAST  Result Date: 12/18/2020 CLINICAL DATA:  Acute hypoxic respiratory failure secondary to decompensated diastolic heart failure. EXAM: CT CHEST WITHOUT CONTRAST TECHNIQUE: Multidetector CT imaging of the chest was performed following the standard protocol without IV contrast. COMPARISON:  December 15, 2020. FINDINGS: Cardiovascular: Atherosclerosis of thoracic aorta is noted without aneurysm formation. Mild cardiomegaly is noted. Left-sided pacemaker is noted. Mediastinum/Nodes: Moderate size sliding-type hiatal hernia is noted. No adenopathy is noted. Thyroid gland is unremarkable. Lungs/Pleura: No pneumothorax is noted. Small bilateral pleural effusions are noted with adjacent subsegmental atelectasis of both lower lobes. Mild septal thickening is noted in both upper lobes concerning for pulmonary edema. Upper Abdomen: No acute abnormality. Musculoskeletal: No chest wall mass or suspicious bone lesions identified. IMPRESSION: Small bilateral pleural effusions are noted with adjacent subsegmental atelectasis of both lower lobes. Mild septal thickening is noted in both upper lobes concerning for pulmonary edema. Moderate size sliding-type hiatal hernia. Aortic Atherosclerosis (ICD10-I70.0). Electronically Signed   By: Marijo Conception M.D.   On: 12/18/2020 10:13   DG CHEST PORT 1 VIEW  Result Date: 12/17/2020 CLINICAL DATA:  Shortness of breath EXAM: PORTABLE CHEST 1 VIEW COMPARISON:  Chest x-ray December 14, 2020. FINDINGS: Pacemaker is stable. The cardiomediastinal silhouette is stable. No pneumothorax. Diffuse interstitial opacities. Increasing haziness in the right base. Increasing opacity in the left retrocardiac region with obscuration left hemidiaphragm. Probable  small layering effusion. IMPRESSION: 1. Cardiomegaly and pulmonary edema. 2. Increasing haziness over the right base may represent layering effusion with underlying atelectasis. Developing infiltrate not excluded. 3. Increasing opacity and probable small associated effusion in the left base/retrocardiac region. The opacity may represent atelectasis. Recommend attention on follow-up. Electronically Signed   By: Dorise Bullion III M.D.   On: 12/17/2020 08:35    IMPRESSION:      Normocytic anemia in patient on chronic oral iron for IDA. Intermittent minor bleeding per rectum with moderate to larger hemorrhoids on exam. Colonoscopy set up for 9/22.  I have notified Dr. Hilarie Fredrickson and staff that this should be canceled. Incomplete colonoscopy in 2021, mild, H. pylori negative gastritis on EGD 2021.     Elevated troponins.  Attributed to demand ischemia.  Acute diastolic heart failure.  Echocardiogram with grade 1 diastolic dysfunction.  LVEF 55 to 60%.  Mild, secondary pulmonary hypertension.  Clinically responding well to diuresis.    PAF.  Chronic Eliquis currently on hold.  Last dose was  evening of 9/18.      PLAN:        Per Dr. Silvano Rusk.  ? Pursue inpt endoscopic eval?    Azucena Freed  12/18/2020, 1:36 PM Phone [phone]    [place] Attending   I have taken an interval history, reviewed the chart and examined the patient. I agree with the Advanced Practitioner's note, impression and recommendations.  Majority the medical decision-making in the formulation of the assessment and plan were performed by me.   Iron deficiency anemia without clear explanation.  Its been a bit puzzling that she has not had an appropriate rise in her iron status despite taking iron for months if not longer.  Apparently there was a brief period of time where she stopped it at the direction of one of her physicians because her "numbers were okay".  At any rate I am thinking either she has chronic blood loss  that she cannot keep up with or a malabsorption problem or less likely both.  She does have some heme positivity and bleeding from hemorrhoids but I do not think that is the underlying cause of her iron deficiency anemia.  I think she should have her work-up while she is here in the hospital when the cardiologist think it is acceptable.  She is probably getting close.  She is certainly at high risk of problems if there are procedural complications and high risk of sedation but again this is an indicated procedure or procedures.   I have done and plan the following:  #1 is discontinue oral iron as it can interfere with a colonoscopy prep  #2 parenteral iron is ordered  #3 check B12 level which I find abnormal and a fair number of iron deficient elderly people  #4 start MiraLAX in anticipation of colonoscopy prep  #5 I am inclined to repeat an EGD most likely, we will probably perform a colonoscopy first and hold the EGD in reserve.  The other option would be a push enteroscopy  #6 need to consider capsule endoscopy depending upon what I find with the work-up outlined above  #7 though unlikely to have celiac disease we will consider biopsies at the time of endoscopic work-up  #8 clarify blood product refusal FYI on the chart  Gatha Mayer, MD, [facility] 12/18/2020 4:26 PM

## 2020-12-18 NOTE — Consult Note (Signed)
NAME:  Alexandria French, MRN:  [id number], DOB:  [birth year], LOS: 3 ADMISSION DATE:  12/14/2020, CONSULTATION DATE:  12/18/20 REFERRING MD:  Cards, CHIEF COMPLAINT:  SOB   History of Present Illness:  70 year old woman with hx of Afib, SSS s/p PPM, GERD, anemia presenting with insidious onset worsening SOB over past 2-3 weeks.  Worse when walking, no orthopnea.  No cough/fever/chills/aspiration symptoms.  Has diuresed to some good effect but still does not feel back to baseline.  She has trouble teasing out DOE vs. General fatigue.  She has been on and off abx.  PCCM consulted for abnormal CT performed today.  No prior lung issues; she thinks her DOE started around the time she got her PPM.  MMRC1 on good days.  Has chronic anemia with prior workup  noted in GI notes.  She is on chronic iron therapy.  She has become progressively anemic here.  Pertinent  Medical History   Past Medical History:  Diagnosis Date   A-fib ([place])    CAD (coronary artery disease)    Diverticulosis    Esophageal reflux    no meds   External hemorrhoids    Gastritis    Hypertension    IDA (iron deficiency anemia)    Internal hemorrhoids    Other and unspecified hyperlipidemia    SSS (sick sinus syndrome) ([place])      Significant Hospital Events: Including procedures, antibiotic start and stop dates in addition to other pertinent events   9/16 admitted, diuresed  Interim History / Subjective:  N/a  Objective   Blood pressure (!) 103/54, pulse 68, temperature 98 F (36.7 C), temperature source Oral, resp. rate 18, height 5\' 2"  (1.575 m), weight 62.4 kg, SpO2 98 %.        Intake/Output Summary (Last 24 hours) at 12/18/2020 1520 Last data filed at 12/18/2020 1400 Gross per 24 hour  Intake 460 ml  Output 1400 ml  Net -940 ml   Filed Weights   12/14/20 2108 12/15/20 1645 12/18/20 0523  Weight: 61.2 kg 69.7 kg 62.4 kg    Examination: General: no distress HENT: MMM, trachea midline Lungs: crackles at  bases, ext warm Cardiovascular: RRR, ext warm Abdomen: soft, +BS Extremities: no edema, has fair amount of bruising under R arm Neuro: moves all 4 ext to command Psych: AOX3, good insight  Resolved Hospital Problem list   N/a  Assessment & Plan:  Acute hypoxemic respiratory failure due to volume overload- CT review shows continued edema and small bilateral effusions with underlying atelectasis.  Could drain R but do not think large enough to benefit patient.    Would continue diuresing and (A) mobilize and (B) get anemia fixed before we can consider attributing this to pulmonary pathology. I will order her an incentive spirometer.  Also will set her up in clinic in 1-2 months to review DOE.  Also think you can dc antibiotics whenever, there is no evidence of infection.  Labs   CBC: Recent Labs  Lab 12/15/20 0952 12/15/20 1206 12/15/20 1214 12/15/20 1601 12/16/20 0127 12/17/20 0338 12/18/20 0348  WBC 9.6  --   --  12.2* 12.8* 10.0 6.1  NEUTROABS  --   --   --  11.2*  --   --   --   HGB 10.3*   < > 9.5*  9.2* 10.3* 9.7* 8.5* 7.8*  HCT 32.4*   < > 28.0*  27.0* 32.0* 30.1* 27.3* 24.3*  MCV 99.7  --   --  98.8 98.0 99.3 98.4  PLT 303  --   --  288 314 220 209   < > = values in this interval not displayed.    Basic Metabolic Panel: Recent Labs  Lab 12/14/20 2130 12/15/20 0618 12/15/20 0952 12/15/20 1206 12/15/20 1214 12/16/20 0127 12/17/20 0338 12/18/20 0348  NA 137 140  --  141 142  142 137 138 135  K 3.8 3.9  --  4.0 3.9  3.8 3.8 4.2 3.6  CL 105 111  --   --   --  102 104 97*  CO2 24 22  --   --   --  25 28 28   GLUCOSE 110* 120*  --   --   --  116* 112* 99  BUN 13 10  --   --   --  9 11 15   CREATININE 0.94 0.81 0.87  --   --  0.93 0.93 0.99  CALCIUM 9.7 8.5*  --   --   --  9.2 8.9 8.5*  MG  --   --   --   --   --  1.7 2.0 2.4   GFR: Estimated Creatinine Clearance: 47.9 mL/min (by C-G formula based on SCr of 0.99 mg/dL). Recent Labs  Lab 12/15/20 0817  12/15/20 0952 12/15/20 1601 12/16/20 0127 12/17/20 0338 12/18/20 0348  PROCALCITON <0.10  --   --  <0.10 <0.10  --   WBC 8.8 9.6 12.2* 12.8* 10.0 6.1  LATICACIDVEN 1.7 1.8  --   --   --   --     Liver Function Tests: No results for input(s): AST, ALT, ALKPHOS, BILITOT, PROT, ALBUMIN in the last 168 hours. No results for input(s): LIPASE, AMYLASE in the last 168 hours. No results for input(s): AMMONIA in the last 168 hours.  ABG    Component Value Date/Time   PHART 7.353 12/15/2020 1206   PCO2ART 34.2 12/15/2020 1206   PO2ART 125 (H) 12/15/2020 1206   HCO3 20.5 12/15/2020 1214   HCO3 20.7 12/15/2020 1214   TCO2 22 12/15/2020 1214   TCO2 22 12/15/2020 1214   ACIDBASEDEF 5.0 (H) 12/15/2020 1214   ACIDBASEDEF 5.0 (H) 12/15/2020 1214   O2SAT 53.0 12/15/2020 1214   O2SAT 51.0 12/15/2020 1214     Coagulation Profile: Recent Labs  Lab 12/14/20 2130  INR 1.1    Cardiac Enzymes: No results for input(s): CKTOTAL, CKMB, CKMBINDEX, TROPONINI in the last 168 hours.  HbA1C: No results found for: HGBA1C  CBG: Recent Labs  Lab 12/15/20 1400  GLUCAP 114*    Review of Systems:    Positive Symptoms in bold:  Constitutional fevers, chills, weight loss, fatigue, anorexia, malaise  Eyes decreased vision, double vision, eye irritation  Ears, Nose, Mouth, Throat sore throat, trouble swallowing, sinus congestion  Cardiovascular chest pain, paroxysmal nocturnal dyspnea, lower ext edema, palpitations   Respiratory SOB, cough, DOE, hemoptysis, wheezing  Gastrointestinal nausea, vomiting, diarrhea  Genitourinary burning with urination, trouble urinating  Musculoskeletal joint aches, joint swelling, back pain  Integumentary  rashes, skin lesions  Neurological focal weakness, focal numbness, trouble speaking, headaches  Psychiatric depression, anxiety, confusion  Endocrine polyuria, polydipsia, cold intolerance, heat intolerance  Hematologic abnormal bruising, abnormal bleeding,  unexplained nose bleeds  Allergic/Immunologic recurrent infections, hives, swollen lymph nodes     Past Medical History:  She,  has a past medical history of A-fib ([place]), CAD (coronary artery disease), Diverticulosis, Esophageal reflux, External hemorrhoids, Gastritis, Hypertension, IDA (iron deficiency anemia), Internal hemorrhoids, Other  and unspecified hyperlipidemia, and SSS (sick sinus syndrome) ([place]).   Surgical History:   Past Surgical History:  Procedure Laterality Date   ABDOMINAL HYSTERECTOMY  08/2008   BLADDER REPAIR     PACEMAKER IMPLANT N/A 02/28/2020   Procedure: PACEMAKER IMPLANT;  Surgeon: Sanda Klein, MD;  Location: [place] [facility];  Service: Cardiovascular;  Laterality: N/A;   RIGHT/LEFT HEART CATH AND CORONARY ANGIOGRAPHY N/A 12/15/2020   Procedure: RIGHT/LEFT HEART CATH AND CORONARY ANGIOGRAPHY;  Surgeon: Leonie Man, MD;  Location: [place] [facility];  Service: Cardiovascular;  Laterality: N/A;   TONSILLECTOMY       Social History:   reports that she has never smoked. She has never used smokeless tobacco. She reports that she does not drink alcohol and does not use drugs.   Family History:  Her family history includes Arthritis in an other family member; Heart disease in an other family member; Osteoporosis in an other family member. There is no history of Colon cancer or Colon polyps.   Allergies Allergies  Allergen Reactions   Cefzil [Cefprozil] Other (See Comments)    angioedema     Home Medications  Prior to Admission medications   Medication Sig Start Date End Date Taking? Authorizing Provider  acetaminophen (TYLENOL) 500 MG tablet Take 500-1,000 mg by mouth 2 (two) times daily as needed (back pain.).   Yes [provider]  apixaban (ELIQUIS) 5 MG TABS tablet TAKE 1 TABLET(5 MG) BY MOUTH TWICE DAILY Patient taking differently: Take 5 mg by mouth 2 (two) times daily. 12/08/20  Yes Croitoru, Mihai, MD  bisoprolol (ZEBETA) 10 MG tablet  Take 10 mg every morning and (1/2 tablet) 5 mg every afternoon Patient taking differently: Take 5-10 mg by mouth See admin instructions. 10 mg in the morning  5 mg in the evening 12/01/20  Yes Croitoru, Mihai, MD  Calcium Carbonate-Vitamin D 600-400 MG-UNIT tablet Take 1 tablet by mouth 2 (two) times daily.    Yes [provider]  cetirizine (ZYRTEC) 10 MG tablet Take 10 mg by mouth every evening.    Yes [provider]  Coenzyme Q10 (COQ-10) 100 MG CAPS Take 100 mg by mouth daily.    Yes [provider]  diltiazem (DILACOR XR) 120 MG 24 hr capsule Take 1 capsule (120 mg total) by mouth daily. 10/20/20  Yes Loel Dubonnet, NP  ferrous sulfate 325 (65 FE) MG EC tablet Take 325 mg by mouth in the morning and at bedtime.   Yes [provider]  LORazepam (ATIVAN) 1 MG tablet Take 0.5 mg by mouth See admin instructions. Every other night   Yes [provider]  simvastatin (ZOCOR) 20 MG tablet Take 20 mg by mouth daily.   Yes [provider]  vitamin C (ASCORBIC ACID) 500 MG tablet Take 500 mg by mouth daily.   Yes [provider]  bisoprolol (ZEBETA) 5 MG tablet TAKE 1/2 TABLET BY MOUTH DAILY Patient not taking: No sig reported 12/01/20   Croitoru, Mihai, MD  Sodium Sulfate-Mag Sulfate-KCl (SUTAB) [phone] MG TABS Use as directed for colonoscopy. MANUFACTURER CODES!! Kara Dies: [id number] PCN: CN GROUP: [id number] MEMBER ID: [id number];XIH AS SECONDARY INSURANCE ;NO PRIOR AUTHORIZATION 10/23/20   Pyrtle, Lajuan Lines, MD

## 2020-12-18 NOTE — Progress Notes (Signed)
Cardiology Progress Note  [name] MRN: [id number] DOB: [birth year] Date of Encounter: 12/18/2020  Primary Cardiologist: Sanda Klein, MD  Subjective   Chief Complaint: Shortness of breath  HPI: Remains hypoxic with ambulation.  Satting well at rest on room air.  Chest x-ray with persistent interstitial edema.  I's and O's are inaccurate however weight is down 7 kg.  ROS:  All other ROS reviewed and negative. Pertinent positives noted in the HPI.     Inpatient Medications  Scheduled Meds:  vitamin C  500 mg Oral Daily   calcium-vitamin D  1 tablet Oral BID   Chlorhexidine Gluconate Cloth  6 each Topical Daily   ferrous sulfate  325 mg Oral BID WC   furosemide  80 mg Intravenous BID   levofloxacin  500 mg Oral Daily   loratadine  10 mg Oral Daily   LORazepam  0.5 mg Oral Q48H   mouth rinse  15 mL Mouth Rinse BID   melatonin  3 mg Oral QHS   metoprolol tartrate  25 mg Oral BID   midodrine  5 mg Oral TID WC   simvastatin  20 mg Oral Daily   sodium chloride flush  3 mL Intravenous Q12H   sodium chloride flush  3 mL Intravenous Q12H   Continuous Infusions:  sodium chloride     PRN Meds: sodium chloride, acetaminophen, ondansetron (ZOFRAN) IV, sodium chloride flush, sodium chloride flush   Vital Signs   Vitals:   12/17/20 1740 12/17/20 2031 12/18/20 0523 12/18/20 0751  BP: 121/69 98/71 (!) 101/49 109/61  Pulse: 85 93 68 74  Resp: '16 19 18 18  '$ Temp: 98.1 F (36.7 C) 98 F (36.7 C) 98.2 F (36.8 C) 98 F (36.7 C)  TempSrc: Oral Oral Oral Oral  SpO2: 98% 94% 100% 100%  Weight:   62.4 kg   Height:        Intake/Output Summary (Last 24 hours) at 12/18/2020 0759 Last data filed at 12/17/2020 2230 Gross per 24 hour  Intake 1010 ml  Output --  Net 1010 ml   Last 3 Weights 12/18/2020 12/15/2020 12/14/2020  Weight (lbs) 137 lb 9.1 oz 153 lb 10.6 oz 135 lb  Weight (kg) 62.4 kg 69.7 kg 61.236 kg      Telemetry  Overnight telemetry shows sinus rhythm  with intermittent V paced, which I personally reviewed.   Physical Exam   Vitals:   12/17/20 1740 12/17/20 2031 12/18/20 0523 12/18/20 0751  BP: 121/69 98/71 (!) 101/49 109/61  Pulse: 85 93 68 74  Resp: '16 19 18 18  '$ Temp: 98.1 F (36.7 C) 98 F (36.7 C) 98.2 F (36.8 C) 98 F (36.7 C)  TempSrc: Oral Oral Oral Oral  SpO2: 98% 94% 100% 100%  Weight:   62.4 kg   Height:        Intake/Output Summary (Last 24 hours) at 12/18/2020 0759 Last data filed at 12/17/2020 2230 Gross per 24 hour  Intake 1010 ml  Output --  Net 1010 ml    Last 3 Weights 12/18/2020 12/15/2020 12/14/2020  Weight (lbs) 137 lb 9.1 oz 153 lb 10.6 oz 135 lb  Weight (kg) 62.4 kg 69.7 kg 61.236 kg    Body mass index is 25.16 kg/m.   General: Well nourished, well developed, in no acute distress Head: Atraumatic, normal size  Eyes: PEERLA, EOMI  Neck: Supple, no JVD Endocrine: No thryomegaly Cardiac: Normal S1, S2; RRR; 2 out of 6 systolic ejection murmur Lungs: Diminished  breath sounds at the lung bases Abd: Soft, nontender, no hepatomegaly  Ext: No edema, pulses 2+ Musculoskeletal: No deformities, BUE and BLE strength normal and equal Skin: Warm and dry, no rashes   Neuro: Alert and oriented to person, place, time, and situation, CNII-XII grossly intact, no focal deficits  Psych: Normal mood and affect   Labs  High Sensitivity Troponin:   Recent Labs  Lab 12/14/20 2130 12/14/20 2345 12/15/20 0618 12/15/20 0817  TROPONINIHS 593* 502* 601* 652*     Cardiac EnzymesNo results for input(s): TROPONINI in the last 168 hours. No results for input(s): TROPIPOC in the last 168 hours.  Chemistry Recent Labs  Lab 12/16/20 0127 12/17/20 0338 12/18/20 0348  NA 137 138 135  K 3.8 4.2 3.6  CL 102 104 97*  CO2 '25 28 28  '$ GLUCOSE 116* 112* 99  BUN '9 11 15  '$ CREATININE 0.93 0.93 0.99  CALCIUM 9.2 8.9 8.5*  GFRNONAA >60 >60 >60  ANIONGAP '10 6 10    '$ Hematology Recent Labs  Lab 12/16/20 0127 12/17/20 0338  12/18/20 0348  WBC 12.8* 10.0 6.1  RBC 3.07* 2.75* 2.47*  HGB 9.7* 8.5* 7.8*  HCT 30.1* 27.3* 24.3*  MCV 98.0 99.3 98.4  MCH 31.6 30.9 31.6  MCHC 32.2 31.1 32.1  RDW 13.7 13.7 13.5  PLT 314 220 209   BNP Recent Labs  Lab 12/15/20 0817  BNP 1,765.0*    DDimer No results for input(s): DDIMER in the last 168 hours.   Radiology  DG CHEST PORT 1 VIEW  Result Date: 12/17/2020 CLINICAL DATA:  Shortness of breath EXAM: PORTABLE CHEST 1 VIEW COMPARISON:  Chest x-ray December 14, 2020. FINDINGS: Pacemaker is stable. The cardiomediastinal silhouette is stable. No pneumothorax. Diffuse interstitial opacities. Increasing haziness in the right base. Increasing opacity in the left retrocardiac region with obscuration left hemidiaphragm. Probable small layering effusion. IMPRESSION: 1. Cardiomegaly and pulmonary edema. 2. Increasing haziness over the right base may represent layering effusion with underlying atelectasis. Developing infiltrate not excluded. 3. Increasing opacity and probable small associated effusion in the left base/retrocardiac region. The opacity may represent atelectasis. Recommend attention on follow-up. Electronically Signed   By: Dorise Bullion III M.D.   On: 12/17/2020 08:35    Cardiac Studies  TTE 12/15/2020  1. Left ventricular ejection fraction, by estimation, is 55-60%. The left  ventricle demonstrates regional wall motion abnormalities that are  consistent with a left bundle branch block.There is severe asymmetric left  ventricular hypertrophy. Left  ventricular diastolic parameters are consistent with Grade I diastolic  dysfunction (impaired relaxation). No significant LVOT Obstructive seen in  this study.   2. Right ventricular systolic function is normal. The right ventricular  size is normal. There is normal pulmonary artery systolic pressure.   3. The mitral valve is normal in structure. Mild mitral valve  regurgitation. No evidence of mitral stenosis.   4.  The aortic valve was not well visualized. Aortic valve regurgitation  is trivial. No aortic stenosis is present.   5. The inferior vena cava is normal in size with <50% respiratory  variability, suggesting right atrial pressure of 8 mmHg.   6. Technically difficult acquisition; in future studies consider use of  echo-contrast.  LHC 12/15/2020 SUMMARY Angiographically Normal Coronary arteries with a left dominant system. Significant LVOT gradient with apical LV pressures of 184/12 mmHg with an EDP of 25, mid LV cavity 157/9 mmHg with EDP of 25 and outflow tract pressure of 111/9 mmHg with a  stable EDP of 24 mmHg. Mild Secondary Pulmonary Hypertension with mean PAP of 29 mmHg and PCWP of 29 mmHg. Moderately reduced cardiac function with a cardiac output-index of 3.26 and 2.02 by Fick and 3.55-2.2 by thermodilution.     RECOMMENDATIONS Gentle titration of medications to allow for diuresis without leading to systemic hypotension. Defer decision making to rounding cardiology service.  Patient Profile  Alexandria French is a 70 y.o. female with hypertrophic obstructive cardiomyopathy, paroxysmal atrial fibrillation on Eliquis, sick sinus syndrome status post pacemaker, hypertension, nonobstructive CAD who was admitted on 12/15/2020 with dizziness acute hypoxic respiratory failure secondary to decompensated diastolic heart failure.  Assessment & Plan   #Acute hypoxic respiratory failure #HFpEF #Elevated troponin/demand ischemia -Admitted with dizziness and fatigue.  Was hypotensive in the emergency room and given fluids.  Was found to be in decompensated diastolic heart failure. -Left heart cath with no appreciable CAD.  She did have elevated wedge pressure up to 29 mmHg. -She is undergone aggressive IV diuresis.  I's and O's are inaccurate but weights are down 7 kg.  Still requiring oxygen with ambulation.  Chest x-ray with persistent pulmonary edema. -We did add back antibiotics in case this is  persistent pneumonia. -Given that she is still hypoxic and not improving I would like to obtain a chest CT without contrast to further delineate what is going on.  I do wonder if this is possibly pneumonia.  She appears euvolemic to me. -We will continue with 5-day course of Levaquin. -Her hypotension has resolved with midodrine.  #Orthostatic hypotension -She does have profound changes in blood pressure.  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  They have improved with midodrine 5 mg twice daily.  She is also wearing compression stockings.  Her lactic acids were normal.  She was not in cardiogenic shock on right heart cath numbers.  I really have no other explanation.  Sepsis was a concern initially and we stopped antibiotics.  We have added back due to persistent hypoxic respiratory failure.  We will see how she does.  #Hypertrophic obstructive cardiomyopathy -Gradients are stable.  This does not appear to represent worsening HOCM. -We have added metoprolol tartrate 25 twice daily.  Rates are well controlled. -Holding home bisoprolol.  She was on diltiazem.  We will further titrate her metoprolol as we are able. -She may benefit from [facility] evaluation as an outpatient.  #Anemia -Hemoglobin continues to trend down.  No obvious source of bleeding. -Wonder if this could explain some of her symptoms. -I have ordered iron studies. -Hold Eliquis. -We will asked GI to see her as an inpatient.  She was having a planned colonoscopy as an outpatient by this likely needs to be removed.  #Paroxysmal atrial fibrillation -Maintaining sinus rhythm. -Holding Eliquis due to anemia.  #Nonsustained ventricular tachycardia -Continue beta-blocker.  No further episodes.  FEN -No intravenous fluids -Code: Full -Diet: Heart healthy -DVT PPx: SCDs.  Holding Eliquis for now.  For questions or updates, please contact [place] Please consult [email or web address] for contact  info under    Time Spent with Patient: I have spent a total of 35 minutes with patient reviewing hospital notes, telemetry, EKGs, labs and examining the patient as well as establishing an assessment and plan that was discussed with the patient.  > 50% of time was spent in direct patient care.    Signed, Addison Naegeli. Audie Box, MD, [place]  12/18/2020 7:59 AM

## 2020-12-18 NOTE — Care Management Important Message (Signed)
Important Message  Patient Details  Name: Alexandria French MRN: [id number] Date of Birth: 23-Mar-1951   Medicare Important Message Given:  Yes     [name] Montine Circle 12/18/2020, 4:03 PM

## 2020-12-18 NOTE — Telephone Encounter (Signed)
I have discussed colonoscopy cancellation with patient's spouse (patient is still in the hospital). I advised that she will need to be seen in our office prior to rescheduling any procedures due to her very recent MI. I advised that it appears she will get a GI consult inpatient but if nothing is done procedure wise at the hospital, I will contact them at a later time to schedule an office visit. Spouse verbalizes understanding.

## 2020-12-19 ENCOUNTER — Telehealth: Payer: Self-pay

## 2020-12-19 DIAGNOSIS — D500 Iron deficiency anemia secondary to blood loss (chronic): Secondary | ICD-10-CM

## 2020-12-19 DIAGNOSIS — I214 Non-ST elevation (NSTEMI) myocardial infarction: Secondary | ICD-10-CM | POA: Diagnosis not present

## 2020-12-19 LAB — CBC
HCT: 25.9 % — ABNORMAL LOW (ref 36.0–46.0)
Hemoglobin: 8.1 g/dL — ABNORMAL LOW (ref 12.0–15.0)
MCH: 30.7 pg (ref 26.0–34.0)
MCHC: 31.3 g/dL (ref 30.0–36.0)
MCV: 98.1 fL (ref 80.0–100.0)
Platelets: 251 10*3/uL (ref 150–400)
RBC: 2.64 MIL/uL — ABNORMAL LOW (ref 3.87–5.11)
RDW: 13.4 % (ref 11.5–15.5)
WBC: 5.1 10*3/uL (ref 4.0–10.5)
nRBC: 0 % (ref 0.0–0.2)

## 2020-12-19 LAB — BASIC METABOLIC PANEL
Anion gap: 11 (ref 5–15)
BUN: 18 mg/dL (ref 8–23)
CO2: 29 mmol/L (ref 22–32)
Calcium: 9.2 mg/dL (ref 8.9–10.3)
Chloride: 96 mmol/L — ABNORMAL LOW (ref 98–111)
Creatinine, Ser: 0.97 mg/dL (ref 0.44–1.00)
GFR, Estimated: >60 mL/min (ref >60)
Glucose, Bld: 101 mg/dL — ABNORMAL HIGH (ref 70–99)
Potassium: 3.4 mmol/L — ABNORMAL LOW (ref 3.5–5.1)
Sodium: 136 mmol/L (ref 135–145)

## 2020-12-19 LAB — VITAMIN B12: Vitamin B-12: 846 pg/mL (ref 180–914)

## 2020-12-19 LAB — MAGNESIUM: Magnesium: 2.3 mg/dL (ref 1.7–2.4)

## 2020-12-19 MED ORDER — PEG-KCL-NACL-NASULF-NA ASC-C 100 G PO SOLR
0.5000 | Freq: Once | ORAL | Status: AC
  Administered 2020-12-19: 100 g via ORAL
  Filled 2020-12-19 (×2): qty 1

## 2020-12-19 MED ORDER — BISACODYL 5 MG PO TBEC
20.0000 mg | DELAYED_RELEASE_TABLET | Freq: Once | ORAL | Status: AC
  Administered 2020-12-19: 20 mg via ORAL
  Filled 2020-12-19: qty 4

## 2020-12-19 MED ORDER — SPIRONOLACTONE 12.5 MG HALF TABLET
12.5000 mg | ORAL_TABLET | Freq: Every day | ORAL | Status: DC
  Administered 2020-12-19: 12.5 mg via ORAL
  Filled 2020-12-19: qty 1

## 2020-12-19 MED ORDER — PEG-KCL-NACL-NASULF-NA ASC-C 100 G PO SOLR: 1.0000 | Freq: Once | ORAL | Status: DC

## 2020-12-19 MED ORDER — PEG-KCL-NACL-NASULF-NA ASC-C 100 G PO SOLR
0.5000 | Freq: Once | ORAL | Status: AC
  Administered 2020-12-19: 100 g via ORAL
  Filled 2020-12-19: qty 1

## 2020-12-19 MED ORDER — POTASSIUM CHLORIDE CRYS ER 20 MEQ PO TBCR
40.0000 meq | EXTENDED_RELEASE_TABLET | Freq: Two times a day (BID) | ORAL | Status: DC
  Administered 2020-12-19 (×2): 40 meq via ORAL
  Filled 2020-12-19 (×2): qty 2

## 2020-12-19 MED ORDER — METOCLOPRAMIDE HCL 5 MG/ML IJ SOLN
10.0000 mg | Freq: Four times a day (QID) | INTRAMUSCULAR | Status: AC
  Administered 2020-12-19: 10 mg via INTRAVENOUS
  Filled 2020-12-19: qty 2

## 2020-12-19 NOTE — Telephone Encounter (Signed)
Patient currently in hospital and has been scheduled for follow up 1 month from now with Dr. Verlee Monte. Nothing further needed at this time.   Next Appt With Pulmonology Maryjane Hurter, MD)01/17/2021 at  2:00 PM

## 2020-12-19 NOTE — Progress Notes (Addendum)
          Daily Rounding Note  12/19/2020, 1:51 PM  LOS: 4 days   SUBJECTIVE:   Chief complaint:    anemia Received parenteral iron today.  No BM for several days.  Feels well.  Denies SOB  OBJECTIVE:         Vital signs in last 24 hours:    Temp:  [97.5 F (36.4 C)-99 F (37.2 C)] 97.8 F (36.6 C) (09/20 1214) Pulse Rate:  [65-72] 68 (09/20 1348) Resp:  [16-18] 18 (09/20 1214) BP: (94-134)/(54-66) 133/62 (09/20 1348) SpO2:  [95 %-98 %] 95 % (09/20 0739) Weight:  [61.3 kg] 61.3 kg (09/20 0458) Last BM Date: 12/17/20 (states only small BM on 18th, feels constipated) Filed Weights   12/15/20 1645 12/18/20 0523 12/19/20 0458  Weight: 69.7 kg 62.4 kg 61.3 kg   General: looks well   Heart: RRR Chest: clear bil Abdomen: soft, not tender or distended.  Active BS  Extremities: no CCE Neuro/Psych: Oriented x3.  Fully alert.  Appropriate.  Lab Results: Recent Labs    12/17/20 0338 12/18/20 0348 12/19/20 0215  WBC 10.0 6.1 5.1  HGB 8.5* 7.8* 8.1*  HCT 27.3* 24.3* 25.9*  PLT 220 209 251   BMET Recent Labs    12/17/20 0338 12/18/20 0348 12/19/20 0215  NA 138 135 136  K 4.2 3.6 3.4*  CL 104 97* 96*  CO2 28 28 29   GLUCOSE 112* 99 101*  BUN 11 15 18   CREATININE 0.93 0.99 0.97  CALCIUM 8.9 8.5* 9.2    ASSESMENT:   Normocytic anemia.  History of IDA on chronic oral iron.Hgb 8.1.  Low iron, low iron sat, borderline low ferritin.  Normal TIBC, Normal B12.  Intermittent minor bleeding per rectum in patient with moderately large hemorrhoids.  Diastolic CHF.     PAF.  Chronic Eliquis is on hold, last dose was 9/18.   PLAN   Secure chatted cardiologist to see if they are okay with [place] setting patient up for EGD, colonoscopy tomorrow.  Going to add additional laxatives (dulcolax 20 mg x 1) and may end up ordering a full on bowel prep depending on timing of GI procedures.  Change to clear diet.    Addendum at 2:09  PM.  Dr. Sallyanne Kuster says fine to proceed with EGD and colonoscopy so will modify the prep orders and notified Endo unit to schedule the case for tomorrow  Alexandria French  12/19/2020, 1:51 PM Phone [place] Attending   I have taken an interval history, reviewed the chart and examined the patient. I agree with the Advanced Practitioner's note, impression and recommendations.  Majority the medical decision-making in the formulation of the assessment and plan were performed by me.  For EGD and colonoscopy tomorrow.  Alexandria Mayer, MD, [facility] 12/19/2020 4:36 PM

## 2020-12-19 NOTE — Telephone Encounter (Signed)
-----   Message from Candee Furbish, MD sent at 12/18/2020  4:11 PM EDT ----- Regarding: 1-2 mo f/u with any Review DOE

## 2020-12-19 NOTE — Progress Notes (Signed)
Cardiology Progress Note  [name] MRN: [id number] DOB: [birth year] Date of Encounter: 12/19/2020  Primary Cardiologist: Sanda Klein, MD  Subjective   Chief Complaint: Shortness of breath  HPI: Good diuresis.  Evaluated by pulmonary.  Feel like we should continue with diuresis.  No indications for thoracentesis.  Evaluated by GI.  We need to determine if they will pursue EGD and colonoscopy this admission.  ROS:  All other ROS reviewed and negative. Pertinent positives noted in the HPI.     Inpatient Medications  Scheduled Meds:  vitamin C  500 mg Oral Daily   calcium-vitamin D  1 tablet Oral BID   Chlorhexidine Gluconate Cloth  6 each Topical Daily   furosemide  80 mg Intravenous BID   loratadine  10 mg Oral Daily   LORazepam  0.5 mg Oral Q48H   mouth rinse  15 mL Mouth Rinse BID   melatonin  3 mg Oral QHS   metoprolol tartrate  25 mg Oral BID   midodrine  5 mg Oral TID WC   polyethylene glycol  17 g Oral BID   potassium chloride  40 mEq Oral BID   simvastatin  20 mg Oral Daily   sodium chloride flush  3 mL Intravenous Q12H   sodium chloride flush  3 mL Intravenous Q12H   Continuous Infusions:  sodium chloride     PRN Meds: sodium chloride, acetaminophen, ondansetron (ZOFRAN) IV, sodium chloride flush, sodium chloride flush   Vital Signs   Vitals:   12/18/20 1808 12/18/20 2024 12/19/20 0458 12/19/20 0739  BP:  (!) 134/55 (!) 94/58 111/64  Pulse:  72 65 66  Resp:   17 16  Temp:  99 F (37.2 C) 98.8 F (37.1 C) 98 F (36.7 C)  TempSrc:  Oral Oral Oral  SpO2: 95% 96% 97% 95%  Weight:   61.3 kg   Height:        Intake/Output Summary (Last 24 hours) at 12/19/2020 0953 Last data filed at 12/19/2020 0739 Gross per 24 hour  Intake 2470.89 ml  Output 3200 ml  Net -729.11 ml   Last 3 Weights 12/19/2020 12/18/2020 12/15/2020  Weight (lbs) 135 lb 3.2 oz 137 lb 9.1 oz 153 lb 10.6 oz  Weight (kg) 61.326 kg 62.4 kg 69.7 kg      Telemetry   Overnight telemetry shows sinus rhythm in the 90s, intermittent V pacing, which I personally reviewed.   Physical Exam   Vitals:   12/18/20 1808 12/18/20 2024 12/19/20 0458 12/19/20 0739  BP:  (!) 134/55 (!) 94/58 111/64  Pulse:  72 65 66  Resp:   17 16  Temp:  99 F (37.2 C) 98.8 F (37.1 C) 98 F (36.7 C)  TempSrc:  Oral Oral Oral  SpO2: 95% 96% 97% 95%  Weight:   61.3 kg   Height:        Intake/Output Summary (Last 24 hours) at 12/19/2020 0953 Last data filed at 12/19/2020 0739 Gross per 24 hour  Intake 2470.89 ml  Output 3200 ml  Net -729.11 ml    Last 3 Weights 12/19/2020 12/18/2020 12/15/2020  Weight (lbs) 135 lb 3.2 oz 137 lb 9.1 oz 153 lb 10.6 oz  Weight (kg) 61.326 kg 62.4 kg 69.7 kg    Body mass index is 24.73 kg/m.   General: Well nourished, well developed, in no acute distress Head: Atraumatic, normal size  Eyes: PEERLA, EOMI  Neck: Supple, no JVD Endocrine: No thryomegaly Cardiac: Normal S1, S2;  RRR; no murmurs, rubs, or gallops Lungs: Crackles at the lung bases Abd: Soft, nontender, no hepatomegaly  Ext: No edema, pulses 2+ Musculoskeletal: No deformities, BUE and BLE strength normal and equal Skin: Warm and dry, no rashes   Neuro: Alert and oriented to person, place, time, and situation, CNII-XII grossly intact, no focal deficits  Psych: Normal mood and affect   Labs  High Sensitivity Troponin:   Recent Labs  Lab 12/14/20 2130 12/14/20 2345 12/15/20 0618 12/15/20 0817  TROPONINIHS 593* 502* 601* 652*     Cardiac EnzymesNo results for input(s): TROPONINI in the last 168 hours. No results for input(s): TROPIPOC in the last 168 hours.  Chemistry Recent Labs  Lab 12/17/20 0338 12/18/20 0348 12/19/20 0215  NA 138 135 136  K 4.2 3.6 3.4*  CL 104 97* 96*  CO2 28 28 29   GLUCOSE 112* 99 101*  BUN 11 15 18   CREATININE 0.93 0.99 0.97  CALCIUM 8.9 8.5* 9.2  GFRNONAA >60 >60 >60  ANIONGAP 6 10 11     Hematology Recent Labs  Lab 12/17/20 0338  12/18/20 0348 12/19/20 0215  WBC 10.0 6.1 5.1  RBC 2.75* 2.47* 2.64*  HGB 8.5* 7.8* 8.1*  HCT 27.3* 24.3* 25.9*  MCV 99.3 98.4 98.1  MCH 30.9 31.6 30.7  MCHC 31.1 32.1 31.3  RDW 13.7 13.5 13.4  PLT 220 209 251   BNP Recent Labs  Lab 12/15/20 0817  BNP 1,765.0*    DDimer No results for input(s): DDIMER in the last 168 hours.   Radiology  CT CHEST WO CONTRAST  Result Date: 12/18/2020 CLINICAL DATA:  Acute hypoxic respiratory failure secondary to decompensated diastolic heart failure. EXAM: CT CHEST WITHOUT CONTRAST TECHNIQUE: Multidetector CT imaging of the chest was performed following the standard protocol without IV contrast. COMPARISON:  December 15, 2020. FINDINGS: Cardiovascular: Atherosclerosis of thoracic aorta is noted without aneurysm formation. Mild cardiomegaly is noted. Left-sided pacemaker is noted. Mediastinum/Nodes: Moderate size sliding-type hiatal hernia is noted. No adenopathy is noted. Thyroid gland is unremarkable. Lungs/Pleura: No pneumothorax is noted. Small bilateral pleural effusions are noted with adjacent subsegmental atelectasis of both lower lobes. Mild septal thickening is noted in both upper lobes concerning for pulmonary edema. Upper Abdomen: No acute abnormality. Musculoskeletal: No chest wall mass or suspicious bone lesions identified. IMPRESSION: Small bilateral pleural effusions are noted with adjacent subsegmental atelectasis of both lower lobes. Mild septal thickening is noted in both upper lobes concerning for pulmonary edema. Moderate size sliding-type hiatal hernia. Aortic Atherosclerosis (ICD10-I70.0). Electronically Signed   By: Marijo Conception M.D.   On: 12/18/2020 10:13    Cardiac Studies  TTE 12/15/2020 SUMMARY Angiographically Normal Coronary arteries with a left dominant system. Significant LVOT gradient with apical LV pressures of 184/12 mmHg with an EDP of 25, mid LV cavity 157/9 mmHg with EDP of 25 and outflow tract pressure of 111/9 mmHg  with a stable EDP of 24 mmHg. Mild Secondary Pulmonary Hypertension with mean PAP of 29 mmHg and PCWP of 29 mmHg. Moderately reduced cardiac function with a cardiac output-index of 3.26 and 2.02 by Fick and 3.55-2.2 by thermodilution.  TTE 12/15/2020   1. Left ventricular ejection fraction, by estimation, is 55-60%. The left  ventricle demonstrates regional wall motion abnormalities that are  consistent with a left bundle branch block.There is severe asymmetric left  ventricular hypertrophy. Left  ventricular diastolic parameters are consistent with Grade I diastolic  dysfunction (impaired relaxation). No significant LVOT Obstructive seen in  this  study.   2. Right ventricular systolic function is normal. The right ventricular  size is normal. There is normal pulmonary artery systolic pressure.   3. The mitral valve is normal in structure. Mild mitral valve  regurgitation. No evidence of mitral stenosis.   4. The aortic valve was not well visualized. Aortic valve regurgitation  is trivial. No aortic stenosis is present.   5. The inferior vena cava is normal in size with <50% respiratory  variability, suggesting right atrial pressure of 8 mmHg.   6. Technically difficult acquisition; in future studies consider use of  echo-contrast.   Patient Profile  Alexandria French is a 70 y.o. female with hypertrophic obstructive cardiomyopathy, paroxysmal atrial fibrillation on Eliquis, sick sinus syndrome status post pacemaker, hypertension, nonobstructive CAD who was admitted on 12/15/2020 with dizziness acute hypoxic respiratory failure secondary to decompensated diastolic heart failure.  Assessment & Plan   #HFpEF #Acute hypoxic respiratory failure #Elevated troponin/demand ischemia -Admitted with dizziness and fatigue.  Was hypotensive in the ER.  Given lots of intravenous fluids.  Through the congestive heart failure.  Also with concerns for pneumonia. -Pneumonia has been ruled out.  Still with  pleural effusions.  Starting to pick up with diuresis. -CT scan with persistent pleural effusions.  Pulmonary felt this was not indicated to be drained.  They recommended to continue with diuresis. -Net -1 L.  3.2 L of urine output.  Her weights are down roughly 8 kg.  We will continue with diuresis today.  I have added Aldactone for potassium supplementation.  Also given her potassium supplement. -Stop antibiotics.  Appreciate pulmonary input.  #Neurogenic orthostatic hypotension -Rapid fluctuations of blood pressure.  Lactic acids are normal.  Sepsis work-up is negative. -Symptoms have improved on midodrine 5 mg 3 times daily.  We also have her on compression stockings.  This is helping.  #Hypertrophic obstructive cardiomyopathy -Gradients are stable.  -Does not appear to be worsening. -Continue metoprolol tartrate 25 mg twice daily.  #Anemia -Hemoglobin trending down.  No signs of bleeding.  This is really not helping. - GI evaluated her yesterday.  They are giving IV iron.  Hopefully they will pursue EGD and colonoscopy this admission. -Continue to hold Eliquis.  SCDs for DVT prophylaxis.  #Paroxysmal A. Fib -Maintaining sinus rhythm. -Holding Eliquis due to anemia.  #NSVT -BB -Mg 2.3 -replace K  FEN -no IVF -code: full -diet: heart healthy -dvt ppx: SCDs -dispo: Anticipate she will be euvolemic tomorrow.  Likely can go home after GI evaluation.  For questions or updates, please contact [place] Please consult [email or web address] for contact info under   Time Spent with Patient: I have spent a total of 35 minutes with patient reviewing hospital notes, telemetry, EKGs, labs and examining the patient as well as establishing an assessment and plan that was discussed with the patient.  > 50% of time was spent in direct patient care.    Signed, Addison Naegeli. Audie Box, MD, [place]  12/19/2020 9:53 AM

## 2020-12-19 NOTE — Progress Notes (Signed)
Physical Therapy Treatment Patient Details Name: Alexandria French MRN: [id number] DOB: March 11, 1951 Today's Date: 12/19/2020   History of Present Illness Pt is a 70 y.o. female who presented to the ED at [facility] on 12/14/20 with new-onset palpitations, SOB, and dizziness. Pt found to be hypotensive and admitted with acute hypoxic respiratory failure secondary to decompensated diastolic heart failure. S/p R/L heart cath and coronary angiography 9/16. PMH: hypertrophic obstructive cardiomyopathy, paroxysmal atrial fibrillation on Eliquis, sick sinus syndrome status post pacemaker, hypertension, nonobstructive CAD, diverticulosis    PT Comments    Pt sitting up on EoB on entry, agreeable to working with therapy. Pt states she is feeling much better today, with the compression hose and BP medication. Although overall BP is improved, pt continues to have slightly symptomatic drop in BP with ambulation. Pt continues to be mod I for bed mobility, independent for transfers and min guard for ambulation. Educated pt on graded exercise to build endurance. PT currently recommending HHPT to work on building endurance and improving balance. PT will continue to follow acutely.  Orthostatic BPs  Sitting 139/79  Standing  130/80  Sitting immediately after ambulation 104/78  After sitting 3 minutes 120/108       Recommendations for follow up therapy are one component of a multi-disciplinary discharge planning process, led by the attending physician.  Recommendations may be updated based on patient status, additional functional criteria and insurance authorization.  Follow Up Recommendations  Supervision for mobility/OOB;Home health PT     Equipment Recommendations  None recommended by PT       Precautions / Restrictions Precautions Precautions: Fall Precaution Comments: monitor BP/vitals Restrictions Weight Bearing Restrictions: No     Mobility  Bed Mobility Overal bed mobility: Modified Independent              General bed mobility comments: Pt able to transition supine > sit EOB with HOB elevated without assistance.    Transfers Overall transfer level: Independent Equipment used: None             General transfer comment: Pt able to transfer to stand multiple times without LOB.  Ambulation/Gait Ambulation/Gait assistance: Min guard Gait Distance (Feet): 100 Feet Assistive device: None Gait Pattern/deviations: Step-through pattern;Decreased stride length;Narrow base of support Gait velocity: reduced Gait velocity interpretation: 1.31 - 2.62 ft/sec, indicative of limited community ambulator General Gait Details: min guard for safety, pt with very narrow BoS, vc for wider BoS, pt notes feeling more unsteady than usual and an unsteady feeling, pt steadiness improved with distance,         Balance Overall balance assessment: Mild deficits observed, not formally tested                                          Cognition Arousal/Alertness: Awake/alert Behavior During Therapy: WFL for tasks assessed/performed Overall Cognitive Status: Within Functional Limits for tasks assessed                                           General Comments General comments (skin integrity, edema, etc.): SaO2 on RA >90%O2 throughout session, overall BP has improved however still drops with positional change      Pertinent Vitals/Pain Pain Assessment: No/denies pain     PT Goals (current goals can now be  found in the care plan section) Acute Rehab PT Goals Patient Stated Goal: to get better PT Goal Formulation: With patient Time For Goal Achievement: 12/24/20 Potential to Achieve Goals: Good Progress towards PT goals: Progressing toward goals    Frequency    Min 3X/week      PT Plan Discharge plan needs to be updated       AM-PAC PT "6 Clicks" Mobility   Outcome Measure  Help needed turning from your back to your side while in a flat  bed without using bedrails?: None Help needed moving from lying on your back to sitting on the side of a flat bed without using bedrails?: None Help needed moving to and from a bed to a chair (including a wheelchair)?: None Help needed standing up from a chair using your arms (e.g., wheelchair or bedside chair)?: None Help needed to walk in hospital room?: A Little Help needed climbing 3-5 steps with a railing? : A Little 6 Click Score: 22    End of Session Equipment Utilized During Treatment: Gait belt Activity Tolerance: Patient tolerated treatment well Patient left: with call bell/phone within reach;in bed;with family/visitor present Nurse Communication: Mobility status PT Visit Diagnosis: Unsteadiness on feet (R26.81);Other abnormalities of gait and mobility (R26.89);Difficulty in walking, not elsewhere classified (R26.2);Dizziness and giddiness (R42)     Time: 1236-1300 PT Time Calculation (min) (ACUTE ONLY): 24 min  Charges:  $Gait Training: 8-22 mins $Therapeutic Exercise: 8-22 mins                     Dryden Tapley B. Migdalia Dk PT, DPT Acute Rehabilitation Services Pager [phone] Office [phone]    [name] 12/19/2020, 1:34 PM

## 2020-12-19 NOTE — H&P (View-Only) (Signed)
          Daily Rounding Note  12/19/2020, 1:51 PM  LOS: 4 days   SUBJECTIVE:   Chief complaint:    anemia Received parenteral iron today.  No BM for several days.  Feels well.  Denies SOB  OBJECTIVE:         Vital signs in last 24 hours:    Temp:  [97.5 F (36.4 C)-99 F (37.2 C)] 97.8 F (36.6 C) (09/20 1214) Pulse Rate:  [65-72] 68 (09/20 1348) Resp:  [16-18] 18 (09/20 1214) BP: (94-134)/(54-66) 133/62 (09/20 1348) SpO2:  [95 %-98 %] 95 % (09/20 0739) Weight:  [61.3 kg] 61.3 kg (09/20 0458) Last BM Date: 12/17/20 (states only small BM on 18th, feels constipated) Filed Weights   12/15/20 1645 12/18/20 0523 12/19/20 0458  Weight: 69.7 kg 62.4 kg 61.3 kg   General: looks well   Heart: RRR Chest: clear bil Abdomen: soft, not tender or distended.  Active BS  Extremities: no CCE Neuro/Psych: Oriented x3.  Fully alert.  Appropriate.  Lab Results: Recent Labs    12/17/20 0338 12/18/20 0348 12/19/20 0215  WBC 10.0 6.1 5.1  HGB 8.5* 7.8* 8.1*  HCT 27.3* 24.3* 25.9*  PLT 220 209 251   BMET Recent Labs    12/17/20 0338 12/18/20 0348 12/19/20 0215  NA 138 135 136  K 4.2 3.6 3.4*  CL 104 97* 96*  CO2 28 28 29   GLUCOSE 112* 99 101*  BUN 11 15 18   CREATININE 0.93 0.99 0.97  CALCIUM 8.9 8.5* 9.2    ASSESMENT:   Normocytic anemia.  History of IDA on chronic oral iron.Hgb 8.1.  Low iron, low iron sat, borderline low ferritin.  Normal TIBC, Normal B12.  Intermittent minor bleeding per rectum in patient with moderately large hemorrhoids.  Diastolic CHF.     PAF.  Chronic Eliquis is on hold, last dose was 9/18.   PLAN   Secure chatted cardiologist to see if they are okay with [place] setting patient up for EGD, colonoscopy tomorrow.  Going to add additional laxatives (dulcolax 20 mg x 1) and may end up ordering a full on bowel prep depending on timing of GI procedures.  Change to clear diet.    Addendum at 2:09  PM.  Dr. Sallyanne Kuster says fine to proceed with EGD and colonoscopy so will modify the prep orders and notified Endo unit to schedule the case for tomorrow  Azucena Freed  12/19/2020, 1:51 PM Phone [place] Attending   I have taken an interval history, reviewed the chart and examined the patient. I agree with the Advanced Practitioner's note, impression and recommendations.  Majority the medical decision-making in the formulation of the assessment and plan were performed by me.  For EGD and colonoscopy tomorrow.  Gatha Mayer, MD, [facility] 12/19/2020 4:36 PM

## 2020-12-20 ENCOUNTER — Encounter (HOSPITAL_COMMUNITY)
Admission: EM | Disposition: A | Discharge status: Home Health | Payer: Self-pay | Source: Home / Self Care | Attending: Cardiovascular Disease

## 2020-12-20 ENCOUNTER — Inpatient Hospital Stay (HOSPITAL_COMMUNITY): Payer: Medicare PPO | Admitting: Anesthesiology

## 2020-12-20 ENCOUNTER — Encounter (HOSPITAL_COMMUNITY): Payer: Self-pay | Admitting: Internal Medicine

## 2020-12-20 DIAGNOSIS — C180 Malignant neoplasm of cecum: Secondary | ICD-10-CM

## 2020-12-20 DIAGNOSIS — I214 Non-ST elevation (NSTEMI) myocardial infarction: Secondary | ICD-10-CM | POA: Diagnosis not present

## 2020-12-20 HISTORY — PX: COLONOSCOPY WITH PROPOFOL: SHX5780

## 2020-12-20 HISTORY — PX: BIOPSY: SHX5522

## 2020-12-20 LAB — BASIC METABOLIC PANEL
Anion gap: 18 — ABNORMAL HIGH (ref 5–15)
BUN: 16 mg/dL (ref 8–23)
CO2: 23 mmol/L (ref 22–32)
Calcium: 10.4 mg/dL — ABNORMAL HIGH (ref 8.9–10.3)
Chloride: 97 mmol/L — ABNORMAL LOW (ref 98–111)
Creatinine, Ser: 1.29 mg/dL — ABNORMAL HIGH (ref 0.44–1.00)
GFR, Estimated: 45 mL/min — ABNORMAL LOW (ref >60)
Glucose, Bld: 110 mg/dL — ABNORMAL HIGH (ref 70–99)
Potassium: 3.6 mmol/L (ref 3.5–5.1)
Sodium: 138 mmol/L (ref 135–145)

## 2020-12-20 LAB — CBC
HCT: 29 % — ABNORMAL LOW (ref 36.0–46.0)
Hemoglobin: 9.5 g/dL — ABNORMAL LOW (ref 12.0–15.0)
MCH: 31.5 pg (ref 26.0–34.0)
MCHC: 32.8 g/dL (ref 30.0–36.0)
MCV: 96 fL (ref 80.0–100.0)
Platelets: 313 10*3/uL (ref 150–400)
RBC: 3.02 MIL/uL — ABNORMAL LOW (ref 3.87–5.11)
RDW: 13.5 % (ref 11.5–15.5)
WBC: 7.1 10*3/uL (ref 4.0–10.5)
nRBC: 0.6 % — ABNORMAL HIGH (ref 0.0–0.2)

## 2020-12-20 LAB — CULTURE, BLOOD (ROUTINE X 2)
Culture: NO GROWTH
Culture: NO GROWTH
Special Requests: ADEQUATE
Special Requests: ADEQUATE

## 2020-12-20 LAB — MAGNESIUM: Magnesium: 2.3 mg/dL (ref 1.7–2.4)

## 2020-12-20 SURGERY — COLONOSCOPY WITH PROPOFOL
Anesthesia: Monitor Anesthesia Care

## 2020-12-20 MED ORDER — PHENYLEPHRINE 40 MCG/ML (10ML) SYRINGE FOR IV PUSH (FOR BLOOD PRESSURE SUPPORT)
PREFILLED_SYRINGE | INTRAVENOUS | Status: DC | PRN
  Administered 2020-12-20 (×2): 80 ug via INTRAVENOUS
  Administered 2020-12-20: 40 ug via INTRAVENOUS

## 2020-12-20 MED ORDER — HYDROCORTISONE ACETATE 25 MG RE SUPP
25.0000 mg | Freq: Two times a day (BID) | RECTAL | Status: DC
  Administered 2020-12-21: 25 mg via RECTAL
  Filled 2020-12-20 (×12): qty 1

## 2020-12-20 MED ORDER — LACTATED RINGERS IV SOLN: INTRAVENOUS | Status: DC | PRN

## 2020-12-20 MED ORDER — PROPOFOL 500 MG/50ML IV EMUL
INTRAVENOUS | Status: DC | PRN
  Administered 2020-12-20: 125 ug/kg/min via INTRAVENOUS

## 2020-12-20 MED ORDER — ZOLPIDEM TARTRATE 5 MG PO TABS: 5.0000 mg | ORAL_TABLET | Freq: Every evening | ORAL | Status: DC | PRN

## 2020-12-20 MED ORDER — LIDOCAINE 2% (20 MG/ML) 5 ML SYRINGE
INTRAMUSCULAR | Status: DC | PRN
  Administered 2020-12-20: 80 mg via INTRAVENOUS

## 2020-12-20 MED ORDER — PROPOFOL 10 MG/ML IV BOLUS
INTRAVENOUS | Status: DC | PRN
  Administered 2020-12-20: 80 mg via INTRAVENOUS

## 2020-12-20 SURGICAL SUPPLY — 25 items

## 2020-12-20 NOTE — Transfer of Care (Signed)
Immediate Anesthesia Transfer of Care Note  Patient: Alexandria French  Procedure(s) Performed: COLONOSCOPY WITH PROPOFOL BIOPSY  Patient Location: PACU  Anesthesia Type:MAC  Level of Consciousness: awake, alert  and oriented  Airway & Oxygen Therapy: Patient Spontanous Breathing  Post-op Assessment: Report given to RN and Post -op Vital signs reviewed and stable  Post vital signs: Reviewed and stable  Last Vitals:  Vitals Value Taken Time  BP 108/73 12/20/20 0952  Temp    Pulse 81 12/20/20 0959  Resp 19 12/20/20 0959  SpO2 96 % 12/20/20 0959  Vitals shown include unvalidated device data.  Last Pain:  Vitals:   12/20/20 0952  TempSrc:   PainSc: 0-No pain      Patients Stated Pain Goal: 0 (73/40/37 0964)  Complications: No notable events documented.

## 2020-12-20 NOTE — Anesthesia Preprocedure Evaluation (Signed)
Anesthesia Evaluation  Patient identified by MRN, date of birth, ID band Patient awake    Airway Mallampati: II  TM Distance: >3 FB Neck ROM: Full    Dental  (+) Chipped   Pulmonary neg pulmonary ROS,    Pulmonary exam normal        Cardiovascular hypertension, Pt. on medications and Pt. on home beta blockers + CAD and + Past MI  + dysrhythmias Atrial Fibrillation  Rhythm:Irregular Rate:Normal     Neuro/Psych negative neurological ROS  negative psych ROS   GI/Hepatic Neg liver ROS, GERD  ,Hemorrhoids    Endo/Other  negative endocrine ROS  Renal/GU negative Renal ROS  negative genitourinary   Musculoskeletal negative musculoskeletal ROS (+)   Abdominal (+)  Abdomen: soft.    Peds  Hematology  (+) anemia ,   Anesthesia Other Findings   Reproductive/Obstetrics                             Anesthesia Physical Anesthesia Plan  ASA: 3  Anesthesia Plan: MAC   Post-op Pain Management:    Induction: Intravenous  PONV Risk Score and Plan: 2 and Treatment may vary due to age or medical condition  Airway Management Planned: Simple Face Mask, Natural Airway and Nasal Cannula  Additional Equipment: None  Intra-op Plan:   Post-operative Plan:   Informed Consent: I have reviewed the patients History and Physical, chart, labs and discussed the procedure including the risks, benefits and alternatives for the proposed anesthesia with the patient or authorized representative who has indicated his/her understanding and acceptance.     Dental advisory given  Plan Discussed with: CRNA  Anesthesia Plan Comments: (Lab Results      Component                Value               Date                      WBC                      7.1                 12/20/2020                HGB                      9.5 (L)             12/20/2020                HCT                      29.0 (L)             12/20/2020                MCV                      96.0                12/20/2020                PLT                      313  12/20/2020           Lab Results      Component                Value               Date                      NA                       138                 12/20/2020                K                        3.6                 12/20/2020                CO2                      23                  12/20/2020                GLUCOSE                  110 (H)             12/20/2020                BUN                      16                  12/20/2020                CREATININE               1.29 (H)            12/20/2020                CALCIUM                  10.4 (H)            12/20/2020                GFRNONAA                 45 (L)              12/20/2020                GFRAA                    88                  02/16/2020            ECHO 09/22: 1. Left ventricular ejection fraction, by estimation, is 55-60%. The left  ventricle demonstrates regional wall motion abnormalities that are  consistent with a left bundle branch block.There is severe asymmetric left  ventricular hypertrophy. Left  ventricular diastolic parameters are consistent with Grade I diastolic  dysfunction (impaired relaxation). No significant LVOT Obstructive seen in  this study.  2. Right ventricular systolic function  is normal. The right ventricular  size is normal. There is normal pulmonary artery systolic pressure.  3. The mitral valve is normal in structure. Mild mitral valve  regurgitation. No evidence of mitral stenosis.  4. The aortic valve was not well visualized. Aortic valve regurgitation  is trivial. No aortic stenosis is present.  5. The inferior vena cava is normal in size with <50% respiratory  variability, suggesting right atrial pressure of 8 mmHg.  6. Technically difficult acquisition; in future studies consider use of  echo-contrast. )         Anesthesia Quick Evaluation

## 2020-12-20 NOTE — Progress Notes (Signed)
Dr. Lethea Killings updated via secure chat, pt had brief episode of HR in the 130-200 range, felt dizzy, some blurry vision and mild SOB. BP 134/75, HR currently at 83.  Pt also added she felt warm when all this was happenning.

## 2020-12-20 NOTE — Op Note (Addendum)
[facility] Patient Name: Alexandria French Procedure Date : 12/20/2020 MRN: [id number] Attending MD: Gatha Mayer , MD Date of Birth: Jan 21, 1951 CSN: [id number] Age: 70 Admit Type: Inpatient Procedure:                Colonoscopy Indications:              Iron deficiency anemia secondary to chronic blood                            loss Providers:                Gatha Mayer, MD, Mariana Arn, Tyna Jaksch                            Technician Referring MD:              Medicines:                Propofol per Anesthesia, Monitored Anesthesia Care Complications:            No immediate complications. Estimated Blood Loss:     Estimated blood loss was minimal. Procedure:                Pre-Anesthesia Assessment:                           - Prior to the procedure, a History and Physical                            was performed, and patient medications and                            allergies were reviewed. The patient's tolerance of                            previous anesthesia was also reviewed. The risks                            and benefits of the procedure and the sedation                            options and risks were discussed with the patient.                            All questions were answered, and informed consent                            was obtained. Prior Anticoagulants: The patient                            last took Eliquis (apixaban) 5 days prior to the                            procedure. ASA Grade Assessment: III - A patient  with severe systemic disease. After reviewing the                            risks and benefits, the patient was deemed in                            satisfactory condition to undergo the procedure.                           After obtaining informed consent, the colonoscope                            was passed under direct vision. Throughout the                            procedure, the  patient's blood pressure, pulse, and                            oxygen saturations were monitored continuously. The                            PCF-190TL ([id number] Olympus colonoscope was                            introduced through the anus and advanced to the the                            cecum, identified by appendiceal orifice and                            ileocecal valve. The colonoscopy was performed                            without difficulty. The patient tolerated the                            procedure well. The quality of the bowel                            preparation was good. The ileocecal valve,                            appendiceal orifice, and rectum were photographed.                            The bowel preparation used was MoviPrep via split                            dose instruction. Scope In: 9:34:46 AM Scope Out: 9:47:11 AM Total Procedure Duration: 0 hours 12 minutes 25 seconds  Findings:      Hemorrhoids were found on perianal exam.      The digital rectal exam was normal.      A sessile, submucosal and ulcerated non-obstructing small mass was found  in the cecum. The mass was non-circumferential. This was biopsied with a       cold forceps for histology. Verification of patient identification for       the specimen was done. Estimated blood loss was minimal.      Many small and large-mouthed diverticula were found in the entire colon.      External and internal hemorrhoids were found.      The exam was otherwise without abnormality on direct and retroflexion       views. Impression:               - Hemorrhoids found on perianal exam.                           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                           - Severe diverticulosis in the entire examined                            colon.                           - External and internal hemorrhoids.                           - The examination was otherwise normal on direct                             and retroflexion views. Recommendation:           - Return patient to hospital ward for ongoing care.                           - Await pathology results.                           - Clear liquid diet.                           - Needs surgery consult - I contacted                           - Needs CT abd/pelvis w/ contrast (CT A of chest                            and non-contrast chest CT negative for mets this                            admit) - but rising creatinine so I did not order.                           - CEA ordered                           - called husband and explained                           -  continue to hold Eliquis Procedure Code(s):        --- Professional ---                           [phone], Colonoscopy, flexible; with biopsy, single                            or multiple Diagnosis Code(s):        --- Professional ---                           K64.8, Other hemorrhoids                           C18.0, Malignant neoplasm of cecum                           D50.0, Iron deficiency anemia secondary to blood                            loss (chronic)                           K57.30, Diverticulosis of large intestine without                            perforation or abscess without bleeding CPT copyright 2019 American Medical Association. All rights reserved. The codes documented in this report are preliminary and upon coder review may  be revised to meet current compliance requirements. Gatha Mayer, MD 12/20/2020 10:13:33 AM This report has been signed electronically. Number of Addenda: 0

## 2020-12-20 NOTE — Anesthesia Postprocedure Evaluation (Signed)
Anesthesia Post Note  Patient: Alexandria French  Procedure(s) Performed: COLONOSCOPY WITH PROPOFOL BIOPSY     Patient location during evaluation: Endoscopy Anesthesia Type: MAC Level of consciousness: awake and alert Pain management: pain level controlled Vital Signs Assessment: post-procedure vital signs reviewed and stable Respiratory status: spontaneous breathing, nonlabored ventilation, respiratory function stable and patient connected to nasal cannula oxygen Cardiovascular status: stable and blood pressure returned to baseline Postop Assessment: no apparent nausea or vomiting Anesthetic complications: no   No notable events documented.  Last Vitals:  Vitals:   12/20/20 1013 12/20/20 1142  BP: 97/62 118/61  Pulse:  79  Resp: 18   Temp:    SpO2:      Last Pain:  Vitals:   12/20/20 0952  TempSrc: Oral  PainSc: 0-No pain                 March Rummage Zeus Marquis

## 2020-12-20 NOTE — Interval H&P Note (Signed)
History and Physical Interval Note:  12/20/2020 9:17 AM  Alexandria French  has presented today for surgery, with the diagnosis of Evaluate for source of iron deficiency anemia.  History of minor bleeding per rectum attributed to hemorrhoids..  The various methods of treatment have been discussed with the patient and family. After consideration of risks, benefits and other options for treatment, the patient has consented to  Procedure(s): COLONOSCOPY WITH PROPOFOL (N/A) ESOPHAGOGASTRODUODENOSCOPY (EGD) WITH PROPOFOL (N/A) as a surgical intervention.  The patient's history has been reviewed, patient examined, no change in status, stable for surgery.  I have reviewed the patient's chart and labs.  Questions were answered to the patient's satisfaction.     Silvano Rusk

## 2020-12-20 NOTE — Progress Notes (Signed)
PT Cancellation Note  Patient Details Name: Alexandria French MRN: [id number] DOB: 07/19/1950   Cancelled Treatment:    Reason Eval/Treat Not Completed: Patient at procedure or test/unavailable.  Pt is in endoscopy and will see at another time.   Ramond Dial 12/20/2020, 10:38 AM  Mee Hives, PT MS Acute Rehab Dept. Number: [place] and [place]

## 2020-12-20 NOTE — Progress Notes (Addendum)
Transfer of service summary:   70 year old female with PMH significant of hypertrophic obstructive cardiomyopathy, paroxysmal A. fib on Eliquis, sick sinus syndrome s/p PPM, nonobstructive CAD, who presented to the hospital on 12/14/2020 for dizziness, hypotension, SOB. She was subsequently admitted to cardiology service for acute hypoxic respiratory failure secondary to pulmonary edema secondary to decompensated heart failure with preserved EF.  She was treated briefly for pneumonia until sepsis/PNA was ruled out.  She underwent right and left heart catheterization on 12/15/2020, normal coronary arteries,  HOCM gradients are stable and does not appear worsened.  She was continued on metoprolol 25 mg BID, given diuresis gently/limited by hypotension. Due to persistent pleural effusion, pulmonology was consulted and does not feel patient required thoracentesis. She is now completed IV diuresing, labs revealed mild AKI, lasix has been stopped on 12/20/20.  Due to iron deficiency anemia, GI was consulted, patient underwent colonoscopy on 12/20/2020, hemorrhoids, malignant tumor of cecum, severe diverticulosis of entire colon.  General surgery was consulted, patient was planned for laparoscopic R colon on Friday 12/22/20.  As patient is currently hemodynamically stable and has resolved cardiac issues, patient is recommended transfer to hospital medicine service for primary care today per Dr Audie Box. [facility] triage hospitalist Dr. Roosevelt Locks, veral report provided today, patient is accepted for transfer. Cardiology will continue follow patient while in house and see progress notes for pre-op cardiac risk evaluation.    Margie Billet, ACNP-BC 12/20/20  1:23PM

## 2020-12-20 NOTE — Consult Note (Signed)
[facility] Medical Consultation  Mattingly Fountaine PIR:[id number] DOB: March 25, 1951 DOA: 12/14/2020 PCP: Kinney Nation, MD   Requesting physician: Dr. Jenetta Downer' neal Date of consultation: 12/20/2020 Reason for consultation: Colon CA  Impression/Recommendations Principal Problem:   NSTEMI (non-ST elevated myocardial infarction) [facility]) Active Problems:   Chest pain   LBBB (left bundle branch block)   SSS (sick sinus syndrome) (HCC)   HOCM (hypertrophic obstructive cardiomyopathy) ([place])   Hypotension   Respiratory failure (HCC)   Absolute anemia   Heme positive stool   Hemorrhoids with complication    S/P decompensated acute on chronic diastolic CHF secondary to [facility], clinically appears to be euvolemic, off Lasix today due to worsening of Cre level, tomorrow's BMP and volume status to decide further diuresis indicated. HCOM, BP stable, patient not complaining about lightheadedness or blurry vision, continue twice daily metoprolol.  Left-sided cath showed moderately reduced cardiac output. BP was low and midodrine was started, and patient tolerated well. Status post symptomatic hypotension, secondary to decompensated HCOM, managed with a combination of beta-blocker and midodrine. NSTEMI, s/p cath which showed nonobstructive CAD. Cecum mass and chronic iron deficiency anemia, as per surgical plan, CT scan today/tomorrow then OR on Friday, continue clear liquid diet today. PAF, paced rhythm, Eliquis on hold for Friday surgery. SSS on chronic PPM, on paced rhythm.  I will followup again tomorrow. Please contact me if I can be of assistance in the meanwhile. Thank you for this consultation.  Chief Complaint: Feeling ok  HPI:  70 year old female patient with past medical history of hypertrophic obstructive cardiomyopathy, PAF on Eliquis, SSS s/p PPM, CAD presented with increasing shortness of breath, and near syncope.  Patient presented on 12/14/2020 for evaluation of worsening of  increasing shortness of breath, feeling dizzy for 3 days.  On presentation, patient also complained about a new onset of chest pain that radiated to right arm.  ED work-up showed patient blood pressure low, initially suspected sepsis, x-ray shows pulmonary edema versus pneumonia.  Blood work showed troponin elevated 593> 502.  CTA negative for PE.  Patient received a total of 5 days Levaquin which was discontinued on 09/19 for supposedly CAP on top of pulmonary edema.  And patient was one-time admitted to ICU for phenylephrine drip to support IV diuresis given low blood pressure, which switched to midodrine later and BP has remained stable although on the lower end.  During this admission, patient underwent left-sided cardiac cath which showed normal coronary arteries, significant apical LV pressure and moderate decreased cardiac output index, and patient has been aggressively diuresed until today, when Cre level increased.  On admission, due to small patient has iron deficiency anemia, and colonoscopy today showed cecum mass suspicious for malignancy.  General surgery consulted and plans for OR on Friday.  Cardiac requested that patient transferred to under hospitalist service.  Patient has no major complaints this time, other than flare-up of hemorrhoid, she has no abdominal pain, no rectal bleed.  Denies any chest pain no shortness of breath.   Review of Systems:  14 points of review systems done negative, except those mentioned in HPI.  Past Medical History:  Diagnosis Date   A-fib [facility])    CAD (coronary artery disease)    Diverticulosis    Esophageal reflux    no meds   External hemorrhoids    Gastritis    Hypertension    IDA (iron deficiency anemia)    Internal hemorrhoids    Other and unspecified hyperlipidemia    SSS (  sick sinus syndrome) [facility])    Past Surgical History:  Procedure Laterality Date   ABDOMINAL HYSTERECTOMY  08/2008   BLADDER REPAIR     PACEMAKER IMPLANT N/A  02/28/2020   Procedure: PACEMAKER IMPLANT;  Surgeon: Sanda Klein, MD;  Location: [place] [facility];  Service: Cardiovascular;  Laterality: N/A;   RIGHT/LEFT HEART CATH AND CORONARY ANGIOGRAPHY N/A 12/15/2020   Procedure: RIGHT/LEFT HEART CATH AND CORONARY ANGIOGRAPHY;  Surgeon: Leonie Man, MD;  Location: [place] [facility];  Service: Cardiovascular;  Laterality: N/A;   TONSILLECTOMY     Social History:  reports that she has never smoked. She has never used smokeless tobacco. She reports that she does not drink alcohol and does not use drugs.  Allergies  Allergen Reactions   Cefzil [Cefprozil] Other (See Comments)    angioedema   Family History  Problem Relation Age of Onset   Heart disease Other    Osteoporosis Other    Arthritis Other    Colon cancer Neg Hx    Colon polyps Neg Hx     Prior to Admission medications   Medication Sig Start Date End Date Taking? Authorizing Provider  acetaminophen (TYLENOL) 500 MG tablet Take 500-1,000 mg by mouth 2 (two) times daily as needed (back pain.).   Yes [provider]  apixaban (ELIQUIS) 5 MG TABS tablet TAKE 1 TABLET(5 MG) BY MOUTH TWICE DAILY Patient taking differently: Take 5 mg by mouth 2 (two) times daily. 12/08/20  Yes Croitoru, Mihai, MD  bisoprolol (ZEBETA) 10 MG tablet Take 10 mg every morning and (1/2 tablet) 5 mg every afternoon Patient taking differently: Take 5-10 mg by mouth See admin instructions. 10 mg in the morning  5 mg in the evening 12/01/20  Yes Croitoru, Mihai, MD  Calcium Carbonate-Vitamin D 600-400 MG-UNIT tablet Take 1 tablet by mouth 2 (two) times daily.    Yes [provider]  cetirizine (ZYRTEC) 10 MG tablet Take 10 mg by mouth every evening.    Yes [provider]  Coenzyme Q10 (COQ-10) 100 MG CAPS Take 100 mg by mouth daily.    Yes [provider]  diltiazem (DILACOR XR) 120 MG 24 hr capsule Take 1 capsule (120 mg total) by mouth daily. 10/20/20  Yes Loel Dubonnet, NP   ferrous sulfate 325 (65 FE) MG EC tablet Take 325 mg by mouth in the morning and at bedtime.   Yes [provider]  LORazepam (ATIVAN) 1 MG tablet Take 0.5 mg by mouth See admin instructions. Every other night   Yes [provider]  simvastatin (ZOCOR) 20 MG tablet Take 20 mg by mouth daily.   Yes [provider]  vitamin C (ASCORBIC ACID) 500 MG tablet Take 500 mg by mouth daily.   Yes [provider]  bisoprolol (ZEBETA) 5 MG tablet TAKE 1/2 TABLET BY MOUTH DAILY Patient not taking: No sig reported 12/01/20   Croitoru, Mihai, MD  Sodium Sulfate-Mag Sulfate-KCl (SUTAB) [id number] MG TABS Use as directed for colonoscopy. MANUFACTURER CODES!! BIN: [id number] PCN: CN GROUP: [id number] MEMBER ID: [id number];[employer] AS SECONDARY INSURANCE ;NO PRIOR AUTHORIZATION 10/23/20   Pyrtle, Lajuan Lines, MD   Physical Exam: Blood pressure 118/61, pulse 79, temperature 98.7 F (37.1 C), temperature source Oral, resp. rate 18, height 5\' 2"  (1.575 m), weight 59.5 kg, SpO2 97 %. Vitals:   12/20/20 1013 12/20/20 1142  BP: 97/62 118/61  Pulse:  79  Resp: 18   Temp:    SpO2:  General: No acute distress Eyes: PERRL  Neck: Supple no JVD Cardiovascular: Paced, regular rhythm Respiratory: Clear breathing, no crackle no wheezing Abdomen: Soft nontender nondistended Skin: No rash, no ulcers Musculoskeletal: Nontender, normal ROM Psychiatric: Calm Neurologic: No focal deficit  Labs on Admission:  Basic Metabolic Panel: Recent Labs  Lab 12/16/20 0127 12/17/20 0338 12/18/20 0348 12/19/20 0215 12/20/20 0217  NA 137 138 135 136 138  K 3.8 4.2 3.6 3.4* 3.6  CL 102 104 97* 96* 97*  CO2 25 28 28 29 23   GLUCOSE 116* 112* 99 101* 110*  BUN 9 11 15 18 16   CREATININE 0.93 0.93 0.99 0.97 1.29*  CALCIUM 9.2 8.9 8.5* 9.2 10.4*  MG 1.7 2.0 2.4 2.3 2.3   Liver Function Tests: No results for input(s): AST, ALT, ALKPHOS, BILITOT, PROT, ALBUMIN in the last 168 hours. No results for  input(s): LIPASE, AMYLASE in the last 168 hours. No results for input(s): AMMONIA in the last 168 hours. CBC: Recent Labs  Lab 12/15/20 1601 12/16/20 0127 12/17/20 0338 12/18/20 0348 12/19/20 0215 12/20/20 0449  WBC 12.2* 12.8* 10.0 6.1 5.1 7.1  NEUTROABS 11.2*  --   --   --   --   --   HGB 10.3* 9.7* 8.5* 7.8* 8.1* 9.5*  HCT 32.0* 30.1* 27.3* 24.3* 25.9* 29.0*  MCV 98.8 98.0 99.3 98.4 98.1 96.0  PLT 288 314 220 209 251 313   Cardiac Enzymes: No results for input(s): CKTOTAL, CKMB, CKMBINDEX, TROPONINI in the last 168 hours. BNP: Invalid input(s): POCBNP CBG: Recent Labs  Lab 12/15/20 1400  GLUCAP 114*    Radiological Exams on Admission: No results found.  EKG: Independently reviewed. Paced  Time spent: [facility] Pager [phone] 12/20/2020, 1:43 PM

## 2020-12-20 NOTE — Progress Notes (Signed)
Cardiology Progress Note  [name] MRN: [id number] DOB: [birth year] Date of Encounter: 12/20/2020  Primary Cardiologist: Sanda Klein, MD  Subjective   Chief Complaint: SOB  HPI: Breathing much improved. Cecal mass on colonoscopy.   ROS:  All other ROS reviewed and negative. Pertinent positives noted in the HPI.     Inpatient Medications  Scheduled Meds:  [MAR Hold] vitamin C  500 mg Oral Daily   [MAR Hold] calcium-vitamin D  1 tablet Oral BID   [MAR Hold] loratadine  10 mg Oral Daily   [MAR Hold] LORazepam  0.5 mg Oral Q48H   [MAR Hold] melatonin  3 mg Oral QHS   [MAR Hold] metoprolol tartrate  25 mg Oral BID   [MAR Hold] midodrine  5 mg Oral TID WC   [MAR Hold] polyethylene glycol  17 g Oral BID   [MAR Hold] simvastatin  20 mg Oral Daily   sodium chloride flush  3 mL Intravenous Q12H   [MAR Hold] sodium chloride flush  3 mL Intravenous Q12H   Continuous Infusions:  sodium chloride     PRN Meds: sodium chloride, [MAR Hold] acetaminophen, [MAR Hold] ondansetron (ZOFRAN) IV, sodium chloride flush, [MAR Hold] sodium chloride flush   Vital Signs   Vitals:   12/20/20 0847 12/20/20 0952 12/20/20 1004 12/20/20 1013  BP: 129/67 108/73 134/79 97/62  Pulse: 81 80 79   Resp: (!) 21 20 18 18   Temp: 98.4 F (36.9 C) 98.7 F (37.1 C)    TempSrc: Oral Oral    SpO2: 94% 96% 97%   Weight:      Height:        Intake/Output Summary (Last 24 hours) at 12/20/2020 1026 Last data filed at 12/20/2020 0946 Gross per 24 hour  Intake 1770 ml  Output 1350 ml  Net 420 ml   Last 3 Weights 12/20/2020 12/19/2020 12/18/2020  Weight (lbs) 131 lb 1.6 oz 135 lb 3.2 oz 137 lb 9.1 oz  Weight (kg) 59.467 kg 61.326 kg 62.4 kg      Telemetry  Overnight telemetry shows sinus rhythm in the 80s, which I personally reviewed.   Physical Exam   Vitals:   12/20/20 0847 12/20/20 0952 12/20/20 1004 12/20/20 1013  BP: 129/67 108/73 134/79 97/62  Pulse: 81 80 79   Resp: (!) 21 20  18 18   Temp: 98.4 F (36.9 C) 98.7 F (37.1 C)    TempSrc: Oral Oral    SpO2: 94% 96% 97%   Weight:      Height:        Intake/Output Summary (Last 24 hours) at 12/20/2020 1026 Last data filed at 12/20/2020 0946 Gross per 24 hour  Intake 1770 ml  Output 1350 ml  Net 420 ml    Last 3 Weights 12/20/2020 12/19/2020 12/18/2020  Weight (lbs) 131 lb 1.6 oz 135 lb 3.2 oz 137 lb 9.1 oz  Weight (kg) 59.467 kg 61.326 kg 62.4 kg    Body mass index is 23.98 kg/m.   General: Well nourished, well developed, in no acute distress Head: Atraumatic, normal size  Eyes: PEERLA, EOMI  Neck: Supple, no JVD Endocrine: No thryomegaly Cardiac: Normal S1, S2; RRR; 3/6 systolic ejection murmur Lungs: Clear to auscultation bilaterally, no wheezing, rhonchi or rales  Abd: Soft, nontender, no hepatomegaly  Ext: No edema, pulses 2+ Musculoskeletal: No deformities, BUE and BLE strength normal and equal Skin: Warm and dry, no rashes   Neuro: Alert and oriented to person, place, time, and situation,  CNII-XII grossly intact, no focal deficits  Psych: Normal mood and affect   Labs  High Sensitivity Troponin:   Recent Labs  Lab 12/14/20 2130 12/14/20 2345 12/15/20 0618 12/15/20 0817  TROPONINIHS 593* 502* 601* 652*     Cardiac EnzymesNo results for input(s): TROPONINI in the last 168 hours. No results for input(s): TROPIPOC in the last 168 hours.  Chemistry Recent Labs  Lab 12/18/20 0348 12/19/20 0215 12/20/20 0217  NA 135 136 138  K 3.6 3.4* 3.6  CL 97* 96* 97*  CO2 28 29 23   GLUCOSE 99 101* 110*  BUN 15 18 16   CREATININE 0.99 0.97 1.29*  CALCIUM 8.5* 9.2 10.4*  GFRNONAA >60 >60 45*  ANIONGAP 10 11 18*    Hematology Recent Labs  Lab 12/18/20 0348 12/19/20 0215 12/20/20 0449  WBC 6.1 5.1 7.1  RBC 2.47* 2.64* 3.02*  HGB 7.8* 8.1* 9.5*  HCT 24.3* 25.9* 29.0*  MCV 98.4 98.1 96.0  MCH 31.6 30.7 31.5  MCHC 32.1 31.3 32.8  RDW 13.5 13.4 13.5  PLT 209 251 313   BNP Recent Labs  Lab  12/15/20 0817  BNP 1,765.0*    DDimer No results for input(s): DDIMER in the last 168 hours.   Radiology  No results found.  Cardiac Studies  TTE 12/15/2020   1. Left ventricular ejection fraction, by estimation, is 55-60%. The left  ventricle demonstrates regional wall motion abnormalities that are  consistent with a left bundle branch block.There is severe asymmetric left  ventricular hypertrophy. Left  ventricular diastolic parameters are consistent with Grade I diastolic  dysfunction (impaired relaxation). No significant LVOT Obstructive seen in  this study.   2. Right ventricular systolic function is normal. The right ventricular  size is normal. There is normal pulmonary artery systolic pressure.   3. The mitral valve is normal in structure. Mild mitral valve  regurgitation. No evidence of mitral stenosis.   4. The aortic valve was not well visualized. Aortic valve regurgitation  is trivial. No aortic stenosis is present.   5. The inferior vena cava is normal in size with <50% respiratory  variability, suggesting right atrial pressure of 8 mmHg.   6. Technically difficult acquisition; in future studies consider use of  echo-contrast.   RHC/LHC 12/15/2020 SUMMARY Angiographically Normal Coronary arteries with a left dominant system. Significant LVOT gradient with apical LV pressures of 184/12 mmHg with an EDP of 25, mid LV cavity 157/9 mmHg with EDP of 25 and outflow tract pressure of 111/9 mmHg with a stable EDP of 24 mmHg. Mild Secondary Pulmonary Hypertension with mean PAP of 29 mmHg and PCWP of 29 mmHg. Moderately reduced cardiac function with a cardiac output-index of 3.26 and 2.02 by Fick and 3.55-2.2 by thermodilution.  Patient Profile  Alexandria French is a 70 y.o. female with hypertrophic obstructive cardiomyopathy, paroxysmal atrial fibrillation on Eliquis, sick sinus syndrome status post pacemaker, hypertension, nonobstructive CAD who was admitted on 12/15/2020 with  dizziness acute hypoxic respiratory failure secondary to decompensated diastolic heart failure.  Assessment & Plan   #HFpEF #Acute proximal respiratory failure #Elevated troponin/demand ischemia -Admitted with dizziness and fatigue.  Was hypotensive in the ER.  Suspect this is all related to anemia.  Her hypertension has been rather chronic. -She is ruled out for pneumonia.  No signs of infection.  Evaluated by pulmonary. -She has been diuresed and is euvolemic.  No further diuresis. -Her hypotension is likely related to neurogenic orthostatic hypotension. -Now found to have colon cancer.  Suspect this was contributing. -Hold further diuresis. -wt down 69->59 kg.   #Neurogenic Orthostatic hypotension -Symptoms have improved on midodrine.  Also with compression stockings. -Ortho stasis has resolved.  #Hypertrophic cardiomyopathy -Gradients are stable.  Continue metoprolol tartrate 25 twice daily.  #Iron deficiency anemia #Colon cancer -Hemoglobin continues to drop.  Suspect this was a big contributor of her symptoms. -Now found to have colon cancer. -Continue to hold Eliquis. -GI has consulted general surgery.  Suspect she will need a CT scan.  #Paroxysmal A. Fib -Maintaining sinus rhythm. -Holding Eliquis due to anemia.  Now found to have colon cancer.  #AKI -Secondary to overdiuresis.  Continue to hold this.  #FEN -no IVF -code: full -diet: heart healthy -dvt ppx: SCDs -dispo: DC pending Gen Surgery evaluation    For questions or updates, please contact [place] Please consult [email or web address] for contact info under   Time Spent with Patient: I have spent a total of 35 minutes with patient reviewing hospital notes, telemetry, EKGs, labs and examining the patient as well as establishing an assessment and plan that was discussed with the patient.  > 50% of time was spent in direct patient care.    Signed, Addison Naegeli. Audie Box, MD, [place]   12/20/2020 10:26 AM

## 2020-12-20 NOTE — Consult Note (Signed)
[facility] Surgery ConsultNote  Alexandria French 05/28/1950  [id number].    Requesting MD: Silvano Rusk Chief Complaint/Reason for Consult: cecal mass  HPI:  Alexandria French is a 70yo female with multiple medical problems including hypertrophic obstructive cardiomyopathy, paroxysmal A. fib on Eliquis (last dose prior to admission), sick sinus syndrome s/p PPM, and nonobstructive CAD who was admitted to [facility] 12/14/20 for acute hypoxic respiratory failure secondary to pulmonary edema secondary to decompensated heart failure with preserved EF. She came to the ED complaining of weakness/ fatigue. Troponin was elevated and she was taken for cardiac catheterization confirming the suspicion that her elevated troponin was due to demand ischemia. Patient was admitted to the cardiology service and she has been undergoing diuresis.  She was also found to have iron deficiency anemia therefore GI was consulted and took the patient for a colonoscopy today revealing a malignant appearing tumor of the cecum. Previously her last colonoscopy was 12/2019 completed only to the splenic flexure due to tortuosity and cardiac instability (bradycardia) requiring termination of procedure. She had a pacemaker placed after this for SSS. No prior hx of colonoscopy before 2021.  Patient states that her stools have been very dark prior to admission due to supplemental iron. She denies any abdominal pain, nausea, vomiting, constipation. She has had a good appetite and her weight is stable within 5lb. No family hx of colon cancer.  General surgery is asked to see to discuss right colectomy.   Abdominal surgical history: hysterectomy Nonsmoker Denies alcohol or illicit drug use Employment: Retired  Review of Systems  Gastrointestinal:  Positive for blood in stool and melena.  All other systems reviewed and are negative. All systems reviewed and otherwise negative except for as above  Family History  Problem Relation Age of  Onset   Heart disease Other    Osteoporosis Other    Arthritis Other    Colon cancer Neg Hx    Colon polyps Neg Hx     Past Medical History:  Diagnosis Date   A-fib (HCC)    CAD (coronary artery disease)    Diverticulosis    Esophageal reflux    no meds   External hemorrhoids    Gastritis    Hypertension    IDA (iron deficiency anemia)    Internal hemorrhoids    Other and unspecified hyperlipidemia    SSS (sick sinus syndrome) ([place])     Past Surgical History:  Procedure Laterality Date   ABDOMINAL HYSTERECTOMY  08/2008   BLADDER REPAIR     PACEMAKER IMPLANT N/A 02/28/2020   Procedure: PACEMAKER IMPLANT;  Surgeon: Sanda Klein, MD;  Location: [place] [facility];  Service: Cardiovascular;  Laterality: N/A;   RIGHT/LEFT HEART CATH AND CORONARY ANGIOGRAPHY N/A 12/15/2020   Procedure: RIGHT/LEFT HEART CATH AND CORONARY ANGIOGRAPHY;  Surgeon: Leonie Man, MD;  Location: [place] [facility];  Service: Cardiovascular;  Laterality: N/A;   TONSILLECTOMY      Social History:  reports that she has never smoked. She has never used smokeless tobacco. She reports that she does not drink alcohol and does not use drugs.  Allergies:  Allergies  Allergen Reactions   Cefzil [Cefprozil] Other (See Comments)    angioedema    Facility-Administered Medications Prior to Admission  Medication Dose Route Frequency Provider Last Rate Last Admin   sodium chloride flush (NS) 0.9 % injection 3 mL  3 mL Intravenous Q12H Croitoru, Mihai, MD       Medications Prior to Admission  Medication  Sig Dispense Refill   acetaminophen (TYLENOL) 500 MG tablet Take 500-1,000 mg by mouth 2 (two) times daily as needed (back pain.).     apixaban (ELIQUIS) 5 MG TABS tablet TAKE 1 TABLET(5 MG) BY MOUTH TWICE DAILY (Patient taking differently: Take 5 mg by mouth 2 (two) times daily.) 60 tablet 5   bisoprolol (ZEBETA) 10 MG tablet Take 10 mg every morning and (1/2 tablet) 5 mg every afternoon (Patient taking  differently: Take 5-10 mg by mouth See admin instructions. 10 mg in the morning  5 mg in the evening) 135 tablet 3   Calcium Carbonate-Vitamin D 600-400 MG-UNIT tablet Take 1 tablet by mouth 2 (two) times daily.      cetirizine (ZYRTEC) 10 MG tablet Take 10 mg by mouth every evening.      Coenzyme Q10 (COQ-10) 100 MG CAPS Take 100 mg by mouth daily.      diltiazem (DILACOR XR) 120 MG 24 hr capsule Take 1 capsule (120 mg total) by mouth daily. 30 capsule 11   ferrous sulfate 325 (65 FE) MG EC tablet Take 325 mg by mouth in the morning and at bedtime.     LORazepam (ATIVAN) 1 MG tablet Take 0.5 mg by mouth See admin instructions. Every other night     simvastatin (ZOCOR) 20 MG tablet Take 20 mg by mouth daily.     vitamin C (ASCORBIC ACID) 500 MG tablet Take 500 mg by mouth daily.     bisoprolol (ZEBETA) 5 MG tablet TAKE 1/2 TABLET BY MOUTH DAILY (Patient not taking: No sig reported) 45 tablet 3   Sodium Sulfate-Mag Sulfate-KCl (SUTAB) [phone] MG TABS Use as directed for colonoscopy. MANUFACTURER CODES!! BIN: [id number] PCN: CN GROUP: [id number] MEMBER ID: [id number];HTX AS SECONDARY INSURANCE ;NO PRIOR AUTHORIZATION 24 tablet 0    Prior to Admission medications   Medication Sig Start Date End Date Taking? Authorizing Provider  acetaminophen (TYLENOL) 500 MG tablet Take 500-1,000 mg by mouth 2 (two) times daily as needed (back pain.).   Yes [provider]  apixaban (ELIQUIS) 5 MG TABS tablet TAKE 1 TABLET(5 MG) BY MOUTH TWICE DAILY Patient taking differently: Take 5 mg by mouth 2 (two) times daily. 12/08/20  Yes Croitoru, Mihai, MD  bisoprolol (ZEBETA) 10 MG tablet Take 10 mg every morning and (1/2 tablet) 5 mg every afternoon Patient taking differently: Take 5-10 mg by mouth See admin instructions. 10 mg in the morning  5 mg in the evening 12/01/20  Yes Croitoru, Mihai, MD  Calcium Carbonate-Vitamin D 600-400 MG-UNIT tablet Take 1 tablet by mouth 2 (two) times daily.    Yes [provider]  cetirizine (ZYRTEC) 10 MG tablet Take 10 mg by mouth every evening.    Yes [provider]  Coenzyme Q10 (COQ-10) 100 MG CAPS Take 100 mg by mouth daily.    Yes [provider]  diltiazem (DILACOR XR) 120 MG 24 hr capsule Take 1 capsule (120 mg total) by mouth daily. 10/20/20  Yes Loel Dubonnet, NP  ferrous sulfate 325 (65 FE) MG EC tablet Take 325 mg by mouth in the morning and at bedtime.   Yes [provider]  LORazepam (ATIVAN) 1 MG tablet Take 0.5 mg by mouth See admin instructions. Every other night   Yes [provider]  simvastatin (ZOCOR) 20 MG tablet Take 20 mg by mouth daily.   Yes [provider]  vitamin C (ASCORBIC ACID) 500 MG tablet Take 500 mg by mouth daily.  Yes [provider]  bisoprolol (ZEBETA) 5 MG tablet TAKE 1/2 TABLET BY MOUTH DAILY Patient not taking: No sig reported 12/01/20   Croitoru, Mihai, MD  Sodium Sulfate-Mag Sulfate-KCl (SUTAB) [phone] MG TABS Use as directed for colonoscopy. MANUFACTURER CODES!! BIN: [id number] PCN: CN GROUP: [id number] MEMBER ID: [id number];GLO AS SECONDARY INSURANCE ;NO PRIOR AUTHORIZATION 10/23/20   Pyrtle, Lajuan Lines, MD    Blood pressure 118/61, pulse 79, temperature 98.7 F (37.1 C), temperature source Oral, resp. rate 18, height 5\' 2"  (1.575 m), weight 59.5 kg, SpO2 97 %. Physical Exam: General: pleasant, WD/WN female who is laying in bed in NAD HEENT: head is normocephalic, atraumatic.  Sclera are noninjected.  PERRL.  Ears and nose without any masses or lesions.  Mouth is pink and moist. Dentition fair Heart: regular, rate, and rhythm.  Normal s1,s2. No obvious murmurs, gallops, or rubs noted.  Palpable pedal pulses bilaterally  Lungs: CTAB, no wheezes, rhonchi, or rales noted.  Respiratory effort nonlabored Abd: Soft, NT/ND, +BS, no masses, hernias, or organomegaly MS: no BUE/BLE edema, calves soft and nontender Skin: warm and dry with no masses, lesions, or  rashes Psych: A&Ox4 with an appropriate affect Neuro: cranial nerves grossly intact, equal strength in BUE/BLE bilaterally, normal speech, thought process intact, gait not assessed.  Results for orders placed or performed during the hospital encounter of 12/14/20 (from the past 48 hour(s))  CBC     Status: Abnormal   Collection Time: 12/19/20  2:15 AM  Result Value Ref Range   WBC 5.1 4.0 - 10.5 K/uL   RBC 2.64 (L) 3.87 - 5.11 MIL/uL   Hemoglobin 8.1 (L) 12.0 - 15.0 g/dL   HCT 25.9 (L) 36.0 - 46.0 %   MCV 98.1 80.0 - 100.0 fL   MCH 30.7 26.0 - 34.0 pg   MCHC 31.3 30.0 - 36.0 g/dL   RDW 13.4 11.5 - 15.5 %   Platelets 251 150 - 400 K/uL   nRBC 0.0 0.0 - 0.2 %    Comment: Performed at [facility], [place] [address], [place], [place] [address]  Basic metabolic panel     Status: Abnormal   Collection Time: 12/19/20  2:15 AM  Result Value Ref Range   Sodium 136 135 - 145 mmol/L   Potassium 3.4 (L) 3.5 - 5.1 mmol/L   Chloride 96 (L) 98 - 111 mmol/L   CO2 29 22 - 32 mmol/L   Glucose, Bld 101 (H) 70 - 99 mg/dL    Comment: Glucose reference range applies only to samples taken after fasting for at least 8 hours.   BUN 18 8 - 23 mg/dL   Creatinine, Ser 0.97 0.44 - 1.00 mg/dL   Calcium 9.2 8.9 - 10.3 mg/dL   GFR, Estimated >60 >60 mL/min    Comment: (NOTE) Calculated using the CKD-EPI Creatinine Equation (2021)    Anion gap 11 5 - 15    Comment: Performed at [place] [address], [place], [place] [address]  Magnesium     Status: None   Collection Time: 12/19/20  2:15 AM  Result Value Ref Range   Magnesium 2.3 1.7 - 2.4 mg/dL    Comment: Performed at [place] [address] [place], [place] [address]  Vitamin B12     Status: None   Collection Time: 12/19/20  2:15 AM  Result Value Ref Range   Vitamin B-12 846 180 - 914 pg/mL    Comment: (NOTE) This assay  is not validated for testing neonatal or myeloproliferative syndrome specimens for Vitamin B12  levels. Performed at [facility], [place] [address], [place], [place] [address]   Basic metabolic panel     Status: Abnormal   Collection Time: 12/20/20  2:17 AM  Result Value Ref Range   Sodium 138 135 - 145 mmol/L   Potassium 3.6 3.5 - 5.1 mmol/L   Chloride 97 (L) 98 - 111 mmol/L   CO2 23 22 - 32 mmol/L   Glucose, Bld 110 (H) 70 - 99 mg/dL    Comment: Glucose reference range applies only to samples taken after fasting for at least 8 hours.   BUN 16 8 - 23 mg/dL   Creatinine, Ser 1.29 (H) 0.44 - 1.00 mg/dL   Calcium 10.4 (H) 8.9 - 10.3 mg/dL   GFR, Estimated 45 (L) >60 mL/min    Comment: (NOTE) Calculated using the CKD-EPI Creatinine Equation (2021)    Anion gap 18 (H) 5 - 15    Comment: Performed at [place] [address], [place], [place] [address]  Magnesium     Status: None   Collection Time: 12/20/20  2:17 AM  Result Value Ref Range   Magnesium 2.3 1.7 - 2.4 mg/dL    Comment: Performed at [place] [address], [place], [place] [address]  CBC     Status: Abnormal   Collection Time: 12/20/20  4:49 AM  Result Value Ref Range   WBC 7.1 4.0 - 10.5 K/uL   RBC 3.02 (L) 3.87 - 5.11 MIL/uL   Hemoglobin 9.5 (L) 12.0 - 15.0 g/dL   HCT 29.0 (L) 36.0 - 46.0 %   MCV 96.0 80.0 - 100.0 fL   MCH 31.5 26.0 - 34.0 pg   MCHC 32.8 30.0 - 36.0 g/dL   RDW 13.5 11.5 - 15.5 %   Platelets 313 150 - 400 K/uL   nRBC 0.6 (H) 0.0 - 0.2 %    Comment: Performed at [facility], [place] [address], [place], [place] [address]   No results found.  Anti-infectives (From admission, onward)    Start     Dose/Rate Route Frequency Ordered Stop   12/17/20 1000  levofloxacin (LEVAQUIN) tablet 500 mg  Status:  Discontinued        500 mg Oral Daily 12/17/20 0822 12/18/20 1633   12/15/20 0915  levofloxacin (LEVAQUIN) IVPB 750 mg  Status:  Discontinued        750 mg 100 mL/hr over 90 Minutes Intravenous Every 24 hours 12/15/20 0901 12/16/20 0549         Assessment/Plan Cecal mass - Patient noted to have malignant appearing cecal mass on colonoscopy today. Await pathology to confirm. Needs CT chest, abdomen, pelvis to rule out metastasis. CEA pending. Would recommend right colectomy this admission given bleeding issues. Continue holding eliquis. Given significant heart disease we have asked cardiology to weight in regarding surgical risk stratification/ optimization. She has already had bowel preop for colonoscopy so please do not advance her past a liquid diet.  - I discussed with the patient and her husband the anatomy and physiology of the GI tract. The planned procedure and material risks were discussed with the patient. Risks include but are not limited to anesthesia (MI, CVA, prolonged intubation, aspiration, death), pain, bleeding,  infection, scarring, hernia, damage to surrounding structures (blood vessels/nerves/viscus/organs/ureter), ileus, leak from anastomosis and possible need for stoma/ileostomy. We discussed the possible need to convert to  open. We also discussed typical post-operative care including the possible need for rehab/snf if necessary. The patient's questions were answered to their satisfaction, they voiced understanding and elected to proceed with surgery.  - We will follow.  ID - none currently VTE - SCDs FEN - CLD Foley - none  Hypertrophic obstructive cardiomyopathy Paroxysmal A. fib on Eliquis (Last dose was evening of 9/18) Sick sinus syndrome s/p PPM Nonobstructive CAD  Decompensated heart failure  Elevated troponin/ demand ischemia Neurogenic orthostatic hypotension GERD Acute on chronic anemia  Alferd Apa, [facility] Surgery 12/20/2020, 1:27 PM Please see Amion for pager number during day hours 7:00am-4:30pm

## 2020-12-21 ENCOUNTER — Inpatient Hospital Stay (HOSPITAL_COMMUNITY): Payer: Medicare PPO

## 2020-12-21 ENCOUNTER — Ambulatory Visit (HOSPITAL_COMMUNITY): Admission: RE | Admit: 2020-12-21 | Payer: Medicare PPO | Source: Home / Self Care | Admitting: Internal Medicine

## 2020-12-21 ENCOUNTER — Ambulatory Visit (HOSPITAL_BASED_OUTPATIENT_CLINIC_OR_DEPARTMENT_OTHER): Payer: Medicare PPO | Admitting: Family

## 2020-12-21 ENCOUNTER — Other Ambulatory Visit: Payer: Self-pay

## 2020-12-21 DIAGNOSIS — R195 Other fecal abnormalities: Secondary | ICD-10-CM

## 2020-12-21 DIAGNOSIS — I9589 Other hypotension: Secondary | ICD-10-CM

## 2020-12-21 DIAGNOSIS — I214 Non-ST elevation (NSTEMI) myocardial infarction: Secondary | ICD-10-CM | POA: Diagnosis not present

## 2020-12-21 DIAGNOSIS — I421 Obstructive hypertrophic cardiomyopathy: Secondary | ICD-10-CM | POA: Diagnosis not present

## 2020-12-21 LAB — CBC
HCT: 28.5 % — ABNORMAL LOW (ref 36.0–46.0)
Hemoglobin: 9.2 g/dL — ABNORMAL LOW (ref 12.0–15.0)
MCH: 31.1 pg (ref 26.0–34.0)
MCHC: 32.3 g/dL (ref 30.0–36.0)
MCV: 96.3 fL (ref 80.0–100.0)
Platelets: 292 10*3/uL (ref 150–400)
RBC: 2.96 MIL/uL — ABNORMAL LOW (ref 3.87–5.11)
RDW: 13.4 % (ref 11.5–15.5)
WBC: 4.8 10*3/uL (ref 4.0–10.5)
nRBC: 0.4 % — ABNORMAL HIGH (ref 0.0–0.2)

## 2020-12-21 LAB — BASIC METABOLIC PANEL
Anion gap: 11 (ref 5–15)
BUN: 11 mg/dL (ref 8–23)
CO2: 27 mmol/L (ref 22–32)
Calcium: 10.1 mg/dL (ref 8.9–10.3)
Chloride: 96 mmol/L — ABNORMAL LOW (ref 98–111)
Creatinine, Ser: 0.86 mg/dL (ref 0.44–1.00)
GFR, Estimated: >60 mL/min (ref >60)
Glucose, Bld: 84 mg/dL (ref 70–99)
Potassium: 3.4 mmol/L — ABNORMAL LOW (ref 3.5–5.1)
Sodium: 134 mmol/L — ABNORMAL LOW (ref 135–145)

## 2020-12-21 LAB — MAGNESIUM: Magnesium: 2.2 mg/dL (ref 1.7–2.4)

## 2020-12-21 SURGERY — COLONOSCOPY WITH PROPOFOL
Anesthesia: Monitor Anesthesia Care

## 2020-12-21 MED ORDER — PNEUMOCOCCAL VAC POLYVALENT 25 MCG/0.5ML IJ INJ
0.5000 mL | INJECTION | INTRAMUSCULAR | Status: AC
  Administered 2020-12-25: 0.5 mL via INTRAMUSCULAR
  Filled 2020-12-21: qty 0.5

## 2020-12-21 MED ORDER — POTASSIUM CHLORIDE CRYS ER 20 MEQ PO TBCR: 40.0000 meq | EXTENDED_RELEASE_TABLET | Freq: Once | ORAL | Status: DC

## 2020-12-21 MED ORDER — METOPROLOL TARTRATE 50 MG PO TABS
50.0000 mg | ORAL_TABLET | Freq: Two times a day (BID) | ORAL | Status: DC
  Administered 2020-12-21 – 2020-12-25 (×7): 50 mg via ORAL
  Filled 2020-12-21 (×8): qty 1

## 2020-12-21 MED ORDER — INFLUENZA VAC A&B SA ADJ QUAD 0.5 ML IM PRSY
0.5000 mL | PREFILLED_SYRINGE | INTRAMUSCULAR | Status: AC
  Administered 2020-12-25: 0.5 mL via INTRAMUSCULAR
  Filled 2020-12-21: qty 0.5

## 2020-12-21 MED ORDER — POTASSIUM CHLORIDE CRYS ER 20 MEQ PO TBCR
20.0000 meq | EXTENDED_RELEASE_TABLET | Freq: Two times a day (BID) | ORAL | Status: DC
  Administered 2020-12-21 – 2020-12-25 (×8): 20 meq via ORAL
  Filled 2020-12-21 (×8): qty 1

## 2020-12-21 MED ORDER — METRONIDAZOLE 500 MG/100ML IV SOLN
500.0000 mg | INTRAVENOUS | Status: DC
Start: 2020-12-21 — End: 2020-12-22

## 2020-12-21 MED ORDER — CIPROFLOXACIN IN D5W 400 MG/200ML IV SOLN: 400.0000 mg | INTRAVENOUS | Status: DC

## 2020-12-21 MED ORDER — IOHEXOL 9 MG/ML PO SOLN
ORAL | Status: AC
  Administered 2020-12-21: 500 mL
  Filled 2020-12-21: qty 1000

## 2020-12-21 MED ORDER — HEPARIN SODIUM (PORCINE) 5000 UNIT/ML IJ SOLN: 5000.0000 [IU] | Freq: Three times a day (TID) | INTRAMUSCULAR | Status: DC

## 2020-12-21 NOTE — Progress Notes (Signed)
    1 Day Post-Op  Subjective: CC: Doing well. No abdominal pain, n/v. Tolerating cld. No BM. Cleared by cards.   Objective: Vital signs in last 24 hours: Temp:  [97.9 F (36.6 C)-99 F (37.2 C)] 97.9 F (36.6 C) (09/22 0503) Pulse Rate:  [71-85] 85 (09/22 0900) Resp:  [18-20] 18 (09/22 0503) BP: (97-150)/(61-81) 150/81 (09/22 0900) SpO2:  [96 %-99 %] 99 % (09/22 0503) Weight:  [59.6 kg] 59.6 kg (09/22 0503) Last BM Date: 12/20/20  Intake/Output from previous day: 09/21 0701 - 09/22 0700 In: 650 [P.O.:600; I.V.:50] Out: 1325 [Urine:1325] Intake/Output this shift: Total I/O In: 3 [I.V.:3] Out: -   PE: Gen:  Alert, NAD, pleasant Card:  Reg. Pacemaker left upper chest Pulm:  CTAB, no W/R/R, effort normal Abd: Soft, ND, NT +BS Psych: A&Ox3  Skin: no rashes noted, warm and dry  Lab Results:  Recent Labs    12/20/20 0449 12/21/20 0201  WBC 7.1 4.8  HGB 9.5* 9.2*  HCT 29.0* 28.5*  PLT 313 292   BMET Recent Labs    12/20/20 0217 12/21/20 0201  NA 138 134*  K 3.6 3.4*  CL 97* 96*  CO2 23 27  GLUCOSE 110* 84  BUN 16 11  CREATININE 1.29* 0.86  CALCIUM 10.4* 10.1   PT/INR No results for input(s): LABPROT, INR in the last 72 hours. CMP     Component Value Date/Time   NA 134 (L) 12/21/2020 0201   NA 142 02/16/2020 1056   K 3.4 (L) 12/21/2020 0201   CL 96 (L) 12/21/2020 0201   CO2 27 12/21/2020 0201   GLUCOSE 84 12/21/2020 0201   BUN 11 12/21/2020 0201   BUN 9 02/16/2020 1056   CREATININE 0.86 12/21/2020 0201   CALCIUM 10.1 12/21/2020 0201   GFRNONAA >60 12/21/2020 0201   GFRAA 88 02/16/2020 1056   Lipase  No results found for: LIPASE  Studies/Results: No results found.  Anti-infectives: Anti-infectives (From admission, onward)    Start     Dose/Rate Route Frequency Ordered Stop   12/17/20 1000  levofloxacin (LEVAQUIN) tablet 500 mg  Status:  Discontinued        500 mg Oral Daily 12/17/20 0822 12/18/20 1633   12/15/20 0915  levofloxacin  (LEVAQUIN) IVPB 750 mg  Status:  Discontinued        750 mg 100 mL/hr over 90 Minutes Intravenous Every 24 hours 12/15/20 0901 12/16/20 0549        Assessment/Plan Cecal mass - Patient noted to have malignant appearing cecal mass on colonoscopy 9/21.  - Pathology pending.  - CEA pending - CT chest without mets - CT A/P ordered for today to r/o mets - Cleared by cards and they feel she is optimized. Will plan for Right Colectomy tomorrow given issues w/ bleeding.  - She had bowel prep before colonoscopy. Cont CLD today, NPO at midnight. Discussed with MD, will give Entereg pre-op.    ID - none currently. Peri-op abx VTE - SCDs, continue to hold home eliquis  FEN - CLD. NPO midnight Foley - none   Hypertrophic obstructive cardiomyopathy Paroxysmal A. fib on Eliquis (Last dose was evening of 9/18) Sick sinus syndrome s/p PPM Nonobstructive CAD  Decompensated heart failure  Elevated troponin/ demand ischemia Neurogenic orthostatic hypotension GERD Acute on chronic anemia   LOS: 6 days    Jillyn Ledger , [facility] Surgery 12/21/2020, 9:27 AM Please see Amion for pager number during day hours 7:00am-4:30pm

## 2020-12-21 NOTE — Progress Notes (Signed)
Physical Therapy Treatment Patient Details Name: Alexandria French MRN: [id number] DOB: September 21, 1950 Today's Date: 12/21/2020   History of Present Illness Pt is a 70 y.o. female who presented [facility] the ED at [facility] on 12/14/20 with new-onset palpitations, SOB, and dizziness. Pt found to be hypotensive and admitted with acute hypoxic respiratory failure secondary to decompensated diastolic heart failure. S/p R/L heart cath and coronary angiography 9/16. PMH: hypertrophic obstructive cardiomyopathy, paroxysmal atrial fibrillation on Eliquis, sick sinus syndrome status post pacemaker, hypertension, nonobstructive CAD, diverticulosis    PT Comments    Pt sitting on EoB on entry, agreeable to therapy but requests not going in hallway today. Pt expresses concern about CA diagnosis and surgery tomorrow. Pt reports she gave her husband the day off as it has been hard on both of them. Discussed benefit of getting up and walking in room throughout the day to keep strength and change focus for a little while. Pt is independent in transfers and ambulation without AD. PT will follow back for Re-Eval after surgery.     Recommendations for follow up therapy are one component of a multi-disciplinary discharge planning process, led by the attending physician.  Recommendations may be updated based on patient status, additional functional criteria and insurance authorization.  Follow Up Recommendations  Supervision for mobility/OOB;Home health PT     Equipment Recommendations  None recommended by PT       Precautions / Restrictions Precautions Precautions: Fall Precaution Comments: monitor BP/vitals Restrictions Weight Bearing Restrictions: No     Mobility  Bed Mobility               General bed mobility comments: sitting EoB on entry    Transfers Overall transfer level: Independent Equipment used: None             General transfer comment: Pt able to transfer to stand multiple times without  LOB.  Ambulation/Gait Ambulation/Gait assistance: Independent Gait Distance (Feet): 60 Feet Assistive device: None Gait Pattern/deviations: Step-through pattern;Decreased stride length;Narrow base of support;WFL(Within Functional Limits) Gait velocity: reduced Gait velocity interpretation: <1.8 ft/sec, indicate of risk for recurrent falls General Gait Details: slow, steady gait          Balance Overall balance assessment: Mild deficits observed, not formally tested                                          Cognition Arousal/Alertness: Awake/alert Behavior During Therapy: WFL for tasks assessed/performed Overall Cognitive Status: Within Functional Limits for tasks assessed                                 General Comments: obviously worried about CA diagnosis and surgery tomorrow         General Comments General comments (skin integrity, edema, etc.): VSS on RA, discussed surgery tomorrow,      Pertinent Vitals/Pain Pain Assessment: No/denies pain     PT Goals (current goals can now be found in the care plan section) Acute Rehab PT Goals Patient Stated Goal: to get better PT Goal Formulation: With patient Time For Goal Achievement: 12/24/20 Potential to Achieve Goals: Good Progress towards PT goals: Progressing toward goals    Frequency    Min 3X/week      PT Plan Discharge plan needs to be updated       AM-PAC  PT "6 Clicks" Mobility   Outcome Measure  Help needed turning from your back to your side while in a flat bed without using bedrails?: None Help needed moving from lying on your back to sitting on the side of a flat bed without using bedrails?: None Help needed moving to and from a bed to a chair (including a wheelchair)?: None Help needed standing up from a chair using your arms (e.g., wheelchair or bedside chair)?: None Help needed to walk in hospital room?: None Help needed climbing 3-5 steps with a railing? : A  Little 6 Click Score: 23    End of Session Equipment Utilized During Treatment: Gait belt Activity Tolerance: Patient tolerated treatment well Patient left: with call bell/phone within reach;in bed Nurse Communication: Mobility status PT Visit Diagnosis: Unsteadiness on feet (R26.81);Other abnormalities of gait and mobility (R26.89);Difficulty in walking, not elsewhere classified (R26.2);Dizziness and giddiness (R42)     Time: 8937-3428 PT Time Calculation (min) (ACUTE ONLY): 14 min  Charges:  $Therapeutic Exercise: 8-22 mins                     Sina Lucchesi B. Migdalia Dk PT, DPT Acute Rehabilitation Services Pager [phone] Office [phone]    [place] 12/21/2020, 11:30 AM

## 2020-12-21 NOTE — Progress Notes (Addendum)
Cardiology Progress Note  [name] MRN: [id number] DOB: Nov 09, 1950 Date of Encounter: 12/21/2020  Primary Cardiologist: Sanda Klein, MD  Subjective   Chief Complaint: none.   HPI: Breathing much improved.  Had 11 seconds of nonsustained VT last night.  Did have symptoms.  Symptoms resolved quickly.  No further episodes.  Plans for possible surgical resection of colonic mass tomorrow.  Euvolemic on exam.  ROS:  All other ROS reviewed and negative. Pertinent positives noted in the HPI.     Inpatient Medications  Scheduled Meds:  vitamin C  500 mg Oral Daily   calcium-vitamin D  1 tablet Oral BID   hydrocortisone  25 mg Rectal BID   [START ON 12/22/2020] influenza vaccine adjuvanted  0.5 mL Intramuscular Tomorrow-1000   loratadine  10 mg Oral Daily   LORazepam  0.5 mg Oral Q48H   melatonin  3 mg Oral QHS   metoprolol tartrate  25 mg Oral BID   midodrine  5 mg Oral TID WC   [START ON 12/22/2020] pneumococcal 23 valent vaccine  0.5 mL Intramuscular Tomorrow-1000   simvastatin  20 mg Oral Daily   sodium chloride flush  3 mL Intravenous Q12H   Continuous Infusions:  PRN Meds: acetaminophen, ondansetron (ZOFRAN) IV, sodium chloride flush, zolpidem   Vital Signs   Vitals:   12/20/20 1437 12/20/20 1719 12/20/20 2002 12/21/20 0503  BP: 108/65 134/75 112/66 112/79  Pulse:  83 78 71  Resp:   18 18  Temp: 98.1 F (36.7 C)  99 F (37.2 C) 97.9 F (36.6 C)  TempSrc: Oral  Oral Oral  SpO2:    99%  Weight:    59.6 kg  Height:        Intake/Output Summary (Last 24 hours) at 12/21/2020 0850 Last data filed at 12/21/2020 0506 Gross per 24 hour  Intake 650 ml  Output 1125 ml  Net -475 ml   Last 3 Weights 12/21/2020 12/20/2020 12/19/2020  Weight (lbs) 131 lb 6.3 oz 131 lb 1.6 oz 135 lb 3.2 oz  Weight (kg) 59.6 kg 59.467 kg 61.326 kg      Telemetry  Overnight telemetry shows sinus rhythm in the 70s, which I personally reviewed.   Physical Exam   Vitals:    12/20/20 1437 12/20/20 1719 12/20/20 2002 12/21/20 0503  BP: 108/65 134/75 112/66 112/79  Pulse:  83 78 71  Resp:   18 18  Temp: 98.1 F (36.7 C)  99 F (37.2 C) 97.9 F (36.6 C)  TempSrc: Oral  Oral Oral  SpO2:    99%  Weight:    59.6 kg  Height:        Intake/Output Summary (Last 24 hours) at 12/21/2020 0850 Last data filed at 12/21/2020 0506 Gross per 24 hour  Intake 650 ml  Output 1125 ml  Net -475 ml    Last 3 Weights 12/21/2020 12/20/2020 12/19/2020  Weight (lbs) 131 lb 6.3 oz 131 lb 1.6 oz 135 lb 3.2 oz  Weight (kg) 59.6 kg 59.467 kg 61.326 kg    Body mass index is 24.03 kg/m.   General: Well nourished, well developed, in no acute distress Head: Atraumatic, normal size  Eyes: PEERLA, EOMI  Neck: Supple, no JVD Endocrine: No thryomegaly Cardiac: Normal S1, S2; RRR; 3-6 systolic ejection murmur Lungs: Clear to auscultation bilaterally, no wheezing, rhonchi or rales  Abd: Soft, nontender, no hepatomegaly  Ext: No edema, pulses 2+ Musculoskeletal: No deformities, BUE and BLE strength normal and equal Skin:  Warm and dry, no rashes   Neuro: Alert and oriented to person, place, time, and situation, CNII-XII grossly intact, no focal deficits  Psych: Normal mood and affect   Labs  High Sensitivity Troponin:   Recent Labs  Lab 12/14/20 2130 12/14/20 2345 12/15/20 0618 12/15/20 0817  TROPONINIHS 593* 502* 601* 652*     Cardiac EnzymesNo results for input(s): TROPONINI in the last 168 hours. No results for input(s): TROPIPOC in the last 168 hours.  Chemistry Recent Labs  Lab 12/19/20 0215 12/20/20 0217 12/21/20 0201  NA 136 138 134*  K 3.4* 3.6 3.4*  CL 96* 97* 96*  CO2 29 23 27   GLUCOSE 101* 110* 84  BUN 18 16 11   CREATININE 0.97 1.29* 0.86  CALCIUM 9.2 10.4* 10.1  GFRNONAA >60 45* >60  ANIONGAP 11 18* 11    Hematology Recent Labs  Lab 12/19/20 0215 12/20/20 0449 12/21/20 0201  WBC 5.1 7.1 4.8  RBC 2.64* 3.02* 2.96*  HGB 8.1* 9.5* 9.2*  HCT 25.9*  29.0* 28.5*  MCV 98.1 96.0 96.3  MCH 30.7 31.5 31.1  MCHC 31.3 32.8 32.3  RDW 13.4 13.5 13.4  PLT 251 313 292   BNP Recent Labs  Lab 12/15/20 0817  BNP 1,765.0*    DDimer No results for input(s): DDIMER in the last 168 hours.   Radiology  No results found.  Cardiac Studies  LHC/RHC 12/15/2020 SUMMARY Angiographically Normal Coronary arteries with a left dominant system. Significant LVOT gradient with apical LV pressures of 184/12 mmHg with an EDP of 25, mid LV cavity 157/9 mmHg with EDP of 25 and outflow tract pressure of 111/9 mmHg with a stable EDP of 24 mmHg. Mild Secondary Pulmonary Hypertension with mean PAP of 29 mmHg and PCWP of 29 mmHg. Moderately reduced cardiac function with a cardiac output-index of 3.26 and 2.02 by Fick and 3.55-2.2 by thermodilution.  TTE 12/15/2020  1. Left ventricular ejection fraction, by estimation, is 55-60%. The left  ventricle demonstrates regional wall motion abnormalities that are  consistent with a left bundle branch block.There is severe asymmetric left  ventricular hypertrophy. Left  ventricular diastolic parameters are consistent with Grade I diastolic  dysfunction (impaired relaxation). No significant LVOT Obstructive seen in  this study.   2. Right ventricular systolic function is normal. The right ventricular  size is normal. There is normal pulmonary artery systolic pressure.   3. The mitral valve is normal in structure. Mild mitral valve  regurgitation. No evidence of mitral stenosis.   4. The aortic valve was not well visualized. Aortic valve regurgitation  is trivial. No aortic stenosis is present.   5. The inferior vena cava is normal in size with <50% respiratory  variability, suggesting right atrial pressure of 8 mmHg.   6. Technically difficult acquisition; in future studies consider use of  echo-contrast.   Patient Profile  Alexandria French is a 70 y.o. female with hypertrophic obstructive cardiomyopathy, paroxysmal  atrial fibrillation on Eliquis, sick sinus syndrome status post pacemaker, hypertension, nonobstructive CAD who was admitted on 12/15/2020 with dizziness acute hypoxic respiratory failure secondary to decompensated diastolic heart failure.  Assessment & Plan   #Preoperative assessment -No evidence of CAD.  She is euvolemic.  She is hemodynamically stable. -She does have hypertrophic cardiomyopathy but is well compensated. -Okay to proceed to surgery from my standpoint.  She is optimized. -Anesthesia should be aware that phenylephrine would be the pressor of choice during her procedure.  Would avoid inotropes or agents that increase  contractility.  Cardiology will be available if needed.  #Acute HFpEF -Admitted with dizziness and fatigue.  Given fluids in the ER.  She was volume overloaded.  Has been diuresed. -No further diuresis. -Volume status much improved.  #Neurogenic orthostatic hypotension -Continue midodrine 5 mg TID -compression stockings -suspect this is the source of her hypotension. She has continued to improve with this.   #Iron deficiency anemia #Colon cancer -Suspect this is contributing to her symptoms. -Now with colon cancer. -Continue to hold Eliquis. -GI with plans for surgery likely tomorrow after CT scan today.  #Paroxysmal A. Fib -Maintaining sinus rhythm. -Holding Eliquis. -SCDs for DVT ppx.  -Continue beta-blocker. -Restart Eliquis after surgery.  No need for bridging.  #AKI -Secondary to overdiuresis.  Resolved.  #Non-STEMI, ruled out -Elevated troponin was related to hypertrophic cardiomyopathy.  She has ruled out for non-STEMI.  For questions or updates, please contact [place] Please consult [email or web address] for contact info under   Time Spent with Patient: I have spent a total of 35 minutes with patient reviewing hospital notes, telemetry, EKGs, labs and examining the patient as well as establishing an assessment and plan that was discussed  with the patient.  > 50% of time was spent in direct patient care.    Signed, Addison Naegeli. Audie Box, MD, [place]  12/21/2020 8:50 AM

## 2020-12-21 NOTE — Progress Notes (Signed)
[facility] PROGRESS NOTE    Progress Note  Alexandria French  MIW:[id number] DOB: [birth year] DOA: 12/14/2020 PCP: [place] Nation, MD     Brief Narrative:   Alexandria French is an 70 y.o. female past medical history significant for hypertrophic obstructive cardiomyopathy, paroxysmal atrial fibrillation on Eliquis, sick sinus syndrome with a pacemaker presents with increased shortness of breath and a near syncopal episode   Significant studies: Cardiac cath 12/19/2020 showed nonobstructive disease moderate reduced cardiac output.  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 Colonoscopy showed a cecal mass biopsies are pending. CT scan of the abdomen pelvis with contrast has been ordered. General surgery has been consulted plan, CT scan of the chest does not show metastatic disease. Surgery recommended surgical intervention 12/22/2020. Continue to hold Eliquis for surgical procedure.  Acute on chronic diastolic heart failure Secondary to [facility]. She was diuresed and her creatinine has remained on the rise appears euvolemic on physical exam. Has improved to baseline with holding of diuretic therapy. Continue on metoprolol. Cardiac cath showed moderately reduced cardiac output to start on low-dose midodrine. Cardiology relate that his symptoms are probably due to his anemia. She is currently 59 kg.  Neurogenic orthostatic hypotension : Have improved with midodrine. Continue compression stockings.  Elevated troponins: Cardiac cath showed nonobstructive disease.  Mild hypokalemia: Replete orally recheck tomorrow morning.  Paroxysmal atrial fibrillation: Holding Eliquis now in  sinus rhythm.  Continue metoprolol.  Injury: Likely due to overdiuresis diuretics were held creatinine has returned to baseline.   DVT prophylaxis: l SCDs Family Communication:none Status is: Inpatient  Remains inpatient appropriate because:Hemodynamically unstable  Dispo: The patient is from: Home              Anticipated d/c is to: Home              Patient currently is not medically stable to d/c.   Difficult to place patient No     Code Status:     Code Status Orders  (From admission, onward)           Start     Ordered   12/15/20 0414  Full code  Continuous        12/15/20 0415           Code Status History     Date Active Date Inactive Code Status Order ID Comments User Context   02/28/2020 1519 02/28/2020 2157 Full Code [id number]  Croitoru, Dani Gobble, MD Inpatient      Advance Directive Documentation    Flowsheet Row Most Recent Value  Type of Advance Directive Healthcare Power of Attorney, Living will  Pre-existing out of facility DNR order (yellow form or pink MOST form) --  "MOST" Form in Place? --         IV Access:   Peripheral IV   Procedures and diagnostic studies:   No results found.   Medical Consultants:   None.   Subjective:    [name] no complaints today  Objective:    Vitals:   12/20/20 1437 12/20/20 1719 12/20/20 2002 12/21/20 0503  BP: 108/65 134/75 112/66 112/79  Pulse:  83 78 71  Resp:   18 18  Temp: 98.1 F (36.7 C)  99 F (37.2  C) 97.9 F (36.6 C)  TempSrc: Oral  Oral Oral  SpO2:    99%  Weight:    59.6 kg  Height:       SpO2: 99 % O2 Flow Rate (L/min): 1 L/min   Intake/Output Summary (Last 24 hours) at 12/21/2020 0834 Last data filed at 12/21/2020 0506 Gross per 24 hour  Intake 650 ml  Output 1125 ml  Net -475 ml   Filed Weights   12/19/20 0458 12/20/20 0656 12/21/20 0503  Weight: 61.3 kg 59.5 kg 59.6 kg    Exam: General exam: In no acute distress. Respiratory system: Good air  movement and clear to auscultation. Cardiovascular system: S1 & S2 heard, RRR. No JVD. Gastrointestinal system: Abdomen is nondistended, soft and nontender.  Extremities: No pedal edema. Skin: No rashes, lesions or ulcers Psychiatry: Judgement and insight appear normal. Mood & affect appropriate.    Data Reviewed:    Labs: Basic Metabolic Panel: Recent Labs  Lab 12/17/20 0338 12/18/20 0348 12/19/20 0215 12/20/20 0217 12/21/20 0201  NA 138 135 136 138 134*  K 4.2 3.6 3.4* 3.6 3.4*  CL 104 97* 96* 97* 96*  CO2 28 28 29 23 27   GLUCOSE 112* 99 101* 110* 84  BUN 11 15 18 16 11   CREATININE 0.93 0.99 0.97 1.29* 0.86  CALCIUM 8.9 8.5* 9.2 10.4* 10.1  MG 2.0 2.4 2.3 2.3 2.2   GFR Estimated Creatinine Clearance: 50.2 mL/min (by C-G formula based on SCr of 0.86 mg/dL). Liver Function Tests: No results for input(s): AST, ALT, ALKPHOS, BILITOT, PROT, ALBUMIN in the last 168 hours. No results for input(s): LIPASE, AMYLASE in the last 168 hours. No results for input(s): AMMONIA in the last 168 hours. Coagulation profile Recent Labs  Lab 12/14/20 2130  INR 1.1   COVID-19 Labs  Recent Labs    12/18/20 0931  FERRITIN 34    Lab Results  Component Value Date   SARSCOV2NAA NEGATIVE 12/14/2020   [place] NEGATIVE 02/25/2020    CBC: Recent Labs  Lab 12/15/20 1601 12/16/20 0127 12/17/20 0338 12/18/20 0348 12/19/20 0215 12/20/20 0449 12/21/20 0201  WBC 12.2*   < > 10.0 6.1 5.1 7.1 4.8  NEUTROABS 11.2*  --   --   --   --   --   --   HGB 10.3*   < > 8.5* 7.8* 8.1* 9.5* 9.2*  HCT 32.0*   < > 27.3* 24.3* 25.9* 29.0* 28.5*  MCV 98.8   < > 99.3 98.4 98.1 96.0 96.3  PLT 288   < > 220 209 251 313 292   < > = values in this interval not displayed.   Cardiac Enzymes: No results for input(s): CKTOTAL, CKMB, CKMBINDEX, TROPONINI in the last 168 hours. BNP (last 3 results) No results for input(s): PROBNP in the last 8760 hours. CBG: Recent Labs  Lab 12/15/20 1400   GLUCAP 114*   D-Dimer: No results for input(s): DDIMER in the last 72 hours. Hgb A1c: No results for input(s): HGBA1C in the last 72 hours. Lipid Profile: No results for input(s): CHOL, HDL, LDLCALC, TRIG, CHOLHDL, LDLDIRECT in the last 72 hours. Thyroid function studies: No results for input(s): TSH, T4TOTAL, T3FREE, THYROIDAB in the last 72 hours.  Invalid input(s): FREET3 Anemia work up: Recent Labs    12/18/20 0931 12/19/20 0215  VITAMINB12  --  846  FERRITIN 34  --   TIBC 350  --   IRON 22*  --    Sepsis Labs: Recent  Labs  Lab 12/15/20 0817 12/15/20 0952 12/15/20 1601 12/16/20 0127 12/17/20 0338 12/18/20 0348 12/19/20 0215 12/20/20 0449 12/21/20 0201  PROCALCITON <0.10  --   --  <0.10 <0.10  --   --   --   --   WBC 8.8 9.6   < > 12.8* 10.0 6.1 5.1 7.1 4.8  LATICACIDVEN 1.7 1.8  --   --   --   --   --   --   --    < > = values in this interval not displayed.   Microbiology Recent Results (from the past 240 hour(s))  Resp Panel by RT-PCR (Flu A&B, Covid) Nasopharyngeal Swab     Status: None   Collection Time: 12/14/20 11:45 PM   Specimen: Nasopharyngeal Swab; Nasopharyngeal(NP) swabs in vial transport medium  Result Value Ref Range Status   SARS Coronavirus 2 by RT PCR NEGATIVE NEGATIVE Final    Comment: (NOTE) SARS-CoV-2 target nucleic acids are NOT DETECTED.  The SARS-CoV-2 RNA is generally detectable in upper respiratory specimens during the acute phase of infection. The lowest concentration of SARS-CoV-2 viral copies this assay can detect is 138 copies/mL. A negative result does not preclude SARS-Cov-2 infection and should not be used as the sole basis for treatment or other patient management decisions. A negative result may occur with  improper specimen collection/handling, submission of specimen other than nasopharyngeal swab, presence of viral mutation(s) within the areas targeted by this assay, and inadequate number of viral copies(<138  copies/mL). A negative result must be combined with clinical observations, patient history, and epidemiological information. The expected result is Negative.  Fact Sheet for Patients:  EntrepreneurPulse.com.au  Fact Sheet for Healthcare Providers:  IncredibleEmployment.be  This test is no t yet approved or cleared by the [place] FDA and  has been authorized for detection and/or diagnosis of SARS-CoV-2 by FDA under an Emergency Use Authorization (EUA). This EUA will remain  in effect (meaning this test can be used) for the duration of the COVID-19 declaration under Section 564(b)(1) of the Act, 21 U.S.C.section 360bbb-3(b)(1), unless the authorization is terminated  or revoked sooner.       Influenza A by PCR NEGATIVE NEGATIVE Final   Influenza B by PCR NEGATIVE NEGATIVE Final    Comment: (NOTE) The Xpert Xpress SARS-CoV-2/FLU/RSV plus assay is intended as an aid in the diagnosis of influenza from Nasopharyngeal swab specimens and should not be used as a sole basis for treatment. Nasal washings and aspirates are unacceptable for Xpert Xpress SARS-CoV-2/FLU/RSV testing.  Fact Sheet for Patients: EntrepreneurPulse.com.au  Fact Sheet for Healthcare Providers: IncredibleEmployment.be  This test is not yet approved or cleared by the [place] FDA and has been authorized for detection and/or diagnosis of SARS-CoV-2 by FDA under an Emergency Use Authorization (EUA). This EUA will remain in effect (meaning this test can be used) for the duration of the COVID-19 declaration under Section 564(b)(1) of the Act, 21 U.S.C. section 360bbb-3(b)(1), unless the authorization is terminated or revoked.  Performed at [facility], [address], [place], [place] [address]   Culture, blood (routine x 2)     Status: None   Collection Time: 12/15/20  9:52 AM   Specimen: BLOOD  Result Value Ref Range Status    Specimen Description BLOOD RIGHT ANTECUBITAL  Final   Special Requests   Final    BOTTLES DRAWN AEROBIC AND ANAEROBIC Blood Culture adequate volume   Culture   Final    NO GROWTH 5 DAYS Performed  at [facility], [place] [address], [place], [place] [address]    Report Status 12/20/2020 FINAL  Final  Culture, blood (routine x 2)     Status: None   Collection Time: 12/15/20 10:03 AM   Specimen: BLOOD LEFT FOREARM  Result Value Ref Range Status   Specimen Description BLOOD LEFT FOREARM  Final   Special Requests   Final    BOTTLES DRAWN AEROBIC AND ANAEROBIC Blood Culture adequate volume   Culture   Final    NO GROWTH 5 DAYS Performed at [facility], [place] [address] [place], [place] [address]    Report Status 12/20/2020 FINAL  Final  MRSA Next Gen by PCR, Nasal     Status: None   Collection Time: 12/15/20  3:00 PM   Specimen: Nasal Mucosa; Nasal Swab  Result Value Ref Range Status   MRSA by PCR Next Gen NOT DETECTED NOT DETECTED Final    Comment: (NOTE) The GeneXpert MRSA Assay (FDA approved for NASAL specimens only), is one component of a comprehensive MRSA colonization surveillance program. It is not intended to diagnose MRSA infection nor to guide or monitor treatment for MRSA infections. Test performance is not FDA approved in patients less than 88 years old. Performed at [facility], [place] [address], [place], [place] [address]      Medications:    vitamin C  500 mg Oral Daily   calcium-vitamin D  1 tablet Oral BID   hydrocortisone  25 mg Rectal BID   [START ON 12/22/2020] influenza vaccine adjuvanted  0.5 mL Intramuscular Tomorrow-1000   loratadine  10 mg Oral Daily   LORazepam  0.5 mg Oral Q48H   melatonin  3 mg Oral QHS   metoprolol tartrate  25 mg Oral BID   midodrine  5 mg Oral TID WC   [START ON 12/22/2020] pneumococcal 23 valent vaccine  0.5 mL Intramuscular Tomorrow-1000   simvastatin  20 mg Oral Daily   sodium chloride flush  3 mL Intravenous  Q12H   Continuous Infusions:    LOS: 6 days   Charlynne Cousins  [facility]  12/21/2020, 8:34 AM

## 2020-12-22 ENCOUNTER — Inpatient Hospital Stay (HOSPITAL_COMMUNITY): Payer: Medicare PPO

## 2020-12-22 ENCOUNTER — Inpatient Hospital Stay (HOSPITAL_COMMUNITY): Payer: Medicare PPO | Admitting: Anesthesiology

## 2020-12-22 ENCOUNTER — Encounter (HOSPITAL_COMMUNITY): Payer: Self-pay | Admitting: Internal Medicine

## 2020-12-22 ENCOUNTER — Encounter (HOSPITAL_COMMUNITY)
Admission: EM | Disposition: A | Discharge status: Home Health | Payer: Self-pay | Source: Home / Self Care | Attending: Cardiovascular Disease

## 2020-12-22 DIAGNOSIS — I214 Non-ST elevation (NSTEMI) myocardial infarction: Secondary | ICD-10-CM | POA: Diagnosis not present

## 2020-12-22 HISTORY — PX: LAPAROSCOPIC PARTIAL COLECTOMY: SHX5907

## 2020-12-22 LAB — SURGICAL PCR SCREEN
MRSA, PCR: NEGATIVE
Staphylococcus aureus: NEGATIVE

## 2020-12-22 LAB — CEA: CEA: 6.1 ng/mL — ABNORMAL HIGH (ref 0.0–4.7)

## 2020-12-22 LAB — BASIC METABOLIC PANEL
Anion gap: 8 (ref 5–15)
BUN: 7 mg/dL — ABNORMAL LOW (ref 8–23)
CO2: 27 mmol/L (ref 22–32)
Calcium: 9.6 mg/dL (ref 8.9–10.3)
Chloride: 97 mmol/L — ABNORMAL LOW (ref 98–111)
Creatinine, Ser: 0.92 mg/dL (ref 0.44–1.00)
GFR, Estimated: >60 mL/min (ref >60)
Glucose, Bld: 87 mg/dL (ref 70–99)
Potassium: 3.4 mmol/L — ABNORMAL LOW (ref 3.5–5.1)
Sodium: 132 mmol/L — ABNORMAL LOW (ref 135–145)

## 2020-12-22 LAB — CBC
HCT: 25 % — ABNORMAL LOW (ref 36.0–46.0)
Hemoglobin: 8.2 g/dL — ABNORMAL LOW (ref 12.0–15.0)
MCH: 31.4 pg (ref 26.0–34.0)
MCHC: 32.8 g/dL (ref 30.0–36.0)
MCV: 95.8 fL (ref 80.0–100.0)
Platelets: 256 10*3/uL (ref 150–400)
RBC: 2.61 MIL/uL — ABNORMAL LOW (ref 3.87–5.11)
RDW: 13.4 % (ref 11.5–15.5)
WBC: 5.3 10*3/uL (ref 4.0–10.5)
nRBC: 0.6 % — ABNORMAL HIGH (ref 0.0–0.2)

## 2020-12-22 LAB — SURGICAL PATHOLOGY

## 2020-12-22 LAB — MAGNESIUM: Magnesium: 2 mg/dL (ref 1.7–2.4)

## 2020-12-22 SURGERY — LAPAROSCOPIC PARTIAL COLECTOMY
Anesthesia: General

## 2020-12-22 MED ORDER — CHLORHEXIDINE GLUCONATE 0.12 % MT SOLN: 15.0000 mL | Freq: Once | OROMUCOSAL | Status: AC

## 2020-12-22 MED ORDER — HYDROMORPHONE HCL 1 MG/ML IJ SOLN
INTRAMUSCULAR | Status: AC
  Filled 2020-12-22: qty 1

## 2020-12-22 MED ORDER — ONDANSETRON HCL 4 MG/2ML IJ SOLN: 4.0000 mg | Freq: Once | INTRAMUSCULAR | Status: DC | PRN

## 2020-12-22 MED ORDER — MIDAZOLAM HCL 2 MG/2ML IJ SOLN
INTRAMUSCULAR | Status: AC
  Filled 2020-12-22: qty 2

## 2020-12-22 MED ORDER — LIDOCAINE HCL (PF) 2 % IJ SOLN
INTRAMUSCULAR | Status: AC
  Filled 2020-12-22: qty 5

## 2020-12-22 MED ORDER — LACTATED RINGERS IV SOLN: INTRAVENOUS | Status: DC | PRN

## 2020-12-22 MED ORDER — MORPHINE SULFATE (PF) 2 MG/ML IV SOLN
2.0000 mg | INTRAVENOUS | Status: DC | PRN
Start: 2020-12-22 — End: 2020-12-25
  Administered 2020-12-22 (×2): 2 mg via INTRAVENOUS
  Filled 2020-12-22 (×2): qty 1

## 2020-12-22 MED ORDER — ALVIMOPAN 12 MG PO CAPS
12.0000 mg | ORAL_CAPSULE | ORAL | Status: AC
  Administered 2020-12-22: 12 mg via ORAL
  Filled 2020-12-22: qty 1

## 2020-12-22 MED ORDER — 0.9 % SODIUM CHLORIDE (POUR BTL) OPTIME
TOPICAL | Status: DC | PRN
  Administered 2020-12-22 (×2): 1000 mL

## 2020-12-22 MED ORDER — METRONIDAZOLE 500 MG/100ML IV SOLN
INTRAVENOUS | Status: AC
  Filled 2020-12-22: qty 100

## 2020-12-22 MED ORDER — IOHEXOL 350 MG/ML SOLN
75.0000 mL | Freq: Once | INTRAVENOUS | Status: AC | PRN
  Administered 2020-12-22: 75 mL via INTRAVENOUS

## 2020-12-22 MED ORDER — ONDANSETRON HCL 4 MG/2ML IJ SOLN
INTRAMUSCULAR | Status: DC | PRN
  Administered 2020-12-22: 4 mg via INTRAVENOUS

## 2020-12-22 MED ORDER — LIDOCAINE 2% (20 MG/ML) 5 ML SYRINGE
INTRAMUSCULAR | Status: DC | PRN
  Administered 2020-12-22: 60 mg via INTRAVENOUS

## 2020-12-22 MED ORDER — ONDANSETRON HCL 4 MG/2ML IJ SOLN
INTRAMUSCULAR | Status: AC
  Filled 2020-12-22: qty 2

## 2020-12-22 MED ORDER — ROCURONIUM BROMIDE 10 MG/ML (PF) SYRINGE
PREFILLED_SYRINGE | INTRAVENOUS | Status: AC
  Filled 2020-12-22: qty 10

## 2020-12-22 MED ORDER — DEXAMETHASONE SODIUM PHOSPHATE 10 MG/ML IJ SOLN
INTRAMUSCULAR | Status: DC | PRN
  Administered 2020-12-22: 10 mg via INTRAVENOUS

## 2020-12-22 MED ORDER — DEXAMETHASONE SODIUM PHOSPHATE 10 MG/ML IJ SOLN
INTRAMUSCULAR | Status: AC
  Filled 2020-12-22: qty 1

## 2020-12-22 MED ORDER — MIDAZOLAM HCL 5 MG/5ML IJ SOLN
INTRAMUSCULAR | Status: DC | PRN
  Administered 2020-12-22: 1 mg via INTRAVENOUS

## 2020-12-22 MED ORDER — AMISULPRIDE (ANTIEMETIC) 5 MG/2ML IV SOLN
10.0000 mg | Freq: Once | INTRAVENOUS | Status: AC | PRN
Start: 2020-12-22 — End: 2020-12-22
  Administered 2020-12-22: 10 mg via INTRAVENOUS

## 2020-12-22 MED ORDER — BUPIVACAINE-EPINEPHRINE (PF) 0.25% -1:200000 IJ SOLN
INTRAMUSCULAR | Status: AC
  Filled 2020-12-22: qty 30

## 2020-12-22 MED ORDER — AMISULPRIDE (ANTIEMETIC) 5 MG/2ML IV SOLN
INTRAVENOUS | Status: AC
  Filled 2020-12-22: qty 2

## 2020-12-22 MED ORDER — METRONIDAZOLE 500 MG/100ML IV SOLN
500.0000 mg | INTRAVENOUS | Status: AC
  Administered 2020-12-22: 500 mg via INTRAVENOUS

## 2020-12-22 MED ORDER — PHENYLEPHRINE 40 MCG/ML (10ML) SYRINGE FOR IV PUSH (FOR BLOOD PRESSURE SUPPORT)
PREFILLED_SYRINGE | INTRAVENOUS | Status: AC
  Filled 2020-12-22: qty 10

## 2020-12-22 MED ORDER — ENOXAPARIN SODIUM 40 MG/0.4ML IJ SOSY
40.0000 mg | PREFILLED_SYRINGE | INTRAMUSCULAR | Status: DC
  Administered 2020-12-23 – 2020-12-24 (×2): 40 mg via SUBCUTANEOUS
  Filled 2020-12-22 (×3): qty 0.4

## 2020-12-22 MED ORDER — PROPOFOL 10 MG/ML IV BOLUS
INTRAVENOUS | Status: DC | PRN
  Administered 2020-12-22: 100 mg via INTRAVENOUS
  Administered 2020-12-22: 20 mg via INTRAVENOUS

## 2020-12-22 MED ORDER — CHLORHEXIDINE GLUCONATE 0.12 % MT SOLN: 15.0000 mL | Freq: Once | OROMUCOSAL | Status: DC

## 2020-12-22 MED ORDER — HYDROMORPHONE HCL 1 MG/ML IJ SOLN
0.2500 mg | INTRAMUSCULAR | Status: DC | PRN
  Administered 2020-12-22: 0.25 mg via INTRAVENOUS
  Administered 2020-12-22: 0.5 mg via INTRAVENOUS

## 2020-12-22 MED ORDER — PROPOFOL 10 MG/ML IV BOLUS
INTRAVENOUS | Status: AC
  Filled 2020-12-22: qty 20

## 2020-12-22 MED ORDER — OXYCODONE HCL 5 MG PO TABS: 5.0000 mg | ORAL_TABLET | Freq: Once | ORAL | Status: DC | PRN

## 2020-12-22 MED ORDER — ORAL CARE MOUTH RINSE: 15.0000 mL | Freq: Once | OROMUCOSAL | Status: DC

## 2020-12-22 MED ORDER — CHLORHEXIDINE GLUCONATE 0.12 % MT SOLN
OROMUCOSAL | Status: AC
  Administered 2020-12-22: 15 mL via OROMUCOSAL
  Filled 2020-12-22: qty 15

## 2020-12-22 MED ORDER — CIPROFLOXACIN IN D5W 400 MG/200ML IV SOLN
INTRAVENOUS | Status: AC
  Filled 2020-12-22: qty 200

## 2020-12-22 MED ORDER — ROCURONIUM BROMIDE 10 MG/ML (PF) SYRINGE
PREFILLED_SYRINGE | INTRAVENOUS | Status: DC | PRN
  Administered 2020-12-22: 60 mg via INTRAVENOUS
  Administered 2020-12-22: 10 mg via INTRAVENOUS

## 2020-12-22 MED ORDER — CIPROFLOXACIN IN D5W 400 MG/200ML IV SOLN
400.0000 mg | INTRAVENOUS | Status: AC
  Administered 2020-12-22: 400 mg via INTRAVENOUS

## 2020-12-22 MED ORDER — BUPIVACAINE-EPINEPHRINE 0.25% -1:200000 IJ SOLN
INTRAMUSCULAR | Status: DC | PRN
  Administered 2020-12-22: 20 mL

## 2020-12-22 MED ORDER — FENTANYL CITRATE (PF) 250 MCG/5ML IJ SOLN
INTRAMUSCULAR | Status: DC | PRN
  Administered 2020-12-22: 50 ug via INTRAVENOUS
  Administered 2020-12-22: 25 ug via INTRAVENOUS
  Administered 2020-12-22: 50 ug via INTRAVENOUS

## 2020-12-22 MED ORDER — ALVIMOPAN 12 MG PO CAPS
12.0000 mg | ORAL_CAPSULE | Freq: Two times a day (BID) | ORAL | Status: DC
  Administered 2020-12-23 (×2): 12 mg via ORAL
  Filled 2020-12-22 (×3): qty 1

## 2020-12-22 MED ORDER — OXYCODONE HCL 5 MG/5ML PO SOLN: 5.0000 mg | Freq: Once | ORAL | Status: DC | PRN

## 2020-12-22 MED ORDER — CHLORHEXIDINE GLUCONATE CLOTH 2 % EX PADS
6.0000 | MEDICATED_PAD | Freq: Every day | CUTANEOUS | Status: DC
  Administered 2020-12-22: 6 via TOPICAL

## 2020-12-22 MED ORDER — ACETAMINOPHEN 500 MG PO TABS
1000.0000 mg | ORAL_TABLET | Freq: Once | ORAL | Status: AC
  Administered 2020-12-22: 1000 mg via ORAL

## 2020-12-22 MED ORDER — PHENYLEPHRINE 40 MCG/ML (10ML) SYRINGE FOR IV PUSH (FOR BLOOD PRESSURE SUPPORT): PREFILLED_SYRINGE | INTRAVENOUS | Status: DC | PRN

## 2020-12-22 MED ORDER — ORAL CARE MOUTH RINSE: 15.0000 mL | Freq: Once | OROMUCOSAL | Status: AC

## 2020-12-22 MED ORDER — SUGAMMADEX SODIUM 200 MG/2ML IV SOLN
INTRAVENOUS | Status: DC | PRN
  Administered 2020-12-22: 150 mg via INTRAVENOUS

## 2020-12-22 MED ORDER — LACTATED RINGERS IV SOLN: INTRAVENOUS | Status: DC

## 2020-12-22 MED ORDER — PHENYLEPHRINE 40 MCG/ML (10ML) SYRINGE FOR IV PUSH (FOR BLOOD PRESSURE SUPPORT)
PREFILLED_SYRINGE | INTRAVENOUS | Status: DC | PRN
  Administered 2020-12-22 (×2): 80 ug via INTRAVENOUS

## 2020-12-22 MED ORDER — ACETAMINOPHEN 500 MG PO TABS
1000.0000 mg | ORAL_TABLET | Freq: Four times a day (QID) | ORAL | Status: DC | PRN
  Administered 2020-12-23: 1000 mg via ORAL
  Filled 2020-12-22: qty 2

## 2020-12-22 MED ORDER — OXYCODONE HCL 5 MG PO TABS
5.0000 mg | ORAL_TABLET | ORAL | Status: DC | PRN
Start: 2020-12-22 — End: 2020-12-25
  Administered 2020-12-23: 5 mg via ORAL
  Filled 2020-12-22 (×2): qty 1

## 2020-12-22 MED ORDER — FENTANYL CITRATE (PF) 250 MCG/5ML IJ SOLN
INTRAMUSCULAR | Status: AC
  Filled 2020-12-22: qty 5

## 2020-12-22 SURGICAL SUPPLY — 45 items
CANISTER SUCT 3000ML PPV (MISCELLANEOUS) ×4 IMPLANT
CELLS DAT CNTRL 66122 CELL SVR (MISCELLANEOUS) ×1 IMPLANT
CHLORAPREP W/TINT 26 (MISCELLANEOUS) ×2 IMPLANT
COVER SURGICAL LIGHT HANDLE (MISCELLANEOUS) ×4 IMPLANT
DERMABOND ADVANCED (GAUZE/BANDAGES/DRESSINGS) ×1
DERMABOND ADVANCED .7 DNX12 (GAUZE/BANDAGES/DRESSINGS) ×1 IMPLANT
DRAPE WARM FLUID 44X44 (DRAPES) ×2 IMPLANT
DRSG OPSITE POSTOP 3X4 (GAUZE/BANDAGES/DRESSINGS) ×2 IMPLANT
DRSG OPSITE POSTOP 4X10 (GAUZE/BANDAGES/DRESSINGS) IMPLANT
DRSG OPSITE POSTOP 4X8 (GAUZE/BANDAGES/DRESSINGS) IMPLANT
ELECT BLADE 6.5 EXT (BLADE) ×2 IMPLANT
ELECT CAUTERY BLADE 6.4 (BLADE) ×2 IMPLANT
ELECT REM PT RETURN 9FT ADLT (ELECTROSURGICAL) ×2
ELECTRODE REM PT RTRN 9FT ADLT (ELECTROSURGICAL) ×1 IMPLANT
GLOVE SURG SIGNA 7.5 PF LTX (GLOVE) ×6 IMPLANT
GOWN STRL REUS W/ TWL LRG LVL3 (GOWN DISPOSABLE) ×6 IMPLANT
GOWN STRL REUS W/ TWL XL LVL3 (GOWN DISPOSABLE) ×2 IMPLANT
GOWN STRL REUS W/TWL LRG LVL3 (GOWN DISPOSABLE) ×6
GOWN STRL REUS W/TWL XL LVL3 (GOWN DISPOSABLE) ×2
KIT TURNOVER KIT B (KITS) ×2 IMPLANT
LIGASURE IMPACT 36 18CM CVD LR (INSTRUMENTS) ×2 IMPLANT
NS IRRIG 1000ML POUR BTL (IV SOLUTION) ×4 IMPLANT
PACK COLON (CUSTOM PROCEDURE TRAY) ×2 IMPLANT
PAD ARMBOARD 7.5X6 YLW CONV (MISCELLANEOUS) ×4 IMPLANT
PENCIL BUTTON HOLSTER BLD 10FT (ELECTRODE) ×2 IMPLANT
RELOAD PROXIMATE 75MM BLUE (ENDOMECHANICALS) ×4 IMPLANT
RTRCTR WOUND ALEXIS 18CM MED (MISCELLANEOUS) ×2
SHEARS HARMONIC ACE PLUS 36CM (ENDOMECHANICALS) ×2 IMPLANT
SLEEVE ENDOPATH XCEL 5M (ENDOMECHANICALS) ×2 IMPLANT
SPONGE T-LAP 18X18 ~~LOC~~+RFID (SPONGE) ×4 IMPLANT
STAPLER GUN LINEAR PROX 60 (STAPLE) ×2 IMPLANT
STAPLER PROXIMATE 75MM BLUE (STAPLE) ×2 IMPLANT
STAPLER VISISTAT 35W (STAPLE) ×2 IMPLANT
SUT PDS AB 1 TP1 96 (SUTURE) ×4 IMPLANT
SUT SILK 2 0 SH CR/8 (SUTURE) ×2 IMPLANT
SUT SILK 2 0 TIES 10X30 (SUTURE) ×2 IMPLANT
SUT SILK 3 0 SH CR/8 (SUTURE) ×2 IMPLANT
SUT SILK 3 0 TIES 10X30 (SUTURE) ×2 IMPLANT
TOWEL GREEN STERILE (TOWEL DISPOSABLE) ×2 IMPLANT
TRAY FOLEY MTR SLVR 16FR STAT (SET/KITS/TRAYS/PACK) ×2 IMPLANT
TROCAR XCEL BLUNT TIP 100MML (ENDOMECHANICALS) ×2 IMPLANT
TROCAR XCEL NON-BLD 5MMX100MML (ENDOMECHANICALS) ×2 IMPLANT
TUBE CONNECTING 12X1/4 (SUCTIONS) ×4 IMPLANT
TUBING EVAC SMOKE HEATED PNEUM (TUBING) ×2 IMPLANT
WATER STERILE IRR 1000ML POUR (IV SOLUTION) ×2 IMPLANT

## 2020-12-22 NOTE — Anesthesia Postprocedure Evaluation (Signed)
Anesthesia Post Note  Patient: Alexandria French  Procedure(s) Performed: LAPAROSCOPIC ASSISTED PARTIAL COLECTOMY     Patient location during evaluation: PACU Anesthesia Type: General Level of consciousness: awake and alert, oriented and patient cooperative Pain management: pain level controlled Vital Signs Assessment: post-procedure vital signs reviewed and stable Respiratory status: spontaneous breathing, nonlabored ventilation and respiratory function stable Cardiovascular status: blood pressure returned to baseline and stable Postop Assessment: no apparent nausea or vomiting Anesthetic complications: no   No notable events documented.  Last Vitals:  Vitals:   12/22/20 0504 12/22/20 0824  BP: 118/70 (!) 112/46  Pulse: 65 65  Resp: 17 20  Temp: 36.9 C 36.8 C  SpO2: 98% 95%    Last Pain:  Vitals:   12/22/20 1113  TempSrc:   PainSc: [place]

## 2020-12-22 NOTE — Progress Notes (Signed)
[name], female   DOB: May 22, 1950, 70 y.o.   MRN: [id number]  Pre Procedure note for inpatients:   Alexandria French has been scheduled for a LAPAROSCOPIC ASSISTED PARTIAL COLECTOMY today. The various methods of treatment have been discussed with the patient. After consideration of the risks, benefits and treatment options the patient has consented to the planned procedure.   The patient has been seen and labs reviewed. There are no changes in the patient's condition to prevent proceeding with the planned procedure today.  Recent labs:  Lab Results  Component Value Date   WBC 5.3 12/22/2020   HGB 8.2 (L) 12/22/2020   HCT 25.0 (L) 12/22/2020   PLT 256 12/22/2020   GLUCOSE 87 12/22/2020   NA 132 (L) 12/22/2020   K 3.4 (L) 12/22/2020   CL 97 (L) 12/22/2020   CREATININE 0.92 12/22/2020   BUN 7 (L) 12/22/2020   CO2 27 12/22/2020   INR 1.1 12/14/2020    Coralie Keens, MD 12/22/2020 8:16 AM

## 2020-12-22 NOTE — Progress Notes (Signed)
Cardiology Progress Note  Patient ID: Yong Wahlquist MRN: [id number] DOB: [birth year] Date of Encounter: 12/22/2020  Primary Cardiologist: Sanda Klein, MD  Subjective   Chief Complaint: none.   HPI: CT abdomen pelvis without any evidence of metastatic disease.  Pleural effusions have resolved.  Breathing comfortably.  Denies any symptoms.  ROS:  All other ROS reviewed and negative. Pertinent positives noted in the HPI.     Inpatient Medications  Scheduled Meds:  acetaminophen  1,000 mg Oral Once   alvimopan  12 mg Oral On Call to OR   [facility] Hold] vitamin C  500 mg Oral Daily   [MAR Hold] calcium-vitamin D  1 tablet Oral BID   chlorhexidine  15 mL Mouth/Throat Once   Or   mouth rinse  15 mL Mouth Rinse Once   chlorhexidine  15 mL Mouth/Throat Once   Or   mouth rinse  15 mL Mouth Rinse Once   chlorhexidine       [MAR Hold] hydrocortisone  25 mg Rectal BID   influenza vaccine adjuvanted  0.5 mL Intramuscular Tomorrow-1000   [MAR Hold] loratadine  10 mg Oral Daily   [MAR Hold] LORazepam  0.5 mg Oral Q48H   [MAR Hold] melatonin  3 mg Oral QHS   [MAR Hold] metoprolol tartrate  50 mg Oral BID   [MAR Hold] midodrine  5 mg Oral TID WC   pneumococcal 23 valent vaccine  0.5 mL Intramuscular Tomorrow-1000   [MAR Hold] potassium chloride  20 mEq Oral BID   [MAR Hold] simvastatin  20 mg Oral Daily   [MAR Hold] sodium chloride flush  3 mL Intravenous Q12H   Continuous Infusions:  lactated ringers     lactated ringers     PRN Meds: [MAR Hold] acetaminophen, [MAR Hold] ondansetron (ZOFRAN) IV, [MAR Hold] sodium chloride flush, [MAR Hold] zolpidem   Vital Signs   Vitals:   12/21/20 1651 12/21/20 2001 12/22/20 0504 12/22/20 0824  BP: 117/70 115/77 118/70 (!) 112/46  Pulse:  71 65 65  Resp:  18 17 20   Temp:  98.8 F (37.1 C) 98.4 F (36.9 C) 98.2 F (36.8 C)  TempSrc:  Oral Oral Oral  SpO2:  99% 98% 95%  Weight:   60 kg 60 kg  Height:    5\' 2"  (1.575 m)    Intake/Output  Summary (Last 24 hours) at 12/22/2020 0831 Last data filed at 12/22/2020 0030 Gross per 24 hour  Intake 243 ml  Output 450 ml  Net -207 ml   Last 3 Weights 12/22/2020 12/22/2020 12/21/2020  Weight (lbs) 132 lb 4.4 oz 132 lb 4.4 oz 131 lb 6.3 oz  Weight (kg) 60 kg 60 kg 59.6 kg      Telemetry  Overnight telemetry shows sinus rhythm heart rate in the 70s, intermittent V pacing, which I personally reviewed.   Physical Exam   Vitals:   12/21/20 1651 12/21/20 2001 12/22/20 0504 12/22/20 0824  BP: 117/70 115/77 118/70 (!) 112/46  Pulse:  71 65 65  Resp:  18 17 20   Temp:  98.8 F (37.1 C) 98.4 F (36.9 C) 98.2 F (36.8 C)  TempSrc:  Oral Oral Oral  SpO2:  99% 98% 95%  Weight:   60 kg 60 kg  Height:    5\' 2"  (1.575 m)    Intake/Output Summary (Last 24 hours) at 12/22/2020 0831 Last data filed at 12/22/2020 0030 Gross per 24 hour  Intake 243 ml  Output 450 ml  Net -207 ml  Last 3 Weights 12/22/2020 12/22/2020 12/21/2020  Weight (lbs) 132 lb 4.4 oz 132 lb 4.4 oz 131 lb 6.3 oz  Weight (kg) 60 kg 60 kg 59.6 kg    Body mass index is 24.19 kg/m.   General: Well nourished, well developed, in no acute distress Head: Atraumatic, normal size  Eyes: PEERLA, EOMI  Neck: Supple, no JVD Endocrine: No thryomegaly Cardiac: Normal S1, S2; RRR; 3 out of 6 systolic ejection murmur Lungs: Clear to auscultation bilaterally, no wheezing, rhonchi or rales  Abd: Soft, nontender, no hepatomegaly  Ext: No edema, pulses 2+ Musculoskeletal: No deformities, BUE and BLE strength normal and equal Skin: Warm and dry, no rashes   Neuro: Alert and oriented to person, place, time, and situation, CNII-XII grossly intact, no focal deficits  Psych: Normal mood and affect   Labs  High Sensitivity Troponin:   Recent Labs  Lab 12/14/20 2130 12/14/20 2345 12/15/20 0618 12/15/20 0817  TROPONINIHS 593* 502* 601* 652*     Cardiac EnzymesNo results for input(s): TROPONINI in the last 168 hours. No results for  input(s): TROPIPOC in the last 168 hours.  Chemistry Recent Labs  Lab 12/20/20 0217 12/21/20 0201 12/22/20 0040  NA 138 134* 132*  K 3.6 3.4* 3.4*  CL 97* 96* 97*  CO2 23 27 27   GLUCOSE 110* 84 87  BUN 16 11 7*  CREATININE 1.29* 0.86 0.92  CALCIUM 10.4* 10.1 9.6  GFRNONAA 45* >60 >60  ANIONGAP 18* 11 8    Hematology Recent Labs  Lab 12/20/20 0449 12/21/20 0201 12/22/20 0040  WBC 7.1 4.8 5.3  RBC 3.02* 2.96* 2.61*  HGB 9.5* 9.2* 8.2*  HCT 29.0* 28.5* 25.0*  MCV 96.0 96.3 95.8  MCH 31.5 31.1 31.4  MCHC 32.8 32.3 32.8  RDW 13.5 13.4 13.4  PLT 313 292 256   BNPNo results for input(s): BNP, PROBNP in the last 168 hours.  DDimer No results for input(s): DDIMER in the last 168 hours.   Radiology  CT ABDOMEN PELVIS W CONTRAST  Result Date: 12/22/2020 CLINICAL DATA:  Probable abdominal mass. EXAM: CT ABDOMEN AND PELVIS WITH CONTRAST TECHNIQUE: Multidetector CT imaging of the abdomen and pelvis was performed using the standard protocol following bolus administration of intravenous contrast. CONTRAST:  77mL OMNIPAQUE IOHEXOL 350 MG/ML SOLN COMPARISON:  None. FINDINGS: Lower chest: Trace bilateral pleural effusions with minimal bibasilar atelectasis. Cardiac pacemaker wires noted. No intra-abdominal free air or free fluid. Hepatobiliary: Focal area of calcification of the posterior aspect of the inferior right liver capsule likely sequela of prior insult. The liver is otherwise unremarkable. No intrahepatic biliary ductal dilatation. The gallbladder is unremarkable. Pancreas: Unremarkable. No pancreatic ductal dilatation or surrounding inflammatory changes. Spleen: Normal in size without focal abnormality. Adrenals/Urinary Tract: The adrenal glands unremarkable. There is a 15 mm left renal upper pole cyst. There is no hydronephrosis on either side. The visualized ureters and urinary bladder appear unremarkable. Stomach/Bowel: There is colonic diverticulosis without active inflammatory  changes. Small hiatal hernia. There is no bowel obstruction or active inflammation. The appendix is normal. Vascular/Lymphatic: Moderate aortoiliac atherosclerotic disease. The IVC is unremarkable. No portal venous gas. There is no adenopathy. Reproductive: Hysterectomy.  No adnexal masses. Other: None Musculoskeletal: Degenerative changes of the spine. No acute osseous pathology. IMPRESSION: 1. No acute intra-abdominal or pelvic pathology. 2. Colonic diverticulosis. No bowel obstruction. Normal appendix. 3. Trace bilateral pleural effusions. 4. Aortic Atherosclerosis (ICD10-I70.0). Electronically Signed   By: Anner Crete M.D.   On: 12/22/2020 01:54  Cardiac Studies  LHC 12/15/2020 SUMMARY Angiographically Normal Coronary arteries with a left dominant system. Significant LVOT gradient with apical LV pressures of 184/12 mmHg with an EDP of 25, mid LV cavity 157/9 mmHg with EDP of 25 and outflow tract pressure of 111/9 mmHg with a stable EDP of 24 mmHg. Mild Secondary Pulmonary Hypertension with mean PAP of 29 mmHg and PCWP of 29 mmHg. Moderately reduced cardiac function with a cardiac output-index of 3.26 and 2.02 by Fick and 3.55-2.2 by thermodilution.  TTE 12/15/2020  1. Left ventricular ejection fraction, by estimation, is 55-60%. The left  ventricle demonstrates regional wall motion abnormalities that are  consistent with a left bundle branch block.There is severe asymmetric left  ventricular hypertrophy. Left  ventricular diastolic parameters are consistent with Grade I diastolic  dysfunction (impaired relaxation). No significant LVOT Obstructive seen in  this study.   2. Right ventricular systolic function is normal. The right ventricular  size is normal. There is normal pulmonary artery systolic pressure.   3. The mitral valve is normal in structure. Mild mitral valve  regurgitation. No evidence of mitral stenosis.   4. The aortic valve was not well visualized. Aortic valve  regurgitation  is trivial. No aortic stenosis is present.   5. The inferior vena cava is normal in size with <50% respiratory  variability, suggesting right atrial pressure of 8 mmHg.   6. Technically difficult acquisition; in future studies consider use of  echo-contrast.   Patient Profile  Alexandria French is a 70 y.o. female with hypertrophic obstructive cardiomyopathy, paroxysmal atrial fibrillation on Eliquis, sick sinus syndrome status post pacemaker, hypertension, nonobstructive CAD who was admitted on 12/15/2020 with dizziness acute hypoxic respiratory failure secondary to decompensated diastolic heart failure.  She underwent left and right heart catheterization which showed volume overload and no evidence of CAD.  She was compensated based on pulmonary venous saturation.  Course has been complicated by anemia and she has now been found to have a colonic mass.    Assessment & Plan   #Preoperative assessment -Proceeding to colon cancer surgery today.  Cardiology available as needed. -If she becomes hypotensive during the case would recommend phenylephrine and avoid agents that increase cardiac contractility.  This could worsen her outflow tract obstruction.  #Acute HFpEF #Hypertrophic cardiomyopathy -Admitted with dizziness and fatigue.  Also with chronic hypotension.  Was given intravenous fluids in the emergency room that created volume overload. -LVOT gradients on echo were not that impressive but poor study.  She had a peak to peak gradient of 70 mmHg on cath.  This is really unchanged from prior testing.  It was felt this was not the etiology of her hypotension and did not appear to be worsening.  This is improved with midodrine and diuresis. -From a hypertrophic cardiomyopathy standpoint would recommend is to continue metoprolol.  Can add back medications as anemia improves and blood pressure improves. -She has been diuresed. -No evidence of an acute coronary syndrome. -CT abdomen  pelvis shows resolution of her pleural effusions. -Would recommend Lasix as needed.  #Neurogenic orthostatic hypotension -Admitted with hypotension in the setting of anemia as well as what I suspect is neurogenic orthostatic hypotension.  She had rapid and profound fluctuations in blood pressure upon position change.  Symptoms have improved drastically with midodrine 5 mg 3 times daily as well as compression stockings.  Would recommend to continue this. -She is ruled out for infection.  Lactic acid is been normal.  Procalcitonin is normal.  She  was not in shock from a cardiac standpoint based on right heart cath numbers.  Overall I feel this is the diagnosis.  #Iron deficiency anemia #Colon cancer -Admitted with hemoglobin trending down.  Was on Eliquis and this was stopped.  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  CT abdomen pelvis with no evidence of metastatic disease. -I do suspect her anemia was worsening her symptoms of shortness of breath. -She will proceed to surgery for removal today. -Holding Eliquis can be restarted after surgery at the discretion of surgeon.  #Paroxysmal A. Fib -Maintaining sinus rhythm.  Holding Eliquis. -She is on a beta-blocker for her hypertrophic cardiomyopathy. -Restart Eliquis after surgery at the discretion of surgery.  #AKI, resolved -Secondary to overdiuresis.  Creatinine has normalized.  #Non-STEMI, ruled out -Troponin was elevated in the setting of hypotension hypertrophic cardiomyopathy.  Normal coronary arteries. -Aspirin and statin were stopped.  For questions or updates, please contact [facility] Please consult [email or web address] for contact info under   Time Spent with Patient: I have spent a total of 25 minutes with patient reviewing hospital notes, telemetry, EKGs, labs and examining the patient as well as establishing an assessment and plan that was discussed with the patient.  > 50% of time was spent in direct patient care.     Signed, Addison Naegeli. Audie Box, MD, [place]  12/22/2020 8:31 AM

## 2020-12-22 NOTE — Anesthesia Preprocedure Evaluation (Addendum)
Anesthesia Evaluation  Patient identified by MRN, date of birth, ID band Patient awake    Reviewed: Allergy & Precautions, NPO status , Patient's Chart, lab work & pertinent test results, reviewed documented beta blocker date and time   Airway Mallampati: III  TM Distance: >3 FB Neck ROM: Full  Mouth opening: Limited Mouth Opening  Dental no notable dental hx. (+) Teeth Intact, Dental Advisory Given, Chipped,    Pulmonary neg pulmonary ROS,    Pulmonary exam normal breath sounds clear to auscultation       Cardiovascular hypertension, Pt. on medications and Pt. on home beta blockers + CAD  Normal cardiovascular exam+ dysrhythmias (eliquis) Atrial Fibrillation + pacemaker (sick sinus s/p PPM 2021)  Rhythm:Regular Rate:Normal     Neuro/Psych negative neurological ROS  negative psych ROS   GI/Hepatic Neg liver ROS, GERD  Controlled,Cecal mass   Endo/Other  negative endocrine ROS  Renal/GU negative Renal ROS  negative genitourinary   Musculoskeletal negative musculoskeletal ROS (+)   Abdominal   Peds  Hematology  (+) Blood dyscrasia, anemia , H/H 8.2/25, plt 256   Anesthesia Other Findings   Reproductive/Obstetrics negative OB ROS                            Anesthesia Physical Anesthesia Plan  ASA: 3  Anesthesia Plan: General   Post-op Pain Management:    Induction: Intravenous  PONV Risk Score and Plan: 3 and Ondansetron, Dexamethasone, Midazolam and Treatment may vary due to age or medical condition  Airway Management Planned: Oral ETT  Additional Equipment: None  Intra-op Plan:   Post-operative Plan: Extubation in OR  Informed Consent: I have reviewed the patients History and Physical, chart, labs and discussed the procedure including the risks, benefits and alternatives for the proposed anesthesia with the patient or authorized representative who has indicated his/her  understanding and acceptance.     Dental advisory given  Plan Discussed with: CRNA  Anesthesia Plan Comments:        Anesthesia Quick Evaluation

## 2020-12-22 NOTE — Progress Notes (Addendum)
[facility] PROGRESS NOTE    Progress Note  Alexandria French  [name]:[id number] DOB: 16-Apr-1950 DOA: 12/14/2020 PCP: [place] Nation, MD     Brief Narrative:   Alexandria French is an 70 y.o. female past medical history significant for hypertrophic obstructive cardiomyopathy, paroxysmal atrial fibrillation on Eliquis, sick sinus syndrome with a pacemaker presents with increased shortness of breath and a near syncopal episode   Significant studies: Cardiac cath 12/19/2020 showed nonobstructive disease moderate reduced cardiac output.  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 CT scan of the chest does not show metastatic disease. Laparoscopic ileocolectomy on 12/22/2020. Surgery to dictate when to start Eliquis.  Acute on chronic diastolic heart failure Secondary to [facility]. She was diuresed and her creatinine has remained on the rise appears euvolemic on physical exam. Has improved to baseline with holding of diuretic therapy. Continue on metoprolol. Cardiac cath showed moderately reduced cardiac output to start on low-dose midodrine. Cardiology relate that his symptoms are probably due to his anemia. She is currently 59 kg.  Neurogenic orthostatic hypotension : Have improved with midodrine. Continue compression stockings.  Elevated troponins: Cardiac cath showed nonobstructive disease.  Mild hypokalemia: Continues to be low recheck tomorrow morning.  Paroxysmal atrial fibrillation: Holding Eliquis now in sinus rhythm.  Continue metoprolol.  Injury: Likely due to overdiuresis diuretics were held  creatinine has returned to baseline.   DVT prophylaxis: SCDs Family Communication:none Status is: Inpatient  Remains inpatient appropriate because:Hemodynamically unstable  Dispo: The patient is from: Home              Anticipated d/c is to: Home              Patient currently is not medically stable to d/c.   Difficult to place patient No     Code Status:     Code Status Orders  (From admission, onward)           Start     Ordered   12/15/20 0414  Full code  Continuous        12/15/20 0415           Code Status History     Date Active Date Inactive Code Status Order ID Comments User Context   02/28/2020 1519 02/28/2020 2157 Full Code [id number]  Croitoru, Dani Gobble, MD Inpatient      Advance Directive Documentation    Flowsheet Row Most Recent Value  Type of Advance Directive Healthcare Power of [place], Living will  Pre-existing out of facility DNR order (yellow form or pink MOST form) --  "MOST" Form in Place? --         IV Access:   Peripheral IV   Procedures and diagnostic studies:   CT ABDOMEN PELVIS W CONTRAST  Result Date: 12/22/2020 CLINICAL DATA:  Probable abdominal mass. EXAM: CT ABDOMEN AND PELVIS WITH CONTRAST TECHNIQUE: Multidetector CT imaging of the abdomen and pelvis was performed using the standard protocol following bolus administration of intravenous contrast. CONTRAST:  81mL OMNIPAQUE IOHEXOL 350 MG/ML SOLN COMPARISON:  None. FINDINGS: Lower chest: Trace bilateral pleural effusions with minimal bibasilar atelectasis. Cardiac pacemaker wires noted. No intra-abdominal free air or free fluid. Hepatobiliary: Focal area of calcification of the posterior aspect  of the inferior right liver capsule likely sequela of prior insult. The liver is otherwise unremarkable. No intrahepatic biliary ductal dilatation. The gallbladder is unremarkable. Pancreas: Unremarkable. No pancreatic ductal dilatation or surrounding inflammatory changes. Spleen:  Normal in size without focal abnormality. Adrenals/Urinary Tract: The adrenal glands unremarkable. There is a 15 mm left renal upper pole cyst. There is no hydronephrosis on either side. The visualized ureters and urinary bladder appear unremarkable. Stomach/Bowel: There is colonic diverticulosis without active inflammatory changes. Small hiatal hernia. There is no bowel obstruction or active inflammation. The appendix is normal. Vascular/Lymphatic: Moderate aortoiliac atherosclerotic disease. The IVC is unremarkable. No portal venous gas. There is no adenopathy. Reproductive: Hysterectomy.  No adnexal masses. Other: None Musculoskeletal: Degenerative changes of the spine. No acute osseous pathology. IMPRESSION: 1. No acute intra-abdominal or pelvic pathology. 2. Colonic diverticulosis. No bowel obstruction. Normal appendix. 3. Trace bilateral pleural effusions. 4. Aortic Atherosclerosis (ICD10-I70.0). Electronically Signed   By: Anner Crete M.D.   On: 12/22/2020 01:54     Medical Consultants:   None.   Subjective:    [name] complaining of abdominal discomfort post-surgery.  Objective:    Vitals:   12/21/20 1651 12/21/20 2001 12/22/20 0504 12/22/20 0824  BP: 117/70 115/77 118/70 (!) 112/46  Pulse:  71 65 65  Resp:  18 17 20   Temp:  98.8 F (37.1 C) 98.4 F (36.9 C) 98.2 F (36.8 C)  TempSrc:  Oral Oral Oral  SpO2:  99% 98% 95%  Weight:   60 kg 60 kg  Height:    5\' 2"  (1.575 m)   SpO2: 95 % O2 Flow Rate (L/min): 1 L/min   Intake/Output Summary (Last 24 hours) at 12/22/2020 1116 Last data filed at 12/22/2020 1042 Gross per 24 hour  Intake 990 ml  Output 500 ml  Net 490 ml    Filed Weights   12/21/20 0503 12/22/20 0504 12/22/20 0824  Weight: 59.6 kg 60 kg 60 kg    Exam: General exam: In no acute distress. Respiratory system: Good air movement and clear to auscultation. Cardiovascular system: S1 & S2 heard, RRR. No JVD. Gastrointestinal system: Abdomen is  nondistended, soft and nontender.  Extremities: No pedal edema. Skin: No rashes, lesions or ulcers Psychiatry: Judgement and insight appear normal. Mood & affect appropriate.   Data Reviewed:    Labs: Basic Metabolic Panel: Recent Labs  Lab 12/18/20 0348 12/19/20 0215 12/20/20 0217 12/21/20 0201 12/22/20 0040  NA 135 136 138 134* 132*  K 3.6 3.4* 3.6 3.4* 3.4*  CL 97* 96* 97* 96* 97*  CO2 28 29 23 27 27   GLUCOSE 99 101* 110* 84 87  BUN 15 18 16 11  7*  CREATININE 0.99 0.97 1.29* 0.86 0.92  CALCIUM 8.5* 9.2 10.4* 10.1 9.6  MG 2.4 2.3 2.3 2.2 2.0    GFR Estimated Creatinine Clearance: 46.9 mL/min (by C-G formula based on SCr of 0.92 mg/dL). Liver Function Tests: No results for input(s): AST, ALT, ALKPHOS, BILITOT, PROT, ALBUMIN in the last 168 hours. No results for input(s): LIPASE, AMYLASE in the last 168 hours. No results for input(s): AMMONIA in the last 168 hours. Coagulation profile No results for input(s): INR, PROTIME in the last 168 hours.  COVID-19 Labs  No results for input(s): DDIMER, FERRITIN, LDH, CRP in the last 72 hours.   Lab Results  Component Value Date   SARSCOV2NAA NEGATIVE 12/14/2020   [place] NEGATIVE 02/25/2020    CBC: Recent Labs  Lab 12/15/20 1601 12/16/20 0127  12/18/20 0348 12/19/20 0215 12/20/20 0449 12/21/20 0201 12/22/20 0040  WBC 12.2*   < > 6.1 5.1 7.1 4.8 5.3  NEUTROABS 11.2*  --   --   --   --   --   --   HGB 10.3*   < > 7.8* 8.1* 9.5* 9.2* 8.2*  HCT 32.0*   < > 24.3* 25.9* 29.0* 28.5* 25.0*  MCV 98.8   < > 98.4 98.1 96.0 96.3 95.8  PLT 288   < > 209 251 313 292 256   < > = values in this interval not displayed.    Cardiac Enzymes: No results for input(s): CKTOTAL, CKMB, CKMBINDEX, TROPONINI in the last 168 hours. BNP (last 3 results) No results for input(s): PROBNP in the last 8760 hours. CBG: Recent Labs  Lab 12/15/20 1400  GLUCAP 114*    D-Dimer: No results for input(s): DDIMER in the last 72  hours. Hgb A1c: No results for input(s): HGBA1C in the last 72 hours. Lipid Profile: No results for input(s): CHOL, HDL, LDLCALC, TRIG, CHOLHDL, LDLDIRECT in the last 72 hours. Thyroid function studies: No results for input(s): TSH, T4TOTAL, T3FREE, THYROIDAB in the last 72 hours.  Invalid input(s): FREET3 Anemia work up: No results for input(s): VITAMINB12, FOLATE, FERRITIN, TIBC, IRON, RETICCTPCT in the last 72 hours.  Sepsis Labs: Recent Labs  Lab 12/16/20 0127 12/17/20 0338 12/18/20 0348 12/19/20 0215 12/20/20 0449 12/21/20 0201 12/22/20 0040  PROCALCITON <0.10 <0.10  --   --   --   --   --   WBC 12.8* 10.0   < > 5.1 7.1 4.8 5.3   < > = values in this interval not displayed.    Microbiology Recent Results (from the past 240 hour(s))  Resp Panel by RT-PCR (Flu A&B, Covid) Nasopharyngeal Swab     Status: None   Collection Time: 12/14/20 11:45 PM   Specimen: Nasopharyngeal Swab; Nasopharyngeal(NP) swabs in vial transport medium  Result Value Ref Range Status   SARS Coronavirus 2 by RT PCR NEGATIVE NEGATIVE Final    Comment: (NOTE) SARS-CoV-2 target nucleic acids are NOT DETECTED.  The SARS-CoV-2 RNA is generally detectable in upper respiratory specimens during the acute phase of infection. The lowest concentration of SARS-CoV-2 viral copies this assay can detect is 138 copies/mL. A negative result does not preclude SARS-Cov-2 infection and should not be used as the sole basis for treatment or other patient management decisions. A negative result may occur with  improper specimen collection/handling, submission of specimen other than nasopharyngeal swab, presence of viral mutation(s) within the areas targeted by this assay, and inadequate number of viral copies(<138 copies/mL). A negative result must be combined with clinical observations, patient history, and epidemiological information. The expected result is Negative.  Fact Sheet for Patients:   EntrepreneurPulse.com.au  Fact Sheet for Healthcare Providers:  IncredibleEmployment.be  This test is no t yet approved or cleared by the [place] FDA and  has been authorized for detection and/or diagnosis of SARS-CoV-2 by FDA under an Emergency Use Authorization (EUA). This EUA will remain  in effect (meaning this test can be used) for the duration of the COVID-19 declaration under Section 564(b)(1) of the Act, 21 U.S.C.section 360bbb-3(b)(1), unless the authorization is terminated  or revoked sooner.       Influenza A by PCR NEGATIVE NEGATIVE Final   Influenza B by PCR NEGATIVE NEGATIVE Final    Comment: (NOTE) The Xpert Xpress SARS-CoV-2/FLU/RSV plus assay is intended as an aid in the  diagnosis of influenza from Nasopharyngeal swab specimens and should not be used as a sole basis for treatment. Nasal washings and aspirates are unacceptable for Xpert Xpress SARS-CoV-2/FLU/RSV testing.  Fact Sheet for Patients: EntrepreneurPulse.com.au  Fact Sheet for Healthcare Providers: IncredibleEmployment.be  This test is not yet approved or cleared by the [place] FDA and has been authorized for detection and/or diagnosis of SARS-CoV-2 by FDA under an Emergency Use Authorization (EUA). This EUA will remain in effect (meaning this test can be used) for the duration of the COVID-19 declaration under Section 564(b)(1) of the Act, 21 U.S.C. section 360bbb-3(b)(1), unless the authorization is terminated or revoked.  Performed at [facility], [address], [place], [place] [address]   Culture, blood (routine x 2)     Status: None   Collection Time: 12/15/20  9:52 AM   Specimen: BLOOD  Result Value Ref Range Status   Specimen Description BLOOD RIGHT ANTECUBITAL  Final   Special Requests   Final    BOTTLES DRAWN AEROBIC AND ANAEROBIC Blood Culture adequate volume   Culture   Final    NO GROWTH 5  DAYS Performed at [facility], [address] [place], [place] [address]    Report Status 12/20/2020 FINAL  Final  Culture, blood (routine x 2)     Status: None   Collection Time: 12/15/20 10:03 AM   Specimen: BLOOD LEFT FOREARM  Result Value Ref Range Status   Specimen Description BLOOD LEFT FOREARM  Final   Special Requests   Final    BOTTLES DRAWN AEROBIC AND ANAEROBIC Blood Culture adequate volume   Culture   Final    NO GROWTH 5 DAYS Performed at [facility], [place] [address] [place], [place] [address]    Report Status 12/20/2020 FINAL  Final  MRSA Next Gen by PCR, Nasal     Status: None   Collection Time: 12/15/20  3:00 PM   Specimen: Nasal Mucosa; Nasal Swab  Result Value Ref Range Status   MRSA by PCR Next Gen NOT DETECTED NOT DETECTED Final    Comment: (NOTE) The GeneXpert MRSA Assay (FDA approved for NASAL specimens only), is one component of a comprehensive MRSA colonization surveillance program. It is not intended to diagnose MRSA infection nor to guide or monitor treatment for MRSA infections. Test performance is not FDA approved in patients less than 7 years old. Performed at [facility], [place] [address] [place], [place] [address]   Surgical pcr screen     Status: None   Collection Time: 12/22/20  5:38 AM   Specimen: Nasal Mucosa; Nasal Swab  Result Value Ref Range Status   MRSA, PCR NEGATIVE NEGATIVE Final   Staphylococcus aureus NEGATIVE NEGATIVE Final    Comment: (NOTE) The Xpert SA Assay (FDA approved for NASAL specimens in patients 19 years of age and older), is one component of a comprehensive surveillance program. It is not intended to diagnose infection nor to guide or monitor treatment. Performed at [facility], [place] [address] [place], [place] [address]      Medications:    amisulpride       [MAR Hold] vitamin C  500 mg Oral Daily   [MAR Hold] calcium-vitamin D  1 tablet Oral BID   chlorhexidine  15 mL  Mouth/Throat Once   Or   mouth rinse  15 mL Mouth Rinse Once   [MAR Hold] hydrocortisone  25 mg Rectal BID   HYDROmorphone  influenza vaccine adjuvanted  0.5 mL Intramuscular Tomorrow-1000   [MAR Hold] loratadine  10 mg Oral Daily   [MAR Hold] LORazepam  0.5 mg Oral Q48H   [MAR Hold] melatonin  3 mg Oral QHS   [MAR Hold] metoprolol tartrate  50 mg Oral BID   [MAR Hold] midodrine  5 mg Oral TID WC   pneumococcal 23 valent vaccine  0.5 mL Intramuscular Tomorrow-1000   [MAR Hold] potassium chloride  20 mEq Oral BID   [MAR Hold] simvastatin  20 mg Oral Daily   [MAR Hold] sodium chloride flush  3 mL Intravenous Q12H   Continuous Infusions:  lactated ringers     lactated ringers        LOS: 7 days   Charlynne Cousins  [facility]  12/22/2020, 11:16 AM

## 2020-12-22 NOTE — Anesthesia Procedure Notes (Signed)
Procedure Name: Intubation Date/Time: 12/22/2020 9:15 AM Performed by: Jenne Campus, CRNA Pre-anesthesia Checklist: Patient identified, Emergency Drugs available, Suction available and Patient being monitored Patient Re-evaluated:Patient Re-evaluated prior to induction Oxygen Delivery Method: Circle System Utilized Preoxygenation: Pre-oxygenation with 100% oxygen Induction Type: IV induction and Cricoid Pressure applied Ventilation: Mask ventilation without difficulty Laryngoscope Size: Miller and 2 Grade View: Grade II Tube type: Oral Tube size: 7.0 mm Number of attempts: 1 Airway Equipment and Method: Stylet Placement Confirmation: ETT inserted through vocal cords under direct vision, positive ETCO2 and breath sounds checked- equal and bilateral Secured at: 21 cm Tube secured with: Tape Dental Injury: Teeth and Oropharynx as per pre-operative assessment

## 2020-12-22 NOTE — Op Note (Addendum)
LAPAROSCOPIC ASSISTED PARTIAL COLECTOMY  Procedure Note  Alexandria French 12/14/2020 - 12/22/2020   Pre-op Diagnosis: CECAL MASS     Post-op Diagnosis: same  Procedure(s): LAPAROSCOPIC ASSISTED ILEOCOLECTOMY  Surgeon(s): Coralie Keens, MD  Assistant:  Drucie Ip, MD  [facility] Resident  Anesthesia: General  Staff:  Circulator: Rozell Searing, RN; Melody Comas, RN Relief Circulator: Zannie Kehr, RN; Eddie North, RN Scrub Person: Zannie Kehr, RN; Rolan Bucco  Estimated Blood Loss: Minimal               Specimens: sent to path  Indications: This is a 70 year old female who presented with dizziness.  During her work-up she was found to be anemic.  She underwent a colonoscopy showing a mass in the cecum.  Biopsy showed this to be an adenocarcinoma.  Preoperative CT scan of the abdomen pelvis was otherwise unremarkable regarding metastatic disease.  The decision was made to proceed to the operating room for a laparoscopic assisted partial colectomy  Findings: The patient was found to have a mass in the cecum without evidence of invasion through the bowel wall and no gross evidence of metastatic disease  Procedure: The patient is brought to operating identifies correct patient.  She is placed upon the operating table general anesthesia was induced.  Her abdomen was then prepped and draped in usual sterile fashion.  I made a small incision just below the umbilicus with a scalpel.  I took this down to the fascia which was then opened with a scalpel.  Hemostat was then used to pass into the peritoneal cavity under direct vision.  A 0 Vicryl pursing suture was placed around the fascial opening.  [facility] port was placed the opening insufflation of the abdomen was again.  We placed a 5 mm trocar in the patient's epigastrium and another in the lower midline both under direct vision.  Adhesions were taken down of omentum to the abdominal wall with the harmonic scalpel.   We could then easily visualize the cecum and appendix.  The right colon was mobilized along the white line of [place] with the harmonic scalpel and the hepatic flexure was taken down with a scalpel as well.  I dissected omentum off of the side of the colon also with the amount of scalpel.  The distal ileum was easily mobilized.  At this point after further mobilizing the right colon I could extend past the midline so the decision made to convert to the open portion of the procedure.  All ports were removed.  I extended the umbilical incision around and above the umbilicus with a scalpel.  I then took this down to the fascia which was opened up further with the cautery.  A wound protector was then placed.  I was then able to eviscerate the cecum with a easily palpable mass in place.  I further pulled out the distal ileum and more proximal right colon through the incision.  I then transected the distal ileum with a GIA 75 stapler.  The mesentery was then taken down with LigaSure.  I then transected the distal right colon with the GIA-75 stapler as well.  The rest of the mesentery was then taken down with the LigaSure.  The right colon including the appendix and terminal ileum were then sent to pathology for evaluation.  I then reapproximated the colon to the small bowel in a cytocide fashion with interrupted silk sutures.  A colotomy and enterotomy was performed.  A side-to-side  anastomosis was then performed with a GIA 75 stapler.  The opening was then closed with a TX 60 stapler.  The anastomosis appeared widely patent and well perfused.  Stapling was reinforced with interrupted silk sutures.  The mesenteric defect was also closed with silk sutures.  We then returned the anastomosis to the abdominal cavity.  The abdomen was irrigated with a liter of normal saline.  Hemostasis appeared to be achieved.  Drapes, gown, and gloves were changed.  I then closed the patient's midline fascia with a #1 PDS suture.  All  incisions were anesthetized Marcaine.  All incisions were then closed with 3-0 Vicryl and 4-0 Monocryl sutures.  Dermabond and occlusive dressing was applied.  Patient tolerated the procedure well.  All the counts were correct at the end of the procedure.  The patient was then extubated in the operating room and taken in stable addition to the recovery room.          Coralie Keens   Date: 12/22/2020  Time: 10:34 AM

## 2020-12-22 NOTE — Transfer of Care (Signed)
Immediate Anesthesia Transfer of Care Note  Patient: Alexandria French  Procedure(s) Performed: LAPAROSCOPIC ASSISTED PARTIAL COLECTOMY  Patient Location: PACU  Anesthesia Type:General  Level of Consciousness: oriented, drowsy and patient cooperative  Airway & Oxygen Therapy: Patient Spontanous Breathing and Patient connected to nasal cannula oxygen  Post-op Assessment: Report given to RN and Post -op Vital signs reviewed and stable  Post vital signs: Reviewed  Last Vitals:  Vitals Value Taken Time  BP 142/61 12/22/20 1056  Temp    Pulse 71 12/22/20 1101  Resp 17 12/22/20 1101  SpO2 99 % 12/22/20 1101  Vitals shown include unvalidated device data.  Last Pain:  Vitals:   12/22/20 0834  TempSrc:   PainSc: 0-No pain      Patients Stated Pain Goal: 0 (25/18/98 4210)  Complications: No notable events documented.

## 2020-12-23 ENCOUNTER — Encounter (HOSPITAL_COMMUNITY): Payer: Self-pay | Admitting: Surgery

## 2020-12-23 DIAGNOSIS — R195 Other fecal abnormalities: Secondary | ICD-10-CM | POA: Diagnosis not present

## 2020-12-23 DIAGNOSIS — I9589 Other hypotension: Secondary | ICD-10-CM | POA: Diagnosis not present

## 2020-12-23 DIAGNOSIS — I214 Non-ST elevation (NSTEMI) myocardial infarction: Secondary | ICD-10-CM | POA: Diagnosis not present

## 2020-12-23 DIAGNOSIS — I9581 Postprocedural hypotension: Secondary | ICD-10-CM | POA: Diagnosis not present

## 2020-12-23 DIAGNOSIS — I421 Obstructive hypertrophic cardiomyopathy: Secondary | ICD-10-CM | POA: Diagnosis not present

## 2020-12-23 LAB — CBC
HCT: 27.5 % — ABNORMAL LOW (ref 36.0–46.0)
Hemoglobin: 8.7 g/dL — ABNORMAL LOW (ref 12.0–15.0)
MCH: 31.3 pg (ref 26.0–34.0)
MCHC: 31.6 g/dL (ref 30.0–36.0)
MCV: 98.9 fL (ref 80.0–100.0)
Platelets: 255 10*3/uL (ref 150–400)
RBC: 2.78 MIL/uL — ABNORMAL LOW (ref 3.87–5.11)
RDW: 14 % (ref 11.5–15.5)
WBC: 12.9 10*3/uL — ABNORMAL HIGH (ref 4.0–10.5)
nRBC: 0 % (ref 0.0–0.2)

## 2020-12-23 LAB — BASIC METABOLIC PANEL
Anion gap: 7 (ref 5–15)
BUN: 8 mg/dL (ref 8–23)
CO2: 26 mmol/L (ref 22–32)
Calcium: 9.5 mg/dL (ref 8.9–10.3)
Chloride: 102 mmol/L (ref 98–111)
Creatinine, Ser: 0.86 mg/dL (ref 0.44–1.00)
GFR, Estimated: >60 mL/min (ref >60)
Glucose, Bld: 128 mg/dL — ABNORMAL HIGH (ref 70–99)
Potassium: 4.5 mmol/L (ref 3.5–5.1)
Sodium: 135 mmol/L (ref 135–145)

## 2020-12-23 MED ORDER — MIDODRINE HCL 5 MG PO TABS
10.0000 mg | ORAL_TABLET | Freq: Three times a day (TID) | ORAL | Status: DC
  Administered 2020-12-23 – 2020-12-24 (×4): 10 mg via ORAL
  Filled 2020-12-23 (×6): qty 2

## 2020-12-23 NOTE — Progress Notes (Signed)
[facility] PROGRESS NOTE    Progress Note  Alexandria French  ZLD:[id number] DOB: [birth year] DOA: 12/14/2020 PCP: [place] Nation, MD     Brief Narrative:   Alexandria French is an 70 y.o. female past medical history significant for hypertrophic obstructive cardiomyopathy, paroxysmal atrial fibrillation on Eliquis, sick sinus syndrome with a pacemaker presents with increased shortness of breath and a near syncopal episode   Significant studies: Cardiac cath 12/19/2020 showed nonobstructive disease moderate reduced cardiac output.  Antibiotics: None  Microbiology data: Blood culture:  Procedures: 12/20/2020 colonoscopy that showed malignant tumor in the cecum biopsies were done severe diverticulosis in the entirety of the colon 12/23/2019 laparoscopic ileocolectomy   Assessment/Plan:   Normocytic anemia/iron deficiency anemia/acute blood loss anemia secondary to adenocarcinoma of the right colon Patient is status post laparoscopic ileocolectomy intervention on 12/22/2020. Appreciate surgery's assistance. Foley discontinued, on a clear liquid diet. CT scan of the chest abdomen and pelvis does not show metastatic disease. Surgery to dictate when to start Eliquis. Out of bed to chair continue to work with physical therapy.  Acute on chronic diastolic heart failure Secondary to [facility]. She was diuresed and her creatinine trended up diuretics were held Creatinine has improved, Continue on metoprolol. Cardiac cath showed moderately reduced cardiac output to start on low-dose midodrine. Cardiology relate that his symptoms are probably due to his anemia. She is currently 59 kg.  Neurogenic orthostatic hypotension : Have improved with midodrine. Continue compression stockings.  Elevated troponins: Cardiac cath showed nonobstructive disease.  Mild hypokalemia: Continues to be low recheck tomorrow morning.  Paroxysmal atrial fibrillation: Holding Eliquis now in sinus rhythm.   Continue metoprolol.  Injury: Likely due to overdiuresis diuretics were held creatinine has returned to baseline.   DVT prophylaxis: SCDs Family Communication:none Status is: Inpatient  Remains inpatient appropriate because:Hemodynamically unstable  Dispo: The patient is from: Home              Anticipated d/c is to: Home              Patient currently is not medically stable to d/c.   Difficult to place patient No     Code Status:     Code Status Orders  (From admission, onward)           Start     Ordered   12/15/20 0414  Full code  Continuous        12/15/20 0415           Code Status History     Date Active Date Inactive Code Status Order ID Comments User Context   02/28/2020 1519 02/28/2020 2157 Full Code [id number]  Croitoru, Dani Gobble, MD Inpatient      Advance Directive Documentation    Flowsheet Row Most Recent Value  Type of Advance Directive Healthcare Power of [place], Living will  Pre-existing out of facility DNR order (yellow form or pink MOST form) --  "MOST" Form in Place? --         IV Access:   Peripheral IV   Procedures and diagnostic studies:   CT ABDOMEN PELVIS W CONTRAST  Result Date: 12/22/2020 CLINICAL DATA:  Probable abdominal mass. EXAM: CT ABDOMEN AND PELVIS WITH CONTRAST TECHNIQUE: Multidetector CT imaging of the abdomen and pelvis was performed using the standard protocol following bolus administration of intravenous contrast. CONTRAST:  18mL OMNIPAQUE IOHEXOL 350 MG/ML SOLN COMPARISON:  None. FINDINGS: Lower chest: Trace bilateral pleural effusions with minimal bibasilar atelectasis. Cardiac pacemaker wires noted. No intra-abdominal free  air or free fluid. Hepatobiliary: Focal area of calcification of the posterior aspect of the inferior right liver capsule likely sequela of prior insult. The liver is otherwise unremarkable. No intrahepatic biliary ductal dilatation. The gallbladder is unremarkable. Pancreas: Unremarkable.  No pancreatic ductal dilatation or surrounding inflammatory changes. Spleen: Normal in size without focal abnormality. Adrenals/Urinary Tract: The adrenal glands unremarkable. There is a 15 mm left renal upper pole cyst. There is no hydronephrosis on either side. The visualized ureters and urinary bladder appear unremarkable. Stomach/Bowel: There is colonic diverticulosis without active inflammatory changes. Small hiatal hernia. There is no bowel obstruction or active inflammation. The appendix is normal. Vascular/Lymphatic: Moderate aortoiliac atherosclerotic disease. The IVC is unremarkable. No portal venous gas. There is no adenopathy. Reproductive: Hysterectomy.  No adnexal masses. Other: None Musculoskeletal: Degenerative changes of the spine. No acute osseous pathology. IMPRESSION: 1. No acute intra-abdominal or pelvic pathology. 2. Colonic diverticulosis. No bowel obstruction. Normal appendix. 3. Trace bilateral pleural effusions. 4. Aortic Atherosclerosis (ICD10-I70.0). Electronically Signed   By: Anner Crete M.D.   On: 12/22/2020 01:54     Medical Consultants:   None.   Subjective:    [name] relates her pain is controlled.  Objective:    Vitals:   12/22/20 2138 12/23/20 0038 12/23/20 0501 12/23/20 0836  BP: 106/63 (!) 102/56 (!) 114/58 (!) 111/43  Pulse: 67 64 64 70  Resp:  18 18   Temp:  98.4 F (36.9 C) 98.3 F (36.8 C)   TempSrc:  Oral Oral   SpO2:  93% 97%   Weight:   58.2 kg   Height:       SpO2: 97 % O2 Flow Rate (L/min): 1 L/min   Intake/Output Summary (Last 24 hours) at 12/23/2020 1001 Last data filed at 12/23/2020 0500 Gross per 24 hour  Intake 200 ml  Output 1200 ml  Net -1000 ml    Filed Weights   12/22/20 0504 12/22/20 0824 12/23/20 0501  Weight: 60 kg 60 kg 58.2 kg    Exam: General exam: In no acute distress. Respiratory system: Good air movement and clear to auscultation. Cardiovascular system: S1 & S2 heard, RRR. No  JVD. Gastrointestinal system: Abdomen is nondistended, soft and nontender.  Extremities: No pedal edema. Skin: No rashes, lesions or ulcers Psychiatry: Judgement and insight appear normal. Mood & affect appropriate.   Data Reviewed:    Labs: Basic Metabolic Panel: Recent Labs  Lab 12/18/20 0348 12/19/20 0215 12/20/20 0217 12/21/20 0201 12/22/20 0040 12/23/20 0215  NA 135 136 138 134* 132* 135  K 3.6 3.4* 3.6 3.4* 3.4* 4.5  CL 97* 96* 97* 96* 97* 102  CO2 28 29 23 27 27 26   GLUCOSE 99 101* 110* 84 87 128*  BUN 15 18 16 11  7* 8  CREATININE 0.99 0.97 1.29* 0.86 0.92 0.86  CALCIUM 8.5* 9.2 10.4* 10.1 9.6 9.5  MG 2.4 2.3 2.3 2.2 2.0  --     GFR Estimated Creatinine Clearance: 50.2 mL/min (by C-G formula based on SCr of 0.86 mg/dL). Liver Function Tests: No results for input(s): AST, ALT, ALKPHOS, BILITOT, PROT, ALBUMIN in the last 168 hours. No results for input(s): LIPASE, AMYLASE in the last 168 hours. No results for input(s): AMMONIA in the last 168 hours. Coagulation profile No results for input(s): INR, PROTIME in the last 168 hours.  COVID-19 Labs  No results for input(s): DDIMER, FERRITIN, LDH, CRP in the last 72 hours.   Lab Results  Component Value Date  [place] NEGATIVE 12/14/2020   [place] NEGATIVE 02/25/2020    CBC: Recent Labs  Lab 12/19/20 0215 12/20/20 0449 12/21/20 0201 12/22/20 0040 12/23/20 0215  WBC 5.1 7.1 4.8 5.3 12.9*  HGB 8.1* 9.5* 9.2* 8.2* 8.7*  HCT 25.9* 29.0* 28.5* 25.0* 27.5*  MCV 98.1 96.0 96.3 95.8 98.9  PLT 251 313 292 256 255    Cardiac Enzymes: No results for input(s): CKTOTAL, CKMB, CKMBINDEX, TROPONINI in the last 168 hours. BNP (last 3 results) No results for input(s): PROBNP in the last 8760 hours. CBG: No results for input(s): GLUCAP in the last 168 hours.  D-Dimer: No results for input(s): DDIMER in the last 72 hours. Hgb A1c: No results for input(s): HGBA1C in the last 72 hours. Lipid Profile: No  results for input(s): CHOL, HDL, LDLCALC, TRIG, CHOLHDL, LDLDIRECT in the last 72 hours. Thyroid function studies: No results for input(s): TSH, T4TOTAL, T3FREE, THYROIDAB in the last 72 hours.  Invalid input(s): FREET3 Anemia work up: No results for input(s): VITAMINB12, FOLATE, FERRITIN, TIBC, IRON, RETICCTPCT in the last 72 hours.  Sepsis Labs: Recent Labs  Lab 12/17/20 0338 12/18/20 0348 12/20/20 0449 12/21/20 0201 12/22/20 0040 12/23/20 0215  PROCALCITON <0.10  --   --   --   --   --   WBC 10.0   < > 7.1 4.8 5.3 12.9*   < > = values in this interval not displayed.    Microbiology Recent Results (from the past 240 hour(s))  Resp Panel by RT-PCR (Flu A&B, Covid) Nasopharyngeal Swab     Status: None   Collection Time: 12/14/20 11:45 PM   Specimen: Nasopharyngeal Swab; Nasopharyngeal(NP) swabs in vial transport medium  Result Value Ref Range Status   SARS Coronavirus 2 by RT PCR NEGATIVE NEGATIVE Final    Comment: (NOTE) SARS-CoV-2 target nucleic acids are NOT DETECTED.  The SARS-CoV-2 RNA is generally detectable in upper respiratory specimens during the acute phase of infection. The lowest concentration of SARS-CoV-2 viral copies this assay can detect is 138 copies/mL. A negative result does not preclude SARS-Cov-2 infection and should not be used as the sole basis for treatment or other patient management decisions. A negative result may occur with  improper specimen collection/handling, submission of specimen other than nasopharyngeal swab, presence of viral mutation(s) within the areas targeted by this assay, and inadequate number of viral copies(<138 copies/mL). A negative result must be combined with clinical observations, patient history, and epidemiological information. The expected result is Negative.  Fact Sheet for Patients:  EntrepreneurPulse.com.au  Fact Sheet for Healthcare Providers:  IncredibleEmployment.be  This  test is no t yet approved or cleared by the [place] FDA and  has been authorized for detection and/or diagnosis of SARS-CoV-2 by FDA under an Emergency Use Authorization (EUA). This EUA will remain  in effect (meaning this test can be used) for the duration of the COVID-19 declaration under Section 564(b)(1) of the Act, 21 U.S.C.section 360bbb-3(b)(1), unless the authorization is terminated  or revoked sooner.       Influenza A by PCR NEGATIVE NEGATIVE Final   Influenza B by PCR NEGATIVE NEGATIVE Final    Comment: (NOTE) The Xpert Xpress SARS-CoV-2/FLU/RSV plus assay is intended as an aid in the diagnosis of influenza from Nasopharyngeal swab specimens and should not be used as a sole basis for treatment. Nasal washings and aspirates are unacceptable for Xpert Xpress SARS-CoV-2/FLU/RSV testing.  Fact Sheet for Patients: EntrepreneurPulse.com.au  Fact Sheet for Healthcare Providers: IncredibleEmployment.be  This test is  not yet approved or cleared by the [place] and has been authorized for detection and/or diagnosis of SARS-CoV-2 by FDA under an Emergency Use Authorization (EUA). This EUA will remain in effect (meaning this test can be used) for the duration of the COVID-19 declaration under Section 564(b)(1) of the Act, 21 U.S.C. section 360bbb-3(b)(1), unless the authorization is terminated or revoked.  Performed at [facility], [address] [place], [place] [address]   Culture, blood (routine x 2)     Status: None   Collection Time: 12/15/20  9:52 AM   Specimen: BLOOD  Result Value Ref Range Status   Specimen Description BLOOD RIGHT ANTECUBITAL  Final   Special Requests   Final    BOTTLES DRAWN AEROBIC AND ANAEROBIC Blood Culture adequate volume   Culture   Final    NO GROWTH 5 DAYS Performed at [facility], [address] [place], [place] [address]    Report Status 12/20/2020 FINAL  Final  Culture, blood  (routine x 2)     Status: None   Collection Time: 12/15/20 10:03 AM   Specimen: BLOOD LEFT FOREARM  Result Value Ref Range Status   Specimen Description BLOOD LEFT FOREARM  Final   Special Requests   Final    BOTTLES DRAWN AEROBIC AND ANAEROBIC Blood Culture adequate volume   Culture   Final    NO GROWTH 5 DAYS Performed at [facility], [place] [address], [place], [place] [address]    Report Status 12/20/2020 FINAL  Final  MRSA Next Gen by PCR, Nasal     Status: None   Collection Time: 12/15/20  3:00 PM   Specimen: Nasal Mucosa; Nasal Swab  Result Value Ref Range Status   MRSA by PCR Next Gen NOT DETECTED NOT DETECTED Final    Comment: (NOTE) The GeneXpert MRSA Assay (FDA approved for NASAL specimens only), is one component of a comprehensive MRSA colonization surveillance program. It is not intended to diagnose MRSA infection nor to guide or monitor treatment for MRSA infections. Test performance is not FDA approved in patients less than 52 years old. Performed at [facility], [place] [address] [place], [place] [address]   Surgical pcr screen     Status: None   Collection Time: 12/22/20  5:38 AM   Specimen: Nasal Mucosa; Nasal Swab  Result Value Ref Range Status   MRSA, PCR NEGATIVE NEGATIVE Final   Staphylococcus aureus NEGATIVE NEGATIVE Final    Comment: (NOTE) The Xpert SA Assay (FDA approved for NASAL specimens in patients 31 years of age and older), is one component of a comprehensive surveillance program. It is not intended to diagnose infection nor to guide or monitor treatment. Performed at [facility], [place] [address], [place], [place] [address]      Medications:    alvimopan  12 mg Oral BID   vitamin C  500 mg Oral Daily   calcium-vitamin D  1 tablet Oral BID   Chlorhexidine Gluconate Cloth  6 each Topical Daily   enoxaparin (LOVENOX) injection  40 mg Subcutaneous Q24H   hydrocortisone  25 mg Rectal BID   influenza vaccine adjuvanted  0.5  mL Intramuscular Tomorrow-1000   loratadine  10 mg Oral Daily   LORazepam  0.5 mg Oral Q48H   melatonin  3 mg Oral QHS   metoprolol tartrate  50 mg Oral BID   midodrine  5 mg Oral TID WC   pneumococcal 23 valent  vaccine  0.5 mL Intramuscular Tomorrow-1000   potassium chloride  20 mEq Oral BID   simvastatin  20 mg Oral Daily   sodium chloride flush  3 mL Intravenous Q12H   Continuous Infusions:     LOS: 8 days   Charlynne Cousins  [facility]  12/23/2020, 10:01 AM

## 2020-12-23 NOTE — Progress Notes (Signed)
1 Day Post-Op   Subjective/Chief Complaint: Resting comfortably Passing small amounts of flatus No nausea Pain controlled with minimal pain meds   Objective: Vital signs in last 24 hours: Temp:  [97.8 F (36.6 C)-98.7 F (37.1 C)] 98.3 F (36.8 C) (09/24 0501) Pulse Rate:  [7-75] 70 (09/24 0836) Resp:  [13-20] 18 (09/24 0501) BP: (102-142)/(43-86) 111/43 (09/24 0836) SpO2:  [92 %-100 %] 97 % (09/24 0501) Weight:  [58.2 kg] 58.2 kg (09/24 0501) Last BM Date: 12/21/20  Intake/Output from previous day: 09/23 0701 - 09/24 0700 In: 750 [I.V.:450; IV Piggyback:300] Out: 1200 [Urine:1175; Blood:25] Intake/Output this shift: No intake/output data recorded.  General appearance: alert, cooperative, and no distress GI: soft, incisional tenderness Mild ecchymosis around incision  Lab Results:  Recent Labs    12/22/20 0040 12/23/20 0215  WBC 5.3 12.9*  HGB 8.2* 8.7*  HCT 25.0* 27.5*  PLT 256 255   BMET Recent Labs    12/22/20 0040 12/23/20 0215  NA 132* 135  K 3.4* 4.5  CL 97* 102  CO2 27 26  GLUCOSE 87 128*  BUN 7* 8  CREATININE 0.92 0.86  CALCIUM 9.6 9.5   PT/INR No results for input(s): LABPROT, INR in the last 72 hours. ABG No results for input(s): PHART, HCO3 in the last 72 hours.  Invalid input(s): PCO2, PO2  Studies/Results: CT ABDOMEN PELVIS W CONTRAST  Result Date: 12/22/2020 CLINICAL DATA:  Probable abdominal mass. EXAM: CT ABDOMEN AND PELVIS WITH CONTRAST TECHNIQUE: Multidetector CT imaging of the abdomen and pelvis was performed using the standard protocol following bolus administration of intravenous contrast. CONTRAST:  51mL OMNIPAQUE IOHEXOL 350 MG/ML SOLN COMPARISON:  None. FINDINGS: Lower chest: Trace bilateral pleural effusions with minimal bibasilar atelectasis. Cardiac pacemaker wires noted. No intra-abdominal free air or free fluid. Hepatobiliary: Focal area of calcification of the posterior aspect of the inferior right liver capsule likely  sequela of prior insult. The liver is otherwise unremarkable. No intrahepatic biliary ductal dilatation. The gallbladder is unremarkable. Pancreas: Unremarkable. No pancreatic ductal dilatation or surrounding inflammatory changes. Spleen: Normal in size without focal abnormality. Adrenals/Urinary Tract: The adrenal glands unremarkable. There is a 15 mm left renal upper pole cyst. There is no hydronephrosis on either side. The visualized ureters and urinary bladder appear unremarkable. Stomach/Bowel: There is colonic diverticulosis without active inflammatory changes. Small hiatal hernia. There is no bowel obstruction or active inflammation. The appendix is normal. Vascular/Lymphatic: Moderate aortoiliac atherosclerotic disease. The IVC is unremarkable. No portal venous gas. There is no adenopathy. Reproductive: Hysterectomy.  No adnexal masses. Other: None Musculoskeletal: Degenerative changes of the spine. No acute osseous pathology. IMPRESSION: 1. No acute intra-abdominal or pelvic pathology. 2. Colonic diverticulosis. No bowel obstruction. Normal appendix. 3. Trace bilateral pleural effusions. 4. Aortic Atherosclerosis (ICD10-I70.0). Electronically Signed   By: Anner Crete M.D.   On: 12/22/2020 01:54    Anti-infectives: Anti-infectives (From admission, onward)    Start     Dose/Rate Route Frequency Ordered Stop   12/22/20 0845  ciprofloxacin (CIPRO) IVPB 400 mg        400 mg 200 mL/hr over 60 Minutes Intravenous NOW 12/22/20 0832 12/22/20 1016   12/22/20 0845  metroNIDAZOLE (FLAGYL) IVPB 500 mg        500 mg 100 mL/hr over 60 Minutes Intravenous NOW 12/22/20 0832 12/22/20 1004   12/22/20 0834  metroNIDAZOLE (FLAGYL) 500 MG/100ML IVPB       Note to Pharmacy: Jeralene Huff   : cabinet override  12/22/20 0834 12/22/20 0945   12/22/20 0834  ciprofloxacin (CIPRO) 400 MG/200ML IVPB       Note to Pharmacy: Jeralene Huff   : cabinet override      12/22/20 0834 12/22/20 0944   12/21/20 1130   ciprofloxacin (CIPRO) IVPB 400 mg  Status:  Discontinued        400 mg 200 mL/hr over 60 Minutes Intravenous On call to O.R. 12/21/20 0947 12/22/20 0559   12/21/20 1130  metroNIDAZOLE (FLAGYL) IVPB 500 mg  Status:  Discontinued        500 mg 100 mL/hr over 60 Minutes Intravenous On call to O.R. 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 Ninfa Linden 12/22/20  Awaiting full return of bowel function - on Entereg Tolerating clears D/C foley today Encourage ambulation Abdominal binder   LOS: 8 days    Maia Petties 12/23/2020

## 2020-12-23 NOTE — TOC Initial Note (Signed)
Transition of Care [facility]) - Initial/Assessment Note    Patient Details  Name: Alexandria French MRN: [id number] Date of Birth: 03/24/1951  Transition of [facility]) CM/SW Contact:    Pollie Friar, RN Phone Number: 12/23/2020, 10:19 AM  Clinical Narrative:                 Patient lives at home with her spouse that is retired and can provide supervision at home. She denies issues with transportation or home medications. [employer] choices provided to patient and she wants to ask family about who to use. TOC will follow.   Expected Discharge Plan: [place] Barriers to Discharge: Continued Medical Work up   Patient Goals and CMS Choice   CMS Medicare.gov Compare Post Acute Care list provided to:: Patient Choice offered to / list presented to : Patient  Expected Discharge Plan and Services Expected Discharge Plan: [place]   Discharge Planning Services: CM Consult Post Acute Care Choice: [place] arrangements for the past 2 months: Single Family Home                                      Prior Living Arrangements/Services Living arrangements for the past 2 months: Single Family Home Lives with:: Spouse Patient language and need for interpreter reviewed:: Yes Do you feel safe going back to the place where you live?: Yes        Care giver support system in place?: Yes (comment) Current home services: DME (walker/ 3 in 1) Criminal Activity/Legal Involvement Pertinent to Current Situation/Hospitalization: No - Comment as needed  Activities of Daily Living Home Assistive Devices/Equipment: None ADL Screening (condition at time of admission) Patient's cognitive ability adequate to safely complete daily activities?: Yes Is the patient deaf or have difficulty hearing?: Yes (impaired hearing L ear) Does the patient have difficulty seeing, even when wearing glasses/contacts?: No Does the patient have difficulty concentrating,  remembering, or making decisions?: Yes Patient able to express need for assistance with ADLs?: No Does the patient have difficulty dressing or bathing?: No Independently performs ADLs?: Yes (appropriate for developmental age) Does the patient have difficulty walking or climbing stairs?: Yes Weakness of Legs: None Weakness of Arms/Hands: None  Permission Sought/Granted                  Emotional Assessment Appearance:: Appears stated age Attitude/Demeanor/Rapport: Engaged Affect (typically observed): Accepting Orientation: : Oriented to Self, Oriented to Place, Oriented to  Time, Oriented to Situation   Psych Involvement: No (comment)  Admission diagnosis:  NSTEMI (non-ST elevated myocardial infarction) [facility]) [I21.4] Patient Active Problem List   Diagnosis Date Noted   Absolute anemia    Heme positive stool    Hemorrhoids with complication    Respiratory failure ([place]) 12/16/2020   HOCM (hypertrophic obstructive cardiomyopathy) ([place]) 12/15/2020   Hypotension 12/15/2020   NSTEMI (non-ST elevated myocardial infarction) ([place]) 12/14/2020   SSS (sick sinus syndrome) ([place]) 02/28/2020   Encounter for screening colonoscopy 08/13/2019   Palpitations 06/07/2014   HTN (hypertension) 01/18/2014   Chest pain 08/13/2013   LBBB (left bundle branch block) 08/13/2013   PCP:  [place] Nation, MD Pharmacy:   [id number] - [place], [place] [address] [place] [address] Phone: [phone] Fax: [phone]  [facility] [phone] - [place], [place] -  [place] [address] Phone: [phone] Fax: [phone]     Social Determinants of Health (SDOH) Interventions    Readmission Risk Interventions No flowsheet data found.

## 2020-12-23 NOTE — Progress Notes (Signed)
Physical Therapy Treatment Patient Details Name: Alexandria French MRN: [id number] DOB: [birth year] Today's Date: 12/23/2020   History of Present Illness Pt is a 70 y.o. female who presented to the ED at [facility] on 12/14/20 with new-onset palpitations, SOB, and dizziness. Pt found to be hypotensive and admitted with acute hypoxic respiratory failure secondary to decompensated diastolic heart failure. S/p R/L heart cath and coronary angiography 9/16. PMH: hypertrophic obstructive cardiomyopathy, paroxysmal atrial fibrillation on Eliquis, sick sinus syndrome status post pacemaker, hypertension, nonobstructive CAD, diverticulosis    PT Comments    The pt presents with good ability to complete bed mobility and sit-stand transfers when given single UE support at this time. She continued to have slight drop in BP with initial stand, but was able to recover and reports resolution of sx with continued activity. Pt given RW for improved stability this session, she was able to utilize safely with no LOB or need for physical assist. Will continue to benefit from skilled PT to progress to full independence, but is safe to return home with family when medically stable.   VITALS:  - supine in bed - BP: 116/54 (72); - sitting EOB - BP: 119/49 (71); - standing - BP: 86/54 (64);  - sitting after ambulation - BP: 118/55 (70)      Recommendations for follow up therapy are one component of a multi-disciplinary discharge planning process, led by the attending physician.  Recommendations may be updated based on patient status, additional functional criteria and insurance authorization.  Follow Up Recommendations  Home health PT;Supervision for mobility/OOB     Equipment Recommendations  None recommended by PT    Recommendations for Other Services       Precautions / Restrictions Precautions Precautions: Fall Precaution Comments: monitor BP/vitals Restrictions Weight Bearing Restrictions: No     Mobility   Bed Mobility Overal bed mobility: Modified Independent             General bed mobility comments: no assist needed,slightly increased time    Transfers Overall transfer level: Needs assistance Equipment used: None Transfers: Sit to/from Stand Sit to Stand: Supervision         General transfer comment: supervision for safety, pt reaching for single UE support  Ambulation/Gait Ambulation/Gait assistance: Supervision Gait Distance (Feet): 150 Feet Assistive device: Rolling walker (2 wheeled) Gait Pattern/deviations: Step-through pattern;Decreased stride length;Narrow base of support;WFL(Within Functional Limits) Gait velocity: 0.2 m/s Gait velocity interpretation: <1.31 ft/sec, indicative of household ambulator General Gait Details: slow, steady gait       Balance Overall balance assessment: Mild deficits observed, not formally tested                                          Cognition Arousal/Alertness: Awake/alert Behavior During Therapy: WFL for tasks assessed/performed Overall Cognitive Status: Within Functional Limits for tasks assessed                                 General Comments: pt reports concern about passing out yseterday      Exercises      General Comments General comments (skin integrity, edema, etc.): BP 116/54 (72) in supine; 119/49 (71) in sitting EOB; 86/54 (64) in standing; but 118/55 (70) after ambulating      Pertinent Vitals/Pain Pain Assessment: No/denies pain     PT Goals (  current goals can now be found in the care plan section) Acute Rehab PT Goals Patient Stated Goal: to get better PT Goal Formulation: With patient Time For Goal Achievement: 01/06/21 Potential to Achieve Goals: Good Progress towards PT goals: Progressing toward goals    Frequency    Min 3X/week      PT Plan Current plan remains appropriate       AM-PAC PT "6 Clicks" Mobility   Outcome Measure  Help needed turning  from your back to your side while in a flat bed without using bedrails?: None Help needed moving from lying on your back to sitting on the side of a flat bed without using bedrails?: None Help needed moving to and from a bed to a chair (including a wheelchair)?: None Help needed standing up from a chair using your arms (e.g., wheelchair or bedside chair)?: A Little Help needed to walk in hospital room?: A Little Help needed climbing 3-5 steps with a railing? : A Little 6 Click Score: 21    End of Session Equipment Utilized During Treatment: Gait belt Activity Tolerance: Patient tolerated treatment well Patient left: in chair;with call bell/phone within reach Nurse Communication: Mobility status PT Visit Diagnosis: Unsteadiness on feet (R26.81);Other abnormalities of gait and mobility (R26.89);Difficulty in walking, not elsewhere classified (R26.2);Dizziness and giddiness (R42)     Time: 2263-3354 PT Time Calculation (min) (ACUTE ONLY): 28 min  Charges:  $Gait Training: 8-22 mins $Therapeutic Activity: 8-22 mins                     West Carbo, PT, DPT   Acute Rehabilitation Department Pager #: [phone]   Sandra Cockayne 12/23/2020, 6:17 PM

## 2020-12-23 NOTE — Progress Notes (Signed)
Cardiology Progress Note  [name] MRN: [id number] DOB: 19-Mar-1951 Date of Encounter: 12/23/2020  Primary Cardiologist: Sanda Klein, MD  Subjective   Husband and patient very concerned about postural episode sitting in chair yesterday with near black out   ROS:  All other ROS reviewed and negative. Pertinent positives noted in the HPI.     Inpatient Medications  Scheduled Meds:  alvimopan  12 mg Oral BID   vitamin C  500 mg Oral Daily   calcium-vitamin D  1 tablet Oral BID   Chlorhexidine Gluconate Cloth  6 each Topical Daily   enoxaparin (LOVENOX) injection  40 mg Subcutaneous Q24H   hydrocortisone  25 mg Rectal BID   influenza vaccine adjuvanted  0.5 mL Intramuscular Tomorrow-1000   loratadine  10 mg Oral Daily   LORazepam  0.5 mg Oral Q48H   melatonin  3 mg Oral QHS   metoprolol tartrate  50 mg Oral BID   midodrine  5 mg Oral TID WC   pneumococcal 23 valent vaccine  0.5 mL Intramuscular Tomorrow-1000   potassium chloride  20 mEq Oral BID   simvastatin  20 mg Oral Daily   sodium chloride flush  3 mL Intravenous Q12H   Continuous Infusions:   PRN Meds: acetaminophen, morphine injection, ondansetron (ZOFRAN) IV, oxyCODONE, sodium chloride flush, zolpidem   Vital Signs   Vitals:   12/23/20 0038 12/23/20 0501 12/23/20 0836 12/23/20 1021  BP: (!) 102/56 (!) 114/58 (!) 111/43 125/61  Pulse: 64 64 70 61  Resp: 18 18    Temp: 98.4 F (36.9 C) 98.3 F (36.8 C)    TempSrc: Oral Oral    SpO2: 93% 97%    Weight:  58.2 kg    Height:        Intake/Output Summary (Last 24 hours) at 12/23/2020 1139 Last data filed at 12/23/2020 0500 Gross per 24 hour  Intake --  Output 1150 ml  Net -1150 ml   Last 3 Weights 12/23/2020 12/22/2020 12/22/2020  Weight (lbs) 128 lb 4.9 oz 132 lb 4.4 oz 132 lb 4.4 oz  Weight (kg) 58.2 kg 60 kg 60 kg      Telemetry  Overnight telemetry shows sinus rhythm heart rate in the 70s, intermittent V pacing, which I personally  reviewed.   Physical Exam   Vitals:   12/23/20 0038 12/23/20 0501 12/23/20 0836 12/23/20 1021  BP: (!) 102/56 (!) 114/58 (!) 111/43 125/61  Pulse: 64 64 70 61  Resp: 18 18    Temp: 98.4 F (36.9 C) 98.3 F (36.8 C)    TempSrc: Oral Oral    SpO2: 93% 97%    Weight:  58.2 kg    Height:        Intake/Output Summary (Last 24 hours) at 12/23/2020 1139 Last data filed at 12/23/2020 0500 Gross per 24 hour  Intake --  Output 1150 ml  Net -1150 ml    Last 3 Weights 12/23/2020 12/22/2020 12/22/2020  Weight (lbs) 128 lb 4.9 oz 132 lb 4.4 oz 132 lb 4.4 oz  Weight (kg) 58.2 kg 60 kg 60 kg    Body mass index is 23.47 kg/m.   Pale Elderly female  SEM worse with valsalva PPM under left clavicle No edema Abdominal wound A healing well no tympany soft  No edema  Labs  High Sensitivity Troponin:   Recent Labs  Lab 12/14/20 2130 12/14/20 2345 12/15/20 0618 12/15/20 0817  TROPONINIHS 593* 502* 601* 652*     Cardiac  EnzymesNo results for input(s): TROPONINI in the last 168 hours. No results for input(s): TROPIPOC in the last 168 hours.  Chemistry Recent Labs  Lab 12/21/20 0201 12/22/20 0040 12/23/20 0215  NA 134* 132* 135  K 3.4* 3.4* 4.5  CL 96* 97* 102  CO2 27 27 26   GLUCOSE 84 87 128*  BUN 11 7* 8  CREATININE 0.86 0.92 0.86  CALCIUM 10.1 9.6 9.5  GFRNONAA >60 >60 >60  ANIONGAP 11 8 7     Hematology Recent Labs  Lab 12/21/20 0201 12/22/20 0040 12/23/20 0215  WBC 4.8 5.3 12.9*  RBC 2.96* 2.61* 2.78*  HGB 9.2* 8.2* 8.7*  HCT 28.5* 25.0* 27.5*  MCV 96.3 95.8 98.9  MCH 31.1 31.4 31.3  MCHC 32.3 32.8 31.6  RDW 13.4 13.4 14.0  PLT 292 256 255   BNPNo results for input(s): BNP, PROBNP in the last 168 hours.  DDimer No results for input(s): DDIMER in the last 168 hours.   Radiology  CT ABDOMEN PELVIS W CONTRAST  Result Date: 12/22/2020 CLINICAL DATA:  Probable abdominal mass. EXAM: CT ABDOMEN AND PELVIS WITH CONTRAST TECHNIQUE: Multidetector CT imaging of the  abdomen and pelvis was performed using the standard protocol following bolus administration of intravenous contrast. CONTRAST:  31mL OMNIPAQUE IOHEXOL 350 MG/ML SOLN COMPARISON:  None. FINDINGS: Lower chest: Trace bilateral pleural effusions with minimal bibasilar atelectasis. Cardiac pacemaker wires noted. No intra-abdominal free air or free fluid. Hepatobiliary: Focal area of calcification of the posterior aspect of the inferior right liver capsule likely sequela of prior insult. The liver is otherwise unremarkable. No intrahepatic biliary ductal dilatation. The gallbladder is unremarkable. Pancreas: Unremarkable. No pancreatic ductal dilatation or surrounding inflammatory changes. Spleen: Normal in size without focal abnormality. Adrenals/Urinary Tract: The adrenal glands unremarkable. There is a 15 mm left renal upper pole cyst. There is no hydronephrosis on either side. The visualized ureters and urinary bladder appear unremarkable. Stomach/Bowel: There is colonic diverticulosis without active inflammatory changes. Small hiatal hernia. There is no bowel obstruction or active inflammation. The appendix is normal. Vascular/Lymphatic: Moderate aortoiliac atherosclerotic disease. The IVC is unremarkable. No portal venous gas. There is no adenopathy. Reproductive: Hysterectomy.  No adnexal masses. Other: None Musculoskeletal: Degenerative changes of the spine. No acute osseous pathology. IMPRESSION: 1. No acute intra-abdominal or pelvic pathology. 2. Colonic diverticulosis. No bowel obstruction. Normal appendix. 3. Trace bilateral pleural effusions. 4. Aortic Atherosclerosis (ICD10-I70.0). Electronically Signed   By: Anner Crete M.D.   On: 12/22/2020 01:54    Cardiac Studies  LHC 12/15/2020 SUMMARY Angiographically Normal Coronary arteries with a left dominant system. Significant LVOT gradient with apical LV pressures of 184/12 mmHg with an EDP of 25, mid LV cavity 157/9 mmHg with EDP of 25 and outflow  tract pressure of 111/9 mmHg with a stable EDP of 24 mmHg. Mild Secondary Pulmonary Hypertension with mean PAP of 29 mmHg and PCWP of 29 mmHg. Moderately reduced cardiac function with a cardiac output-index of 3.26 and 2.02 by Fick and 3.55-2.2 by thermodilution.  TTE 12/15/2020  1. Left ventricular ejection fraction, by estimation, is 55-60%. The left  ventricle demonstrates regional wall motion abnormalities that are  consistent with a left bundle branch block.There is severe asymmetric left  ventricular hypertrophy. Left  ventricular diastolic parameters are consistent with Grade I diastolic  dysfunction (impaired relaxation). No significant LVOT Obstructive seen in  this study.   2. Right ventricular systolic function is normal. The right ventricular  size is normal. There is normal pulmonary  artery systolic pressure.   3. The mitral valve is normal in structure. Mild mitral valve  regurgitation. No evidence of mitral stenosis.   4. The aortic valve was not well visualized. Aortic valve regurgitation  is trivial. No aortic stenosis is present.   5. The inferior vena cava is normal in size with <50% respiratory  variability, suggesting right atrial pressure of 8 mmHg.   6. Technically difficult acquisition; in future studies consider use of  echo-contrast.   Patient Profile  Alexandria French is a 70 y.o. female with hypertrophic obstructive cardiomyopathy, paroxysmal atrial fibrillation on Eliquis, sick sinus syndrome status post pacemaker, hypertension, nonobstructive CAD who was admitted on 12/15/2020 with dizziness acute hypoxic respiratory failure secondary to decompensated diastolic heart failure.  She underwent left and right heart catheterization which showed volume overload and no evidence of CAD.  She was compensated based on pulmonary venous saturation.  Course has been complicated by anemia and she has now been found to have a colonic cancer post resection on 12/22/20   Assessment &  Plan   #Hypertrophic cardiomyopathy -  She had a peak to peak gradient of 70 mmHg on cath.  This is really unchanged from prior testing.  Continue beta blocker limited Rx due to orthostatic hypotension She has PPM and native ECG with LBBB anyway so AV pacing would not be helpful in regard to gradient    #Neurogenic orthostatic hypotension -Increase midodrine to 10 mg tid Discussed slow positional changes and elevating legs if it recurs especially at home   #Colon cancer - post cecal mass resection 12/22/20 not requiring colostomy Hct low but stable 27.5 Needs to have BM taking PO Not clear of plan/need for adjuvant chemo  #Paroxysmal A. Fib -Maintaining sinus rhythm.  Holding Eliquis. -She is on a beta-blocker for her hypertrophic cardiomyopathy. -Restart Eliquis after surgery at the discretion of surgery. - would not be in a rush since she is in sinus   #AKI, resolved -Secondary to overdiuresis.  Creatinine has normalized.  #Non-STEMI, ruled out -Troponin was elevated in the setting of hypotension hypertrophic cardiomyopathy.  Normal coronary arteries. -Aspirin and statin were stopped.  For questions or updates, please contact [place] Please consult [email or web address] for contact info under   Jenkins Rouge MD [facility]

## 2020-12-24 DIAGNOSIS — I214 Non-ST elevation (NSTEMI) myocardial infarction: Secondary | ICD-10-CM | POA: Diagnosis not present

## 2020-12-24 DIAGNOSIS — I951 Orthostatic hypotension: Secondary | ICD-10-CM | POA: Diagnosis not present

## 2020-12-24 DIAGNOSIS — I421 Obstructive hypertrophic cardiomyopathy: Secondary | ICD-10-CM | POA: Diagnosis not present

## 2020-12-24 LAB — CBC
HCT: 26.6 % — ABNORMAL LOW (ref 36.0–46.0)
Hemoglobin: 8.2 g/dL — ABNORMAL LOW (ref 12.0–15.0)
MCH: 30.8 pg (ref 26.0–34.0)
MCHC: 30.8 g/dL (ref 30.0–36.0)
MCV: 100 fL (ref 80.0–100.0)
Platelets: 263 10*3/uL (ref 150–400)
RBC: 2.66 MIL/uL — ABNORMAL LOW (ref 3.87–5.11)
RDW: 14.4 % (ref 11.5–15.5)
WBC: 9.2 10*3/uL (ref 4.0–10.5)
nRBC: 0 % (ref 0.0–0.2)

## 2020-12-24 NOTE — Progress Notes (Signed)
Cardiology Progress Note  [name] MRN: [id number] DOB: December 16, 1950 Date of Encounter: 12/24/2020  Primary Cardiologist: Sanda Klein, MD  Subjective   No postural episodes yesterday afternoon IV in right hand needs to be re done   ROS:  All other ROS reviewed and negative. Pertinent positives noted in the HPI.     Inpatient Medications  Scheduled Meds:  alvimopan  12 mg Oral BID   vitamin C  500 mg Oral Daily   calcium-vitamin D  1 tablet Oral BID   enoxaparin (LOVENOX) injection  40 mg Subcutaneous Q24H   hydrocortisone  25 mg Rectal BID   influenza vaccine adjuvanted  0.5 mL Intramuscular Tomorrow-1000   loratadine  10 mg Oral Daily   LORazepam  0.5 mg Oral Q48H   melatonin  3 mg Oral QHS   metoprolol tartrate  50 mg Oral BID   midodrine  10 mg Oral TID WC   pneumococcal 23 valent vaccine  0.5 mL Intramuscular Tomorrow-1000   potassium chloride  20 mEq Oral BID   simvastatin  20 mg Oral Daily   sodium chloride flush  3 mL Intravenous Q12H   Continuous Infusions:   PRN Meds: acetaminophen, morphine injection, ondansetron (ZOFRAN) IV, oxyCODONE, sodium chloride flush, zolpidem   Vital Signs   Vitals:   12/23/20 1021 12/23/20 1419 12/23/20 2150 12/24/20 0647  BP: 125/61 (!) 120/49 (!) 112/58   Pulse: 61 65 71   Resp:  18  18  Temp:  98 F (36.7 C) (!) 97.5 F (36.4 C) 98.3 F (36.8 C)  TempSrc:  Oral Oral Oral  SpO2:  98% 97% 98%  Weight:      Height:        Intake/Output Summary (Last 24 hours) at 12/24/2020 0818 Last data filed at 12/23/2020 1300 Gross per 24 hour  Intake 286 ml  Output 650 ml  Net -364 ml   Last 3 Weights 12/23/2020 12/22/2020 12/22/2020  Weight (lbs) 128 lb 4.9 oz 132 lb 4.4 oz 132 lb 4.4 oz  Weight (kg) 58.2 kg 60 kg 60 kg      Telemetry  Overnight telemetry shows sinus rhythm heart rate in the 70s, intermittent V pacing, which I personally reviewed.   Physical Exam   Vitals:   12/23/20 1021 12/23/20 1419  12/23/20 2150 12/24/20 0647  BP: 125/61 (!) 120/49 (!) 112/58   Pulse: 61 65 71   Resp:  18  18  Temp:  98 F (36.7 C) (!) 97.5 F (36.4 C) 98.3 F (36.8 C)  TempSrc:  Oral Oral Oral  SpO2:  98% 97% 98%  Weight:      Height:        Intake/Output Summary (Last 24 hours) at 12/24/2020 0818 Last data filed at 12/23/2020 1300 Gross per 24 hour  Intake 286 ml  Output 650 ml  Net -364 ml    Last 3 Weights 12/23/2020 12/22/2020 12/22/2020  Weight (lbs) 128 lb 4.9 oz 132 lb 4.4 oz 132 lb 4.4 oz  Weight (kg) 58.2 kg 60 kg 60 kg    Body mass index is 23.47 kg/m.   Pale Elderly female  SEM worse with valsalva PPM under left clavicle No edema Abdominal wound A healing well no tympany soft  No edema  Labs  High Sensitivity Troponin:   Recent Labs  Lab 12/14/20 2130 12/14/20 2345 12/15/20 0618 12/15/20 0817  TROPONINIHS 593* 502* 601* 652*     Cardiac EnzymesNo results for input(s): TROPONINI in  the last 168 hours. No results for input(s): TROPIPOC in the last 168 hours.  Chemistry Recent Labs  Lab 12/21/20 0201 12/22/20 0040 12/23/20 0215  NA 134* 132* 135  K 3.4* 3.4* 4.5  CL 96* 97* 102  CO2 27 27 26   GLUCOSE 84 87 128*  BUN 11 7* 8  CREATININE 0.86 0.92 0.86  CALCIUM 10.1 9.6 9.5  GFRNONAA >60 >60 >60  ANIONGAP 11 8 7     Hematology Recent Labs  Lab 12/22/20 0040 12/23/20 0215 12/24/20 0217  WBC 5.3 12.9* 9.2  RBC 2.61* 2.78* 2.66*  HGB 8.2* 8.7* 8.2*  HCT 25.0* 27.5* 26.6*  MCV 95.8 98.9 100.0  MCH 31.4 31.3 30.8  MCHC 32.8 31.6 30.8  RDW 13.4 14.0 14.4  PLT 256 255 263   BNPNo results for input(s): BNP, PROBNP in the last 168 hours.  DDimer No results for input(s): DDIMER in the last 168 hours.   Radiology  No results found.  Cardiac Studies  LHC 12/15/2020 SUMMARY Angiographically Normal Coronary arteries with a left dominant system. Significant LVOT gradient with apical LV pressures of 184/12 mmHg with an EDP of 25, mid LV cavity 157/9 mmHg  with EDP of 25 and outflow tract pressure of 111/9 mmHg with a stable EDP of 24 mmHg. Mild Secondary Pulmonary Hypertension with mean PAP of 29 mmHg and PCWP of 29 mmHg. Moderately reduced cardiac function with a cardiac output-index of 3.26 and 2.02 by Fick and 3.55-2.2 by thermodilution.  TTE 12/15/2020  1. Left ventricular ejection fraction, by estimation, is 55-60%. The left  ventricle demonstrates regional wall motion abnormalities that are  consistent with a left bundle branch block.There is severe asymmetric left  ventricular hypertrophy. Left  ventricular diastolic parameters are consistent with Grade I diastolic  dysfunction (impaired relaxation). No significant LVOT Obstructive seen in  this study.   2. Right ventricular systolic function is normal. The right ventricular  size is normal. There is normal pulmonary artery systolic pressure.   3. The mitral valve is normal in structure. Mild mitral valve  regurgitation. No evidence of mitral stenosis.   4. The aortic valve was not well visualized. Aortic valve regurgitation  is trivial. No aortic stenosis is present.   5. The inferior vena cava is normal in size with <50% respiratory  variability, suggesting right atrial pressure of 8 mmHg.   6. Technically difficult acquisition; in future studies consider use of  echo-contrast.   Patient Profile  Alexandria French is a 70 y.o. female with hypertrophic obstructive cardiomyopathy, paroxysmal atrial fibrillation on Eliquis, sick sinus syndrome status post pacemaker, hypertension, nonobstructive CAD who was admitted on 12/15/2020 with dizziness acute hypoxic respiratory failure secondary to decompensated diastolic heart failure.  She underwent left and right heart catheterization which showed volume overload and no evidence of CAD.  She was compensated based on pulmonary venous saturation.  Course has been complicated by anemia and she has now been found to have a colonic cancer post resection  on 12/22/20   Assessment & Plan   #Hypertrophic cardiomyopathy -  She had a peak to peak gradient of 70 mmHg on cath.  This is really unchanged from prior testing.  Continue beta blocker limited Rx due to orthostatic hypotension She has PPM and native ECG with LBBB anyway so AV pacing would not be helpful in regard to gradient    #Neurogenic orthostatic hypotension -Increased midodrine to 10 mg tid  on 12/23/20 Discussed slow positional changes and elevating legs if it  recurs especially at home   #Colon cancer - post cecal mass resection 12/22/20 not requiring colostomy Hct low but stable 27.5 Needs to have BM taking PO Not clear of plan/need for adjuvant chemo  #Paroxysmal A. Fib -Maintaining sinus rhythm.  Holding Eliquis. -She is on a beta-blocker for her hypertrophic cardiomyopathy. -Restart Eliquis after surgery at the discretion of surgery. - would not be in a rush since she is in sinus   #AKI, resolved -Secondary to overdiuresis.  Creatinine has normalized.  #Non-STEMI, ruled out -Troponin was elevated in the setting of hypotension hypertrophic cardiomyopathy.  Normal coronary arteries. -Aspirin and statin were stopped.  For questions or updates, please contact [place] Please consult [email or web address] for contact info under   Jenkins Rouge MD [facility] [name], female   DOB: 1950/10/19, 70 y.o.   MRN: [id number]

## 2020-12-24 NOTE — Progress Notes (Signed)
2 Days Post-Op   Subjective/Chief Complaint: Patient had a large episode of diarrhea last night Pain well-controlled No nausea   Objective: Vital signs in last 24 hours: Temp:  [97.5 F (36.4 C)-98.3 F (36.8 C)] 98.3 F (36.8 C) (09/25 0647) Pulse Rate:  [61-71] 71 (09/24 2150) Resp:  [18] 18 (09/25 0647) BP: (112-125)/(49-61) 112/58 (09/24 2150) SpO2:  [97 %-98 %] 98 % (09/25 0647) Last BM Date: 12/21/20  Intake/Output from previous day: 09/24 0701 - 09/25 0700 In: 286 [P.O.:286] Out: 650 [Urine:650] Intake/Output this shift: No intake/output data recorded.    General appearance: alert, cooperative, and no distress GI: soft, incisional tenderness Mild ecchymosis around incision  Lab Results:  Recent Labs    12/23/20 0215 12/24/20 0217  WBC 12.9* 9.2  HGB 8.7* 8.2*  HCT 27.5* 26.6*  PLT 255 263   BMET Recent Labs    12/22/20 0040 12/23/20 0215  NA 132* 135  K 3.4* 4.5  CL 97* 102  CO2 27 26  GLUCOSE 87 128*  BUN 7* 8  CREATININE 0.92 0.86  CALCIUM 9.6 9.5   PT/INR No results for input(s): LABPROT, INR in the last 72 hours. ABG No results for input(s): PHART, HCO3 in the last 72 hours.  Invalid input(s): PCO2, PO2  Studies/Results: No results found.  Anti-infectives: Anti-infectives (From admission, onward)    Start     Dose/Rate Route Frequency Ordered Stop   12/22/20 0845  ciprofloxacin (CIPRO) IVPB 400 mg        400 mg 200 mL/hr over 60 Minutes Intravenous NOW 12/22/20 0832 12/22/20 1016   12/22/20 0845  metroNIDAZOLE (FLAGYL) IVPB 500 mg        500 mg 100 mL/hr over 60 Minutes Intravenous NOW 12/22/20 0832 12/22/20 1004   12/22/20 0834  metroNIDAZOLE (FLAGYL) 500 MG/100ML IVPB       Note to Pharmacy: Jeralene Huff   : cabinet override      12/22/20 0834 12/22/20 0945   12/22/20 0834  ciprofloxacin (CIPRO) 400 MG/200ML IVPB       Note to Pharmacy: Jeralene Huff   : cabinet override      12/22/20 0834 12/22/20 0944   12/21/20 1130   ciprofloxacin (CIPRO) IVPB 400 mg  Status:  Discontinued        400 mg 200 mL/hr over 60 Minutes Intravenous On call to O.R. 12/21/20 0947 12/22/20 0559   12/21/20 1130  metroNIDAZOLE (FLAGYL) IVPB 500 mg  Status:  Discontinued        500 mg 100 mL/hr over 60 Minutes Intravenous On call to O.R. 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 Ninfa Linden 12/22/20  D/C Entereg Advance diet as tolerated Encourage ambulation Abdominal binder  Restart Eliquis tomorrow. Possible discharge tomorrow or Tuesday.  Pathology still pending  LOS: 9 days    Alexandria French 12/24/2020

## 2020-12-24 NOTE — Progress Notes (Signed)
[facility] PROGRESS NOTE    Progress Note  Alexandria French  DJS:[id number] DOB: 1951-03-13 DOA: 12/14/2020 PCP: Fulton Nation, MD     Brief Narrative:   Alexandria French is an 70 y.o. female past medical history significant for hypertrophic obstructive cardiomyopathy, paroxysmal atrial fibrillation on Eliquis, sick sinus syndrome with a pacemaker presents with increased shortness of breath and a near syncopal episode   Significant studies: Cardiac cath 12/19/2020 showed nonobstructive disease moderate reduced cardiac output.  Antibiotics: None  Microbiology data: Blood culture:  Procedures: 12/20/2020 colonoscopy that showed malignant tumor in the cecum biopsies were done severe diverticulosis in the entirety of the colon 12/23/2019 laparoscopic ileocolectomy   Assessment/Plan:   Normocytic anemia/iron deficiency anemia/acute blood loss anemia secondary to adenocarcinoma of the right colon: Patient is status post laparoscopic ileocolectomy intervention on 12/22/2020. Appreciate surgery's assistance. Foley discontinued, on a clear liquid diet. CT scan of the chest abdomen and pelvis does not show metastatic disease. Surgery to dictate when to start Eliquis and aspirin. Physical therapy evaluated the patient and recommended home health PT.  Acute on chronic diastolic heart failure Secondary to [facility]. With a peak gradient of 70 mmHg by cath unchanged from prior testing. Your metoprolol as orthostatic hypotension is limited to use of other antihypertensive medication She was diuresed and her creatinine trended up diuretics were held and her Creatinine has improved. Cardiac cath showed moderately reduced cardiac output to start on low-dose midodrine. Cardiology relate that his symptoms are probably due to his anemia. She is currently 59 kg.  Neurogenic orthostatic hypotension : Midodrine has been increased to 10 mg p.o. 3 times daily Continue compression  stockings.  Elevated troponins: Cardiac cath showed nonobstructive disease. NSTEMI has been ruled out holding aspirin,  Mild hypokalemia: Improved.  Paroxysmal atrial fibrillation: Holding Eliquis now in sinus rhythm.  Continue metoprolol.  Acute kidney Injury: Likely due to overdiuresis diuretics were held creatinine has returned to baseline.   DVT prophylaxis: SCDs Family Communication:none Status is: Inpatient  Remains inpatient appropriate because:Hemodynamically unstable  Dispo: The patient is from: Home              Anticipated d/c is to: Home              Patient currently is not medically stable to d/c.   Difficult to place patient No     Code Status:     Code Status Orders  (From admission, onward)           Start     Ordered   12/15/20 0414  Full code  Continuous        12/15/20 0415           Code Status History     Date Active Date Inactive Code Status Order ID Comments User Context   02/28/2020 1519 02/28/2020 2157 Full Code [id number]  Croitoru, Dani Gobble, MD Inpatient      Advance Directive Documentation    Flowsheet Row Most Recent Value  Type of Advance Directive Healthcare Power of Attorney, Living will  Pre-existing out of facility DNR order (yellow form or pink MOST form) --  "MOST" Form in Place? --         IV Access:   Peripheral IV   Procedures and diagnostic studies:   No results found.   Medical Consultants:   None.   Subjective:    Alexandria French she relates her pain is controlled passing gas denies any abdominal pain.  Objective:    Vitals:  12/23/20 1021 12/23/20 1419 12/23/20 2150 12/24/20 0647  BP: 125/61 (!) 120/49 (!) 112/58   Pulse: 61 65 71   Resp:  18  18  Temp:  98 F (36.7 C) (!) 97.5 F (36.4 C) 98.3 F (36.8 C)  TempSrc:  Oral Oral Oral  SpO2:  98% 97% 98%  Weight:      Height:       SpO2: 98 % O2 Flow Rate (L/min): 1 L/min   Intake/Output Summary (Last 24 hours) at 12/24/2020  0909 Last data filed at 12/23/2020 1300 Gross per 24 hour  Intake 286 ml  Output 650 ml  Net -364 ml    Filed Weights   12/22/20 0504 12/22/20 0824 12/23/20 0501  Weight: 60 kg 60 kg 58.2 kg    Exam: General exam: In no acute distress. Respiratory system: Good air movement and clear to auscultation. Cardiovascular system: S1 & S2 heard, RRR. No JVD. Gastrointestinal system: Abdomen is nondistended, soft and nontender.  Extremities: No pedal edema. Skin: No rashes, lesions or ulcers Psychiatry: Judgement and insight appear normal. Mood & affect appropriate.   Data Reviewed:    Labs: Basic Metabolic Panel: Recent Labs  Lab 12/18/20 0348 12/19/20 0215 12/20/20 0217 12/21/20 0201 12/22/20 0040 12/23/20 0215  NA 135 136 138 134* 132* 135  K 3.6 3.4* 3.6 3.4* 3.4* 4.5  CL 97* 96* 97* 96* 97* 102  CO2 28 29 23 27 27 26   GLUCOSE 99 101* 110* 84 87 128*  BUN 15 18 16 11  7* 8  CREATININE 0.99 0.97 1.29* 0.86 0.92 0.86  CALCIUM 8.5* 9.2 10.4* 10.1 9.6 9.5  MG 2.4 2.3 2.3 2.2 2.0  --     GFR Estimated Creatinine Clearance: 50.2 mL/min (by C-G formula based on SCr of 0.86 mg/dL). Liver Function Tests: No results for input(s): AST, ALT, ALKPHOS, BILITOT, PROT, ALBUMIN in the last 168 hours. No results for input(s): LIPASE, AMYLASE in the last 168 hours. No results for input(s): AMMONIA in the last 168 hours. Coagulation profile No results for input(s): INR, PROTIME in the last 168 hours.  COVID-19 Labs  No results for input(s): DDIMER, FERRITIN, LDH, CRP in the last 72 hours.   Lab Results  Component Value Date   SARSCOV2NAA NEGATIVE 12/14/2020   [place] NEGATIVE 02/25/2020    CBC: Recent Labs  Lab 12/20/20 0449 12/21/20 0201 12/22/20 0040 12/23/20 0215 12/24/20 0217  WBC 7.1 4.8 5.3 12.9* 9.2  HGB 9.5* 9.2* 8.2* 8.7* 8.2*  HCT 29.0* 28.5* 25.0* 27.5* 26.6*  MCV 96.0 96.3 95.8 98.9 100.0  PLT 313 292 256 255 263    Cardiac Enzymes: No results  for input(s): CKTOTAL, CKMB, CKMBINDEX, TROPONINI in the last 168 hours. BNP (last 3 results) No results for input(s): PROBNP in the last 8760 hours. CBG: No results for input(s): GLUCAP in the last 168 hours.  D-Dimer: No results for input(s): DDIMER in the last 72 hours. Hgb A1c: No results for input(s): HGBA1C in the last 72 hours. Lipid Profile: No results for input(s): CHOL, HDL, LDLCALC, TRIG, CHOLHDL, LDLDIRECT in the last 72 hours. Thyroid function studies: No results for input(s): TSH, T4TOTAL, T3FREE, THYROIDAB in the last 72 hours.  Invalid input(s): FREET3 Anemia work up: No results for input(s): VITAMINB12, FOLATE, FERRITIN, TIBC, IRON, RETICCTPCT in the last 72 hours.  Sepsis Labs: Recent Labs  Lab 12/21/20 0201 12/22/20 0040 12/23/20 0215 12/24/20 0217  WBC 4.8 5.3 12.9* 9.2    Microbiology Recent Results (from  the past 240 hour(s))  Resp Panel by RT-PCR (Flu A&B, Covid) Nasopharyngeal Swab     Status: None   Collection Time: 12/14/20 11:45 PM   Specimen: Nasopharyngeal Swab; Nasopharyngeal(NP) swabs in vial transport medium  Result Value Ref Range Status   SARS Coronavirus 2 by RT PCR NEGATIVE NEGATIVE Final    Comment: (NOTE) SARS-CoV-2 target nucleic acids are NOT DETECTED.  The SARS-CoV-2 RNA is generally detectable in upper respiratory specimens during the acute phase of infection. The lowest concentration of SARS-CoV-2 viral copies this assay can detect is 138 copies/mL. A negative result does not preclude SARS-Cov-2 infection and should not be used as the sole basis for treatment or other patient management decisions. A negative result may occur with  improper specimen collection/handling, submission of specimen other than nasopharyngeal swab, presence of viral mutation(s) within the areas targeted by this assay, and inadequate number of viral copies(<138 copies/mL). A negative result must be combined with clinical observations, patient history,  and epidemiological information. The expected result is Negative.  Fact Sheet for Patients:  EntrepreneurPulse.com.au  Fact Sheet for Healthcare Providers:  IncredibleEmployment.be  This test is no t yet approved or cleared by the [place] FDA and  has been authorized for detection and/or diagnosis of SARS-CoV-2 by FDA under an Emergency Use Authorization (EUA). This EUA will remain  in effect (meaning this test can be used) for the duration of the COVID-19 declaration under Section 564(b)(1) of the Act, 21 U.S.C.section 360bbb-3(b)(1), unless the authorization is terminated  or revoked sooner.       Influenza A by PCR NEGATIVE NEGATIVE Final   Influenza B by PCR NEGATIVE NEGATIVE Final    Comment: (NOTE) The Xpert Xpress SARS-CoV-2/FLU/RSV plus assay is intended as an aid in the diagnosis of influenza from Nasopharyngeal swab specimens and should not be used as a sole basis for treatment. Nasal washings and aspirates are unacceptable for Xpert Xpress SARS-CoV-2/FLU/RSV testing.  Fact Sheet for Patients: EntrepreneurPulse.com.au  Fact Sheet for Healthcare Providers: IncredibleEmployment.be  This test is not yet approved or cleared by the [place] FDA and has been authorized for detection and/or diagnosis of SARS-CoV-2 by FDA under an Emergency Use Authorization (EUA). This EUA will remain in effect (meaning this test can be used) for the duration of the COVID-19 declaration under Section 564(b)(1) of the Act, 21 U.S.C. section 360bbb-3(b)(1), unless the authorization is terminated or revoked.  Performed at [facility], [address], [place], [place] [address]   Culture, blood (routine x 2)     Status: None   Collection Time: 12/15/20  9:52 AM   Specimen: BLOOD  Result Value Ref Range Status   Specimen Description BLOOD RIGHT ANTECUBITAL  Final   Special Requests   Final    BOTTLES DRAWN  AEROBIC AND ANAEROBIC Blood Culture adequate volume   Culture   Final    NO GROWTH 5 DAYS Performed at [facility], [address], [place], [place] [address]    Report Status 12/20/2020 FINAL  Final  Culture, blood (routine x 2)     Status: None   Collection Time: 12/15/20 10:03 AM   Specimen: BLOOD LEFT FOREARM  Result Value Ref Range Status   Specimen Description BLOOD LEFT FOREARM  Final   Special Requests   Final    BOTTLES DRAWN AEROBIC AND ANAEROBIC Blood Culture adequate volume   Culture   Final    NO GROWTH 5 DAYS Performed at [facility], [address] [place], [place] [address]    Report Status 12/20/2020 FINAL  Final  MRSA Next Gen by PCR, Nasal     Status: None   Collection Time: 12/15/20  3:00 PM   Specimen: Nasal Mucosa; Nasal Swab  Result Value Ref Range Status   MRSA by PCR Next Gen NOT DETECTED NOT DETECTED Final    Comment: (NOTE) The GeneXpert MRSA Assay (FDA approved for NASAL specimens only), is one component of a comprehensive MRSA colonization surveillance program. It is not intended to diagnose MRSA infection nor to guide or monitor treatment for MRSA infections. Test performance is not FDA approved in patients less than 73 years old. Performed at [facility], [place] [address], [place], [place] [address]   Surgical pcr screen     Status: None   Collection Time: 12/22/20  5:38 AM   Specimen: Nasal Mucosa; Nasal Swab  Result Value Ref Range Status   MRSA, PCR NEGATIVE NEGATIVE Final   Staphylococcus aureus NEGATIVE NEGATIVE Final    Comment: (NOTE) The Xpert SA Assay (FDA approved for NASAL specimens in patients 79 years of age and older), is one component of a comprehensive surveillance program. It is not intended to diagnose infection nor to guide or monitor treatment. Performed at [facility], [place] [address], [place], [place] [address]      Medications:    alvimopan  12 mg Oral BID   vitamin C  500 mg Oral Daily    calcium-vitamin D  1 tablet Oral BID   enoxaparin (LOVENOX) injection  40 mg Subcutaneous Q24H   hydrocortisone  25 mg Rectal BID   influenza vaccine adjuvanted  0.5 mL Intramuscular Tomorrow-1000   loratadine  10 mg Oral Daily   LORazepam  0.5 mg Oral Q48H   melatonin  3 mg Oral QHS   metoprolol tartrate  50 mg Oral BID   midodrine  10 mg Oral TID WC   pneumococcal 23 valent vaccine  0.5 mL Intramuscular Tomorrow-1000   potassium chloride  20 mEq Oral BID   simvastatin  20 mg Oral Daily   sodium chloride flush  3 mL Intravenous Q12H   Continuous Infusions:     LOS: 9 days   Charlynne Cousins  [facility]  12/24/2020, 9:09 AM

## 2020-12-25 ENCOUNTER — Other Ambulatory Visit (HOSPITAL_COMMUNITY): Payer: Self-pay

## 2020-12-25 ENCOUNTER — Ambulatory Visit (HOSPITAL_BASED_OUTPATIENT_CLINIC_OR_DEPARTMENT_OTHER): Payer: Medicare PPO | Admitting: Family

## 2020-12-25 DIAGNOSIS — I214 Non-ST elevation (NSTEMI) myocardial infarction: Secondary | ICD-10-CM | POA: Diagnosis not present

## 2020-12-25 DIAGNOSIS — K648 Other hemorrhoids: Secondary | ICD-10-CM

## 2020-12-25 DIAGNOSIS — I421 Obstructive hypertrophic cardiomyopathy: Secondary | ICD-10-CM | POA: Diagnosis not present

## 2020-12-25 DIAGNOSIS — R195 Other fecal abnormalities: Secondary | ICD-10-CM | POA: Diagnosis not present

## 2020-12-25 MED ORDER — APIXABAN 5 MG PO TABS
5.0000 mg | ORAL_TABLET | Freq: Two times a day (BID) | ORAL | Status: DC
  Administered 2020-12-25: 5 mg via ORAL
  Filled 2020-12-25: qty 1

## 2020-12-25 MED ORDER — MIDODRINE HCL 10 MG PO TABS
10.0000 mg | ORAL_TABLET | Freq: Three times a day (TID) | ORAL | 3 refills | Status: DC
  Filled 2020-12-25: qty 90, 30d supply, fill #0

## 2020-12-25 MED ORDER — METOPROLOL TARTRATE 50 MG PO TABS
50.0000 mg | ORAL_TABLET | Freq: Two times a day (BID) | ORAL | 3 refills | Status: DC
  Filled 2020-12-25: qty 60, 30d supply, fill #0

## 2020-12-25 MED ORDER — ACETAMINOPHEN 500 MG PO TABS: 1000.0000 mg | ORAL_TABLET | Freq: Four times a day (QID) | ORAL | 0 refills | Status: DC | PRN

## 2020-12-25 NOTE — Progress Notes (Addendum)
Progress Note  Patient Name: Alexandria French Date of Encounter: 12/25/2020  [facility] Cardiologist: Sanda Klein, MD   Subjective   No complaints. Has had diarrhea this morning.  Inpatient Medications    Scheduled Meds:  vitamin C  500 mg Oral Daily   calcium-vitamin D  1 tablet Oral BID   enoxaparin (LOVENOX) injection  40 mg Subcutaneous Q24H   hydrocortisone  25 mg Rectal BID   influenza vaccine adjuvanted  0.5 mL Intramuscular Tomorrow-1000   loratadine  10 mg Oral Daily   LORazepam  0.5 mg Oral Q48H   melatonin  3 mg Oral QHS   metoprolol tartrate  50 mg Oral BID   midodrine  10 mg Oral TID WC   pneumococcal 23 valent vaccine  0.5 mL Intramuscular Tomorrow-1000   potassium chloride  20 mEq Oral BID   simvastatin  20 mg Oral Daily   sodium chloride flush  3 mL Intravenous Q12H   Continuous Infusions:  PRN Meds: acetaminophen, morphine injection, ondansetron (ZOFRAN) IV, oxyCODONE, sodium chloride flush, zolpidem   Vital Signs    Vitals:   12/24/20 1054 12/24/20 1209 12/24/20 2038 12/25/20 0506  BP: (!) 129/48 (!) 129/57 115/73 (!) 106/50  Pulse: 63 62 60 64  Resp: 17 19 18 18   Temp: 98.8 F (37.1 C) 98.8 F (37.1 C) 98.1 F (36.7 C) 98.2 F (36.8 C)  TempSrc: Oral Oral Oral Oral  SpO2: 97%  98% 96%  Weight:    59.1 kg  Height:        Intake/Output Summary (Last 24 hours) at 12/25/2020 0835 Last data filed at 12/25/2020 0506 Gross per 24 hour  Intake 240 ml  Output 2600 ml  Net -2360 ml   Last 3 Weights 12/25/2020 12/23/2020 12/22/2020  Weight (lbs) 130 lb 4.8 oz 128 lb 4.9 oz 132 lb 4.4 oz  Weight (kg) 59.104 kg 58.2 kg 60 kg      Telemetry    Sinus rhythm HR 70s and paced rhythm - Personally Reviewed  ECG    No new tracings - Personally Reviewed  Physical Exam   GEN: No acute distress.   Neck: No JVD Cardiac: RRR, systolic murmur  Respiratory: Clear to auscultation bilaterally. GI: Soft, nontender, non-distended  MS: No edema; No  deformity. Neuro:  Nonfocal  Psych: Normal affect   Labs    High Sensitivity Troponin:   Recent Labs  Lab 12/14/20 2130 12/14/20 2345 12/15/20 0618 12/15/20 0817  TROPONINIHS 593* 502* 601* 652*     Chemistry Recent Labs  Lab 12/20/20 0217 12/21/20 0201 12/22/20 0040 12/23/20 0215  NA 138 134* 132* 135  K 3.6 3.4* 3.4* 4.5  CL 97* 96* 97* 102  CO2 23 27 27 26   GLUCOSE 110* 84 87 128*  BUN 16 11 7* 8  CREATININE 1.29* 0.86 0.92 0.86  CALCIUM 10.4* 10.1 9.6 9.5  MG 2.3 2.2 2.0  --   GFRNONAA 45* >60 >60 >60  ANIONGAP 18* 11 8 7     Lipids No results for input(s): CHOL, TRIG, HDL, LABVLDL, LDLCALC, CHOLHDL in the last 168 hours.  Hematology Recent Labs  Lab 12/22/20 0040 12/23/20 0215 12/24/20 0217  WBC 5.3 12.9* 9.2  RBC 2.61* 2.78* 2.66*  HGB 8.2* 8.7* 8.2*  HCT 25.0* 27.5* 26.6*  MCV 95.8 98.9 100.0  MCH 31.4 31.3 30.8  MCHC 32.8 31.6 30.8  RDW 13.4 14.0 14.4  PLT 256 255 263   Thyroid No results for input(s): TSH, FREET4  in the last 168 hours.  BNPNo results for input(s): BNP, PROBNP in the last 168 hours.  DDimer No results for input(s): DDIMER in the last 168 hours.   Radiology    No results found.  Cardiac Studies   Right and left heart cath 12/15/20:   LV end diastolic pressure is severely elevated.   Hemodynamic findings consistent with mild pulmonary hypertension.   SUMMARY Angiographically Normal Coronary arteries with a left dominant system. Significant LVOT gradient with apical LV pressures of 184/12 mmHg with an EDP of 25, mid LV cavity 157/9 mmHg with EDP of 25 and outflow tract pressure of 111/9 mmHg with a stable EDP of 24 mmHg. Mild Secondary Pulmonary Hypertension with mean PAP of 29 mmHg and PCWP of 29 mmHg. Moderately reduced cardiac function with a cardiac output-index of 3.26 and 2.02 by Fick and 3.55-2.2 by thermodilution    Echo 12/15/20:  1. Left ventricular ejection fraction, by estimation, is 55-60%. The left  ventricle  demonstrates regional wall motion abnormalities that are  consistent with a left bundle branch block.There is severe asymmetric left  ventricular hypertrophy. Left  ventricular diastolic parameters are consistent with Grade I diastolic  dysfunction (impaired relaxation). No significant LVOT Obstructive seen in  this study.   2. Right ventricular systolic function is normal. The right ventricular  size is normal. There is normal pulmonary artery systolic pressure.   3. The mitral valve is normal in structure. Mild mitral valve  regurgitation. No evidence of mitral stenosis.   4. The aortic valve was not well visualized. Aortic valve regurgitation  is trivial. No aortic stenosis is present.   5. The inferior vena cava is normal in size with <50% respiratory  variability, suggesting right atrial pressure of 8 mmHg.   6. Technically difficult acquisition; in future studies consider use of  echo-contrast.   Patient Profile     70 y.o. female with hypertrophic obstructive cardiomyopathy, paroxysmal atrial fibrillation on Eliquis, sick sinus syndrome status post pacemaker, hypertension, nonobstructive CAD who was admitted on 12/15/2020 with dizziness acute hypoxic respiratory failure secondary to decompensated diastolic heart failure.  She underwent left and right heart catheterization which showed volume overload and no evidence of CAD.  She was compensated based on pulmonary venous saturation.  Course has been complicated by anemia and she has now been found to have a colonic cancer post resection on 12/22/20   Assessment & Plan    HOCM - continue BB - additional medications limited by BP - gradient unchanged on cath   Neurogenic orthostatic hypotension - maintained on midodrine, increased to 10 mg TID yesterday - continue BB for HOCM - BP when I was in the room was 142/55 - consider reducing midodrine to 5 mg TID   Colon cancer - s/p cecal mass resection 12/22/20 not requiring  colostomy - taking PO - has had BM   Atrial fibrillation Chronic anticoagulation SSS s/p PPM - surgery planning to resume eliquis today - continue BB   NSTEMI - troponin was elevated in the setting of hypotensio and HOCM - heart cath with normal coronaries - ASA and statin stopped   Cardiology follow up has been arranged.      For questions or updates, please contact [place] Please consult [email or web address] for contact info under        Signed, Ledora Bottcher, PA  12/25/2020, 8:35 AM    History and all data above reviewed.  Patient examined.  I agree with the findings as  above.   No SOB.  No chest pain.  No hypotension.  Midodrine held once today.   The patient exam reveals COR:RRR  ,  Lungs: Clear  ,  Abd: Positive bowel sounds, no rebound no guarding, Ext No edema  .  All available labs, radiology testing, previous records reviewed. Agree with documented assessment and plan.   Hypotension:  BP is improved.  I went over the meds with the patient and her husband.  Likely can have the midodrine reduced stopped as an out patient.  Dr. Sallyanne Kuster will follow and they can call the office with questions.  OK to continue the beta blocker.  We discussed when to hold this if the BP happens to be low.    Jeneen Rinks [facility]  10:46 AM  12/25/2020

## 2020-12-25 NOTE — TOC Initial Note (Signed)
Transition of Care [facility]) - Initial/Assessment Note    Patient Details  Name: Alexandria French MRN: [id number] Date of Birth: [birth year]  Transition of Care [facility]) CM/SW Contact:    Bethena Roys, RN Phone Number: 12/25/2020, 12:40 PM  Clinical Narrative: Case Manager spoke with the patient regarding home health services. Patient is agreeable to services with [facility]. Referral made with [facility] and start of care to begin within 24-48 hours post transition home. Patient states she has a cane, bedside commode and a rolling walker. Per patient, husband is home for support. Patient has transportation home. No further needs identified at this time.                 Expected Discharge Plan: [place] Barriers to Discharge: Continued Medical Work up   Patient Goals and CMS Choice Patient states their goals for this hospitalization and ongoing recovery are:: to return home CMS Medicare.gov Compare Post Acute Care list provided to:: Patient Choice offered to / list presented to : Patient  Expected Discharge Plan and Services Expected Discharge Plan: [place]   Discharge Planning Services: CM Consult Post Acute Care Choice: [place] arrangements for the past 2 months: Single Family Home Expected Discharge Date: 12/25/20               DME Arranged: N/A DME Agency: NA       HH Arranged: PT HH Agency: [place]   Time [facility] Agency Contacted: 30 Representative spoke with at [place]: Tommi Rumps  Prior Living Arrangements/Services Living arrangements for the past 2 months: [place] Lives with:: Spouse Patient language and need for interpreter reviewed:: Yes Do you feel safe going back to the place where you live?: Yes      Need for Family Participation in Patient Care: Yes (Comment) Care giver support system in place?: Yes (comment) Current home services: DME Criminal Activity/Legal Involvement  Pertinent to Current Situation/Hospitalization: No - Comment as needed  Activities of Daily Living Home Assistive Devices/Equipment: None ADL Screening (condition at time of admission) Patient's cognitive ability adequate to safely complete daily activities?: Yes Is the patient deaf or have difficulty hearing?: Yes (impaired hearing L ear) Does the patient have difficulty seeing, even when wearing glasses/contacts?: No Does the patient have difficulty concentrating, remembering, or making decisions?: Yes Patient able to express need for assistance with ADLs?: No Does the patient have difficulty dressing or bathing?: No Independently performs ADLs?: Yes (appropriate for developmental age) Does the patient have difficulty walking or climbing stairs?: Yes Weakness of Legs: None Weakness of Arms/Hands: None  Permission Sought/Granted Permission sought to share information with : Family Supports, Customer service manager, Case Optician, dispensing granted to share information with : Yes, Verbal Permission Granted     Permission granted to share info w AGENCY: [facility]      Emotional Assessment Appearance:: Appears stated age Attitude/Demeanor/Rapport: Engaged Affect (typically observed): Appropriate Orientation: : Oriented to Self, Oriented to Place, Oriented to  Time, Oriented to Situation Alcohol / Substance Use: Not Applicable Psych Involvement: No (comment)  Admission diagnosis:  NSTEMI (non-ST elevated myocardial infarction) [facility]) [I21.4] Patient Active Problem List   Diagnosis Date Noted   Absolute anemia    Heme positive stool    Hemorrhoids with complication    Respiratory failure ([place]) 12/16/2020   HOCM (hypertrophic obstructive cardiomyopathy) ([place]) 12/15/2020   Hypotension 12/15/2020   NSTEMI (non-ST elevated myocardial infarction) ([place]) 12/14/2020  SSS (sick sinus syndrome) ([place]) 02/28/2020   Encounter for screening colonoscopy 08/13/2019   Palpitations  06/07/2014   HTN (hypertension) 01/18/2014   Chest pain 08/13/2013   LBBB (left bundle branch block) 08/13/2013   PCP:  Washburn Nation, MD Pharmacy:   [id number] [place], [place] [address] [place] [address] Phone: [phone] Fax: [phone]  [facility] [phone] - [place], [place] [address] [place] [address] Phone: [phone] Fax: [phone]  [facility] [address] [place] [address] Phone: [phone] Fax: [phone]  Readmission Risk Interventions No flowsheet data found.

## 2020-12-25 NOTE — Progress Notes (Signed)
3 Days Post-Op   Subjective/Chief Complaint: No new complaints. Pain controlled. Tolerating PO. Reports flatus and BM this morning. Mobilizing in her room. Patient is concerned about her blood pressure medications.   Objective: Vital signs in last 24 hours: Temp:  [97.7 F (36.5 C)-98.8 F (37.1 C)] 97.7 F (36.5 C) (09/26 0916) Pulse Rate:  [60-64] 63 (09/26 0916) Resp:  [16-19] 16 (09/26 0916) BP: (106-142)/(48-73) 116/68 (09/26 0916) SpO2:  [96 %-98 %] 96 % (09/26 0506) Weight:  [59.1 kg] 59.1 kg (09/26 0506) Last BM Date: 12/24/20  Intake/Output from previous day: 09/25 0701 - 09/26 0700 In: 240 [P.O.:240] Out: 2600 [Urine:2600] Intake/Output this shift: Total I/O In: 3 [I.V.:3] Out: -     General appearance: alert, cooperative, and no distress GI: soft, mild incisional tenderness, honeycomb clean and dry   Lab Results:  Recent Labs    12/23/20 0215 12/24/20 0217  WBC 12.9* 9.2  HGB 8.7* 8.2*  HCT 27.5* 26.6*  PLT 255 263   BMET Recent Labs    12/23/20 0215  NA 135  K 4.5  CL 102  CO2 26  GLUCOSE 128*  BUN 8  CREATININE 0.86  CALCIUM 9.5   PT/INR No results for input(s): LABPROT, INR in the last 72 hours. ABG No results for input(s): PHART, HCO3 in the last 72 hours.  Invalid input(s): PCO2, PO2  Studies/Results: No results found.  Anti-infectives: Anti-infectives (From admission, onward)    Start     Dose/Rate Route Frequency Ordered Stop   12/22/20 0845  ciprofloxacin (CIPRO) IVPB 400 mg        400 mg 200 mL/hr over 60 Minutes Intravenous NOW 12/22/20 0832 12/22/20 1016   12/22/20 0845  metroNIDAZOLE (FLAGYL) IVPB 500 mg        500 mg 100 mL/hr over 60 Minutes Intravenous NOW 12/22/20 0832 12/22/20 1004   12/22/20 0834  metroNIDAZOLE (FLAGYL) 500 MG/100ML IVPB       Note to Pharmacy: Jeralene Huff   : cabinet override      12/22/20 0834 12/22/20 0945   12/22/20 0834  ciprofloxacin (CIPRO) 400 MG/200ML IVPB       Note to Pharmacy:  Jeralene Huff   : cabinet override      12/22/20 0834 12/22/20 0944   12/21/20 1130  ciprofloxacin (CIPRO) IVPB 400 mg  Status:  Discontinued        400 mg 200 mL/hr over 60 Minutes Intravenous On call to O.R. 12/21/20 0947 12/22/20 0559   12/21/20 1130  metroNIDAZOLE (FLAGYL) IVPB 500 mg  Status:  Discontinued        500 mg 100 mL/hr over 60 Minutes Intravenous On call to O.R. 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 Ninfa Linden 12/22/20, POD#3  Advance diet as tolerated Encourage ambulation Abdominal binder  H&H stable - Resume Eliquis.  Pathology still pending  Stable for discharge from a surgical perspective. I will arrange her post-operative follow up. Patient taking tylenol, no oxycodone for pain, so no pain  Rx was provided.   LOS: 10 days    Jill Alexanders 12/25/2020

## 2020-12-25 NOTE — Progress Notes (Signed)
[facility] PROGRESS NOTE    Progress Note  Alexandria French  IPJ:[id number] DOB: 10/17/1950 DOA: 12/14/2020 PCP: Kendall Park Nation, MD     Brief Narrative:   Alexandria French is an 70 y.o. female past medical history significant for hypertrophic obstructive cardiomyopathy, paroxysmal atrial fibrillation on Eliquis, sick sinus syndrome with a pacemaker presents with increased shortness of breath and a near syncopal episode   Significant studies: Cardiac cath 12/19/2020 showed nonobstructive disease moderate reduced cardiac output.  Antibiotics: None  Microbiology data: Blood culture:  Procedures: 12/20/2020 colonoscopy that showed malignant tumor in the cecum biopsies were done severe diverticulosis in the entirety of the colon 12/23/2019 laparoscopic ileocolectomy   Assessment/Plan:   Normocytic anemia/iron deficiency anemia/acute blood loss anemia secondary to adenocarcinoma of the right colon: Patient is status post laparoscopic ileocolectomy intervention on 12/22/2020. Appreciate surgery's assistance. Tolerating a regular diet. CT scan of the chest abdomen and pelvis does not show metastatic disease. Eliquis was started today. Physical therapy evaluated the patient and recommended home health PT. Awaiting further surgical recommendations. Pathology report still pending.  Acute on chronic diastolic heart failure Secondary to [facility]. With a peak gradient of 70 mmHg by cath unchanged from prior testing. Metoprolol as orthostatic hypotension is limiting to use of other antihypertensive medication She was diuresed and her creatinine trended up diuretics were held and her Creatinine has improved. Cardiac cath showed moderately reduced cardiac output to start on low-dose midodrine. Cardiology relate that his symptoms are probably due to his anemia. She is currently 59 kg.  Neurogenic orthostatic hypotension : Midodrine has been increased to 10 mg p.o. 3 times daily Continue  compression stockings.  Elevated troponins: Cardiac cath showed nonobstructive disease. NSTEMI has been ruled out holding aspirin,  Mild hypokalemia: Improved.  Paroxysmal atrial fibrillation: Start Eliquis, now in sinus rhythm.  Continue metoprolol.  Acute kidney Injury: Likely due to overdiuresis diuretics were held creatinine has returned to baseline.   DVT prophylaxis: SCDs Family Communication:none Status is: Inpatient  Remains inpatient appropriate because:Hemodynamically unstable  Dispo: The patient is from: Home              Anticipated d/c is to: Home              Patient currently is not medically stable to d/c.   Difficult to place patient No     Code Status:     Code Status Orders  (From admission, onward)           Start     Ordered   12/15/20 0414  Full code  Continuous        12/15/20 0415           Code Status History     Date Active Date Inactive Code Status Order ID Comments User Context   02/28/2020 1519 02/28/2020 2157 Full Code [id number]  Croitoru, Dani Gobble, MD Inpatient      Advance Directive Documentation    Flowsheet Row Most Recent Value  Type of Advance Directive Healthcare Power of Attorney, Living will  Pre-existing out of facility DNR order (yellow form or pink MOST form) --  "MOST" Form in Place? --         IV Access:   Peripheral IV   Procedures and diagnostic studies:   No results found.   Medical Consultants:   None.   Subjective:    Alexandria French relates no  adb. pain  Objective:    Vitals:   12/24/20 2038 12/25/20 0506 12/25/20 0850  12/25/20 0916  BP: 115/73 (!) 106/50 (!) 142/55 116/68  Pulse: 60 64 63 63  Resp: 18 18  16   Temp: 98.1 F (36.7 C) 98.2 F (36.8 C)  97.7 F (36.5 C)  TempSrc: Oral Oral  Oral  SpO2: 98% 96%    Weight:  59.1 kg    Height:       SpO2: 96 % O2 Flow Rate (L/min): 1 L/min   Intake/Output Summary (Last 24 hours) at 12/25/2020 0934 Last data filed at  12/25/2020 0851 Gross per 24 hour  Intake 243 ml  Output 2600 ml  Net -2357 ml    Filed Weights   12/22/20 0824 12/23/20 0501 12/25/20 0506  Weight: 60 kg 58.2 kg 59.1 kg    Exam: General exam: In no acute distress. Respiratory system: Good air movement and clear to auscultation. Cardiovascular system: S1 & S2 heard, RRR. No JVD. Gastrointestinal system: Abdomen is nondistended, soft and nontender.  Extremities: No pedal edema. Skin: No rashes, lesions or ulcers Psychiatry: Judgement and insight appear normal. Mood & affect appropriate.   Data Reviewed:    Labs: Basic Metabolic Panel: Recent Labs  Lab 12/19/20 0215 12/20/20 0217 12/21/20 0201 12/22/20 0040 12/23/20 0215  NA 136 138 134* 132* 135  K 3.4* 3.6 3.4* 3.4* 4.5  CL 96* 97* 96* 97* 102  CO2 29 23 27 27 26   GLUCOSE 101* 110* 84 87 128*  BUN 18 16 11  7* 8  CREATININE 0.97 1.29* 0.86 0.92 0.86  CALCIUM 9.2 10.4* 10.1 9.6 9.5  MG 2.3 2.3 2.2 2.0  --     GFR Estimated Creatinine Clearance: 50.2 mL/min (by C-G formula based on SCr of 0.86 mg/dL). Liver Function Tests: No results for input(s): AST, ALT, ALKPHOS, BILITOT, PROT, ALBUMIN in the last 168 hours. No results for input(s): LIPASE, AMYLASE in the last 168 hours. No results for input(s): AMMONIA in the last 168 hours. Coagulation profile No results for input(s): INR, PROTIME in the last 168 hours.  COVID-19 Labs  No results for input(s): DDIMER, FERRITIN, LDH, CRP in the last 72 hours.   Lab Results  Component Value Date   SARSCOV2NAA NEGATIVE 12/14/2020   [place] NEGATIVE 02/25/2020    CBC: Recent Labs  Lab 12/20/20 0449 12/21/20 0201 12/22/20 0040 12/23/20 0215 12/24/20 0217  WBC 7.1 4.8 5.3 12.9* 9.2  HGB 9.5* 9.2* 8.2* 8.7* 8.2*  HCT 29.0* 28.5* 25.0* 27.5* 26.6*  MCV 96.0 96.3 95.8 98.9 100.0  PLT 313 292 256 255 263    Cardiac Enzymes: No results for input(s): CKTOTAL, CKMB, CKMBINDEX, TROPONINI in the last 168  hours. BNP (last 3 results) No results for input(s): PROBNP in the last 8760 hours. CBG: No results for input(s): GLUCAP in the last 168 hours.  D-Dimer: No results for input(s): DDIMER in the last 72 hours. Hgb A1c: No results for input(s): HGBA1C in the last 72 hours. Lipid Profile: No results for input(s): CHOL, HDL, LDLCALC, TRIG, CHOLHDL, LDLDIRECT in the last 72 hours. Thyroid function studies: No results for input(s): TSH, T4TOTAL, T3FREE, THYROIDAB in the last 72 hours.  Invalid input(s): FREET3 Anemia work up: No results for input(s): VITAMINB12, FOLATE, FERRITIN, TIBC, IRON, RETICCTPCT in the last 72 hours.  Sepsis Labs: Recent Labs  Lab 12/21/20 0201 12/22/20 0040 12/23/20 0215 12/24/20 0217  WBC 4.8 5.3 12.9* 9.2    Microbiology Recent Results (from the past 240 hour(s))  Culture, blood (routine x 2)     Status: None  Collection Time: 12/15/20  9:52 AM   Specimen: BLOOD  Result Value Ref Range Status   Specimen Description BLOOD RIGHT ANTECUBITAL  Final   Special Requests   Final    BOTTLES DRAWN AEROBIC AND ANAEROBIC Blood Culture adequate volume   Culture   Final    NO GROWTH 5 DAYS Performed at [facility], [address], [place], [place] [address]    Report Status 12/20/2020 FINAL  Final  Culture, blood (routine x 2)     Status: None   Collection Time: 12/15/20 10:03 AM   Specimen: BLOOD LEFT FOREARM  Result Value Ref Range Status   Specimen Description BLOOD LEFT FOREARM  Final   Special Requests   Final    BOTTLES DRAWN AEROBIC AND ANAEROBIC Blood Culture adequate volume   Culture   Final    NO GROWTH 5 DAYS Performed at [facility], [place] [address] [place], [place] [address]    Report Status 12/20/2020 FINAL  Final  MRSA Next Gen by PCR, Nasal     Status: None   Collection Time: 12/15/20  3:00 PM   Specimen: Nasal Mucosa; Nasal Swab  Result Value Ref Range Status   MRSA by PCR Next Gen NOT DETECTED NOT DETECTED Final     Comment: (NOTE) The GeneXpert MRSA Assay (FDA approved for NASAL specimens only), is one component of a comprehensive MRSA colonization surveillance program. It is not intended to diagnose MRSA infection nor to guide or monitor treatment for MRSA infections. Test performance is not FDA approved in patients less than 34 years old. Performed at [facility], [place] [address], [place], [place] [address]   Surgical pcr screen     Status: None   Collection Time: 12/22/20  5:38 AM   Specimen: Nasal Mucosa; Nasal Swab  Result Value Ref Range Status   MRSA, PCR NEGATIVE NEGATIVE Final   Staphylococcus aureus NEGATIVE NEGATIVE Final    Comment: (NOTE) The Xpert SA Assay (FDA approved for NASAL specimens in patients 41 years of age and older), is one component of a comprehensive surveillance program. It is not intended to diagnose infection nor to guide or monitor treatment. Performed at [facility], [place] [address], [place], [place] [address]      Medications:    vitamin C  500 mg Oral Daily   calcium-vitamin D  1 tablet Oral BID   enoxaparin (LOVENOX) injection  40 mg Subcutaneous Q24H   hydrocortisone  25 mg Rectal BID   influenza vaccine adjuvanted  0.5 mL Intramuscular Tomorrow-1000   loratadine  10 mg Oral Daily   LORazepam  0.5 mg Oral Q48H   melatonin  3 mg Oral QHS   metoprolol tartrate  50 mg Oral BID   midodrine  10 mg Oral TID WC   pneumococcal 23 valent vaccine  0.5 mL Intramuscular Tomorrow-1000   potassium chloride  20 mEq Oral BID   simvastatin  20 mg Oral Daily   sodium chloride flush  3 mL Intravenous Q12H   Continuous Infusions:     LOS: 10 days   Charlynne Cousins  [facility]  12/25/2020, 9:34 AM

## 2020-12-25 NOTE — Plan of Care (Signed)

## 2020-12-25 NOTE — Care Management Important Message (Signed)
Important Message  Patient Details  Name: Alexandria French MRN: [id number] Date of Birth: 02/07/1951   Medicare Important Message Given:  Yes     Shelda Altes 12/25/2020, 12:12 PM

## 2020-12-25 NOTE — Discharge Summary (Addendum)
Physician Discharge Summary  Alexandria French LTJ:[id number] DOB: April 13, 1950 DOA: 12/14/2020  PCP: Prado Verde Nation, MD  Admit date: 12/14/2020 Discharge date: 12/25/2020  Admitted From: Home Disposition:  Home  Recommendations for Outpatient Follow-up:  Follow up with PCP in 1-2 weeks Please obtain BMP/CBC in one week Follow-up with oncology in 2 weeks to evaluate for neoadjuvant chemotherapy.  Follow-up with general surgery in 1 week for biopsy results   Home Health:no Equipment/Devices:none  Discharge Condition:Stable CODE STATUS:Full Diet recommendation: Heart Healthy Brief/Interim Summary: 70 y.o. female past medical history significant for hypertrophic obstructive cardiomyopathy, paroxysmal atrial fibrillation on Eliquis, sick sinus syndrome with a pacemaker presents with increased shortness of breath and a near syncopal episode     Significant studies: Cardiac cath 12/19/2020 showed nonobstructive disease moderate reduced cardiac output.  Discharge Diagnoses:  Principal Problem:   NSTEMI (non-ST elevated myocardial infarction) ([place]) Active Problems:   Chest pain   LBBB (left bundle branch block)   SSS (sick sinus syndrome) (H[facility])   HOCM (hypertrophic obstructive cardiomyopathy) (H[facility])   Hypotension   Respiratory failure ([facility])   Absolute anemia   Heme positive stool   Hemorrhoids with complication  Normocytic anemia/iron deficiency anemia/acute blood loss anemia secondary to adenocarcinoma of the right colon: Colonoscopy was performed that showed a right colonic mass biopsies were taken. General surgery was consulted recommended surgical intervention with a laparoscopic ileocolectomy on 12/22/2020. CT scan of the abdomen and pelvis and chest did not show any metastatic disease. Physical therapy evaluated the patient and recommended home health PT. She will follow up on pathology report with general surgery. She has been scheduled with oncology as an outpatient we will  follow-up to see if there is any need for neoadjuvant chemotherapy. Her Eliquis was restarted.  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 She was started on oral metoprolol, titration of metoprolol is limited due to borderline blood pressure. She was started on oral midodrine. After slow diuresis her creatinine started to trend up diuresis were held her creatinine returned to baseline. On the day of discharge she was weighing 59 kg.  Neurogenic orthostatic hypotension: Cardiology recommended start midodrine and compression stocking this improved mood symptoms significantly.  Elevated troponins: Cardiac cath showed nonobstructive disease in the setting of hokum. NSTEMI has been ruled out.  Mild hypokalemia: Improved with oral repletion.  Paroxysmal atrial fibrillation: She was continued on metoprolol and Eliquis no changes made to her medication.  Acute kidney injury: Likely due to overdiuresis and resolved with holding diuretic therapy.  Discharge Instructions  Discharge Instructions     Diet - low sodium heart healthy   Complete by: As directed    Increase activity slowly   Complete by: As directed    No wound care   Complete by: As directed       Allergies as of 12/25/2020       Reactions   Cefzil [cefprozil] Other (See Comments)   angioedema        Medication List     STOP taking these medications    bisoprolol 10 MG tablet Commonly known as: ZEBETA   bisoprolol 5 MG tablet Commonly known as: ZEBETA   CoQ-10 100 MG Caps   diltiazem 120 MG 24 hr capsule Commonly known as: DILACOR XR   vitamin C 500 MG tablet Commonly known as: ASCORBIC ACID       TAKE these medications  acetaminophen 500 MG tablet Commonly known as: TYLENOL Take 2 tablets (1,000 mg total) by mouth every 6 (six) hours as needed for fever or mild pain. What changed:  how much  to take when to take this reasons to take this   apixaban 5 MG Tabs tablet Commonly known as: Eliquis TAKE 1 TABLET(5 MG) BY MOUTH TWICE DAILY What changed:  how much to take how to take this when to take this additional instructions   Calcium Carbonate-Vitamin D 600-400 MG-UNIT tablet Take 1 tablet by mouth 2 (two) times daily.   cetirizine 10 MG tablet Commonly known as: ZYRTEC Take 10 mg by mouth every evening.   ferrous sulfate 325 (65 FE) MG EC tablet Take 325 mg by mouth in the morning and at bedtime.   LORazepam 1 MG tablet Commonly known as: ATIVAN Take 0.5 mg by mouth See admin instructions. Every other night   metoprolol tartrate 50 MG tablet Commonly known as: LOPRESSOR Take 1 tablet (50 mg total) by mouth 2 (two) times daily.   midodrine 10 MG tablet Commonly known as: PROAMATINE Take 1 tablet (10 mg total) by mouth 3 (three) times daily with meals.   simvastatin 20 MG tablet Commonly known as: ZOCOR Take 20 mg by mouth daily.   Sutab [phone] MG Tabs Generic drug: Sodium Sulfate-Mag Sulfate-KCl Use as directed for colonoscopy. MANUFACTURER CODES!! Kara Dies: [id number] PCN: CN GROUP: [id number] MEMBER ID: [id number];[employer] AS SECONDARY INSURANCE ;NO PRIOR AUTHORIZATION        Follow-up Information     Coralie Keens, MD. Go on 01/16/2021.   Specialty: General Surgery Why: at 10:50 AM for post-operative follow up. please arrive 20 minutes early to get checked in and fill out any necessary paperwork. Contact information: [place] [address] [phone]         Care, [facility] Follow up.   Specialty: Home Health Services Why: Physical Therapy-office to call with visit times. Contact information: [address] [place] [address] [phone]                Allergies  Allergen Reactions   Cefzil [Cefprozil] Other (See Comments)    angioedema    Consultations: Cardiology General  surgery   Procedures/Studies: DG Chest 2 View  Result Date: 12/14/2020 CLINICAL DATA:  Chest pain EXAM: CHEST - 2 VIEW COMPARISON:  10/18/2020 FINDINGS: Left-sided pacing device. Mild cardiomegaly without focal opacity or pleural effusion. Mild diffuse interstitial opacity, possible low-grade edema. Probable underlying bronchitic changes. Aortic atherosclerosis IMPRESSION: Cardiomegaly with probable mild vascular congestion interstitial edema. Electronically Signed   By: Donavan Foil M.D.   On: 12/14/2020 22:12   CT CHEST WO CONTRAST  Result Date: 12/18/2020 CLINICAL DATA:  Acute hypoxic respiratory failure secondary to decompensated diastolic heart failure. EXAM: CT CHEST WITHOUT CONTRAST TECHNIQUE: Multidetector CT imaging of the chest was performed following the standard protocol without IV contrast. COMPARISON:  December 15, 2020. FINDINGS: Cardiovascular: Atherosclerosis of thoracic aorta is noted without aneurysm formation. Mild cardiomegaly is noted. Left-sided pacemaker is noted. Mediastinum/Nodes: Moderate size sliding-type hiatal hernia is noted. No adenopathy is noted. Thyroid gland is unremarkable. Lungs/Pleura: No pneumothorax is noted. Small bilateral pleural effusions are noted with adjacent subsegmental atelectasis of both lower lobes. Mild septal thickening is noted in both upper lobes concerning for pulmonary edema. Upper Abdomen: No acute abnormality. Musculoskeletal: No chest wall mass or suspicious bone lesions identified. IMPRESSION: Small bilateral pleural effusions are noted with adjacent subsegmental  atelectasis of both lower lobes. Mild septal thickening is noted in both upper lobes concerning for pulmonary edema. Moderate size sliding-type hiatal hernia. Aortic Atherosclerosis (ICD10-I70.0). Electronically Signed   By: Marijo Conception M.D.   On: 12/18/2020 10:13   CT Angio Chest Pulmonary Embolism (PE) W or WO Contrast  Result Date: 12/15/2020 CLINICAL DATA:  Palpitations  with dizziness and lightheadedness. EXAM: CT ANGIOGRAPHY CHEST WITH CONTRAST TECHNIQUE: Multidetector CT imaging of the chest was performed using the standard protocol during bolus administration of intravenous contrast. Multiplanar CT image reconstructions and MIPs were obtained to evaluate the vascular anatomy. CONTRAST:  160mL OMNIPAQUE IOHEXOL 350 MG/ML SOLN COMPARISON:  December 01, 2018 FINDINGS: Cardiovascular: Satisfactory opacification of the pulmonary arteries to the segmental level. No evidence of pulmonary embolism. There is mild cardiomegaly mild coronary artery calcification. No pericardial effusion. Mediastinum/Nodes: No enlarged mediastinal, hilar, or axillary lymph nodes. Peribronchial thickening is seen involving the bilateral mainstem bronchi and multiple proximal branches. The thyroid gland and esophagus demonstrate no significant findings. Lungs/Pleura: Markedly severity interstitial thickening is noted throughout both lungs. Mild to moderate severity atelectasis and/or infiltrate is also seen within the left upper lobe and bilateral lower lobes. There are small bilateral pleural effusions. No pneumothorax is identified. Upper Abdomen: A moderate to large hiatal hernia is seen. Musculoskeletal: Degenerative changes seen throughout the thoracic spine. Review of the MIP images confirms the above findings. IMPRESSION: 1. No CT evidence of acute pulmonary embolism. 2. Markedly severity interstitial thickening throughout both lungs, consistent with pulmonary edema. 3. Mild to moderate severity left upper lobe and bilateral lower lobe atelectasis and/or infiltrate. 4. Small bilateral pleural effusions. 5. Moderate to large hiatal hernia. Electronically Signed   By: Virgina Norfolk M.D.   On: 12/15/2020 02:37   CT ABDOMEN PELVIS W CONTRAST  Result Date: 12/22/2020 CLINICAL DATA:  Probable abdominal mass. EXAM: CT ABDOMEN AND PELVIS WITH CONTRAST TECHNIQUE: Multidetector CT imaging of the abdomen  and pelvis was performed using the standard protocol following bolus administration of intravenous contrast. CONTRAST:  59mL OMNIPAQUE IOHEXOL 350 MG/ML SOLN COMPARISON:  None. FINDINGS: Lower chest: Trace bilateral pleural effusions with minimal bibasilar atelectasis. Cardiac pacemaker wires noted. No intra-abdominal free air or free fluid. Hepatobiliary: Focal area of calcification of the posterior aspect of the inferior right liver capsule likely sequela of prior insult. The liver is otherwise unremarkable. No intrahepatic biliary ductal dilatation. The gallbladder is unremarkable. Pancreas: Unremarkable. No pancreatic ductal dilatation or surrounding inflammatory changes. Spleen: Normal in size without focal abnormality. Adrenals/Urinary Tract: The adrenal glands unremarkable. There is a 15 mm left renal upper pole cyst. There is no hydronephrosis on either side. The visualized ureters and urinary bladder appear unremarkable. Stomach/Bowel: There is colonic diverticulosis without active inflammatory changes. Small hiatal hernia. There is no bowel obstruction or active inflammation. The appendix is normal. Vascular/Lymphatic: Moderate aortoiliac atherosclerotic disease. The IVC is unremarkable. No portal venous gas. There is no adenopathy. Reproductive: Hysterectomy.  No adnexal masses. Other: None Musculoskeletal: Degenerative changes of the spine. No acute osseous pathology. IMPRESSION: 1. No acute intra-abdominal or pelvic pathology. 2. Colonic diverticulosis. No bowel obstruction. Normal appendix. 3. Trace bilateral pleural effusions. 4. Aortic Atherosclerosis (ICD10-I70.0). Electronically Signed   By: Anner Crete M.D.   On: 12/22/2020 01:54   CARDIAC CATHETERIZATION  Result Date: 5/78/4696   LV end diastolic pressure is severely elevated.   Hemodynamic findings consistent with mild pulmonary hypertension. SUMMARY Angiographically Normal Coronary arteries with a left dominant system. Significant  LVOT gradient with apical LV pressures of 184/12 mmHg with an EDP of 25, mid LV cavity 157/9 mmHg with EDP of 25 and outflow tract pressure of 111/9 mmHg with a stable EDP of 24 mmHg. Mild Secondary Pulmonary Hypertension with mean PAP of 29 mmHg and PCWP of 29 mmHg. Moderately reduced cardiac function with a cardiac output-index of 3.26 and 2.02 by Fick and 3.55-2.2 by thermodilution. RECOMMENDATIONS Gentle titration of medications to allow for diuresis without leading to systemic hypotension. Defer decision making to rounding cardiology service. Glenetta Hew, MD  DG CHEST PORT 1 VIEW  Result Date: 12/17/2020 CLINICAL DATA:  Shortness of breath EXAM: PORTABLE CHEST 1 VIEW COMPARISON:  Chest x-ray December 14, 2020. FINDINGS: Pacemaker is stable. The cardiomediastinal silhouette is stable. No pneumothorax. Diffuse interstitial opacities. Increasing haziness in the right base. Increasing opacity in the left retrocardiac region with obscuration left hemidiaphragm. Probable small layering effusion. IMPRESSION: 1. Cardiomegaly and pulmonary edema. 2. Increasing haziness over the right base may represent layering effusion with underlying atelectasis. Developing infiltrate not excluded. 3. Increasing opacity and probable small associated effusion in the left base/retrocardiac region. The opacity may represent atelectasis. Recommend attention on follow-up. Electronically Signed   By: Dorise Bullion III M.D.   On: 12/17/2020 08:35   ECHOCARDIOGRAM COMPLETE  Result Date: 12/15/2020    ECHOCARDIOGRAM REPORT   Patient Name:   Alexandria French Date of Exam: 12/15/2020 Medical Rec #:  [id number]     Height:       62.0 in Accession #:    [id number]    Weight:       135.0 lb Date of Birth:  December 30, 1950    BSA:          1.618 m Patient Age:    70 years      BP:           108/56 mmHg Patient Gender: F             HR:           76 bpm. Exam Location:  Inpatient Procedure: 2D Echo, Cardiac Doppler and Color Doppler                                 MODIFIED REPORT:     This report was modified by Rudean Haskell MD on 12/15/2020 due to                                    Spelling.  Indications:     Acute MI  History:         Patient has prior history of Echocardiogram examinations, most                  recent 08/27/2019. Acute MI, Arrythmias:LBBB; Risk                  Factors:Hypertension.  Sonographer:     MH Referring Phys:  [id number] Stevan Born MARTIN Diagnosing Phys: Rudean Haskell MD IMPRESSIONS  1. Left ventricular ejection fraction, by estimation, is 55-60%. The left ventricle demonstrates regional wall motion abnormalities that are consistent with a left bundle branch block.There is severe asymmetric left ventricular hypertrophy. Left ventricular diastolic parameters are consistent with Grade I diastolic dysfunction (impaired relaxation). No significant LVOT Obstructive seen in this study.  2. Right ventricular systolic function is normal. The  right ventricular size is normal. There is normal pulmonary artery systolic pressure.  3. The mitral valve is normal in structure. Mild mitral valve regurgitation. No evidence of mitral stenosis.  4. The aortic valve was not well visualized. Aortic valve regurgitation is trivial. No aortic stenosis is present.  5. The inferior vena cava is normal in size with <50% respiratory variability, suggesting right atrial pressure of 8 mmHg.  6. Technically difficult acquisition; in future studies consider use of echo-contrast. Comparison(s): A prior study was performed on 08/27/19. Prior images reviewed side by side. Compared to prior septal wall motion is new. LVOT obstructive more prominent on prior study. Conclusion(s)/Recommendation(s): Consider cardiac MRI for assessment of hypertrophic cardioymyopathy. FINDINGS  Left Ventricle: Left ventricular ejection fraction, by estimation, is 55 to 60%. The left ventricle has normal function. The left ventricle demonstrates regional wall motion  abnormalities. The left ventricular internal cavity size was small. There is severe asymmetric left ventricular hypertrophy. Abnormal (paradoxical) septal motion, consistent with left bundle branch block. Left ventricular diastolic parameters are consistent with Grade I diastolic dysfunction (impaired relaxation). Right Ventricle: The right ventricular size is normal. Right vetricular wall thickness was not well visualized. Right ventricular systolic function is normal. There is normal pulmonary artery systolic pressure. The tricuspid regurgitant velocity is 1.60 m/s, and with an assumed right atrial pressure of 8 mmHg, the estimated right ventricular systolic pressure is 69.4 mmHg. Left Atrium: Left atrial size was normal in size. Right Atrium: Right atrial size was not well visualized. Pericardium: There is no evidence of pericardial effusion. Mitral Valve: The mitral valve is normal in structure. Mild mitral valve regurgitation. No evidence of mitral valve stenosis. Tricuspid Valve: The tricuspid valve is grossly normal. Tricuspid valve regurgitation is trivial. No evidence of tricuspid stenosis. Aortic Valve: The aortic valve was not well visualized. Aortic valve regurgitation is trivial. No aortic stenosis is present. Aortic valve mean gradient measures 3.5 mmHg. Aortic valve peak gradient measures 13.7 mmHg. Aortic valve area, by VTI measures 2.92 cm. Pulmonic Valve: The pulmonic valve was not well visualized. Pulmonic valve regurgitation is not visualized. Aorta: The aortic root and ascending aorta are structurally normal, with no evidence of dilitation. Venous: The inferior vena cava is normal in size with less than 50% respiratory variability, suggesting right atrial pressure of 8 mmHg. IAS/Shunts: The atrial septum is grossly normal.  LEFT VENTRICLE PLAX 2D LVIDd:         3.70 cm     Diastology LVIDs:         2.40 cm     LV e' medial:  7.07 cm/s LV PW:         1.80 cm     LV e' lateral: 7.18 cm/s LV IVS:         1.50 cm LVOT diam:     1.90 cm LV SV:         217 LV SV Index:   134 LVOT Area:     2.84 cm  LV Volumes (MOD) LV vol d, MOD A4C: 25.7 ml LV vol s, MOD A4C: 7.6 ml LV SV MOD A4C:     25.7 ml RIGHT VENTRICLE RV S prime:     6.20 cm/s TAPSE (M-mode): 2.0 cm LEFT ATRIUM           Index LA diam:      3.50 cm 2.16 cm/m LA Vol (A2C): 22.2 ml 13.72 ml/m LA Vol (A4C): 35.1 ml 21.70 ml/m  AORTIC VALVE AV Area (Vmax):  3.77 cm AV Area (Vmean):   2.70 cm AV Area (VTI):     2.92 cm AV Vmax:           185.00 cm/s AV Vmean:          195.400 cm/s AV VTI:            0.743 m AV Peak Grad:      13.7 mmHg AV Mean Grad:      3.5 mmHg LVOT Vmax:         246.00 cm/s LVOT Vmean:        186.000 cm/s LVOT VTI:          0.764 m LVOT/AV VTI ratio: 1.03  AORTA Ao Root diam: 3.00 cm Ao Asc diam:  2.70 cm MR Peak grad: 47.3 mmHg   TRICUSPID VALVE MR Vmax:      344.00 cm/s TR Peak grad:   10.2 mmHg                           TR Vmax:        160.00 cm/s                            SHUNTS                           Systemic VTI:  0.76 m                           Systemic Diam: 1.90 cm Rudean Haskell MD Electronically signed by Rudean Haskell MD Signature Date/Time: 12/15/2020/12:52:48 PM    Final (Updated)    CUP PACEART REMOTE DEVICE CHECK  Result Date: 11/27/2020 Scheduled remote reviewed. Normal device function.  2 PMT episodes. Next remote 91 days. LR  (Echo, Carotid, EGD, Colonoscopy, ERCP)    Subjective: Feels great tolerating her diet, last bowel movement was this morning.  Discharge Exam: Vitals:   12/25/20 0850 12/25/20 0916  BP: (!) 142/55 116/68  Pulse: 63 63  Resp:  16  Temp:  97.7 F (36.5 C)  SpO2:     Vitals:   12/24/20 2038 12/25/20 0506 12/25/20 0850 12/25/20 0916  BP: 115/73 (!) 106/50 (!) 142/55 116/68  Pulse: 60 64 63 63  Resp: 18 18  16   Temp: 98.1 F (36.7 C) 98.2 F (36.8 C)  97.7 F (36.5 C)  TempSrc: Oral Oral  Oral  SpO2: 98% 96%    Weight:  59.1 kg    Height:         General: Pt is alert, awake, not in acute distress Cardiovascular: RRR, S1/S2 +, no rubs, no gallops Respiratory: CTA bilaterally, no wheezing, no rhonchi Abdominal: Soft, NT, ND, bowel sounds + Extremities: no edema, no cyanosis    The results of significant diagnostics from this hospitalization (including imaging, microbiology, ancillary and laboratory) are listed below for reference.     Microbiology: Recent Results (from the past 240 hour(s))  MRSA Next Gen by PCR, Nasal     Status: None   Collection Time: 12/15/20  3:00 PM   Specimen: Nasal Mucosa; Nasal Swab  Result Value Ref Range Status   MRSA by PCR Next Gen NOT DETECTED NOT DETECTED Final    Comment: (NOTE) The GeneXpert MRSA Assay (FDA approved for NASAL specimens only), is one component of a comprehensive MRSA colonization  surveillance program. It is not intended to diagnose MRSA infection nor to guide or monitor treatment for MRSA infections. Test performance is not FDA approved in patients less than 76 years old. Performed at [facility], [place] [address] [place], [place] [address]   Surgical pcr screen     Status: None   Collection Time: 12/22/20  5:38 AM   Specimen: Nasal Mucosa; Nasal Swab  Result Value Ref Range Status   MRSA, PCR NEGATIVE NEGATIVE Final   Staphylococcus aureus NEGATIVE NEGATIVE Final    Comment: (NOTE) The Xpert SA Assay (FDA approved for NASAL specimens in patients 21 years of age and older), is one component of a comprehensive surveillance program. It is not intended to diagnose infection nor to guide or monitor treatment. Performed at [facility], [place] [address] [place], [place] [address]      Labs: BNP (last 3 results) Recent Labs    12/15/20 0817  BNP 9,629.5*   Basic Metabolic Panel: Recent Labs  Lab 12/19/20 0215 12/20/20 0217 12/21/20 0201 12/22/20 0040 12/23/20 0215  NA 136 138 134* 132* 135  K 3.4* 3.6 3.4* 3.4* 4.5  CL 96* 97* 96* 97* 102   CO2 29 23 27 27 26   GLUCOSE 101* 110* 84 87 128*  BUN 18 16 11  7* 8  CREATININE 0.97 1.29* 0.86 0.92 0.86  CALCIUM 9.2 10.4* 10.1 9.6 9.5  MG 2.3 2.3 2.2 2.0  --    Liver Function Tests: No results for input(s): AST, ALT, ALKPHOS, BILITOT, PROT, ALBUMIN in the last 168 hours. No results for input(s): LIPASE, AMYLASE in the last 168 hours. No results for input(s): AMMONIA in the last 168 hours. CBC: Recent Labs  Lab 12/20/20 0449 12/21/20 0201 12/22/20 0040 12/23/20 0215 12/24/20 0217  WBC 7.1 4.8 5.3 12.9* 9.2  HGB 9.5* 9.2* 8.2* 8.7* 8.2*  HCT 29.0* 28.5* 25.0* 27.5* 26.6*  MCV 96.0 96.3 95.8 98.9 100.0  PLT 313 292 256 255 263   Cardiac Enzymes: No results for input(s): CKTOTAL, CKMB, CKMBINDEX, TROPONINI in the last 168 hours. BNP: Invalid input(s): POCBNP CBG: No results for input(s): GLUCAP in the last 168 hours. D-Dimer No results for input(s): DDIMER in the last 72 hours. Hgb A1c No results for input(s): HGBA1C in the last 72 hours. Lipid Profile No results for input(s): CHOL, HDL, LDLCALC, TRIG, CHOLHDL, LDLDIRECT in the last 72 hours. Thyroid function studies No results for input(s): TSH, T4TOTAL, T3FREE, THYROIDAB in the last 72 hours.  Invalid input(s): FREET3 Anemia work up No results for input(s): VITAMINB12, FOLATE, FERRITIN, TIBC, IRON, RETICCTPCT in the last 72 hours. Urinalysis No results found for: COLORURINE, APPEARANCEUR, [place], [place], GLUCOSEU, [place], [place], [place], PROTEINUR, UROBILINOGEN, NITRITE, LEUKOCYTESUR Sepsis Labs Invalid input(s): PROCALCITONIN,  WBC,  LACTICIDVEN Microbiology Recent Results (from the past 240 hour(s))  MRSA Next Gen by PCR, Nasal     Status: None   Collection Time: 12/15/20  3:00 PM   Specimen: Nasal Mucosa; Nasal Swab  Result Value Ref Range Status   MRSA by PCR Next Gen NOT DETECTED NOT DETECTED Final    Comment: (NOTE) The GeneXpert MRSA Assay (FDA approved for NASAL specimens only), is one  component of a comprehensive MRSA colonization surveillance program. It is not intended to diagnose MRSA infection nor to guide or monitor treatment for MRSA infections. Test performance is not FDA approved in patients less than 57 years old. Performed at [facility], [place] ,  [place] [address]   Surgical pcr screen     Status: None   Collection Time: 12/22/20  5:38 AM   Specimen: Nasal Mucosa; Nasal Swab  Result Value Ref Range Status   MRSA, PCR NEGATIVE NEGATIVE Final   Staphylococcus aureus NEGATIVE NEGATIVE Final    Comment: (NOTE) The Xpert SA Assay (FDA approved for NASAL specimens in patients 91 years of age and older), is one component of a comprehensive surveillance program. It is not intended to diagnose infection nor to guide or monitor treatment. Performed at [facility], [place] [address], [place], [place] [address]      SIGNED:   Charlynne Cousins, MD  [facility] 12/25/2020, 12:36 PM Pager   If 7PM-7AM, please contact night-coverage [email or web address] Password [email or web address]

## 2020-12-26 ENCOUNTER — Other Ambulatory Visit: Payer: Self-pay

## 2020-12-26 DIAGNOSIS — R238 Other skin changes: Secondary | ICD-10-CM | POA: Diagnosis not present

## 2020-12-26 LAB — SURGICAL PATHOLOGY

## 2020-12-27 ENCOUNTER — Telehealth: Payer: Self-pay | Admitting: Hematology and Oncology

## 2020-12-27 NOTE — Telephone Encounter (Signed)
Scheduled appt per staff msg 8/25 from Dr. Lorenso Courier. Pt is aware of appt date and time.

## 2020-12-28 DIAGNOSIS — I5033 Acute on chronic diastolic (congestive) heart failure: Secondary | ICD-10-CM | POA: Diagnosis not present

## 2020-12-28 DIAGNOSIS — I2510 Atherosclerotic heart disease of native coronary artery without angina pectoris: Secondary | ICD-10-CM | POA: Diagnosis not present

## 2020-12-28 DIAGNOSIS — I480 Paroxysmal atrial fibrillation: Secondary | ICD-10-CM | POA: Diagnosis not present

## 2020-12-28 DIAGNOSIS — Z48812 Encounter for surgical aftercare following surgery on the circulatory system: Secondary | ICD-10-CM | POA: Diagnosis not present

## 2020-12-28 DIAGNOSIS — C180 Malignant neoplasm of cecum: Secondary | ICD-10-CM | POA: Diagnosis not present

## 2020-12-28 DIAGNOSIS — I214 Non-ST elevation (NSTEMI) myocardial infarction: Secondary | ICD-10-CM | POA: Diagnosis not present

## 2020-12-28 DIAGNOSIS — I110 Hypertensive heart disease with heart failure: Secondary | ICD-10-CM | POA: Diagnosis not present

## 2020-12-28 DIAGNOSIS — Z483 Aftercare following surgery for neoplasm: Secondary | ICD-10-CM | POA: Diagnosis not present

## 2020-12-28 DIAGNOSIS — D630 Anemia in neoplastic disease: Secondary | ICD-10-CM | POA: Diagnosis not present

## 2020-12-29 ENCOUNTER — Telehealth: Payer: Self-pay | Admitting: *Deleted

## 2020-12-29 NOTE — Telephone Encounter (Signed)
Received call from pt. She will be a new patient for Dr. Lorenso Courier as of 01/08/21. She is asking if we know what stage cancer she has. She had a right colectomy on 12/22/20 and knows shew has colon cancer.Advised that Dr. Lorenso Courier will advise on the exact stage but but path report and scans, there is no metastatic spread to any other organs and the 'margins' were clear on resection.explained what that means to pt.  Any other information and future treatment plans will be discussed with her by Dr. Lorenso Courier on her upcoming appt. Pt voiced understanding and grateful what information I was able to provide.

## 2021-01-02 DIAGNOSIS — Z48812 Encounter for surgical aftercare following surgery on the circulatory system: Secondary | ICD-10-CM | POA: Diagnosis not present

## 2021-01-02 DIAGNOSIS — I5033 Acute on chronic diastolic (congestive) heart failure: Secondary | ICD-10-CM | POA: Diagnosis not present

## 2021-01-02 DIAGNOSIS — Z483 Aftercare following surgery for neoplasm: Secondary | ICD-10-CM | POA: Diagnosis not present

## 2021-01-02 DIAGNOSIS — I214 Non-ST elevation (NSTEMI) myocardial infarction: Secondary | ICD-10-CM | POA: Diagnosis not present

## 2021-01-02 DIAGNOSIS — I480 Paroxysmal atrial fibrillation: Secondary | ICD-10-CM | POA: Diagnosis not present

## 2021-01-02 DIAGNOSIS — C180 Malignant neoplasm of cecum: Secondary | ICD-10-CM | POA: Diagnosis not present

## 2021-01-02 DIAGNOSIS — I2510 Atherosclerotic heart disease of native coronary artery without angina pectoris: Secondary | ICD-10-CM | POA: Diagnosis not present

## 2021-01-02 DIAGNOSIS — I110 Hypertensive heart disease with heart failure: Secondary | ICD-10-CM | POA: Diagnosis not present

## 2021-01-02 DIAGNOSIS — D630 Anemia in neoplastic disease: Secondary | ICD-10-CM | POA: Diagnosis not present

## 2021-01-03 ENCOUNTER — Telehealth: Payer: Self-pay | Admitting: Cardiovascular Disease

## 2021-01-03 NOTE — Telephone Encounter (Signed)
Alexandria French is calling requesting to speak with Lattie Haw in regards to some questions she has since leaving the hospital. She states it's about how to take her medications in certain situations that have to do with her BP.

## 2021-01-03 NOTE — Telephone Encounter (Signed)
Returned the call to the patient. She had recently been discharged from the hospital after a 12 day stay. During the stay her bisoprolol and diltiazem were discontinued. She was started on Midodrine 10 mg tid and Metoprolol 50 bid.  She stated that last night her heart rate was in the 40's multiple times when she checked it. She feels like her pacemaker is set at 60. She has been advised to send a transmission but was not sure how. Message sent to the device clinic for assistance.

## 2021-01-03 NOTE — Telephone Encounter (Signed)
Transmission received. 01/03/2021.

## 2021-01-03 NOTE — Telephone Encounter (Signed)
Remote transmission reviewed. No abnormal readings. LRL is 60. Discussed with patient that BP cuff pulse and/or pulse ox/smart watch/activity tracker is not 100% accurate. Provided reassurance. Patient feels well. Provided patient with device clinic contact and encouraged her to monitor and call with any additional concerns. Patient appreciative of reassurance.

## 2021-01-04 DIAGNOSIS — Z48812 Encounter for surgical aftercare following surgery on the circulatory system: Secondary | ICD-10-CM | POA: Diagnosis not present

## 2021-01-04 DIAGNOSIS — Z483 Aftercare following surgery for neoplasm: Secondary | ICD-10-CM | POA: Diagnosis not present

## 2021-01-04 DIAGNOSIS — I480 Paroxysmal atrial fibrillation: Secondary | ICD-10-CM | POA: Diagnosis not present

## 2021-01-04 DIAGNOSIS — I110 Hypertensive heart disease with heart failure: Secondary | ICD-10-CM | POA: Diagnosis not present

## 2021-01-04 DIAGNOSIS — I2510 Atherosclerotic heart disease of native coronary artery without angina pectoris: Secondary | ICD-10-CM | POA: Diagnosis not present

## 2021-01-04 DIAGNOSIS — D630 Anemia in neoplastic disease: Secondary | ICD-10-CM | POA: Diagnosis not present

## 2021-01-04 DIAGNOSIS — I5033 Acute on chronic diastolic (congestive) heart failure: Secondary | ICD-10-CM | POA: Diagnosis not present

## 2021-01-04 DIAGNOSIS — I214 Non-ST elevation (NSTEMI) myocardial infarction: Secondary | ICD-10-CM | POA: Diagnosis not present

## 2021-01-04 DIAGNOSIS — C180 Malignant neoplasm of cecum: Secondary | ICD-10-CM | POA: Diagnosis not present

## 2021-01-08 ENCOUNTER — Inpatient Hospital Stay: Payer: Medicare PPO

## 2021-01-08 ENCOUNTER — Inpatient Hospital Stay: Payer: Medicare PPO | Attending: Hematology and Oncology | Admitting: Hematology and Oncology

## 2021-01-08 ENCOUNTER — Other Ambulatory Visit: Payer: Self-pay

## 2021-01-08 ENCOUNTER — Other Ambulatory Visit: Payer: Self-pay | Admitting: Hematology and Oncology

## 2021-01-08 VITALS — BP 155/51 | HR 52 | Temp 97.2°F | Resp 18 | Wt 132.6 lb

## 2021-01-08 DIAGNOSIS — D500 Iron deficiency anemia secondary to blood loss (chronic): Secondary | ICD-10-CM | POA: Diagnosis not present

## 2021-01-08 DIAGNOSIS — C187 Malignant neoplasm of sigmoid colon: Secondary | ICD-10-CM

## 2021-01-08 DIAGNOSIS — Z85038 Personal history of other malignant neoplasm of large intestine: Secondary | ICD-10-CM

## 2021-01-08 DIAGNOSIS — Z9049 Acquired absence of other specified parts of digestive tract: Secondary | ICD-10-CM

## 2021-01-08 DIAGNOSIS — C189 Malignant neoplasm of colon, unspecified: Secondary | ICD-10-CM | POA: Insufficient documentation

## 2021-01-08 LAB — CMP (CANCER CENTER ONLY)
ALT: 18 U/L (ref 0–44)
AST: 28 U/L (ref 15–41)
Albumin: 4.6 g/dL (ref 3.5–5.0)
Alkaline Phosphatase: 69 U/L (ref 38–126)
Anion gap: 10 (ref 5–15)
BUN: 9 mg/dL (ref 8–23)
CO2: 26 mmol/L (ref 22–32)
Calcium: 10.9 mg/dL — ABNORMAL HIGH (ref 8.9–10.3)
Chloride: 107 mmol/L (ref 98–111)
Creatinine: 0.89 mg/dL (ref 0.44–1.00)
GFR, Estimated: >60 mL/min (ref >60)
Glucose, Bld: 91 mg/dL (ref 70–99)
Potassium: 3.8 mmol/L (ref 3.5–5.1)
Sodium: 143 mmol/L (ref 135–145)
Total Bilirubin: 0.5 mg/dL (ref 0.3–1.2)
Total Protein: 7.2 g/dL (ref 6.5–8.1)

## 2021-01-08 LAB — CBC WITH DIFFERENTIAL (CANCER CENTER ONLY)
Abs Immature Granulocytes: 0.01 10*3/uL (ref 0.00–0.07)
Basophils Absolute: 0 10*3/uL (ref 0.0–0.1)
Basophils Relative: 1 %
Eosinophils Absolute: 0.1 10*3/uL (ref 0.0–0.5)
Eosinophils Relative: 3 %
HCT: 34.2 % — ABNORMAL LOW (ref 36.0–46.0)
Hemoglobin: 10.7 g/dL — ABNORMAL LOW (ref 12.0–15.0)
Immature Granulocytes: 0 %
Lymphocytes Relative: 20 %
Lymphs Abs: 0.9 10*3/uL (ref 0.7–4.0)
MCH: 30.7 pg (ref 26.0–34.0)
MCHC: 31.3 g/dL (ref 30.0–36.0)
MCV: 98 fL (ref 80.0–100.0)
Monocytes Absolute: 0.3 10*3/uL (ref 0.1–1.0)
Monocytes Relative: 7 %
Neutro Abs: 3.2 10*3/uL (ref 1.7–7.7)
Neutrophils Relative %: 69 %
Platelet Count: 246 10*3/uL (ref 150–400)
RBC: 3.49 MIL/uL — ABNORMAL LOW (ref 3.87–5.11)
RDW: 13.4 % (ref 11.5–15.5)
WBC Count: 4.6 10*3/uL (ref 4.0–10.5)
nRBC: 0 % (ref 0.0–0.2)

## 2021-01-08 LAB — RETIC PANEL
Immature Retic Fract: 14.8 % (ref 2.3–15.9)
RBC.: 3.43 MIL/uL — ABNORMAL LOW (ref 3.87–5.11)
Retic Count, Absolute: 87.1 10*3/uL (ref 19.0–186.0)
Retic Ct Pct: 2.5 % (ref 0.4–3.1)
Reticulocyte Hemoglobin: 33 pg (ref >27.9)

## 2021-01-08 LAB — IRON AND TIBC
Iron: 61 ug/dL (ref 41–142)
Saturation Ratios: 15 % — ABNORMAL LOW (ref 21–57)
TIBC: 398 ug/dL (ref 236–444)
UIBC: 336 ug/dL (ref 120–384)

## 2021-01-08 LAB — FERRITIN: Ferritin: 457 ng/mL — ABNORMAL HIGH (ref 11–307)

## 2021-01-08 NOTE — Progress Notes (Signed)
[place] [phone]   Fax:([phone] [place] NOTE  Patient Care Team: Bluefield Nation, MD as PCP - General (Internal Medicine) Croitoru, Dani Gobble, MD as PCP - Cardiology (Cardiology)  Hematological/Oncological History # Adenocarcinoma of the Colon, pT2, N0, M0-Stage I. S/p Resection 12/20/2020: colonoscopy finds a sessile, ulcerated non-obstructing mass in the cecum. Biopsy performed, consistent with adenocarcinoma of the colon.  12/22/2020: laparoscopic assistance partial colectomy performed. Pathology shows a pT2N0 adenocarcinoma of the colon, consistent with Stage I.  01/08/2021: Establish care with Dr. Lorenso Courier  CHIEF COMPLAINTS/PURPOSE OF CONSULTATION:  "Adenocarcinoma of the Colon, "  HISTORY OF PRESENTING ILLNESS:  Alexandria French 69 y.o. female with medical history significant for iron deficiency anemia, hypertension, internal hemorrhoids, sick sinus syndrome, and diverticulosis presents for evaluation of newly diagnosed adenocarcinoma of the colon.  On review of the previous records Alexandria French underwent a colonoscopy on 12/20/2020 at which time a sessile ulcerated nonobstructing mass in the cecum was found.  Biopsy was performed consistent with adenocarcinoma the colon.  On 12/22/2020 a laparoscopic assisted partial colectomy was performed.  Pathology showed a pT2N0 adenocarcinoma of the colon consistent with stage I disease.  Due to concern for this finding that patient was referred to oncology for further evaluation and management.  On exam today Alexandria French is accompanied by her husband.  She notes that she has been well in the interim since the surgery.  She notes that she has not had any issues with bleeding, dark stools, or abdominal pain.  Laparoscopic incision sites are healing well.  She reports that she does have low appetite and she is "not hungry".  She notes that her bowel movements are solid and she is not having any other GI upset.   She is gained approximately 1 to 2 pounds in the interim since the surgery.  She currently denies any issues with fevers, chills, sweats, nausea, vomiting or diarrhea.  A full 10 point ROS is listed below.  MEDICAL HISTORY:  Past Medical History:  Diagnosis Date   A-fib [facility])    CAD (coronary artery disease)    Diverticulosis    Esophageal reflux    no meds   External hemorrhoids    Gastritis    Hypertension    IDA (iron deficiency anemia)    Internal hemorrhoids    Other and unspecified hyperlipidemia    SSS (sick sinus syndrome) ([place])     SURGICAL HISTORY: Past Surgical History:  Procedure Laterality Date   ABDOMINAL HYSTERECTOMY  08/2008   BIOPSY  12/20/2020   Procedure: BIOPSY;  Surgeon: Gatha Mayer, MD;  Location: [facility] ENDOSCOPY;  Service: Endoscopy;;   BLADDER REPAIR     COLONOSCOPY WITH PROPOFOL N/A 12/20/2020   Procedure: COLONOSCOPY WITH PROPOFOL;  Surgeon: Gatha Mayer, MD;  Location: [facility] ENDOSCOPY;  Service: Endoscopy;  Laterality: N/A;   LAPAROSCOPIC PARTIAL COLECTOMY N/A 12/22/2020   Procedure: LAPAROSCOPIC ASSISTED PARTIAL COLECTOMY;  Surgeon: Coralie Keens, MD;  Location: [place];  Service: General;  Laterality: N/A;   PACEMAKER IMPLANT N/A 02/28/2020   Procedure: PACEMAKER IMPLANT;  Surgeon: Sanda Klein, MD;  Location: [place] CV LAB;  Service: Cardiovascular;  Laterality: N/A;   RIGHT/LEFT HEART CATH AND CORONARY ANGIOGRAPHY N/A 12/15/2020   Procedure: RIGHT/LEFT HEART CATH AND CORONARY ANGIOGRAPHY;  Surgeon: Leonie Man, MD;  Location: [place] CV LAB;  Service: Cardiovascular;  Laterality: N/A;   TONSILLECTOMY      SOCIAL HISTORY: Social History   Socioeconomic  History   Marital status: Married    Spouse name: Not on file   Number of children: Not on file   Years of education: Not on file   Highest education level: Not on file  Occupational History   Not on file  Tobacco Use   Smoking status: Never   Smokeless tobacco: Never   Vaping Use   Vaping Use: Never used  Substance and Sexual Activity   Alcohol use: No    Alcohol/week: 0.0 standard drinks   Drug use: No   Sexual activity: Not on file  Other Topics Concern   Not on file  Social History Narrative   Not on file   Social Determinants of Health   Financial Resource Strain: Not on file  Food Insecurity: Not on file  Transportation Needs: Not on file  Physical Activity: Not on file  Stress: Not on file  Social Connections: Not on file  Intimate Partner Violence: Not on file    FAMILY HISTORY: Family History  Problem Relation Age of Onset   Heart disease Other    Osteoporosis Other    Arthritis Other    Colon cancer Neg Hx    Colon polyps Neg Hx     ALLERGIES:  is allergic to cefzil [cefprozil].  MEDICATIONS:  Current Outpatient Medications  Medication Sig Dispense Refill   acetaminophen (TYLENOL) 500 MG tablet Take 2 tablets (1,000 mg total) by mouth every 6 (six) hours as needed for fever or mild pain. 30 tablet 0   apixaban (ELIQUIS) 5 MG TABS tablet TAKE 1 TABLET(5 MG) BY MOUTH TWICE DAILY (Patient taking differently: Take 5 mg by mouth 2 (two) times daily.) 60 tablet 5   Calcium Carbonate-Vitamin D 600-400 MG-UNIT tablet Take 1 tablet by mouth 2 (two) times daily.      cetirizine (ZYRTEC) 10 MG tablet Take 10 mg by mouth every evening.      ferrous sulfate 325 (65 FE) MG EC tablet Take 325 mg by mouth in the morning and at bedtime.     LORazepam (ATIVAN) 1 MG tablet Take 0.5 mg by mouth See admin instructions. Every other night     metoprolol tartrate (LOPRESSOR) 50 MG tablet Take 1 tablet (50 mg total) by mouth 2 (two) times daily. 60 tablet 3   midodrine (PROAMATINE) 10 MG tablet Take 1 tablet (10 mg total) by mouth 3 (three) times daily with meals. 90 tablet 3   simvastatin (ZOCOR) 20 MG tablet Take 20 mg by mouth daily.     Sodium Sulfate-Mag Sulfate-KCl (SUTAB) [phone] MG TABS Use as directed for colonoscopy. MANUFACTURER  CODES!! BIN: [id number] PCN: CN GROUP: [id number] MEMBER ID: [id number];CNO AS SECONDARY INSURANCE ;NO PRIOR AUTHORIZATION 24 tablet 0   No current facility-administered medications for this visit.    REVIEW OF SYSTEMS:   Constitutional: ( - ) fevers, ( - )  chills , ( - ) night sweats Eyes: ( - ) blurriness of vision, ( - ) double vision, ( - ) watery eyes Ears, nose, mouth, throat, and face: ( - ) mucositis, ( - ) sore throat Respiratory: ( - ) cough, ( - ) dyspnea, ( - ) wheezes Cardiovascular: ( - ) palpitation, ( - ) chest discomfort, ( - ) lower extremity swelling Gastrointestinal:  ( - ) nausea, ( - ) heartburn, ( - ) change in bowel habits Skin: ( - ) abnormal skin rashes Lymphatics: ( - ) new lymphadenopathy, ( - ) easy bruising Neurological: ( - )  numbness, ( - ) tingling, ( - ) new weaknesses Behavioral/Psych: ( - ) mood change, ( - ) new changes  All other systems were reviewed with the patient and are negative.  PHYSICAL EXAMINATION: ECOG PERFORMANCE STATUS: 1 - Symptomatic but completely ambulatory  Vitals:   01/08/21 1343  BP: (!) 155/51  Pulse: (!) 52  Resp: 18  Temp: (!) 97.2 F (36.2 C)  SpO2: 100%   Filed Weights   01/08/21 1343  Weight: 132 lb 9 oz (60.1 kg)    GENERAL: well appearing elderly Caucasian female in NAD  SKIN: skin color, texture, turgor are normal, no rashes or significant lesions EYES: conjunctiva are pink and non-injected, sclera clear LUNGS: clear to auscultation and percussion with normal breathing effort HEART: regular rate & rhythm and no murmurs and no lower extremity edema ABDOMEN: soft, non-tender, non-distended.  Well-healing laparoscopic incision sites. Musculoskeletal: no cyanosis of digits and no clubbing  PSYCH: alert & oriented x 3, fluent speech NEURO: no focal motor/sensory deficits  LABORATORY DATA:  I have reviewed the data as listed CBC Latest Ref Rng & Units 01/08/2021 12/24/2020 12/23/2020  WBC 4.0 - 10.5 K/uL 4.6 9.2  12.9(H)  Hemoglobin 12.0 - 15.0 g/dL 10.7(L) 8.2(L) 8.7(L)  Hematocrit 36.0 - 46.0 % 34.2(L) 26.6(L) 27.5(L)  Platelets 150 - 400 K/uL 246 263 255    CMP Latest Ref Rng & Units 01/08/2021 12/23/2020 12/22/2020  Glucose 70 - 99 mg/dL 91 128(H) 87  BUN 8 - 23 mg/dL 9 8 7(L)  Creatinine 0.44 - 1.00 mg/dL 0.89 0.86 0.92  Sodium 135 - 145 mmol/L 143 135 132(L)  Potassium 3.5 - 5.1 mmol/L 3.8 4.5 3.4(L)  Chloride 98 - 111 mmol/L 107 102 97(L)  CO2 22 - 32 mmol/L 26 26 27   Calcium 8.9 - 10.3 mg/dL 10.9(H) 9.5 9.6  Total Protein 6.5 - 8.1 g/dL 7.2 - -  Total Bilirubin 0.3 - 1.2 mg/dL 0.5 - -  Alkaline Phos 38 - 126 U/L 69 - -  AST 15 - 41 U/L 28 - -  ALT 0 - 44 U/L 18 - -    RADIOGRAPHIC STUDIES: DG Chest 2 View  Result Date: 12/14/2020 CLINICAL DATA:  Chest pain EXAM: CHEST - 2 VIEW COMPARISON:  10/18/2020 FINDINGS: Left-sided pacing device. Mild cardiomegaly without focal opacity or pleural effusion. Mild diffuse interstitial opacity, possible low-grade edema. Probable underlying bronchitic changes. Aortic atherosclerosis IMPRESSION: Cardiomegaly with probable mild vascular congestion interstitial edema. Electronically Signed   By: Donavan Foil M.D.   On: 12/14/2020 22:12   CT CHEST WO CONTRAST  Result Date: 12/18/2020 CLINICAL DATA:  Acute hypoxic respiratory failure secondary to decompensated diastolic heart failure. EXAM: CT CHEST WITHOUT CONTRAST TECHNIQUE: Multidetector CT imaging of the chest was performed following the standard protocol without IV contrast. COMPARISON:  December 15, 2020. FINDINGS: Cardiovascular: Atherosclerosis of thoracic aorta is noted without aneurysm formation. Mild cardiomegaly is noted. Left-sided pacemaker is noted. Mediastinum/Nodes: Moderate size sliding-type hiatal hernia is noted. No adenopathy is noted. Thyroid gland is unremarkable. Lungs/Pleura: No pneumothorax is noted. Small bilateral pleural effusions are noted with adjacent subsegmental atelectasis  of both lower lobes. Mild septal thickening is noted in both upper lobes concerning for pulmonary edema. Upper Abdomen: No acute abnormality. Musculoskeletal: No chest wall mass or suspicious bone lesions identified. IMPRESSION: Small bilateral pleural effusions are noted with adjacent subsegmental atelectasis of both lower lobes. Mild septal thickening is noted in both upper lobes concerning for pulmonary edema. Moderate size sliding-type  hiatal hernia. Aortic Atherosclerosis (ICD10-I70.0). Electronically Signed   By: Marijo Conception M.D.   On: 12/18/2020 10:13   CT Angio Chest Pulmonary Embolism (PE) W or WO Contrast  Result Date: 12/15/2020 CLINICAL DATA:  Palpitations with dizziness and lightheadedness. EXAM: CT ANGIOGRAPHY CHEST WITH CONTRAST TECHNIQUE: Multidetector CT imaging of the chest was performed using the standard protocol during bolus administration of intravenous contrast. Multiplanar CT image reconstructions and MIPs were obtained to evaluate the vascular anatomy. CONTRAST:  168mL OMNIPAQUE IOHEXOL 350 MG/ML SOLN COMPARISON:  December 01, 2018 FINDINGS: Cardiovascular: Satisfactory opacification of the pulmonary arteries to the segmental level. No evidence of pulmonary embolism. There is mild cardiomegaly mild coronary artery calcification. No pericardial effusion. Mediastinum/Nodes: No enlarged mediastinal, hilar, or axillary lymph nodes. Peribronchial thickening is seen involving the bilateral mainstem bronchi and multiple proximal branches. The thyroid gland and esophagus demonstrate no significant findings. Lungs/Pleura: Markedly severity interstitial thickening is noted throughout both lungs. Mild to moderate severity atelectasis and/or infiltrate is also seen within the left upper lobe and bilateral lower lobes. There are small bilateral pleural effusions. No pneumothorax is identified. Upper Abdomen: A moderate to large hiatal hernia is seen. Musculoskeletal: Degenerative changes seen  throughout the thoracic spine. Review of the MIP images confirms the above findings. IMPRESSION: 1. No CT evidence of acute pulmonary embolism. 2. Markedly severity interstitial thickening throughout both lungs, consistent with pulmonary edema. 3. Mild to moderate severity left upper lobe and bilateral lower lobe atelectasis and/or infiltrate. 4. Small bilateral pleural effusions. 5. Moderate to large hiatal hernia. Electronically Signed   By: Virgina Norfolk M.D.   On: 12/15/2020 02:37   CT ABDOMEN PELVIS W CONTRAST  Result Date: 12/22/2020 CLINICAL DATA:  Probable abdominal mass. EXAM: CT ABDOMEN AND PELVIS WITH CONTRAST TECHNIQUE: Multidetector CT imaging of the abdomen and pelvis was performed using the standard protocol following bolus administration of intravenous contrast. CONTRAST:  71mL OMNIPAQUE IOHEXOL 350 MG/ML SOLN COMPARISON:  None. FINDINGS: Lower chest: Trace bilateral pleural effusions with minimal bibasilar atelectasis. Cardiac pacemaker wires noted. No intra-abdominal free air or free fluid. Hepatobiliary: Focal area of calcification of the posterior aspect of the inferior right liver capsule likely sequela of prior insult. The liver is otherwise unremarkable. No intrahepatic biliary ductal dilatation. The gallbladder is unremarkable. Pancreas: Unremarkable. No pancreatic ductal dilatation or surrounding inflammatory changes. Spleen: Normal in size without focal abnormality. Adrenals/Urinary Tract: The adrenal glands unremarkable. There is a 15 mm left renal upper pole cyst. There is no hydronephrosis on either side. The visualized ureters and urinary bladder appear unremarkable. Stomach/Bowel: There is colonic diverticulosis without active inflammatory changes. Small hiatal hernia. There is no bowel obstruction or active inflammation. The appendix is normal. Vascular/Lymphatic: Moderate aortoiliac atherosclerotic disease. The IVC is unremarkable. No portal venous gas. There is no  adenopathy. Reproductive: Hysterectomy.  No adnexal masses. Other: None Musculoskeletal: Degenerative changes of the spine. No acute osseous pathology. IMPRESSION: 1. No acute intra-abdominal or pelvic pathology. 2. Colonic diverticulosis. No bowel obstruction. Normal appendix. 3. Trace bilateral pleural effusions. 4. Aortic Atherosclerosis (ICD10-I70.0). Electronically Signed   By: Anner Crete M.D.   On: 12/22/2020 01:54   CARDIAC CATHETERIZATION  Result Date: 2/67/1245   LV end diastolic pressure is severely elevated.   Hemodynamic findings consistent with mild pulmonary hypertension. SUMMARY Angiographically Normal Coronary arteries with a left dominant system. Significant LVOT gradient with apical LV pressures of 184/12 mmHg with an EDP of 25, mid LV cavity 157/9 mmHg with EDP of  25 and outflow tract pressure of 111/9 mmHg with a stable EDP of 24 mmHg. Mild Secondary Pulmonary Hypertension with mean PAP of 29 mmHg and PCWP of 29 mmHg. Moderately reduced cardiac function with a cardiac output-index of 3.26 and 2.02 by Fick and 3.55-2.2 by thermodilution. RECOMMENDATIONS Gentle titration of medications to allow for diuresis without leading to systemic hypotension. Defer decision making to rounding cardiology service. Glenetta Hew, MD  DG CHEST PORT 1 VIEW  Result Date: 12/17/2020 CLINICAL DATA:  Shortness of breath EXAM: PORTABLE CHEST 1 VIEW COMPARISON:  Chest x-ray December 14, 2020. FINDINGS: Pacemaker is stable. The cardiomediastinal silhouette is stable. No pneumothorax. Diffuse interstitial opacities. Increasing haziness in the right base. Increasing opacity in the left retrocardiac region with obscuration left hemidiaphragm. Probable small layering effusion. IMPRESSION: 1. Cardiomegaly and pulmonary edema. 2. Increasing haziness over the right base may represent layering effusion with underlying atelectasis. Developing infiltrate not excluded. 3. Increasing opacity and probable small  associated effusion in the left base/retrocardiac region. The opacity may represent atelectasis. Recommend attention on follow-up. Electronically Signed   By: Dorise Bullion III M.D.   On: 12/17/2020 08:35   ECHOCARDIOGRAM COMPLETE  Result Date: 12/15/2020    ECHOCARDIOGRAM REPORT   Patient Name:   Alexandria French Date of Exam: 12/15/2020 Medical Rec #:  [id number]     Height:       62.0 in Accession #:    [id number]    Weight:       135.0 lb Date of Birth:  [birth year]    BSA:          1.618 m Patient Age:    73 years      BP:           108/56 mmHg Patient Gender: F             HR:           76 bpm. Exam Location:  Inpatient Procedure: 2D Echo, Cardiac Doppler and Color Doppler                                MODIFIED REPORT:     This report was modified by Rudean Haskell MD on 12/15/2020 due to                                    Spelling.  Indications:     Acute MI  History:         Patient has prior history of Echocardiogram examinations, most                  recent 08/27/2019. Acute MI, Arrythmias:LBBB; Risk                  Factors:Hypertension.  Sonographer:     MH Referring Phys:  [id number] Stevan Born MARTIN Diagnosing Phys: Rudean Haskell MD IMPRESSIONS  1. Left ventricular ejection fraction, by estimation, is 55-60%. The left ventricle demonstrates regional wall motion abnormalities that are consistent with a left bundle branch block.There is severe asymmetric left ventricular hypertrophy. Left ventricular diastolic parameters are consistent with Grade I diastolic dysfunction (impaired relaxation). No significant LVOT Obstructive seen in this study.  2. Right ventricular systolic function is normal. The right ventricular size is normal. There is normal pulmonary artery systolic pressure.  3. The mitral valve is normal in structure. Mild  mitral valve regurgitation. No evidence of mitral stenosis.  4. The aortic valve was not well visualized. Aortic valve regurgitation is trivial. No aortic  stenosis is present.  5. The inferior vena cava is normal in size with <50% respiratory variability, suggesting right atrial pressure of 8 mmHg.  6. Technically difficult acquisition; in future studies consider use of echo-contrast. Comparison(s): A prior study was performed on 08/27/19. Prior images reviewed side by side. Compared to prior septal wall motion is new. LVOT obstructive more prominent on prior study. Conclusion(s)/Recommendation(s): Consider cardiac MRI for assessment of hypertrophic cardioymyopathy. FINDINGS  Left Ventricle: Left ventricular ejection fraction, by estimation, is 55 to 60%. The left ventricle has normal function. The left ventricle demonstrates regional wall motion abnormalities. The left ventricular internal cavity size was small. There is severe asymmetric left ventricular hypertrophy. Abnormal (paradoxical) septal motion, consistent with left bundle branch block. Left ventricular diastolic parameters are consistent with Grade I diastolic dysfunction (impaired relaxation). Right Ventricle: The right ventricular size is normal. Right vetricular wall thickness was not well visualized. Right ventricular systolic function is normal. There is normal pulmonary artery systolic pressure. The tricuspid regurgitant velocity is 1.60 m/s, and with an assumed right atrial pressure of 8 mmHg, the estimated right ventricular systolic pressure is 12.8 mmHg. Left Atrium: Left atrial size was normal in size. Right Atrium: Right atrial size was not well visualized. Pericardium: There is no evidence of pericardial effusion. Mitral Valve: The mitral valve is normal in structure. Mild mitral valve regurgitation. No evidence of mitral valve stenosis. Tricuspid Valve: The tricuspid valve is grossly normal. Tricuspid valve regurgitation is trivial. No evidence of tricuspid stenosis. Aortic Valve: The aortic valve was not well visualized. Aortic valve regurgitation is trivial. No aortic stenosis is present.  Aortic valve mean gradient measures 3.5 mmHg. Aortic valve peak gradient measures 13.7 mmHg. Aortic valve area, by VTI measures 2.92 cm. Pulmonic Valve: The pulmonic valve was not well visualized. Pulmonic valve regurgitation is not visualized. Aorta: The aortic root and ascending aorta are structurally normal, with no evidence of dilitation. Venous: The inferior vena cava is normal in size with less than 50% respiratory variability, suggesting right atrial pressure of 8 mmHg. IAS/Shunts: The atrial septum is grossly normal.  LEFT VENTRICLE PLAX 2D LVIDd:         3.70 cm     Diastology LVIDs:         2.40 cm     LV e' medial:  7.07 cm/s LV PW:         1.80 cm     LV e' lateral: 7.18 cm/s LV IVS:        1.50 cm LVOT diam:     1.90 cm LV SV:         217 LV SV Index:   134 LVOT Area:     2.84 cm  LV Volumes (MOD) LV vol d, MOD A4C: 25.7 ml LV vol s, MOD A4C: 7.6 ml LV SV MOD A4C:     25.7 ml RIGHT VENTRICLE RV S prime:     6.20 cm/s TAPSE (M-mode): 2.0 cm LEFT ATRIUM           Index LA diam:      3.50 cm 2.16 cm/m LA Vol (A2C): 22.2 ml 13.72 ml/m LA Vol (A4C): 35.1 ml 21.70 ml/m  AORTIC VALVE AV Area (Vmax):    3.77 cm AV Area (Vmean):   2.70 cm AV Area (VTI):     2.92 cm AV  Vmax:           185.00 cm/s AV Vmean:          195.400 cm/s AV VTI:            0.743 m AV Peak Grad:      13.7 mmHg AV Mean Grad:      3.5 mmHg LVOT Vmax:         246.00 cm/s LVOT Vmean:        186.000 cm/s LVOT VTI:          0.764 m LVOT/AV VTI ratio: 1.03  AORTA Ao Root diam: 3.00 cm Ao Asc diam:  2.70 cm MR Peak grad: 47.3 mmHg   TRICUSPID VALVE MR Vmax:      344.00 cm/s TR Peak grad:   10.2 mmHg                           TR Vmax:        160.00 cm/s                            SHUNTS                           Systemic VTI:  0.76 m                           Systemic Diam: 1.90 cm Rudean Haskell MD Electronically signed by Rudean Haskell MD Signature Date/Time: 12/15/2020/12:52:48 PM    Final (Updated)     ASSESSMENT &  PLAN Alexandria French 70 y.o. female with medical history significant for iron deficiency anemia, hypertension, internal hemorrhoids, sick sinus syndrome, and diverticulosis presents for evaluation of newly diagnosed adenocarcinoma of the colon.  After review of the labs, review of the records, and discussion with the patient the patients findings are most consistent with stage I adenocarcinoma of the colon status post resection.  At this time we will follow the NCCN guidelines which recommend a colonoscopy to be repeated in 1 years time.  There is no clear indication for routine follow-up in oncology clinic with labs or imaging.  Would recommend we see the patient back in approximately 1 year in order to assure that she undergoes a repeat colonoscopy as recommended.  Additionally she does not have iron deficiency anemia prior to the surgery, however now her hemoglobin is uptrending currently at 10.7 while on p.o. iron therapy.  # Adenocarcinoma of the Colon, pT2, N0, M0-Stage I. S/p Resection --No evidence of metastatic disease on imaging of the chest or abdomen.  Pathology confirmed a PT2N0 disease which is consistent with stage I --Recommend repeat colonoscopy in 1 years time --Have the patient return to clinic in 1 year in order to assure she is having adequate follow-up with colonoscopy.  No need for routine follow-ups or imaging other than this  # Iron deficiency anemia secondary to colon cancer --Patient is currently taking p.o. iron 325 mg daily --Continue p.o. iron therapy until hemoglobin is replete --Hemoglobin trending upward currently at 10.7  No orders of the defined types were placed in this encounter.   All questions were answered. The patient knows to call the clinic with any problems, questions or concerns.  A total of more than 60 minutes were spent on this encounter with face-to-face time and non-face-to-face time, including preparing to  see the patient, ordering tests and/or  medications, counseling the patient and coordination of care as outlined above.   Ledell Peoples, MD Department of Hematology/Oncology [place] at [facility] Phone: [phone] Pager: [phone] Email: [email or web address]  01/08/2021 4:27 PM

## 2021-01-09 DIAGNOSIS — Z483 Aftercare following surgery for neoplasm: Secondary | ICD-10-CM | POA: Diagnosis not present

## 2021-01-09 DIAGNOSIS — I2510 Atherosclerotic heart disease of native coronary artery without angina pectoris: Secondary | ICD-10-CM | POA: Diagnosis not present

## 2021-01-09 DIAGNOSIS — Z48812 Encounter for surgical aftercare following surgery on the circulatory system: Secondary | ICD-10-CM | POA: Diagnosis not present

## 2021-01-09 DIAGNOSIS — C180 Malignant neoplasm of cecum: Secondary | ICD-10-CM | POA: Diagnosis not present

## 2021-01-09 DIAGNOSIS — I110 Hypertensive heart disease with heart failure: Secondary | ICD-10-CM | POA: Diagnosis not present

## 2021-01-09 DIAGNOSIS — I5033 Acute on chronic diastolic (congestive) heart failure: Secondary | ICD-10-CM | POA: Diagnosis not present

## 2021-01-09 DIAGNOSIS — I480 Paroxysmal atrial fibrillation: Secondary | ICD-10-CM | POA: Diagnosis not present

## 2021-01-09 DIAGNOSIS — I214 Non-ST elevation (NSTEMI) myocardial infarction: Secondary | ICD-10-CM | POA: Diagnosis not present

## 2021-01-09 DIAGNOSIS — D630 Anemia in neoplastic disease: Secondary | ICD-10-CM | POA: Diagnosis not present

## 2021-01-11 DIAGNOSIS — I5033 Acute on chronic diastolic (congestive) heart failure: Secondary | ICD-10-CM | POA: Diagnosis not present

## 2021-01-11 DIAGNOSIS — C180 Malignant neoplasm of cecum: Secondary | ICD-10-CM | POA: Diagnosis not present

## 2021-01-11 DIAGNOSIS — I214 Non-ST elevation (NSTEMI) myocardial infarction: Secondary | ICD-10-CM | POA: Diagnosis not present

## 2021-01-11 DIAGNOSIS — Z483 Aftercare following surgery for neoplasm: Secondary | ICD-10-CM | POA: Diagnosis not present

## 2021-01-11 DIAGNOSIS — Z48812 Encounter for surgical aftercare following surgery on the circulatory system: Secondary | ICD-10-CM | POA: Diagnosis not present

## 2021-01-11 DIAGNOSIS — I110 Hypertensive heart disease with heart failure: Secondary | ICD-10-CM | POA: Diagnosis not present

## 2021-01-11 DIAGNOSIS — I2510 Atherosclerotic heart disease of native coronary artery without angina pectoris: Secondary | ICD-10-CM | POA: Diagnosis not present

## 2021-01-11 DIAGNOSIS — I480 Paroxysmal atrial fibrillation: Secondary | ICD-10-CM | POA: Diagnosis not present

## 2021-01-11 DIAGNOSIS — D630 Anemia in neoplastic disease: Secondary | ICD-10-CM | POA: Diagnosis not present

## 2021-01-17 ENCOUNTER — Encounter: Payer: Self-pay | Admitting: Student

## 2021-01-17 ENCOUNTER — Ambulatory Visit (INDEPENDENT_AMBULATORY_CARE_PROVIDER_SITE_OTHER): Payer: Medicare PPO

## 2021-01-17 ENCOUNTER — Ambulatory Visit: Payer: Medicare PPO | Admitting: Student

## 2021-01-17 ENCOUNTER — Other Ambulatory Visit: Payer: Self-pay

## 2021-01-17 VITALS — BP 124/78 | HR 65 | Temp 98.2°F | Ht 62.0 in | Wt 134.0 lb

## 2021-01-17 DIAGNOSIS — J90 Pleural effusion, not elsewhere classified: Secondary | ICD-10-CM

## 2021-01-17 DIAGNOSIS — R0609 Other forms of dyspnea: Secondary | ICD-10-CM

## 2021-01-17 DIAGNOSIS — I421 Obstructive hypertrophic cardiomyopathy: Secondary | ICD-10-CM | POA: Diagnosis not present

## 2021-01-17 NOTE — Progress Notes (Signed)
Synopsis: Referred for dyspnea on exertion by [facility] Nation, MD  Subjective:   [name] GENDER: female DOB: 09-Dec-1950, MRN: [id number]  Chief Complaint  Patient presents with   Consult    Follow up on hospital visit.   69yF with history of HOCM, pAF on eliquis, SSS with PPM, IDA who is seen for DOE.  Recently admitted and discharged 9/26 for decompensated HOCM, ABLA and consequent workup of colonic mass which led to diagnosis of stage I colon cancer now s/p resection.  She has DOE that limits her with significant exertion - probably only a bit more than peers without issue.   She has a little cough but not bothersome. She has no CP. She has no orthopnea. She does have some swelling in ankles which is stable since hospitalization.  Otherwise pertinent review of systems is negative.  She has no family history of lung disease  She worked in [employer] and then [employer]. She has no pets at home. She has never lived outside of [place]. Never smoker.   Past Medical History:  Diagnosis Date   A-fib [facility])    CAD (coronary artery disease)    Diverticulosis    Esophageal reflux    no meds   External hemorrhoids    Gastritis    Hypertension    IDA (iron deficiency anemia)    Internal hemorrhoids    Other and unspecified hyperlipidemia    SSS (sick sinus syndrome) (HCC)      Family History  Problem Relation Age of Onset   Heart disease Other    Osteoporosis Other    Arthritis Other    Colon cancer Neg Hx    Colon polyps Neg Hx      Past Surgical History:  Procedure Laterality Date   ABDOMINAL HYSTERECTOMY  08/2008   BIOPSY  12/20/2020   Procedure: BIOPSY;  Surgeon: Gatha Mayer, MD;  Location: [facility] ENDOSCOPY;  Service: Endoscopy;;   BLADDER REPAIR     COLONOSCOPY WITH PROPOFOL N/A 12/20/2020   Procedure: COLONOSCOPY WITH PROPOFOL;  Surgeon: Gatha Mayer, MD;  Location: [facility] ENDOSCOPY;  Service: Endoscopy;  Laterality: N/A;    LAPAROSCOPIC PARTIAL COLECTOMY N/A 12/22/2020   Procedure: LAPAROSCOPIC ASSISTED PARTIAL COLECTOMY;  Surgeon: Coralie Keens, MD;  Location: [place];  Service: General;  Laterality: N/A;   PACEMAKER IMPLANT N/A 02/28/2020   Procedure: PACEMAKER IMPLANT;  Surgeon: Sanda Klein, MD;  Location: [facility];  Service: Cardiovascular;  Laterality: N/A;   RIGHT/LEFT HEART CATH AND CORONARY ANGIOGRAPHY N/A 12/15/2020   Procedure: RIGHT/LEFT HEART CATH AND CORONARY ANGIOGRAPHY;  Surgeon: Leonie Man, MD;  Location: [place] [facility];  Service: Cardiovascular;  Laterality: N/A;   TONSILLECTOMY      Social History   Socioeconomic History   Marital status: Married    Spouse name: Not on file   Number of children: Not on file   Years of education: Not on file   Highest education level: Not on file  Occupational History   Not on file  Tobacco Use   Smoking status: Never   Smokeless tobacco: Never  Vaping Use   Vaping Use: Never used  Substance and Sexual Activity   Alcohol use: No    Alcohol/week: 0.0 standard drinks   Drug use: No   Sexual activity: Not on file  Other Topics Concern   Not on file  Social History Narrative   Not on file   Social Determinants of  Health   Financial Resource Strain: Not on file  Food Insecurity: Not on file  Transportation Needs: Not on file  Physical Activity: Not on file  Stress: Not on file  Social Connections: Not on file  Intimate Partner Violence: Not on file     Allergies  Allergen Reactions   Cefzil [Cefprozil] Other (See Comments)    angioedema     Outpatient Medications Prior to Visit  Medication Sig Dispense Refill   acetaminophen (TYLENOL) 500 MG tablet Take 2 tablets (1,000 mg total) by mouth every 6 (six) hours as needed for fever or mild pain. 30 tablet 0   apixaban (ELIQUIS) 5 MG TABS tablet TAKE 1 TABLET(5 MG) BY MOUTH TWICE DAILY (Patient taking differently: Take 5 mg by mouth 2 (two) times daily.) 60 tablet 5    Calcium Carbonate-Vitamin D 600-400 MG-UNIT tablet Take 1 tablet by mouth 2 (two) times daily.      cetirizine (ZYRTEC) 10 MG tablet Take 10 mg by mouth every evening.      ferrous sulfate 325 (65 FE) MG EC tablet Take 325 mg by mouth in the morning and at bedtime.     LORazepam (ATIVAN) 1 MG tablet Take 0.5 mg by mouth See admin instructions. Every other night     metoprolol tartrate (LOPRESSOR) 50 MG tablet Take 1 tablet (50 mg total) by mouth 2 (two) times daily. 60 tablet 3   midodrine (PROAMATINE) 10 MG tablet Take 1 tablet (10 mg total) by mouth 3 (three) times daily with meals. 90 tablet 3   simvastatin (ZOCOR) 20 MG tablet Take 20 mg by mouth daily.     Sodium Sulfate-Mag Sulfate-KCl (SUTAB) [id number] MG TABS Use as directed for colonoscopy. MANUFACTURER CODES!! BIN: [id number] PCN: CN GROUP: [id number] MEMBER ID: [id number];[employer] AS SECONDARY INSURANCE ;NO PRIOR AUTHORIZATION 24 tablet 0   No facility-administered medications prior to visit.       Objective:   Physical Exam:  General appearance: 71 y.o., female, NAD, conversant  Eyes: anicteric sclerae, moist conjunctivae; no lid-lag; PERRL, tracking appropriately HENT: NCAT; oropharynx, MMM, no mucosal ulcerations; normal hard and soft palate Neck: Trachea midline; no lymphadenopathy, no JVD Lungs: CTAB, no crackles, no wheeze, with normal respiratory effort CV: RRR, +systolic murmur over LSB Abdomen: Soft, non-tender; non-distended, BS present  Extremities: No peripheral edema, radial and DP pulses present bilaterally  Skin: Normal temperature, turgor and texture; no rash Psych: Appropriate affect Neuro: Alert and oriented to person and place, no focal deficit    Vitals:   01/17/21 1359  BP: 124/78  Pulse: 65  Temp: 98.2 F (36.8 C)  TempSrc: Oral  SpO2: 98%  Weight: 134 lb (60.8 kg)  Height: 5\' 2"  (1.575 m)   98% on RA BMI Readings from Last 3 Encounters:  01/17/21 24.51 kg/m  01/08/21 24.25 kg/m  12/25/20  23.83 kg/m   Wt Readings from Last 3 Encounters:  01/17/21 134 lb (60.8 kg)  01/08/21 132 lb 9 oz (60.1 kg)  12/25/20 130 lb 4.8 oz (59.1 kg)     CBC    Component Value Date/Time   WBC 4.6 01/08/2021 1437   WBC 9.2 12/24/2020 0217   RBC 3.43 (L) 01/08/2021 1437   RBC 3.49 (L) 01/08/2021 1437   HGB 10.7 (L) 01/08/2021 1437   HGB 11.3 02/16/2020 1056   HCT 34.2 (L) 01/08/2021 1437   HCT 34.2 02/16/2020 1056   PLT 246 01/08/2021 1437   PLT 213 02/16/2020 1056   MCV 98.0 01/08/2021  1437   MCV 92 02/16/2020 1056   MCH 30.7 01/08/2021 1437   MCHC 31.3 01/08/2021 1437   RDW 13.4 01/08/2021 1437   RDW 13.3 02/16/2020 1056   LYMPHSABS 0.9 01/08/2021 1437   MONOABS 0.3 01/08/2021 1437   EOSABS 0.1 01/08/2021 1437   BASOSABS 0.0 01/08/2021 1437      Chest Imaging: CT [place] 12/18/20 with bilateral R>L pleural effusion, interlobular septal thickening, borderline LAD, atelectasis  CXR today reviewed by me with resolution of effusions  Pulmonary Functions Testing Results: No flowsheet data found.  None available    Echocardiogram:   TTE 12/15/20: 1. Left ventricular ejection fraction, by estimation, is 55-60%. The left  ventricle demonstrates regional wall motion abnormalities that are  consistent with a left bundle branch block.There is severe asymmetric left  ventricular hypertrophy. Left  ventricular diastolic parameters are consistent with Grade I diastolic  dysfunction (impaired relaxation). No significant LVOT Obstructive seen in  this study.   2. Right ventricular systolic function is normal. The right ventricular  size is normal. There is normal pulmonary artery systolic pressure.   3. The mitral valve is normal in structure. Mild mitral valve  regurgitation. No evidence of mitral stenosis.   4. The aortic valve was not well visualized. Aortic valve regurgitation  is trivial. No aortic stenosis is present.   5. The inferior vena cava is normal in size with  <50% respiratory  variability, suggesting right atrial pressure of 8 mmHg.   6. Technically difficult acquisition; in future studies consider use of  echo-contrast.   Heart Catheterization:   SUMMARY Angiographically Normal Coronary arteries with a left dominant system. Significant LVOT gradient with apical LV pressures of 184/12 mmHg with an EDP of 25, mid LV cavity 157/9 mmHg with EDP of 25 and outflow tract pressure of 111/9 mmHg with a stable EDP of 24 mmHg. Mild Secondary Pulmonary Hypertension with mean PAP of 29 mmHg and PCWP of 29 mmHg. Moderately reduced cardiac function with a cardiac output-index of 3.26 and 2.02 by Fick and 3.55-2.2 by thermodilution.     RECOMMENDATIONS Gentle titration of medications to allow for diuresis without leading to systemic hypotension. Defer decision making to rounding cardiology service.    Assessment & Plan:   # DOE  # HOCM # Mild Group 2 PH # Bilateral pleural effusions DOE seems relatively mild currently and it would likely be multifactorial - HOCM, possibility of chronotropic insufficiency depending on rate responsiveness of PPM, deconditioning. Apart from recent effusion, there is little by history to suggest underlying pulmonary process and she has virtually no effusion on CXR today.   # Bradycardia # Hypertension Encouraged to discuss with cardiologist, EP, PCP.   Plan: - If still has DOE after making any necessary changes to BP meds, diuretic in setting of her HOCM at next visit then could obtain PFTs   I spent 55 minutes dedicated to the care of this patient on the date of this encounter to include pre-visit review of records, face-to-face time with the patient discussing conditions above, post visit ordering of testing, clinical documentation with the electronic health record, and communicating necessary findings to members of the patients care team.      Maryjane Hurter, MD [facility] 01/17/2021 2:44  PM

## 2021-01-17 NOTE — Patient Instructions (Signed)
-   Follow up with cardiology regarding blood pressure and heart rate medications. Also need to make sure that your heart rate responsiveness in pacemaker is adequate - ask EP about this .  - x ray today - i'll let you know if we need to consider draining pleural fluid - if not feeling better after cardiology follow up

## 2021-01-18 DIAGNOSIS — Z48812 Encounter for surgical aftercare following surgery on the circulatory system: Secondary | ICD-10-CM | POA: Diagnosis not present

## 2021-01-18 DIAGNOSIS — C180 Malignant neoplasm of cecum: Secondary | ICD-10-CM | POA: Diagnosis not present

## 2021-01-18 DIAGNOSIS — I110 Hypertensive heart disease with heart failure: Secondary | ICD-10-CM | POA: Diagnosis not present

## 2021-01-18 DIAGNOSIS — I5033 Acute on chronic diastolic (congestive) heart failure: Secondary | ICD-10-CM | POA: Diagnosis not present

## 2021-01-18 DIAGNOSIS — D630 Anemia in neoplastic disease: Secondary | ICD-10-CM | POA: Diagnosis not present

## 2021-01-18 DIAGNOSIS — I2510 Atherosclerotic heart disease of native coronary artery without angina pectoris: Secondary | ICD-10-CM | POA: Diagnosis not present

## 2021-01-18 DIAGNOSIS — Z483 Aftercare following surgery for neoplasm: Secondary | ICD-10-CM | POA: Diagnosis not present

## 2021-01-18 DIAGNOSIS — I480 Paroxysmal atrial fibrillation: Secondary | ICD-10-CM | POA: Diagnosis not present

## 2021-01-18 DIAGNOSIS — I214 Non-ST elevation (NSTEMI) myocardial infarction: Secondary | ICD-10-CM | POA: Diagnosis not present

## 2021-01-22 ENCOUNTER — Other Ambulatory Visit (HOSPITAL_COMMUNITY): Payer: Self-pay

## 2021-01-25 DIAGNOSIS — D630 Anemia in neoplastic disease: Secondary | ICD-10-CM | POA: Diagnosis not present

## 2021-01-25 DIAGNOSIS — I5033 Acute on chronic diastolic (congestive) heart failure: Secondary | ICD-10-CM | POA: Diagnosis not present

## 2021-01-25 DIAGNOSIS — I110 Hypertensive heart disease with heart failure: Secondary | ICD-10-CM | POA: Diagnosis not present

## 2021-01-25 DIAGNOSIS — I2510 Atherosclerotic heart disease of native coronary artery without angina pectoris: Secondary | ICD-10-CM | POA: Diagnosis not present

## 2021-01-25 DIAGNOSIS — Z1231 Encounter for screening mammogram for malignant neoplasm of breast: Secondary | ICD-10-CM | POA: Diagnosis not present

## 2021-01-25 DIAGNOSIS — Z483 Aftercare following surgery for neoplasm: Secondary | ICD-10-CM | POA: Diagnosis not present

## 2021-01-25 DIAGNOSIS — C180 Malignant neoplasm of cecum: Secondary | ICD-10-CM | POA: Diagnosis not present

## 2021-01-25 DIAGNOSIS — Z48812 Encounter for surgical aftercare following surgery on the circulatory system: Secondary | ICD-10-CM | POA: Diagnosis not present

## 2021-01-25 DIAGNOSIS — I480 Paroxysmal atrial fibrillation: Secondary | ICD-10-CM | POA: Diagnosis not present

## 2021-01-25 DIAGNOSIS — I214 Non-ST elevation (NSTEMI) myocardial infarction: Secondary | ICD-10-CM | POA: Diagnosis not present

## 2021-02-05 ENCOUNTER — Other Ambulatory Visit: Payer: Self-pay

## 2021-02-05 ENCOUNTER — Ambulatory Visit (INDEPENDENT_AMBULATORY_CARE_PROVIDER_SITE_OTHER): Payer: Medicare PPO | Admitting: Cardiovascular Disease

## 2021-02-05 ENCOUNTER — Encounter: Payer: Self-pay | Admitting: Cardiovascular Disease

## 2021-02-05 VITALS — BP 147/75 | HR 48 | Ht 62.0 in | Wt 131.2 lb

## 2021-02-05 DIAGNOSIS — I495 Sick sinus syndrome: Secondary | ICD-10-CM | POA: Diagnosis not present

## 2021-02-05 DIAGNOSIS — D500 Iron deficiency anemia secondary to blood loss (chronic): Secondary | ICD-10-CM | POA: Diagnosis not present

## 2021-02-05 DIAGNOSIS — I10 Essential (primary) hypertension: Secondary | ICD-10-CM | POA: Diagnosis not present

## 2021-02-05 DIAGNOSIS — E7800 Pure hypercholesterolemia, unspecified: Secondary | ICD-10-CM

## 2021-02-05 DIAGNOSIS — Z950 Presence of cardiac pacemaker: Secondary | ICD-10-CM | POA: Diagnosis not present

## 2021-02-05 DIAGNOSIS — I480 Paroxysmal atrial fibrillation: Secondary | ICD-10-CM | POA: Diagnosis not present

## 2021-02-05 DIAGNOSIS — I421 Obstructive hypertrophic cardiomyopathy: Secondary | ICD-10-CM

## 2021-02-05 DIAGNOSIS — I2510 Atherosclerotic heart disease of native coronary artery without angina pectoris: Secondary | ICD-10-CM | POA: Diagnosis not present

## 2021-02-05 DIAGNOSIS — Z7901 Long term (current) use of anticoagulants: Secondary | ICD-10-CM

## 2021-02-05 MED ORDER — LORAZEPAM 1 MG PO TABS: 1.0000 mg | ORAL_TABLET | Freq: Three times a day (TID) | ORAL | 0 refills | Status: DC | PRN

## 2021-02-05 MED ORDER — LORAZEPAM 1 MG PO TABS: 0.5000 mg | ORAL_TABLET | Freq: Every evening | ORAL | 0 refills | Status: DC

## 2021-02-05 MED ORDER — BISOPROLOL FUMARATE 10 MG PO TABS: 10.0000 mg | ORAL_TABLET | Freq: Every day | ORAL | 4 refills | Status: DC

## 2021-02-05 NOTE — Patient Instructions (Addendum)
Medication Instructions:  STOP the Metoprolol STOP the Midodrine  START Bisoprolol 10 mg once daily  TAKE Ativan 0.5 mg nightly as needed. No refills  *If you need a refill on your cardiac medications before your next appointment, please call your pharmacy*   Lab Work: None ordered If you have labs (blood work) drawn today and your tests are completely normal, you will receive your results only by: [place] (if you have MyChart) OR A paper copy in the mail If you have any lab test that is abnormal or we need to change your treatment, we will call you to review the results.   Testing/Procedures: None ordered   Follow-Up: At [facility], you and your health needs are our priority.  As part of our continuing mission to provide you with exceptional heart care, we have created designated Provider Care Teams.  These Care Teams include your primary Cardiologist (physician) and Advanced Practice Providers (APPs -  Physician Assistants and Nurse Practitioners) who all work together to provide you with the care you need, when you need it.  We recommend signing up for the patient portal called "MyChart".  Sign up information is provided on this After Visit Summary.  MyChart is used to connect with patients for Virtual Visits (Telemedicine).  Patients are able to view lab/test results, encounter notes, upcoming appointments, etc.  Non-urgent messages can be sent to your provider as well.   To learn more about what you can do with MyChart, go to [email or web address].    Your next appointment:   3 month(s) on a device day  The format for your next appointment:   In Person  Provider:   Sanda Klein, MD

## 2021-02-05 NOTE — Progress Notes (Signed)
Cardiology Office Note:    Date:  02/11/2021   ID:  Alexandria French, DOB 22-Nov-1950, MRN [id number]  PCP:  [place] Nation, MD  [facility] Cardiologist:  Gita Dilger [facility] Electrophysiologist:  None   Referring MD: [place] Nation, MD   Chief Complaint  Patient presents with   Pacemaker Check   Cardiomyopathy     History of Present Illness:    Alexandria French is a 70 y.o. female with a hx of paroxysmal atrial fibrillation, left bundle branch block, HOCM, sinus bradycardia, elevated coronary calcium score without significant coronary stenosis by coronary CT angiogram (12/2018).  Feels better on beta-blockers, but the dose was limited by bradycardia.  Underwent pacemaker implantation in late November 2021.  In September 2022 she presented with severe symptomatic anemia (hemoglobin around 8) and was found to have a adenocarcinoma of the right colon.  She underwent a partial ileocolectomy on 12/22/2020. She will not require chemotherapy.  Labs showed findings consistent with iron deficiency.  Her most recent hemoglobin on October 10 was 10.7.  Her gastroenterologist is Dr. Arelia Longest.  Her surgeon was Dr. Ninfa Linden.  During the hospitalization she had problems with hypotension.  She underwent cardiac catheterization which showed angiographically normal coronary arteries and confirmed the presence of a significant LV outflow tract gradient.  She was treated with fluids and midodrine.  She subsequently stopped her midodrine since she noticed that her blood pressure was too high.  Prior to hospitalization, he had been treated with bisoprolol which she seemed to tolerate better than metoprolol.  She was discharged from the hospital metoprolol to 50 mg twice daily.  She was worried because his heart rate monitored at home was sometimes less than 60 bpm, ECG performed today shows PVCs in a pattern of trigeminy, explaining the artifactual bradycardia.  This also explains probably why her  blood pressure recordings at home have been quite erratic.  Pacemaker interrogation shows normal device function.  Her [employer] device has 10.5 years of estimated longevity.  She has 90% atrial pacing and relatively frequent ventricular pacing at about 65%.  I tightened up the paced and sensed AV delay today remote ventricular pacing which might help with her LV outflow tract obstruction.  There have been no episodes of atrial fibrillation.  A couple of episodes of nonsustained VT actually represent paroxysmal atrial tachycardia with 1: 1 AV conduction.  She wore an event monitor that showed prominent sinus bradycardia and chronotropic incompetence, with a maximum heart rate but only reached 60% of max predicted.  She did have frequent mostly monomorphic PVCs (6% of all beats).  Also had some brief bursts of nonsustained atrial tachycardia.  There was no complex ventricular arrhythmia. She has not had full syncope previously or documented VT. There is no family history of HCM, VT, sudden death or CHF.  2 separate echocardiograms performed in August 2020 in May 2021 show findings consistent with hypertrophic obstructive cardiomyopathy, although the degree of left ventricular hypertrophy is relatively mild and appears to be concentric rather than asymmetrical.  Systolic anterior motion of the mitral valve with increased gradients across the left ventricular outflow tract.  These have been calculated at 145 mmHg and 213 mmHg respectively, although I am not sure that the estimation is accurate.  There seems to be a lot of contamination with mitral regurgitant flow on the Doppler signal.  There is however convincing evidence of gradients across the LVOT of at least 50 mmHg at rest.  On both studies  the left ventricular systolic function is hyperdynamic with ejection fraction in excess of 75%.  There is no evidence of major abnormalities of the aortic valve itself.  Coronary CT angiogram  performed in September 2020 showed a calcium score in the 85th percentile with no major coronary artery stenoses.  The coronary system is left dominant.  There is very mild bilateral carotid artery plaque with stenoses less than 40%.  Atrial fibrillation was reportedly detected on a previous cardiac monitor in June 2019.  The ventricular rate was over 178 bpm.  The episode was associated with dizziness and lightheadedness.  I am not sure it can accurately be categorized as atrial fibrillation since it was so brief, but she definitely had at least paroxysmal atrial tachycardia.  Although frequent PVCs including ventricular bigeminy were recorded, there was no evidence of ventricular tachycardia.  She has a history of essential hypertension that has been mostly managed with lisinopril over the years.  She presented with profound anemia and hypotension and was found to have a right colon adenocarcinoma for which she underwent surgery December 22, 2020.  Past Medical History:  Diagnosis Date   A-fib [facility])    CAD (coronary artery disease)    Diverticulosis    Esophageal reflux    no meds   External hemorrhoids    Gastritis    Hypertension    IDA (iron deficiency anemia)    Internal hemorrhoids    Other and unspecified hyperlipidemia    SSS (sick sinus syndrome) ([place])     Past Surgical History:  Procedure Laterality Date   ABDOMINAL HYSTERECTOMY  08/2008   BIOPSY  12/20/2020   Procedure: BIOPSY;  Surgeon: Gatha Mayer, MD;  Location: [place];  Service: Endoscopy;;   BLADDER REPAIR     COLONOSCOPY WITH PROPOFOL N/A 12/20/2020   Procedure: COLONOSCOPY WITH PROPOFOL;  Surgeon: Gatha Mayer, MD;  Location: [facility] ENDOSCOPY;  Service: Endoscopy;  Laterality: N/A;   LAPAROSCOPIC PARTIAL COLECTOMY N/A 12/22/2020   Procedure: LAPAROSCOPIC ASSISTED PARTIAL COLECTOMY;  Surgeon: Coralie Keens, MD;  Location: [place];  Service: General;  Laterality: N/A;   PACEMAKER IMPLANT N/A 02/28/2020    Procedure: PACEMAKER IMPLANT;  Surgeon: Sanda Klein, MD;  Location: [place] CV LAB;  Service: Cardiovascular;  Laterality: N/A;   RIGHT/LEFT HEART CATH AND CORONARY ANGIOGRAPHY N/A 12/15/2020   Procedure: RIGHT/LEFT HEART CATH AND CORONARY ANGIOGRAPHY;  Surgeon: Leonie Man, MD;  Location: [place] CV LAB;  Service: Cardiovascular;  Laterality: N/A;   TONSILLECTOMY      Current Medications: Current Meds  Medication Sig   apixaban (ELIQUIS) 5 MG TABS tablet TAKE 1 TABLET(5 MG) BY MOUTH TWICE DAILY (Patient taking differently: Take 5 mg by mouth 2 (two) times daily.)   bisoprolol (ZEBETA) 10 MG tablet Take 1 tablet (10 mg total) by mouth daily.   Calcium Carbonate-Vitamin D 600-400 MG-UNIT tablet Take 1 tablet by mouth 2 (two) times daily.    cetirizine (ZYRTEC) 10 MG tablet Take 10 mg by mouth every evening.    ferrous sulfate 325 (65 FE) MG EC tablet Take 325 mg by mouth in the morning and at bedtime.   simvastatin (ZOCOR) 20 MG tablet Take 20 mg by mouth daily.   Sodium Sulfate-Mag Sulfate-KCl (SUTAB) [phone] MG TABS Use as directed for colonoscopy. MANUFACTURER CODES!! BIN: [id number] PCN: CN GROUP: [id number] MEMBER ID: [id number];[employer] AS SECONDARY INSURANCE ;NO PRIOR AUTHORIZATION   vitamin C (ASCORBIC ACID) 500 MG tablet Take 500 mg by mouth daily.   [  DISCONTINUED] LORazepam (ATIVAN) 1 MG tablet Take 0.5 mg by mouth See admin instructions. Every other night     Allergies:   Cefzil [cefprozil]   Social History   Socioeconomic History   Marital status: Married    Spouse name: Not on file   Number of children: Not on file   Years of education: Not on file   Highest education level: Not on file  Occupational History   Not on file  Tobacco Use   Smoking status: Never   Smokeless tobacco: Never  Vaping Use   Vaping Use: Never used  Substance and Sexual Activity   Alcohol use: No    Alcohol/week: 0.0 standard drinks   Drug use: No   Sexual activity: Not on file   Other Topics Concern   Not on file  Social History Narrative   Not on file   Social Determinants of Health   Financial Resource Strain: Not on file  Food Insecurity: Not on file  Transportation Needs: Not on file  Physical Activity: Not on file  Stress: Not on file  Social Connections: Not on file     Family History: The patient's family history includes Arthritis in an other family member; Heart disease in an other family member; Osteoporosis in an other family member. There is no history of Colon cancer or Colon polyps.  Father lived to 22 years old, has an older sister 74 years old who is healthy, had another sister died at age 83 of a lung infection.  1 child (son), healthy.  ROS:   Please see the history of present illness. All other systems are reviewed and are negative.   EKGs/Labs/Other Studies Reviewed:    The following studies were reviewed today: Cardiac Cath 12/15/2020    LV end diastolic pressure is severely elevated.   Hemodynamic findings consistent with mild pulmonary hypertension.   SUMMARY Angiographically Normal Coronary arteries with a left dominant system. Significant LVOT gradient with apical LV pressures of 184/12 mmHg with an EDP of 25, mid LV cavity 157/9 mmHg with EDP of 25 and outflow tract pressure of 111/9 mmHg with a stable EDP of 24 mmHg. Mild Secondary Pulmonary Hypertension with mean PAP of 29 mmHg and PCWP of 29 mmHg. Moderately reduced cardiac function with a cardiac output-index of 3.26 and 2.02 by Fick and 3.55-2.2 by thermodilution.     RECOMMENDATIONS Gentle titration of medications to allow for diuresis without leading to systemic hypotension. Defer decision making to rounding cardiology service   Echocardiogram 12/15/2020   1. Left ventricular ejection fraction, by estimation, is 55-60%. The left  ventricle demonstrates regional wall motion abnormalities that are  consistent with a left bundle branch block.There is severe  asymmetric left  ventricular hypertrophy. Left  ventricular diastolic parameters are consistent with Grade I diastolic  dysfunction (impaired relaxation). No significant LVOT Obstructive seen in  this study.   2. Right ventricular systolic function is normal. The right ventricular  size is normal. There is normal pulmonary artery systolic pressure.   3. The mitral valve is normal in structure. Mild mitral valve  regurgitation. No evidence of mitral stenosis.   4. The aortic valve was not well visualized. Aortic valve regurgitation  is trivial. No aortic stenosis is present.   5. The inferior vena cava is normal in size with <50% respiratory  variability, suggesting right atrial pressure of 8 mmHg.   6. Technically difficult acquisition; in future studies consider use of  echo-contrast.   Comparison(s): A prior  study was performed on 08/27/19. Prior images  reviewed side by side. Compared to prior septal wall motion is new. LVOT  obstructive more prominent on prior study.   Conclusion(s)/Recommendation(s): Consider cardiac MRI for assessment of  hypertrophic cardioymyopathy.  Echocardiogram 08/27/2019   1. LVOT peak gradient 213 mmHg. Left ventricular ejection fraction, by estimation, is 70 to 75%. The left ventricle has hyperdynamic function. The left ventricle has no regional wall motion abnormalities. There is mild concentric left ventricular hypertrophy. Left ventricular diastolic parameters are consistent with Grade I diastolic dysfunction (impaired relaxation). Elevated left atrial pressure. 2. Right ventricular systolic function is normal. The right ventricular size is normal. There is normal pulmonary artery systolic pressure. 3. The mitral valve is grossly normal. Trivial mitral valve regurgitation. 4. The aortic valve is tricuspid. Aortic valve regurgitation is mild. No aortic stenosis is present. 5. The inferior vena cava is normal in size with greater than 50% respiratory  variability, suggesting right atrial pressure of 3 mmHg.     Echocardiogram 11/26/2018:   1. The left ventricle has a visually estimated ejection fraction of 75%.  The cavity size was normal. Left ventricular diastolic Doppler parameters  are consistent with impaired relaxation. Elevated left ventricular  end-diastolic pressure No evidence of  left ventricular regional wall motion abnormalities.   2. The right ventricle has normal systolic function. The cavity was  normal. There is no increase in right ventricular wall thickness.   3. The aortic valve is tricuspid. Mild thickening of the aortic valve.  Aortic valve regurgitation is mild by color flow Doppler. Mild aortic  annular calcification noted.   4. The mitral valve is abnormal. Moderate thickening of the mitral valve  leaflet. Mitral valve regurgitation is moderate by color flow Doppler.   5. Mitral chordal SAM is noted. LVOT is small. Peak gradient 145 mmHg.   6. The tricuspid valve is grossly normal. Tricuspid valve regurgitation  is moderate.   7. The aorta is normal unless otherwise noted.      Coronary CT angiography 12/03/2018:  FINDINGS: Coronary calcium score: The patient's coronary artery calcium score is 149, which places the patient in the 85th percentile. Coronary arteries: Normal coronary origins.  Left dominance Right Coronary Artery: Small, non-dominant vessel which gives off a larger AV nodal branch. No obstructive disease. Left Main Coronary Artery: Prominent left coronary cusp, but not anuerymsal. No stenosis. Gives off larger LAD and LCx arteries. Left Anterior Descending Coronary Artery: Calcified proximal vessel with mild 25-49% (CADRADS2) mixed stenosis. Gives off several small diagonal branches. Ramus Intermedius Artery: Small branch without significant stenosis.   Left Circumflex Artery: Dominant vessel that gives off a small PDA artery. Larger caliber with minimal mixed 1-24% mid-vessel  stenosis (CADRADS1).  Aorta: Normal size, 28 mm at the mid ascending aorta (level of the PA bifurcation) measured double oblique. Atherosclerosis. No dissection. Aortic Valve: Trileaflet.  Calcification of the annulus.   Other findings: Thickening of the aortic and mitral valve leaflets. Normal biatrial size Normal pulmonary vein drainage into the left atrium. Normal left atrial appendage without a thrombus. Normal size of the pulmonary artery.   IMPRESSION: 1. Mild, non-obstructive CAD primarily of the LAD, CADRADS = 2. 2. Coronary calcium score of 149. This was 85th percentile for age and sex matched control. 3. Normal coronary origin with left dominance. 4. Thickening of the aortic and mitral valve leaflets  MCOT monitor 07/14/2017 Sinus rhythm with frequent PVCs. There was one episode of rapid atrial fibrillation.  EKG:  EKG is not ordered today.  The tracing from 12/15/2020 is reviewed and shows AV sequential pacing.  Recent Labs: 12/15/2020: B Natriuretic Peptide 1,765.0 12/22/2020: Magnesium 2.0 01/08/2021: ALT 18; BUN 9; Creatinine 0.89; Hemoglobin 10.7; Platelet Count 246; Potassium 3.8; Sodium 143   Recent Lipid Panel No results found for: CHOL, TRIG, HDL, CHOLHDL, VLDL, LDLCALC, LDLDIRECT 10/07/2019 Total cholesterol 171, HDL 56, LDL 86, triglycerides 145  10/10/2020 Cholesterol 164, HDL 48, triglycerides 178  Physical Exam:    VS:  BP (!) 147/75 (BP Location: Left Arm, Patient Position: Sitting, Cuff Size: Normal)   Pulse (!) 48   Ht 5\' 2"  (1.575 m)   Wt 131 lb 3.2 oz (59.5 kg)   SpO2 98%   BMI 24.00 kg/m     Wt Readings from Last 3 Encounters:  02/05/21 131 lb 3.2 oz (59.5 kg)  01/17/21 134 lb (60.8 kg)  01/08/21 132 lb 9 oz (60.1 kg)     General: Alert, oriented x3, no distress, well-healed pacemaker site Head: no evidence of trauma, PERRL, EOMI, no exophtalmos or lid lag, no myxedema, no xanthelasma; normal ears, nose and oropharynx Neck: normal  jugular venous pulsations and no hepatojugular reflux; brisk carotid pulses without delay and no carotid bruits Chest: clear to auscultation, no signs of consolidation by percussion or palpation, normal fremitus, symmetrical and full respiratory excursions Cardiovascular: normal position and quality of the apical impulse, regular rhythm, normal first and paradoxically split second heart sounds, no diastolic murmurs, rubs or gallops.  Systolic murmur is heard with every third beat (trigeminy, murmur on post PVC beat), consistent with [place]rough Braun[place] effect Abdomen: no tenderness or distention, no masses by palpation, no abnormal pulsatility or arterial bruits, normal bowel sounds, no hepatosplenomegaly Extremities: no clubbing, cyanosis or edema; 2+ radial, ulnar and brachial pulses bilaterally; 2+ right femoral, posterior tibial and dorsalis pedis pulses; 2+ left femoral, posterior tibial and dorsalis pedis pulses; no subclavian or femoral bruits Neurological: grossly nonfocal Psych: Normal mood and affect    ASSESSMENT:    1. HOCM (hypertrophic obstructive cardiomyopathy) ([place])   2. SSS (sick sinus syndrome) ([place])   3. Pacemaker   4. Essential hypertension   5. Paroxysmal atrial fibrillation (HCC)   6. Long term (current) use of anticoagulants   7. Iron deficiency anemia due to chronic blood loss   8. Nonobstructive atherosclerosis of coronary artery   9. Hypercholesterolemia     PLAN:    In order of problems listed above:  HOCM: Important to keep her on beta-blockers, but we will switch her back to bisoprolol which she seemed to do better with before her surgery.  AV delays adjusted to promote ventricular pacing. SSS: Has about 90% atrial pacing and and a fair heart rate histogram distribution, but we will have to reevaluate this as she recovers from her colon surgery. Pacemaker: Normal device function.  Reevaluate sensor settings in about 3-6 months. HTN:   Blood pressure is  high.  Use beta-blockers preferentially. Avoid potent vasodilators and avoid diuretics.   AFib: Paradoxically, the initiation of anticoagulation for atrial fibrillation was probably a useful complication.  Probably led to an early diagnosis of a right-sided colon cancer.  The burden of atrial fibrillation remains very low. Anticoagulation: Back on apixaban anticoagulation.   Anemia: Improving.  No new bleeding problems.  Remains on iron supplements. Elevated coronary calcium score: No angiographically significant coronary disease at catheterization, but does have significant plaque burden on CT.  On statin.  We will  treat for target LDL less than 70. HLP: On simvastatin. Reassess after full recovery from recent surgery.   Medication Adjustments/Labs and Tests Ordered: Current medicines are reviewed at length with the patient today.  Concerns regarding medicines are outlined above.  No orders of the defined types were placed in this encounter.  Meds ordered this encounter  Medications   DISCONTD: LORazepam (ATIVAN) 1 MG tablet    Sig: Take 1 tablet (1 mg total) by mouth every 8 (eight) hours as needed for anxiety.    Dispense:  20 tablet    Refill:  0   bisoprolol (ZEBETA) 10 MG tablet    Sig: Take 1 tablet (10 mg total) by mouth daily.    Dispense:  30 tablet    Refill:  4   LORazepam (ATIVAN) 1 MG tablet    Sig: Take 0.5 tablets (0.5 mg total) by mouth at bedtime.    Dispense:  20 tablet    Refill:  0     Patient Instructions  Medication Instructions:  STOP the Metoprolol STOP the Midodrine  START Bisoprolol 10 mg once daily  TAKE Ativan 0.5 mg nightly as needed. No refills  *If you need a refill on your cardiac medications before your next appointment, please call your pharmacy*   Lab Work: None ordered If you have labs (blood work) drawn today and your tests are completely normal, you will receive your results only by: [place] (if you have MyChart) OR A paper  copy in the mail If you have any lab test that is abnormal or we need to change your treatment, we will call you to review the results.   Testing/Procedures: None ordered   Follow-Up: At [facility], you and your health needs are our priority.  As part of our continuing mission to provide you with exceptional heart care, we have created designated Provider Care Teams.  These Care Teams include your primary Cardiologist (physician) and Advanced Practice Providers (APPs -  Physician Assistants and Nurse Practitioners) who all work together to provide you with the care you need, when you need it.  We recommend signing up for the patient portal called "MyChart".  Sign up information is provided on this After Visit Summary.  MyChart is used to connect with patients for Virtual Visits (Telemedicine).  Patients are able to view lab/test results, encounter notes, upcoming appointments, etc.  Non-urgent messages can be sent to your provider as well.   To learn more about what you can do with MyChart, go to [email or web address].    Your next appointment:   3 month(s) on a device day  The format for your next appointment:   In Person  Provider:   Sanda Klein, MD      Signed, Sanda Klein, MD  02/11/2021 4:02 PM    [place]

## 2021-02-11 ENCOUNTER — Encounter: Payer: Self-pay | Admitting: Cardiovascular Disease

## 2021-02-26 ENCOUNTER — Ambulatory Visit (INDEPENDENT_AMBULATORY_CARE_PROVIDER_SITE_OTHER): Payer: Medicare PPO

## 2021-02-26 DIAGNOSIS — I495 Sick sinus syndrome: Secondary | ICD-10-CM

## 2021-02-27 LAB — CUP PACEART REMOTE DEVICE CHECK
Battery Remaining Longevity: 120 mo
Battery Remaining Percentage: 100 %
Brady Statistic RA Percent Paced: 69 %
Brady Statistic RV Percent Paced: 76 %
Date Time Interrogation Session: 20221128021200
Implantable Lead Implant Date: 20211129
Implantable Lead Implant Date: 20211129
Implantable Lead Location: 753859
Implantable Lead Location: 753860
Implantable Lead Model: 7840
Implantable Lead Model: 7841
Implantable Lead Serial Number: 1036898
Implantable Lead Serial Number: 1100104
Implantable Pulse Generator Implant Date: 20211129
Lead Channel Impedance Value: 660 Ohm
Lead Channel Impedance Value: 884 Ohm
Lead Channel Pacing Threshold Amplitude: 0.4 V
Lead Channel Pacing Threshold Amplitude: 0.7 V
Lead Channel Pacing Threshold Pulse Width: 0.4 ms
Lead Channel Pacing Threshold Pulse Width: 0.4 ms
Lead Channel Setting Pacing Amplitude: 3.5 V
Lead Channel Setting Pacing Amplitude: 3.5 V
Lead Channel Setting Pacing Pulse Width: 0.4 ms
Lead Channel Setting Sensing Sensitivity: 2.5 mV
Pulse Gen Serial Number: 951099

## 2021-03-06 NOTE — Progress Notes (Signed)
Remote pacemaker transmission.   

## 2021-03-19 DIAGNOSIS — Z0001 Encounter for general adult medical examination with abnormal findings: Secondary | ICD-10-CM | POA: Diagnosis not present

## 2021-03-19 DIAGNOSIS — R5383 Other fatigue: Secondary | ICD-10-CM | POA: Diagnosis not present

## 2021-03-19 DIAGNOSIS — I10 Essential (primary) hypertension: Secondary | ICD-10-CM | POA: Diagnosis not present

## 2021-03-19 DIAGNOSIS — I421 Obstructive hypertrophic cardiomyopathy: Secondary | ICD-10-CM | POA: Diagnosis not present

## 2021-03-19 DIAGNOSIS — N393 Stress incontinence (female) (male): Secondary | ICD-10-CM | POA: Diagnosis not present

## 2021-03-19 DIAGNOSIS — G2581 Restless legs syndrome: Secondary | ICD-10-CM | POA: Diagnosis not present

## 2021-03-19 DIAGNOSIS — D509 Iron deficiency anemia, unspecified: Secondary | ICD-10-CM | POA: Diagnosis not present

## 2021-03-19 DIAGNOSIS — R55 Syncope and collapse: Secondary | ICD-10-CM | POA: Diagnosis not present

## 2021-03-19 DIAGNOSIS — G4700 Insomnia, unspecified: Secondary | ICD-10-CM | POA: Diagnosis not present

## 2021-03-19 DIAGNOSIS — K625 Hemorrhage of anus and rectum: Secondary | ICD-10-CM | POA: Diagnosis not present

## 2021-03-19 DIAGNOSIS — I447 Left bundle-branch block, unspecified: Secondary | ICD-10-CM | POA: Diagnosis not present

## 2021-03-20 ENCOUNTER — Other Ambulatory Visit: Payer: Self-pay | Admitting: Cardiovascular Disease

## 2021-04-23 IMAGING — DX DG CHEST 1V PORT
1 series · 1 of 1 positions shown · non-contrast
Comparison: None.

CLINICAL DATA: Pacemaker.

EXAM:
PORTABLE CHEST 1 VIEW

[chest]
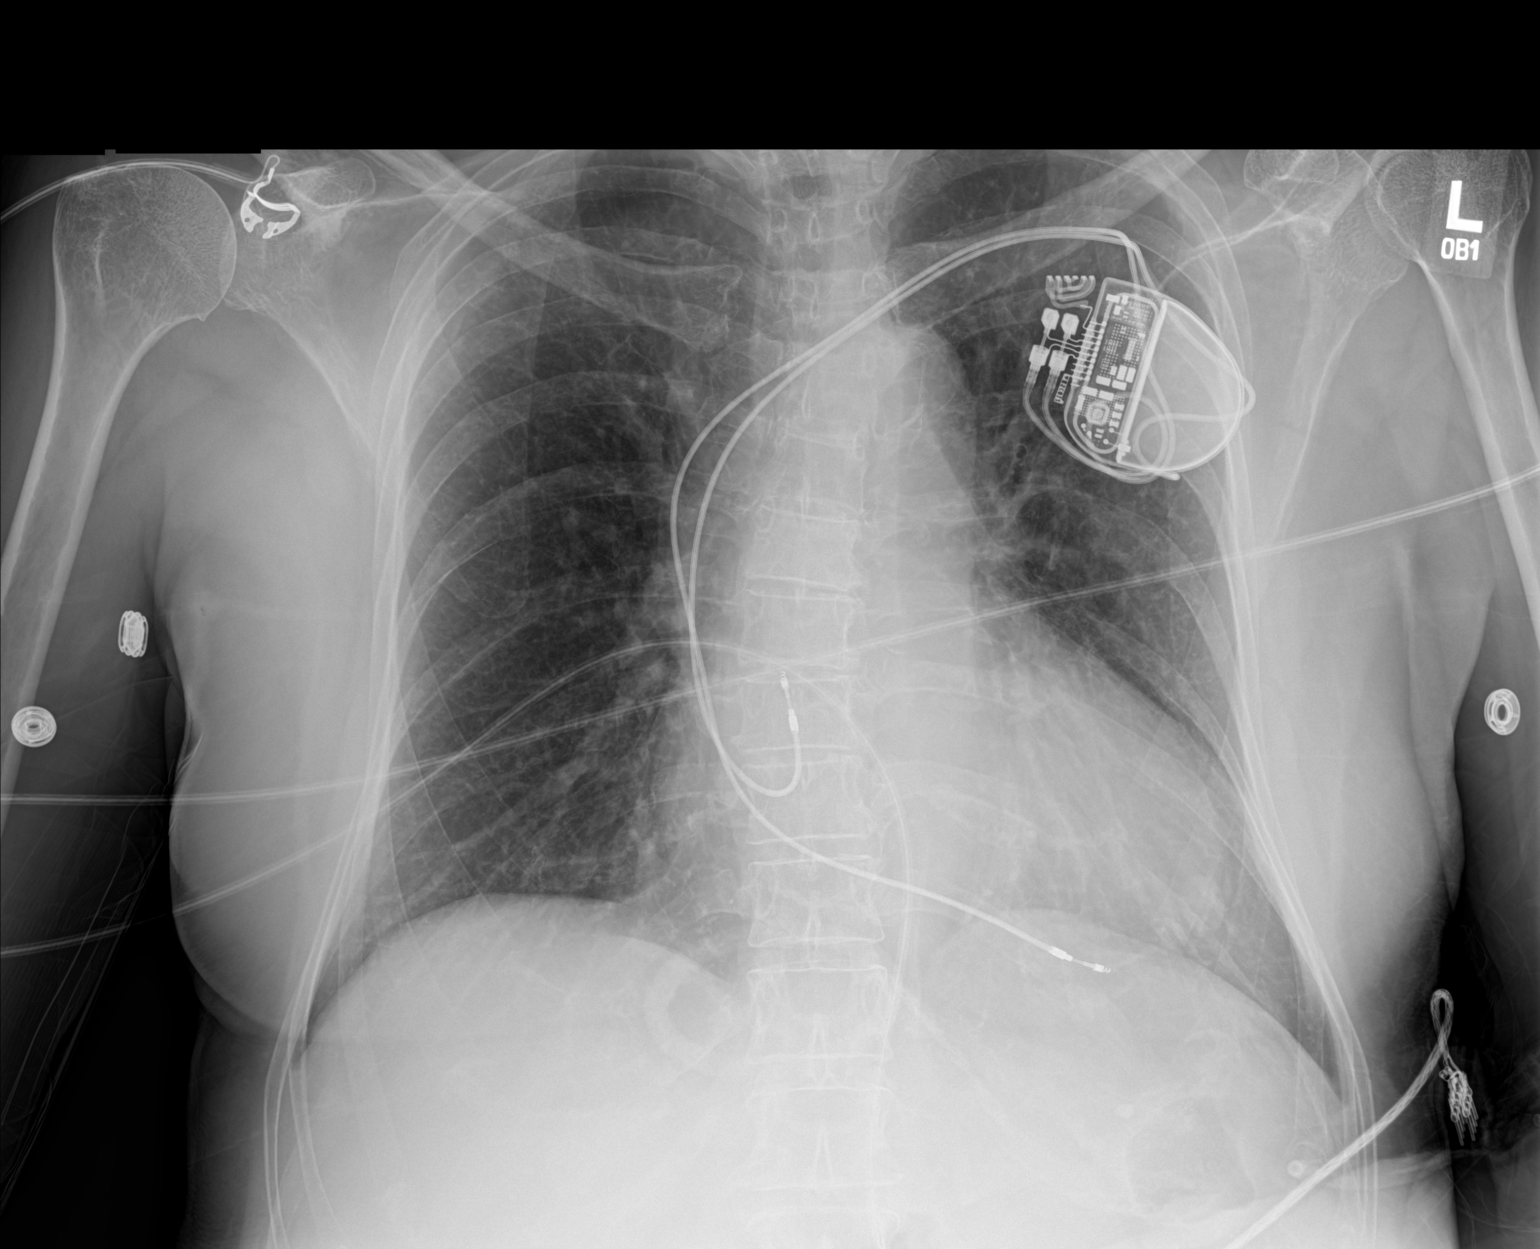

[1 of 1 positions shown; findings below may reference images not displayed]

FINDINGS: Single view of the chest demonstrates a left-sided dual lead cardiac
pacemaker. Leads in the expected region of the right atrial
appendage and right ventricle. Heart size is slightly enlarged. Mild
curvature in the thoracic spine. The lungs are clear without
pulmonary edema. Probable mild scarring at the lung apices. Negative
for pneumothorax. Nodular density overlying the left lower chest.
IMPRESSION: 1. Placement of cardiac pacemaker.  Negative for pneumothorax.
2. Nodular structure in the left lower chest. Findings could
represent a nipple shadow but indeterminate. Recommend follow-up
imaging with nipple markers.

## 2021-05-07 ENCOUNTER — Encounter: Payer: Self-pay | Admitting: Cardiovascular Disease

## 2021-05-07 ENCOUNTER — Other Ambulatory Visit: Payer: Self-pay

## 2021-05-07 ENCOUNTER — Ambulatory Visit (INDEPENDENT_AMBULATORY_CARE_PROVIDER_SITE_OTHER): Payer: Medicare PPO | Admitting: Cardiovascular Disease

## 2021-05-07 VITALS — BP 136/75 | HR 78 | Ht 63.0 in | Wt 137.8 lb

## 2021-05-07 DIAGNOSIS — I421 Obstructive hypertrophic cardiomyopathy: Secondary | ICD-10-CM

## 2021-05-07 DIAGNOSIS — E7800 Pure hypercholesterolemia, unspecified: Secondary | ICD-10-CM

## 2021-05-07 DIAGNOSIS — D500 Iron deficiency anemia secondary to blood loss (chronic): Secondary | ICD-10-CM | POA: Diagnosis not present

## 2021-05-07 DIAGNOSIS — I495 Sick sinus syndrome: Secondary | ICD-10-CM

## 2021-05-07 DIAGNOSIS — I2510 Atherosclerotic heart disease of native coronary artery without angina pectoris: Secondary | ICD-10-CM | POA: Diagnosis not present

## 2021-05-07 DIAGNOSIS — I480 Paroxysmal atrial fibrillation: Secondary | ICD-10-CM | POA: Diagnosis not present

## 2021-05-07 DIAGNOSIS — I10 Essential (primary) hypertension: Secondary | ICD-10-CM

## 2021-05-07 DIAGNOSIS — Z950 Presence of cardiac pacemaker: Secondary | ICD-10-CM | POA: Diagnosis not present

## 2021-05-07 DIAGNOSIS — D6869 Other thrombophilia: Secondary | ICD-10-CM

## 2021-05-07 LAB — PACEMAKER DEVICE OBSERVATION

## 2021-05-07 MED ORDER — BISOPROLOL FUMARATE 10 MG PO TABS: ORAL_TABLET | ORAL | 5 refills | Status: DC

## 2021-05-07 NOTE — Patient Instructions (Signed)
Medication Instructions:  BISOPROLOL- Take 10 mg (one tablet) in the morning and take 5 mg (half a tablet) in the evening.  *If you need a refill on your cardiac medications before your next appointment, please call your pharmacy*   Lab Work: None ordered If you have labs (blood work) drawn today and your tests are completely normal, you will receive your results only by: [place] (if you have MyChart) OR A paper copy in the mail If you have any lab test that is abnormal or we need to change your treatment, we will call you to review the results.   Testing/Procedures: NOne ordered   Follow-Up: At [facility], you and your health needs are our priority.  As part of our continuing mission to provide you with exceptional heart care, we have created designated Provider Care Teams.  These Care Teams include your primary Cardiologist (physician) and Advanced Practice Providers (APPs -  Physician Assistants and Nurse Practitioners) who all work together to provide you with the care you need, when you need it.  We recommend signing up for the patient portal called "MyChart".  Sign up information is provided on this After Visit Summary.  MyChart is used to connect with patients for Virtual Visits (Telemedicine).  Patients are able to view lab/test results, encounter notes, upcoming appointments, etc.  Non-urgent messages can be sent to your provider as well.   To learn more about what you can do with MyChart, go to [email or web address].    Your next appointment:   6 month(s)  The format for your next appointment:   In Person  Provider:   Sanda Klein, MD {

## 2021-05-07 NOTE — Progress Notes (Signed)
Cardiology Office Note:    Date:  05/08/2021   ID:  Shayda Kalka, DOB Mar 20, 1951, MRN [id number]  PCP:  Apison Nation, MD  [facility] Cardiologist:  Khrista Braun [facility] Electrophysiologist:  None   Referring MD: [place] Nation, MD   Chief Complaint  Patient presents with   Atrial Fibrillation     History of Present Illness:    Chasady Longwell is a 71 y.o. female with a hx of paroxysmal atrial fibrillation, left bundle branch block, HOCM, sinus bradycardia, elevated coronary calcium score without significant coronary stenosis by coronary CT angiogram (12/2018).  Feels better on beta-blockers, but the dose was limited by bradycardia.  Underwent pacemaker implantation in late November 2021.  After an eventful few months when she developed severe symptomatic anemia while on anticoagulation for atrial fibrillation and a diagnosis of adenocarcinoma of the right colon followed by surgery she has achieved a new level of stability.  She is improved clinically and has fewer complaints of dyspnea and fatigue.  She denies syncope, dizziness, palpitations.  Her blood pressure is routinely well controlled in the morning but often has systolic blood pressures of 170-180 in the evenings.  She has not had leg edema, orthopnea, PND, falls or new bleeding problems.  Her most recent hemoglobin was up to 13.5.  In September 2022 she presented with severe symptomatic anemia (hemoglobin around 8) and was found to have a adenocarcinoma of the right colon.  She underwent a partial ileocolectomy on 12/22/2020. She will not require chemotherapy.  Labs showed findings consistent with iron deficiency.  Her most recent hemoglobin on October 10 was 10.7.  Her gastroenterologist is Dr. Arelia Longest.  Her surgeon was Dr. Ninfa Linden.  During the hospitalization she had problems with hypotension.  She underwent cardiac catheterization which showed angiographically normal coronary arteries and confirmed the presence  of a significant LV outflow tract gradient.  She was treated with fluids and midodrine.  She subsequently stopped her midodrine since she noticed that her blood pressure was too high.  Prior to hospitalization, he had been treated with bisoprolol which she seemed to tolerate better than metoprolol.  She was discharged from the hospital metoprolol to 50 mg twice daily.  She has had periods of frequent PVCs, often a pattern of trigeminy, that have caused artifactual bradycardia and erratic blood pressure readings.  This has not been the case recently.  Pacemaker interrogation shows normal device function.  Her [employer] Acc[employer]ade device has 10 years of estimated longevity.  She has 75% atrial pacing and 80%.  The AV delays are purposely programmed short to increase the frequency of ventricular pacing and prevent LV outflow tract obstruction.  There has been no atrial fibrillation recorded since her last appointment.  There have been no episodes of nonsustained VT or atrial tachycardia.    She wore an event monitor that showed prominent sinus bradycardia and chronotropic incompetence, with a maximum heart rate but only reached 60% of max predicted.  She did have frequent mostly monomorphic PVCs (6% of all beats).  Also had some brief bursts of nonsustained atrial tachycardia.  There was no complex ventricular arrhythmia. She has not had full syncope previously or documented VT. There is no family history of HCM, VT, sudden death or CHF.  2 separate echocardiograms performed in August 2020 in May 2021 show findings consistent with hypertrophic obstructive cardiomyopathy, although the degree of left ventricular hypertrophy is relatively mild and appears to be concentric rather than asymmetrical.  Systolic anterior motion  of the mitral valve with increased gradients across the left ventricular outflow tract.  These have been calculated at 145 mmHg and 213 mmHg respectively, although I am not sure that the  estimation is accurate.  There seems to be a lot of contamination with mitral regurgitant flow on the Doppler signal.  There is however convincing evidence of gradients across the LVOT of at least 50 mmHg at rest.  On both studies the left ventricular systolic function is hyperdynamic with ejection fraction in excess of 75%.  There is no evidence of major abnormalities of the aortic valve itself.  Coronary CT angiogram performed in September 2020 showed a calcium score in the 85th percentile with no major coronary artery stenoses.  The coronary system is left dominant.  There is very mild bilateral carotid artery plaque with stenoses less than 40%.  Atrial fibrillation was reportedly detected on a previous cardiac monitor in June 2019.  The ventricular rate was over 178 bpm.  The episode was associated with dizziness and lightheadedness.  I am not sure it can accurately be categorized as atrial fibrillation since it was so brief, but she definitely had at least paroxysmal atrial tachycardia.  Although frequent PVCs including ventricular bigeminy were recorded, there was no evidence of ventricular tachycardia.  She has a history of essential hypertension that has been mostly managed with lisinopril over the years.  She presented with profound anemia and hypotension and was found to have a right colon adenocarcinoma for which she underwent surgery December 22, 2020.  Past Medical History:  Diagnosis Date   A-fib [facility])    CAD (coronary artery disease)    Diverticulosis    Esophageal reflux    no meds   External hemorrhoids    Gastritis    Hypertension    IDA (iron deficiency anemia)    Internal hemorrhoids    Other and unspecified hyperlipidemia    SSS (sick sinus syndrome) ([place])     Past Surgical History:  Procedure Laterality Date   ABDOMINAL HYSTERECTOMY  08/2008   BIOPSY  12/20/2020   Procedure: BIOPSY;  Surgeon: Gatha Mayer, MD;  Location: [place];  Service: Endoscopy;;    BLADDER REPAIR     COLONOSCOPY WITH PROPOFOL N/A 12/20/2020   Procedure: COLONOSCOPY WITH PROPOFOL;  Surgeon: Gatha Mayer, MD;  Location[facility] ENDOSCOPY;  Service: Endoscopy;  Laterality: N/A;   LAPAROSCOPIC PARTIAL COLECTOMY N/A 12/22/2020   Procedure: LAPAROSCOPIC ASSISTED PARTIAL COLECTOMY;  Surgeon: Coralie Keens, MD;  Location: [place];  Service: General;  Laterality: N/A;   PACEMAKER IMPLANT N/A 02/28/2020   Procedure: PACEMAKER IMPLANT;  Surgeon: Sanda Klein, MD;  Location: [place] CV LAB;  Service: Cardiovascular;  Laterality: N/A;   RIGHT/LEFT HEART CATH AND CORONARY ANGIOGRAPHY N/A 12/15/2020   Procedure: RIGHT/LEFT HEART CATH AND CORONARY ANGIOGRAPHY;  Surgeon: Leonie Man, MD;  Location: [place] CV LAB;  Service: Cardiovascular;  Laterality: N/A;   TONSILLECTOMY      Current Medications: Current Meds  Medication Sig   apixaban (ELIQUIS) 5 MG TABS tablet TAKE 1 TABLET(5 MG) BY MOUTH TWICE DAILY (Patient taking differently: Take 5 mg by mouth 2 (two) times daily.)   Calcium Carbonate-Vitamin D 600-400 MG-UNIT tablet Take 1 tablet by mouth 2 (two) times daily.    cetirizine (ZYRTEC) 10 MG tablet Take 10 mg by mouth every evening.    ferrous sulfate 325 (65 FE) MG EC tablet Take 325 mg by mouth in the morning and at bedtime.  simvastatin (ZOCOR) 20 MG tablet Take 20 mg by mouth daily.   vitamin C (ASCORBIC ACID) 500 MG tablet Take 500 mg by mouth daily.   [DISCONTINUED] bisoprolol (ZEBETA) 10 MG tablet Take 1 tablet (10 mg total) by mouth daily.     Allergies:   Cefzil [cefprozil]   Social History   Socioeconomic History   Marital status: Married    Spouse name: Not on file   Number of children: Not on file   Years of education: Not on file   Highest education level: Not on file  Occupational History   Not on file  Tobacco Use   Smoking status: Never   Smokeless tobacco: Never  Vaping Use   Vaping Use: Never used  Substance and Sexual Activity    Alcohol use: No    Alcohol/week: 0.0 standard drinks   Drug use: No   Sexual activity: Not on file  Other Topics Concern   Not on file  Social History Narrative   Not on file   Social Determinants of Health   Financial Resource Strain: Not on file  Food Insecurity: Not on file  Transportation Needs: Not on file  Physical Activity: Not on file  Stress: Not on file  Social Connections: Not on file     Family History: The patient's family history includes Arthritis in an other family member; Heart disease in an other family member; Osteoporosis in an other family member. There is no history of Colon cancer or Colon polyps.  Father lived to 16 years old, has an older sister 68 years old who is healthy, had another sister died at age 2 of a lung infection.  1 child (son), healthy.  ROS:   Please see the history of present illness. All other systems are reviewed and are negative.   EKGs/Labs/Other Studies Reviewed:    The following studies were reviewed today: Cardiac Cath 12/15/2020    LV end diastolic pressure is severely elevated.   Hemodynamic findings consistent with mild pulmonary hypertension.   SUMMARY Angiographically Normal Coronary arteries with a left dominant system. Significant LVOT gradient with apical LV pressures of 184/12 mmHg with an EDP of 25, mid LV cavity 157/9 mmHg with EDP of 25 and outflow tract pressure of 111/9 mmHg with a stable EDP of 24 mmHg. Mild Secondary Pulmonary Hypertension with mean PAP of 29 mmHg and PCWP of 29 mmHg. Moderately reduced cardiac function with a cardiac output-index of 3.26 and 2.02 by Fick and 3.55-2.2 by thermodilution.     RECOMMENDATIONS Gentle titration of medications to allow for diuresis without leading to systemic hypotension. Defer decision making to rounding cardiology service   Echocardiogram 12/15/2020   1. Left ventricular ejection fraction, by estimation, is 55-60%. The left  ventricle demonstrates regional  wall motion abnormalities that are  consistent with a left bundle branch block.There is severe asymmetric left  ventricular hypertrophy. Left  ventricular diastolic parameters are consistent with Grade I diastolic  dysfunction (impaired relaxation). No significant LVOT Obstructive seen in  this study.   2. Right ventricular systolic function is normal. The right ventricular  size is normal. There is normal pulmonary artery systolic pressure.   3. The mitral valve is normal in structure. Mild mitral valve  regurgitation. No evidence of mitral stenosis.   4. The aortic valve was not well visualized. Aortic valve regurgitation  is trivial. No aortic stenosis is present.   5. The inferior vena cava is normal in size with <50% respiratory  variability,  suggesting right atrial pressure of 8 mmHg.   6. Technically difficult acquisition; in future studies consider use of  echo-contrast.   Comparison(s): A prior study was performed on 08/27/19. Prior images  reviewed side by side. Compared to prior septal wall motion is new. LVOT  obstructive more prominent on prior study.   Conclusion(s)/Recommendation(s): Consider cardiac MRI for assessment of  hypertrophic cardioymyopathy.  Echocardiogram 08/27/2019   1. LVOT peak gradient 213 mmHg. Left ventricular ejection fraction, by estimation, is 70 to 75%. The left ventricle has hyperdynamic function. The left ventricle has no regional wall motion abnormalities. There is mild concentric left ventricular hypertrophy. Left ventricular diastolic parameters are consistent with Grade I diastolic dysfunction (impaired relaxation). Elevated left atrial pressure. 2. Right ventricular systolic function is normal. The right ventricular size is normal. There is normal pulmonary artery systolic pressure. 3. The mitral valve is grossly normal. Trivial mitral valve regurgitation. 4. The aortic valve is tricuspid. Aortic valve regurgitation is mild. No aortic  stenosis is present. 5. The inferior vena cava is normal in size with greater than 50% respiratory variability, suggesting right atrial pressure of 3 mmHg.     Echocardiogram 11/26/2018:   1. The left ventricle has a visually estimated ejection fraction of 75%.  The cavity size was normal. Left ventricular diastolic Doppler parameters  are consistent with impaired relaxation. Elevated left ventricular  end-diastolic pressure No evidence of  left ventricular regional wall motion abnormalities.   2. The right ventricle has normal systolic function. The cavity was  normal. There is no increase in right ventricular wall thickness.   3. The aortic valve is tricuspid. Mild thickening of the aortic valve.  Aortic valve regurgitation is mild by color flow Doppler. Mild aortic  annular calcification noted.   4. The mitral valve is abnormal. Moderate thickening of the mitral valve  leaflet. Mitral valve regurgitation is moderate by color flow Doppler.   5. Mitral chordal SAM is noted. LVOT is small. Peak gradient 145 mmHg.   6. The tricuspid valve is grossly normal. Tricuspid valve regurgitation  is moderate.   7. The aorta is normal unless otherwise noted.      Coronary CT angiography 12/03/2018:  FINDINGS: Coronary calcium score: The patient's coronary artery calcium score is 149, which places the patient in the 85th percentile. Coronary arteries: Normal coronary origins.  Left dominance Right Coronary Artery: Small, non-dominant vessel which gives off a larger AV nodal branch. No obstructive disease. Left Main Coronary Artery: Prominent left coronary cusp, but not anuerymsal. No stenosis. Gives off larger LAD and LCx arteries. Left Anterior Descending Coronary Artery: Calcified proximal vessel with mild 25-49% (CADRADS2) mixed stenosis. Gives off several small diagonal branches. Ramus Intermedius Artery: Small branch without significant stenosis.   Left Circumflex Artery: Dominant vessel that  gives off a small PDA artery. Larger caliber with minimal mixed 1-24% mid-vessel stenosis (CADRADS1).  Aorta: Normal size, 28 mm at the mid ascending aorta (level of the PA bifurcation) measured double oblique. Atherosclerosis. No dissection. Aortic Valve: Trileaflet.  Calcification of the annulus.   Other findings: Thickening of the aortic and mitral valve leaflets. Normal biatrial size Normal pulmonary vein drainage into the left atrium. Normal left atrial appendage without a thrombus. Normal size of the pulmonary artery.   IMPRESSION: 1. Mild, non-obstructive CAD primarily of the LAD, CADRADS = 2. 2. Coronary calcium score of 149. This was 85th percentile for age and sex matched control. 3. Normal coronary origin with left dominance. 4. Thickening  of the aortic and mitral valve leaflets  MCOT monitor 07/14/2017 Sinus rhythm with frequent PVCs. There was one episode of rapid atrial fibrillation.   EKG:  EKG is not ordered today.  It shows AV sequential pacing with a single premature ventricular contraction.  The R wave is positive in V1.  The QRS is however quite broad at 168 ms.  QTc 522 ms.  Recent Labs: 12/15/2020: B Natriuretic Peptide 1,765.0 12/22/2020: Magnesium 2.0 01/08/2021: ALT 18; BUN 9; Creatinine 0.89; Hemoglobin 10.7; Platelet Count 246; Potassium 3.8; Sodium 143 03/19/2021 hemoglobin 13.5  Recent Lipid Panel No results found for: CHOL, TRIG, HDL, CHOLHDL, VLDL, LDLCALC, LDLDIRECT 10/07/2019 Total cholesterol 171, HDL 56, LDL 86, triglycerides 145  10/10/2020 Cholesterol 164, HDL 48, triglycerides 178, LDL 86  Physical Exam:    VS:  BP 136/75 (BP Location: Left Arm, Patient Position: Sitting, Cuff Size: Normal)    Pulse 78    Ht 5\' 3"  (1.6 m)    Wt 137 lb 12.8 oz (62.5 kg)    SpO2 97%    BMI 24.41 kg/m     Wt Readings from Last 3 Encounters:  05/07/21 137 lb 12.8 oz (62.5 kg)  02/05/21 131 lb 3.2 oz (59.5 kg)  01/17/21 134 lb (60.8 kg)      General:  Alert, oriented x3, no distress, healthy left subclavian pacemaker site Head: no evidence of trauma, PERRL, EOMI, no exophtalmos or lid lag, no myxedema, no xanthelasma; normal ears, nose and oropharynx Neck: normal jugular venous pulsations and no hepatojugular reflux; brisk carotid pulses without delay and no carotid bruits Chest: clear to auscultation, no signs of consolidation by percussion or palpation, normal fremitus, symmetrical and full respiratory excursions Cardiovascular: normal position and quality of the apical impulse, regular rhythm, normal first and second heart sounds, no murmurs, rubs or gallops.  Unable to induce murmur even with a Valsalva maneuver.  I did not hear a murmur following the single PVC that occurred during examination. Abdomen: no tenderness or distention, no masses by palpation, no abnormal pulsatility or arterial bruits, normal bowel sounds, no hepatosplenomegaly Extremities: no clubbing, cyanosis or edema; 2+ radial, ulnar and brachial pulses bilaterally; 2+ right femoral, posterior tibial and dorsalis pedis pulses; 2+ left femoral, posterior tibial and dorsalis pedis pulses; no subclavian or femoral bruits Neurological: grossly nonfocal Psych: Normal mood and affect  ASSESSMENT:    1. SSS (sick sinus syndrome) ([place])   2. HOCM (hypertrophic obstructive cardiomyopathy) ([place])   3. Pacemaker   4. Essential hypertension   5. Paroxysmal atrial fibrillation (HCC)   6. Acquired thrombophilia ([place])   7. Iron deficiency anemia due to chronic blood loss   8. Nonobstructive atherosclerosis of coronary artery   9. Hypercholesterolemia      PLAN:    In order of problems listed above:  HOCM: No evidence of inducible obstruction by physical exam today, even following PVCs as was the case in the past.  The combination of increased ventricular pacing and higher dose of beta-blocker seems to be helping.  No high risk arrhythmias recorded by her pacemaker. SSS: Heart  rate histogram distribution appears appropriate.  No changes were made to the rate response sensor today. Pacemaker: Normal device function.  Plan downloads every 3 months. HTN:   It appears the effects of bisoprolol are wearing off before the end of the day.  We will add a second daily dose for total of 15 mg daily.  Avoid diuretics and potent vasodilators due to increased LV  outflow tract obstruction. AFib: Very low burden of arrhythmia.  On appropriate anticoagulation. CHADSVasc 4 (age, gender, HTN, +/- CAD), probably irrelevant in setting of HOCM. Anticoagulation: Treatment of Eliquis led to diagnosis of colon cancer.  Now back on anticoagulation without any bleeding problems. Anemia: Resolved Elevated coronary calcium score: She did not have any angiographically significant coronary stenoses at catheterization but has significant atherosclerotic plaque burden on CT.  No angina.  On statin.  Would like to bring her LDL preferably down to less than 70.  Has repeat labs scheduled in a few months with Dr. Jimmye Norman. HLP: If LDL remains higher than 70, will suggest switching from simvastatin to either atorvastatin or rosuvastatin   Medication Adjustments/Labs and Tests Ordered: Current medicines are reviewed at length with the patient today.  Concerns regarding medicines are outlined above.  Orders Placed This Encounter  Procedures   EKG 12-Lead    Meds ordered this encounter  Medications   bisoprolol (ZEBETA) 10 MG tablet    Sig: Take 10 mg (one tablet) in the morning and 5 mg (half a tablet) in the evening.    Dispense:  45 tablet    Refill:  5     Patient Instructions  Medication Instructions:  BISOPROLOL- Take 10 mg (one tablet) in the morning and take 5 mg (half a tablet) in the evening.  *If you need a refill on your cardiac medications before your next appointment, please call your pharmacy*   Lab Work: None ordered If you have labs (blood work) drawn today and your tests are  completely normal, you will receive your results only by: [place] (if you have MyChart) OR A paper copy in the mail If you have any lab test that is abnormal or we need to change your treatment, we will call you to review the results.   Testing/Procedures: NOne ordered   Follow-Up: At [facility], you and your health needs are our priority.  As part of our continuing mission to provide you with exceptional heart care, we have created designated Provider Care Teams.  These Care Teams include your primary Cardiologist (physician) and Advanced Practice Providers (APPs -  Physician Assistants and Nurse Practitioners) who all work together to provide you with the care you need, when you need it.  We recommend signing up for the patient portal called "MyChart".  Sign up information is provided on this After Visit Summary.  MyChart is used to connect with patients for Virtual Visits (Telemedicine).  Patients are able to view lab/test results, encounter notes, upcoming appointments, etc.  Non-urgent messages can be sent to your provider as well.   To learn more about what you can do with MyChart, go to [email or web address].    Your next appointment:   6 month(s)  The format for your next appointment:   In Person  Provider:   Sanda Klein, MD {     Signed, Sanda Klein, MD  05/08/2021 5:43 PM    [place]

## 2021-05-17 ENCOUNTER — Telehealth: Payer: Self-pay | Admitting: Cardiology

## 2021-05-17 NOTE — Telephone Encounter (Signed)
On-call cardiology Received call from pt saying that she has noted her HR to be higher than usual this evening, starting out in the 140s, more recently 110s-120s. She takes bisoprolol daily, typically 10mg  daily, but has taken an extra dose around 6pm this evening for a total today of bisoprolol 20mg .  She says she feels OK, and doesn't feel like she needs to go to the ED if possible. She is on chronic eliquis for CVA prophylaxis. I have told her to contact the clinic in the AM for further instruction and she voices understanding. If she begins to feel unwell, I advised her to go to the ED for further evaluation. Will notify pt's primary cardiologist, Dr. Sallyanne Kuster.  Rudean Curt, MD , [facility] 05/17/21 11:14 PM

## 2021-05-18 ENCOUNTER — Telehealth: Payer: Self-pay | Admitting: Cardiovascular Disease

## 2021-05-18 MED ORDER — DILTIAZEM HCL 30 MG PO TABS: 30.0000 mg | ORAL_TABLET | Freq: Three times a day (TID) | ORAL | 3 refills | Status: DC | PRN

## 2021-05-18 NOTE — Telephone Encounter (Signed)
Patient has been made aware. Diltiazem 30 mg prn has been sent in for her.

## 2021-05-18 NOTE — Telephone Encounter (Signed)
PT reaching out saying that she had an occurrence of AFIB last night and had to speak w/ ED nurse in regards to it. She says there was a medication that she took some time ago that assisted with this and would like to speak w/ a nurse to see if this is something she can take if this happens again... please advise

## 2021-05-18 NOTE — Telephone Encounter (Signed)
Patient is following up. She states she would like a call back to discuss her transmission.

## 2021-05-18 NOTE — Telephone Encounter (Signed)
Thanks, Alexandria French No atrial fibrillation has been recorded. She may be experiencing intermittent arrhythmia, such as PACs or PVCs. Sustained release diltiazem will not be a good medication to use "as needed" since it is so long acting and slow acting. We can give her a supply of diltiazem immediate release 30 mg to take as needed up to 3 times a day.

## 2021-05-18 NOTE — Telephone Encounter (Signed)
Spoke with the patient concerning her episode from early this morning (please see other epic note). The patient stated that this lasted a few hours. She still has Diltiazem 120 mg capsules at home and wanted to know if she could take that PRN or if there was an PRN medication she could try for these episodes.  She stated that she is fine this morning and is asymptomatic.  She will send a transmission for review.

## 2021-05-18 NOTE — Addendum Note (Signed)
Addended by: Ricci Barker on: 05/18/2021 03:50 PM   Modules accepted: Orders

## 2021-05-18 NOTE — Telephone Encounter (Signed)
As long as she is not feeling bad, no reason to go to the ED.  Can we please do a download from her pacemaker and this will tell us what type of tachycardia she is experiencing.

## 2021-05-28 ENCOUNTER — Ambulatory Visit (INDEPENDENT_AMBULATORY_CARE_PROVIDER_SITE_OTHER): Payer: Medicare PPO

## 2021-05-28 DIAGNOSIS — I495 Sick sinus syndrome: Secondary | ICD-10-CM | POA: Diagnosis not present

## 2021-05-29 LAB — CUP PACEART REMOTE DEVICE CHECK
Battery Remaining Longevity: 120 mo
Battery Remaining Percentage: 100 %
Brady Statistic RA Percent Paced: 97 %
Brady Statistic RV Percent Paced: 95 %
Date Time Interrogation Session: 20230227021200
Implantable Lead Implant Date: 20211129
Implantable Lead Implant Date: 20211129
Implantable Lead Location: 753859
Implantable Lead Location: 753860
Implantable Lead Model: 7840
Implantable Lead Model: 7841
Implantable Lead Serial Number: 1036898
Implantable Lead Serial Number: 1100104
Implantable Pulse Generator Implant Date: 20211129
Lead Channel Impedance Value: 1009 Ohm
Lead Channel Impedance Value: 696 Ohm
Lead Channel Pacing Threshold Amplitude: 0.4 V
Lead Channel Pacing Threshold Amplitude: 0.8 V
Lead Channel Pacing Threshold Pulse Width: 0.4 ms
Lead Channel Pacing Threshold Pulse Width: 0.4 ms
Lead Channel Setting Pacing Amplitude: 3.5 V
Lead Channel Setting Pacing Amplitude: 3.5 V
Lead Channel Setting Pacing Pulse Width: 0.4 ms
Lead Channel Setting Sensing Sensitivity: 2.5 mV
Pulse Gen Serial Number: 951099

## 2021-06-04 NOTE — Progress Notes (Signed)
Remote pacemaker transmission.   

## 2021-07-04 DIAGNOSIS — L308 Other specified dermatitis: Secondary | ICD-10-CM | POA: Diagnosis not present

## 2021-07-04 DIAGNOSIS — L821 Other seborrheic keratosis: Secondary | ICD-10-CM | POA: Diagnosis not present

## 2021-07-23 DIAGNOSIS — Z6825 Body mass index (BMI) 25.0-25.9, adult: Secondary | ICD-10-CM | POA: Diagnosis not present

## 2021-07-23 DIAGNOSIS — Z862 Personal history of diseases of the blood and blood-forming organs and certain disorders involving the immune mechanism: Secondary | ICD-10-CM | POA: Diagnosis not present

## 2021-07-23 DIAGNOSIS — I421 Obstructive hypertrophic cardiomyopathy: Secondary | ICD-10-CM | POA: Diagnosis not present

## 2021-07-23 DIAGNOSIS — R42 Dizziness and giddiness: Secondary | ICD-10-CM | POA: Diagnosis not present

## 2021-07-23 DIAGNOSIS — I10 Essential (primary) hypertension: Secondary | ICD-10-CM | POA: Diagnosis not present

## 2021-08-12 ENCOUNTER — Other Ambulatory Visit: Payer: Self-pay | Admitting: Family

## 2021-08-13 NOTE — Telephone Encounter (Signed)
Prescription refill request for Eliquis received. ?Indication:Afib ?Last office visit:2/23 ?Scr:0.8 ?Age: 71 ?Weight:62.5 kg ? ?Prescription refilled ? ?

## 2021-08-28 ENCOUNTER — Ambulatory Visit (INDEPENDENT_AMBULATORY_CARE_PROVIDER_SITE_OTHER): Payer: Medicare PPO

## 2021-08-28 DIAGNOSIS — I495 Sick sinus syndrome: Secondary | ICD-10-CM | POA: Diagnosis not present

## 2021-08-28 LAB — CUP PACEART REMOTE DEVICE CHECK
Battery Remaining Longevity: 114 mo
Battery Remaining Percentage: 100 %
Brady Statistic RA Percent Paced: 89 %
Brady Statistic RV Percent Paced: 89 %
Date Time Interrogation Session: 20230530021100
Implantable Lead Implant Date: 20211129
Implantable Lead Implant Date: 20211129
Implantable Lead Location: 753859
Implantable Lead Location: 753860
Implantable Lead Model: 7840
Implantable Lead Model: 7841
Implantable Lead Serial Number: 1036898
Implantable Lead Serial Number: 1100104
Implantable Pulse Generator Implant Date: 20211129
Lead Channel Impedance Value: 1013 Ohm
Lead Channel Impedance Value: 691 Ohm
Lead Channel Pacing Threshold Amplitude: 0.5 V
Lead Channel Pacing Threshold Amplitude: 0.7 V
Lead Channel Pacing Threshold Pulse Width: 0.4 ms
Lead Channel Pacing Threshold Pulse Width: 0.4 ms
Lead Channel Setting Pacing Amplitude: 3.5 V
Lead Channel Setting Pacing Amplitude: 3.5 V
Lead Channel Setting Pacing Pulse Width: 0.4 ms
Lead Channel Setting Sensing Sensitivity: 2.5 mV
Pulse Gen Serial Number: 951099

## 2021-09-11 NOTE — Progress Notes (Signed)
Remote pacemaker transmission.   

## 2021-10-04 ENCOUNTER — Telehealth: Payer: Self-pay | Admitting: Internal Medicine

## 2021-10-04 NOTE — Telephone Encounter (Signed)
Error

## 2021-11-05 ENCOUNTER — Encounter: Payer: Self-pay | Admitting: Cardiovascular Disease

## 2021-11-05 ENCOUNTER — Ambulatory Visit (INDEPENDENT_AMBULATORY_CARE_PROVIDER_SITE_OTHER): Payer: Medicare PPO | Admitting: Cardiovascular Disease

## 2021-11-05 VITALS — BP 138/88 | HR 68 | Ht 63.0 in | Wt 139.0 lb

## 2021-11-05 DIAGNOSIS — E7801 Familial hypercholesterolemia: Secondary | ICD-10-CM | POA: Diagnosis not present

## 2021-11-05 DIAGNOSIS — I2510 Atherosclerotic heart disease of native coronary artery without angina pectoris: Secondary | ICD-10-CM

## 2021-11-05 DIAGNOSIS — D6869 Other thrombophilia: Secondary | ICD-10-CM | POA: Diagnosis not present

## 2021-11-05 DIAGNOSIS — I495 Sick sinus syndrome: Secondary | ICD-10-CM | POA: Diagnosis not present

## 2021-11-05 DIAGNOSIS — I480 Paroxysmal atrial fibrillation: Secondary | ICD-10-CM | POA: Diagnosis not present

## 2021-11-05 DIAGNOSIS — E7800 Pure hypercholesterolemia, unspecified: Secondary | ICD-10-CM | POA: Diagnosis not present

## 2021-11-05 DIAGNOSIS — Z1329 Encounter for screening for other suspected endocrine disorder: Secondary | ICD-10-CM | POA: Diagnosis not present

## 2021-11-05 DIAGNOSIS — I10 Essential (primary) hypertension: Secondary | ICD-10-CM

## 2021-11-05 DIAGNOSIS — Z950 Presence of cardiac pacemaker: Secondary | ICD-10-CM | POA: Diagnosis not present

## 2021-11-05 DIAGNOSIS — R5383 Other fatigue: Secondary | ICD-10-CM | POA: Diagnosis not present

## 2021-11-05 DIAGNOSIS — I421 Obstructive hypertrophic cardiomyopathy: Secondary | ICD-10-CM | POA: Diagnosis not present

## 2021-11-05 DIAGNOSIS — Z0001 Encounter for general adult medical examination with abnormal findings: Secondary | ICD-10-CM | POA: Diagnosis not present

## 2021-11-05 DIAGNOSIS — D509 Iron deficiency anemia, unspecified: Secondary | ICD-10-CM | POA: Diagnosis not present

## 2021-11-05 DIAGNOSIS — Z862 Personal history of diseases of the blood and blood-forming organs and certain disorders involving the immune mechanism: Secondary | ICD-10-CM | POA: Diagnosis not present

## 2021-11-05 DIAGNOSIS — Z13220 Encounter for screening for lipoid disorders: Secondary | ICD-10-CM | POA: Diagnosis not present

## 2021-11-05 NOTE — Patient Instructions (Signed)
Medication Instructions:  No changes *If you need a refill on your cardiac medications before your next appointment, please call your pharmacy*   Lab Work: None ordered If you have labs (blood work) drawn today and your tests are completely normal, you will receive your results only by: MyChart Message (if you have MyChart) OR A paper copy in the mail If you have any lab test that is abnormal or we need to change your treatment, we will call you to review the results.   Testing/Procedures: None ordered   Follow-Up: At [facility], you and your health needs are our priority.  As part of our continuing mission to provide you with exceptional heart care, we have created designated Provider Care Teams.  These Care Teams include your primary Cardiologist (physician) and Advanced Practice Providers (APPs -  Physician Assistants and Nurse Practitioners) who all work together to provide you with the care you need, when you need it.  We recommend signing up for the patient portal called "MyChart".  Sign up information is provided on this After Visit Summary.  MyChart is used to connect with patients for Virtual Visits (Telemedicine).  Patients are able to view lab/test results, encounter notes, upcoming appointments, etc.  Non-urgent messages can be sent to your provider as well.   To learn more about what you can do with MyChart, go to [email or web address].    Your next appointment:   6 month(s)  The format for your next appointment:   In Person  Provider:   Mihai Croitoru, MD {    Important Information About Sugar       

## 2021-11-05 NOTE — Progress Notes (Addendum)
Cardiology Office Note:    Date:  11/05/2021   ID:  Alexandria French, DOB 03/30/1951, MRN [id number]  PCP:  Edgefield Nation, MD  [facility] Cardiologist:  Masiya Claassen [facility] Electrophysiologist:  None   Referring MD:  Nation, MD   Chief Complaint  Patient presents with   Cardiomyopathy   Atrial Fibrillation   Pacemaker Check     History of Present Illness:    Alexandria French is a 71 y.o. female with a hx of paroxysmal atrial fibrillation, left bundle branch block, HOCM, sinus bradycardia, elevated coronary calcium score without significant coronary stenosis by coronary CT angiogram (12/2018).  Feels better on beta-blockers, but the dose was limited by bradycardia.  Underwent pacemaker implantation in late November 2021.  Symptomatic anemia following initiation of anticoagulants led to a diagnosis of adenocarcinoma of the right colon for which she underwent hemicolectomy in 2022.  Overall seems to have achieved some degree of symptom stability.  She has NYHA functional class II exertional dyspnea (has to stop to catch her breath at the top of a flight of stairs).  She rarely has dizziness and has not experienced syncope or palpitations.  She has mild ankle edema towards the end of the day that resolves by the following morning.  Does complain of fatigue, for example when taking close out of the dryer.  Pacemaker interrogation generally shows normal device function.  Battery is essentially still at beginning of life, predicted longevity 9.5 years.  All lead parameters are excellent.  She has not had any atrial fibrillation since the last download.  Very brief episode of nonsustained VT was recorded.  The AV delays are purposely programmed short to increase the frequency of ventricular pacing and prevent LV outflow tract obstruction.  She has 87% atrial pacing (increased from last download) and 88% ventricular pacing (also increased from last download).  Her heart rate  histograms are rather blunted.  Increased minute ventilation sensor response from 10 to 12 and reduced accelerometer threshold from medium-low to low today.  In September 2022 she presented with severe symptomatic anemia (hemoglobin around 8) and was found to have a adenocarcinoma of the right colon.  She underwent a partial ileocolectomy on 12/22/2020. She will not require chemotherapy.  Labs showed findings consistent with iron deficiency.  Her most recent hemoglobin on October 10 was 10.7.  Her gastroenterologist is Dr. Carlean Purl.  Her surgeon was Dr. Ninfa Linden.  During the hospitalization she had problems with hypotension.  She underwent cardiac catheterization which showed angiographically normal coronary arteries and confirmed the presence of a significant LV outflow tract gradient.  She was treated with fluids and midodrine.  She subsequently stopped her midodrine since she noticed that her blood pressure was too high.  Prior to hospitalization, he had been treated with bisoprolol which she seemed to tolerate better than metoprolol.  She was discharged from the hospital metoprolol to 50 mg twice daily.  She has had periods of frequent PVCs, often a pattern of trigeminy, that have caused artifactual bradycardia and erratic blood pressure readings.  This has not been the case recently.  She wore an event monitor that showed prominent sinus bradycardia and chronotropic incompetence, with a maximum heart rate but only reached 60% of max predicted.  She did have frequent mostly monomorphic PVCs (6% of all beats).  Also had some brief bursts of nonsustained atrial tachycardia.  There was no complex ventricular arrhythmia. She has not had full syncope previously or documented VT. There is no  family history of HCM, VT, sudden death or CHF.  2 separate echocardiograms performed in August 2020 in May 2021 show findings consistent with hypertrophic obstructive cardiomyopathy, although the degree of left  ventricular hypertrophy is relatively mild and appears to be concentric rather than asymmetrical.  Systolic anterior motion of the mitral valve with increased gradients across the left ventricular outflow tract.  These have been calculated at 145 mmHg and 213 mmHg respectively, although I am not sure that the estimation is accurate.  There seems to be a lot of contamination with mitral regurgitant flow on the Doppler signal.  There is however convincing evidence of gradients across the LVOT of at least 50 mmHg at rest.  On both studies the left ventricular systolic function is hyperdynamic with ejection fraction in excess of 75%.  There is no evidence of major abnormalities of the aortic valve itself.  Coronary CT angiogram performed in September 2020 showed a calcium score in the 85th percentile with no major coronary artery stenoses.  The coronary system is left dominant.  There is very mild bilateral carotid artery plaque with stenoses less than 40%.  Atrial fibrillation was reportedly detected on a previous cardiac monitor in June 2019.  The ventricular rate was over 178 bpm.  The episode was associated with dizziness and lightheadedness.  I am not sure it can accurately be categorized as atrial fibrillation since it was so brief, but she definitely had at least paroxysmal atrial tachycardia.  Although frequent PVCs including ventricular bigeminy were recorded, there was no evidence of ventricular tachycardia.  She has a history of essential hypertension.  She presented with profound anemia and hypotension and was found to have a right colon adenocarcinoma for which she underwent surgery December 22, 2020.  Past Medical History:  Diagnosis Date   A-fib [facility])    CAD (coronary artery disease)    Diverticulosis    Esophageal reflux    no meds   External hemorrhoids    Gastritis    Hypertension    IDA (iron deficiency anemia)    Internal hemorrhoids    Other and unspecified hyperlipidemia     SSS (sick sinus syndrome) ([place])     Past Surgical History:  Procedure Laterality Date   ABDOMINAL HYSTERECTOMY  08/2008   BIOPSY  12/20/2020   Procedure: BIOPSY;  Surgeon: Gatha Mayer, MD;  Location: [place];  Service: Endoscopy;;   BLADDER REPAIR     COLONOSCOPY WITH PROPOFOL N/A 12/20/2020   Procedure: COLONOSCOPY WITH PROPOFOL;  Surgeon: Gatha Mayer, MD;  Location: [facility] ENDOSCOPY;  Service: Endoscopy;  Laterality: N/A;   LAPAROSCOPIC PARTIAL COLECTOMY N/A 12/22/2020   Procedure: LAPAROSCOPIC ASSISTED PARTIAL COLECTOMY;  Surgeon: Coralie Keens, MD;  Location: [place];  Service: General;  Laterality: N/A;   PACEMAKER IMPLANT N/A 02/28/2020   Procedure: PACEMAKER IMPLANT;  Surgeon: Sanda Klein, MD;  Location: [place] [facility];  Service: Cardiovascular;  Laterality: N/A;   RIGHT/LEFT HEART CATH AND CORONARY ANGIOGRAPHY N/A 12/15/2020   Procedure: RIGHT/LEFT HEART CATH AND CORONARY ANGIOGRAPHY;  Surgeon: Leonie Man, MD;  Location: [place] [facility] LAB;  Service: Cardiovascular;  Laterality: N/A;   TONSILLECTOMY      Current Medications: Current Meds  Medication Sig   bisoprolol (ZEBETA) 10 MG tablet Take 10 mg (one tablet) in the morning and 5 mg (half a tablet) in the evening.   Calcium Carbonate-Vitamin D 600-400 MG-UNIT tablet Take 1 tablet by mouth 2 (two) times daily.  cetirizine (ZYRTEC) 10 MG tablet Take 10 mg by mouth every evening.    ELIQUIS 5 MG TABS tablet TAKE 1 TABLET BY MOUTH TWICE A DAY   ferrous sulfate 325 (65 FE) MG EC tablet Take 325 mg by mouth in the morning and at bedtime.   simvastatin (ZOCOR) 20 MG tablet Take 20 mg by mouth daily.   vitamin C (ASCORBIC ACID) 500 MG tablet Take 500 mg by mouth daily.     Allergies:   Cefzil [cefprozil]   Social History   Socioeconomic History   Marital status: Married    Spouse name: Not on file   Number of children: Not on file   Years of education: Not on file   Highest education level: Not on  file  Occupational History   Not on file  Tobacco Use   Smoking status: Never   Smokeless tobacco: Never  Vaping Use   Vaping Use: Never used  Substance and Sexual Activity   Alcohol use: No    Alcohol/week: 0.0 standard drinks of alcohol   Drug use: No   Sexual activity: Not on file  Other Topics Concern   Not on file  Social History Narrative   Not on file   Social Determinants of Health   Financial Resource Strain: Not on file  Food Insecurity: Not on file  Transportation Needs: Not on file  Physical Activity: Not on file  Stress: Not on file  Social Connections: Not on file     Family History: The patient's family history includes Arthritis in an other family member; Heart disease in an other family member; Osteoporosis in an other family member. There is no history of Colon cancer or Colon polyps.  Father lived to 79 years old, has an older sister 74 years old who is healthy, had another sister died at age 85 of a lung infection.  1 child (son), healthy.  ROS:   Please see the history of present illness. All other systems are reviewed and are negative.   EKGs/Labs/Other Studies Reviewed:    The following studies were reviewed today: Cardiac Cath 12/15/2020    LV end diastolic pressure is severely elevated.   Hemodynamic findings consistent with mild pulmonary hypertension.   SUMMARY Angiographically Normal Coronary arteries with a left dominant system. Significant LVOT gradient with apical LV pressures of 184/12 mmHg with an EDP of 25, mid LV cavity 157/9 mmHg with EDP of 25 and outflow tract pressure of 111/9 mmHg with a stable EDP of 24 mmHg. Mild Secondary Pulmonary Hypertension with mean PAP of 29 mmHg and PCWP of 29 mmHg. Moderately reduced cardiac function with a cardiac output-index of 3.26 and 2.02 by Fick and 3.55-2.2 by thermodilution.     RECOMMENDATIONS Gentle titration of medications to allow for diuresis without leading to systemic  hypotension. Defer decision making to rounding cardiology service   Echocardiogram 12/15/2020   1. Left ventricular ejection fraction, by estimation, is 55-60%. The left  ventricle demonstrates regional wall motion abnormalities that are  consistent with a left bundle branch block.There is severe asymmetric left  ventricular hypertrophy. Left  ventricular diastolic parameters are consistent with Grade I diastolic  dysfunction (impaired relaxation). No significant LVOT Obstructive seen in  this study.   2. Right ventricular systolic function is normal. The right ventricular  size is normal. There is normal pulmonary artery systolic pressure.   3. The mitral valve is normal in structure. Mild mitral valve  regurgitation. No evidence of mitral stenosis.  4. The aortic valve was not well visualized. Aortic valve regurgitation  is trivial. No aortic stenosis is present.   5. The inferior vena cava is normal in size with <50% respiratory  variability, suggesting right atrial pressure of 8 mmHg.   6. Technically difficult acquisition; in future studies consider use of  echo-contrast.   Comparison(s): A prior study was performed on 08/27/19. Prior images  reviewed side by side. Compared to prior septal wall motion is new. LVOT  obstructive more prominent on prior study.   Conclusion(s)/Recommendation(s): Consider cardiac MRI for assessment of  hypertrophic cardioymyopathy.  Echocardiogram 08/27/2019   1. LVOT peak gradient 213 mmHg. Left ventricular ejection fraction, by estimation, is 70 to 75%. The left ventricle has hyperdynamic function. The left ventricle has no regional wall motion abnormalities. There is mild concentric left ventricular hypertrophy. Left ventricular diastolic parameters are consistent with Grade I diastolic dysfunction (impaired relaxation). Elevated left atrial pressure. 2. Right ventricular systolic function is normal. The right ventricular size is normal. There  is normal pulmonary artery systolic pressure. 3. The mitral valve is grossly normal. Trivial mitral valve regurgitation. 4. The aortic valve is tricuspid. Aortic valve regurgitation is mild. No aortic stenosis is present. 5. The inferior vena cava is normal in size with greater than 50% respiratory variability, suggesting right atrial pressure of 3 mmHg.     Echocardiogram 11/26/2018:   1. The left ventricle has a visually estimated ejection fraction of 75%.  The cavity size was normal. Left ventricular diastolic Doppler parameters  are consistent with impaired relaxation. Elevated left ventricular  end-diastolic pressure No evidence of  left ventricular regional wall motion abnormalities.   2. The right ventricle has normal systolic function. The cavity was  normal. There is no increase in right ventricular wall thickness.   3. The aortic valve is tricuspid. Mild thickening of the aortic valve.  Aortic valve regurgitation is mild by color flow Doppler. Mild aortic  annular calcification noted.   4. The mitral valve is abnormal. Moderate thickening of the mitral valve  leaflet. Mitral valve regurgitation is moderate by color flow Doppler.   5. Mitral chordal SAM is noted. LVOT is small. Peak gradient 145 mmHg.   6. The tricuspid valve is grossly normal. Tricuspid valve regurgitation  is moderate.   7. The aorta is normal unless otherwise noted.      Coronary CT angiography 12/03/2018:  FINDINGS: Coronary calcium score: The patient's coronary artery calcium score is 149, which places the patient in the 85th percentile. Coronary arteries: Normal coronary origins.  Left dominance Right Coronary Artery: Small, non-dominant vessel which gives off a larger AV nodal branch. No obstructive disease. Left Main Coronary Artery: Prominent left coronary cusp, but not anuerymsal. No stenosis. Gives off larger LAD and LCx arteries. Left Anterior Descending Coronary Artery: Calcified proximal vessel  with mild 25-49% (CADRADS2) mixed stenosis. Gives off several small diagonal branches. Ramus Intermedius Artery: Small branch without significant stenosis.   Left Circumflex Artery: Dominant vessel that gives off a small PDA artery. Larger caliber with minimal mixed 1-24% mid-vessel stenosis (CADRADS1).  Aorta: Normal size, 28 mm at the mid ascending aorta (level of the PA bifurcation) measured double oblique. Atherosclerosis. No dissection. Aortic Valve: Trileaflet.  Calcification of the annulus.   Other findings: Thickening of the aortic and mitral valve leaflets. Normal biatrial size Normal pulmonary vein drainage into the left atrium. Normal left atrial appendage without a thrombus. Normal size of the pulmonary artery.   IMPRESSION: 1.  Mild, non-obstructive CAD primarily of the LAD, CADRADS = 2. 2. Coronary calcium score of 149. This was 85th percentile for age and sex matched control. 3. Normal coronary origin with left dominance. 4. Thickening of the aortic and mitral valve leaflets  MCOT monitor 07/14/2017 Sinus rhythm with frequent PVCs. There was one episode of rapid atrial fibrillation.   EKG:  EKG is ordered today.  It shows AV sequential pacing and positive R waves in lead V1-V2 with a broad paced QRS at 166 ms.  QTc 491 ms.   Recent Labs: 12/15/2020: B Natriuretic Peptide 1,765.0 12/22/2020: Magnesium 2.0 01/08/2021: ALT 18; BUN 9; Creatinine 0.89; Hemoglobin 10.7; Platelet Count 246; Potassium 3.8; Sodium 143 07/23/2021 Hemoglobin 14.1, creatinine 0.82, potassium 4.4, ALT 21  Recent Lipid Panel No results found for: "CHOL", "TRIG", "HDL", "CHOLHDL", "VLDL", "LDLCALC", "LDLDIRECT" 10/07/2019 Total cholesterol 171, HDL 56, LDL 86, triglycerides 145  10/10/2020 Cholesterol 164, HDL 48, triglycerides 178, LDL 86  Physical Exam:    VS:  BP 138/88 (BP Location: Left Arm, Patient Position: Sitting, Cuff Size: Normal)   Pulse 68   Ht '5\' 3"'$  (1.6 m)   Wt 139 lb (63 kg)    SpO2 93%   BMI 24.62 kg/m     Wt Readings from Last 3 Encounters:  11/05/21 139 lb (63 kg)  05/07/21 137 lb 12.8 oz (62.5 kg)  02/05/21 131 lb 3.2 oz (59.5 kg)       General: Alert, oriented x3, no distress, L subclavian pacemaker site is healthy. Head: no evidence of trauma, PERRL, EOMI, no exophtalmos or lid lag, no myxedema, no xanthelasma; normal ears, nose and oropharynx Neck: normal jugular venous pulsations and no hepatojugular reflux; brisk carotid pulses without delay and no carotid bruits Chest: clear to auscultation, no signs of consolidation by percussion or palpation, normal fremitus, symmetrical and full respiratory excursions Cardiovascular: normal position and quality of the apical impulse, regular rhythm, normal first and second heart sounds, no murmurs, rubs or gallops.  Could not elicit a murmur even with a Valsalva maneuver. Abdomen: no tenderness or distention, no masses by palpation, no abnormal pulsatility or arterial bruits, normal bowel sounds, no hepatosplenomegaly Extremities: no clubbing, cyanosis or edema; 2+ radial, ulnar and brachial pulses bilaterally; 2+ right femoral, posterior tibial and dorsalis pedis pulses; 2+ left femoral, posterior tibial and dorsalis pedis pulses; no subclavian or femoral bruits Neurological: grossly nonfocal Psych: Normal mood and affect   ASSESSMENT:    1. HOCM (hypertrophic obstructive cardiomyopathy) ([place])   2. SSS (sick sinus syndrome) ([place])   3. Pacemaker   4. Essential hypertension   5. Paroxysmal atrial fibrillation (HCC)   6. Acquired thrombophilia ([place])   7. Nonobstructive atherosclerosis of coronary artery   8. Hypercholesterolemia       PLAN:    In order of problems listed above:  HOCM: There is no audible murmur of inducible LV outflow tract obstruction by physical exam today.  A very brief episode of nonsustained VT is seen.  NYHA functional class II status, not sure that this is due to diastolic left  ventricular dysfunction or chronotropic incompetence.   SSS: Heart rate histogram distribution appears blunted.  Made adjustments to the sensor settings today.  Minute ventilation response was increased from 10 to 12 and the accelerometer based intervention threshold was decreased from medium-low to low. Pacemaker: Normal device function.  Reprogramming as described above.  Plan downloads every 3 months.  May need to perform additional sensor settings adjustments. HTN:  Well-controlled on current medications.  Avoid diuretics and potent vasodilators due to the risk of increased LV outflow tract obstruction. AFib: Continues to be very infrequent.  On appropriate anticoagulation. CHADSVasc 4 (age, gender, HTN, +/- CAD), but this score is probably irrelevant in setting of HOCM. Anticoagulation: Treatment of Eliquis led to diagnosis of colon cancer.  Currently without any bleeding problems and with normalized blood counts. CAD: She did not have any angiographically significant coronary stenoses at catheterization but has significant atherosclerotic plaque burden on CT.  No angina.  On statin.  Target LDL less than 70.  Scheduled to have repeat labs with PCP. HLP: If LDL remains higher than 70, recommend switching from simvastatin to either atorvastatin or rosuvastatin.  She would like to have a provider that knows her in [facility], although she still wants to come down to [facility] for most of her cardiology checkups.  She would feel safer if she had a provider closer to home.  We will try to get her an appointment with Bernerd Pho, [place].   Medication Adjustments/Labs and Tests Ordered: Current medicines are reviewed at length with the patient today.  Concerns regarding medicines are outlined above.  Orders Placed This Encounter  Procedures   EKG 12-Lead    No orders of the defined types were placed in this encounter.    Patient Instructions  Medication Instructions:  No  changes *If you need a refill on your cardiac medications before your next appointment, please call your pharmacy*   Lab Work: None ordered If you have labs (blood work) drawn today and your tests are completely normal, you will receive your results only by: [place] (if you have MyChart) OR A paper copy in the mail If you have any lab test that is abnormal or we need to change your treatment, we will call you to review the results.   Testing/Procedures: None ordered   Follow-Up: At [facility], you and your health needs are our priority.  As part of our continuing mission to provide you with exceptional heart care, we have created designated Provider Care Teams.  These Care Teams include your primary Cardiologist (physician) and Advanced Practice Providers (APPs -  Physician Assistants and Nurse Practitioners) who all work together to provide you with the care you need, when you need it.  We recommend signing up for the patient portal called "MyChart".  Sign up information is provided on this After Visit Summary.  MyChart is used to connect with patients for Virtual Visits (Telemedicine).  Patients are able to view lab/test results, encounter notes, upcoming appointments, etc.  Non-urgent messages can be sent to your provider as well.   To learn more about what you can do with MyChart, go to [email or web address].    Your next appointment:   6 month(s)  The format for your next appointment:   In Person  Provider:   Sanda Klein, MD {    Important Information About Sugar         Signed, Sanda Klein, MD  11/05/2021 3:38 PM    [place]

## 2021-11-08 DIAGNOSIS — N393 Stress incontinence (female) (male): Secondary | ICD-10-CM | POA: Diagnosis not present

## 2021-11-08 DIAGNOSIS — I10 Essential (primary) hypertension: Secondary | ICD-10-CM | POA: Diagnosis not present

## 2021-11-08 DIAGNOSIS — G2581 Restless legs syndrome: Secondary | ICD-10-CM | POA: Diagnosis not present

## 2021-11-08 DIAGNOSIS — I421 Obstructive hypertrophic cardiomyopathy: Secondary | ICD-10-CM | POA: Diagnosis not present

## 2021-11-08 DIAGNOSIS — Z0001 Encounter for general adult medical examination with abnormal findings: Secondary | ICD-10-CM | POA: Diagnosis not present

## 2021-11-08 DIAGNOSIS — G4700 Insomnia, unspecified: Secondary | ICD-10-CM | POA: Diagnosis not present

## 2021-11-08 DIAGNOSIS — R5383 Other fatigue: Secondary | ICD-10-CM | POA: Diagnosis not present

## 2021-11-08 DIAGNOSIS — D509 Iron deficiency anemia, unspecified: Secondary | ICD-10-CM | POA: Diagnosis not present

## 2021-11-08 DIAGNOSIS — I447 Left bundle-branch block, unspecified: Secondary | ICD-10-CM | POA: Diagnosis not present

## 2021-11-26 ENCOUNTER — Ambulatory Visit: Payer: Medicare PPO

## 2021-11-27 LAB — CUP PACEART REMOTE DEVICE CHECK
Battery Remaining Longevity: 108 mo
Battery Remaining Percentage: 100 %
Brady Statistic RA Percent Paced: 96 %
Brady Statistic RV Percent Paced: 97 %
Date Time Interrogation Session: 20230829114300
Implantable Lead Implant Date: 20211129
Implantable Lead Implant Date: 20211129
Implantable Lead Location: 753859
Implantable Lead Location: 753860
Implantable Lead Model: 7840
Implantable Lead Model: 7841
Implantable Lead Serial Number: 1036898
Implantable Lead Serial Number: 1100104
Implantable Pulse Generator Implant Date: 20211129
Lead Channel Impedance Value: 644 Ohm
Lead Channel Impedance Value: 901 Ohm
Lead Channel Pacing Threshold Amplitude: 0.5 V
Lead Channel Pacing Threshold Amplitude: 0.7 V
Lead Channel Pacing Threshold Pulse Width: 0.4 ms
Lead Channel Pacing Threshold Pulse Width: 0.4 ms
Lead Channel Setting Pacing Amplitude: 3.5 V
Lead Channel Setting Pacing Amplitude: 3.5 V
Lead Channel Setting Pacing Pulse Width: 0.4 ms
Lead Channel Setting Sensing Sensitivity: 2.5 mV
Pulse Gen Serial Number: 951099

## 2021-12-04 ENCOUNTER — Ambulatory Visit (INDEPENDENT_AMBULATORY_CARE_PROVIDER_SITE_OTHER): Payer: Medicare PPO

## 2021-12-04 DIAGNOSIS — I495 Sick sinus syndrome: Secondary | ICD-10-CM

## 2021-12-04 LAB — CUP PACEART REMOTE DEVICE CHECK
Battery Remaining Longevity: 108 mo
Battery Remaining Percentage: 100 %
Brady Statistic RA Percent Paced: 96 %
Brady Statistic RV Percent Paced: 96 %
Date Time Interrogation Session: 20230904021100
Implantable Lead Implant Date: 20211129
Implantable Lead Implant Date: 20211129
Implantable Lead Location: 753859
Implantable Lead Location: 753860
Implantable Lead Model: 7840
Implantable Lead Model: 7841
Implantable Lead Serial Number: 1036898
Implantable Lead Serial Number: 1100104
Implantable Pulse Generator Implant Date: 20211129
Lead Channel Impedance Value: 660 Ohm
Lead Channel Impedance Value: 988 Ohm
Lead Channel Pacing Threshold Amplitude: 0.5 V
Lead Channel Pacing Threshold Amplitude: 0.7 V
Lead Channel Pacing Threshold Pulse Width: 0.4 ms
Lead Channel Pacing Threshold Pulse Width: 0.4 ms
Lead Channel Setting Pacing Amplitude: 3.5 V
Lead Channel Setting Pacing Amplitude: 3.5 V
Lead Channel Setting Pacing Pulse Width: 0.4 ms
Lead Channel Setting Sensing Sensitivity: 2.5 mV
Pulse Gen Serial Number: 951099

## 2021-12-04 NOTE — Progress Notes (Signed)
Message sent to [facility] in regard to billing question.

## 2021-12-17 ENCOUNTER — Encounter: Payer: Self-pay | Admitting: *Deleted

## 2021-12-18 ENCOUNTER — Telehealth: Payer: Self-pay

## 2021-12-18 ENCOUNTER — Encounter: Payer: Self-pay | Admitting: Internal Medicine

## 2021-12-18 ENCOUNTER — Ambulatory Visit: Payer: Medicare PPO | Admitting: Internal Medicine

## 2021-12-18 VITALS — BP 124/78 | HR 70 | Ht 62.0 in | Wt 142.1 lb

## 2021-12-18 DIAGNOSIS — Z7901 Long term (current) use of anticoagulants: Secondary | ICD-10-CM | POA: Diagnosis not present

## 2021-12-18 DIAGNOSIS — D500 Iron deficiency anemia secondary to blood loss (chronic): Secondary | ICD-10-CM | POA: Diagnosis not present

## 2021-12-18 DIAGNOSIS — Z85038 Personal history of other malignant neoplasm of large intestine: Secondary | ICD-10-CM | POA: Diagnosis not present

## 2021-12-18 NOTE — H&P (View-Only) (Signed)
   Subjective:    [name], female    DOB: 1950-09-09, 71 y.o.   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  She is here alone today.  She had a colonoscopy with Dr. Carlean Purl 1 year ago during a hospitalization which revealed cecal adenocarcinoma and had a right hemicolectomy done just a few days later in September 2023.  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  All 12 lymph nodes were negative and the margins were uninvolved.  She reports that she recovered quite well.  She has remained on oral iron.  Bowel movements are urgent but controllable.  Stools are rarely completely formed but are semiformed and loose.  No diarrhea.  Trace of red blood occasionally on toilet tissue only but not in stool which she attributes to hemorrhoids.  No abdominal pain.  No upper GI or hepatobiliary complaint.   Review of Systems As per HPI, otherwise negative  Current Medications, Allergies, Past Medical History, Past Surgical History, Family History and Social History were reviewed in [employer] record.     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   Stage I colon cancer status postresection -fortunately her tumor was in an early stage and was surgically resected.  Surveillance colonoscopy indicated at this time.  We will perform this in the outpatient hospital setting on October 5.  We discussed the risk, benefits and alternatives and she is agreeable and wishes to proceed. -- Colonoscopy at [facility] in early October  2.  Chronic anticoagulation in the setting of atrial fibrillation --hold Eliquis 2 days prior to colonoscopy.  Will hold Eliquis 2 days prior to endoscopic procedures - will instruct when and how to resume after procedure. Benefits and risks of procedure explained including risks of bleeding, perforation, infection, missed lesions, reactions to medications and possible need for hospitalization and surgery for complications. Additional rare but real risk of stroke or other vascular clotting events off Eliquis was also explained and need to seek urgent help if any signs of these problems occur. Will communicate by phone or EMR with patient's  prescribing provider to confirm that holding Eliquis is reasonable in this case.   3.  IDA --continue oral iron.  She will follow-up with Dr. Lorenso Courier who will monitor her iron deficiency anemia which is likely resolved after colon cancer diagnosis and treatment.  Hold oral iron 1 week before colonoscopy  30 minutes total spent today including patient facing time, coordination of care, reviewing medical history/procedures/pertinent radiology studies, and documentation of the encounter.

## 2021-12-18 NOTE — Patient Instructions (Signed)
_______________________________________________________  If you are age 69 or older, your body mass index should be between 23-30. Your Body mass index is 26 kg/m. If this is out of the aforementioned range listed, please consider follow up with your Primary Care Provider.  If you are age 21 or younger, your body mass index should be between 19-25. Your Body mass index is 26 kg/m. If this is out of the aformentioned range listed, please consider follow up with your Primary Care Provider.   ________________________________________________________  The [facility] providers would like to encourage you to use [facility] to communicate with providers for non-urgent requests or questions.  Due to long hold times on the telephone, sending your provider a message by [facility] may be a faster and more efficient way to get a response.  Please allow 48 business hours for a response.  Please remember that this is for non-urgent requests.  _______________________________________________________  Alexandria French have been scheduled for a colonoscopy. Please follow written instructions given to you at your visit today.  Please pick up your prep supplies at the pharmacy within the next 1-3 days. If you use inhalers (even only as needed), please bring them with you on the day of your procedure.

## 2021-12-18 NOTE — Telephone Encounter (Signed)
[facility] Pre-operative Risk Assessment     Request for surgical clearance:     Endoscopy Procedure  What type of surgery is being performed?     Colonoscopy  When is this surgery scheduled?     01/03/2022  What type of clearance is required ?   Pharmacy  Are there any medications that need to be held prior to surgery and how long? Eliquis - 2 days  Practice name and name of physician performing surgery?      [facility]  What is your office phone and fax number?      Phone- [phone]  [phone]  Anesthesia type (None, local, MAC, general) ?       MAC

## 2021-12-18 NOTE — Progress Notes (Signed)
   Subjective:    [name], female    DOB: [birth year], 71 y.o.   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  She is here alone today.  She had a colonoscopy with Dr. Carlean Purl 1 year ago during a hospitalization which revealed cecal adenocarcinoma and had a right hemicolectomy done just a few days later in September 2023.  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  All 12 lymph nodes were negative and the margins were uninvolved.  She reports that she recovered quite well.  She has remained on oral iron.  Bowel movements are urgent but controllable.  Stools are rarely completely formed but are semiformed and loose.  No diarrhea.  Trace of red blood occasionally on toilet tissue only but not in stool which she attributes to hemorrhoids.  No abdominal pain.  No upper GI or hepatobiliary complaint.   Review of Systems As per HPI, otherwise negative  Current Medications, Allergies, Past Medical History, Past Surgical History, Family History and Social History were reviewed in [employer] record.     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   Stage I colon cancer status postresection -fortunately her tumor was in an early stage and was surgically resected.  Surveillance colonoscopy indicated at this time.  We will perform this in the outpatient hospital setting on October 5.  We discussed the risk, benefits and alternatives and she is agreeable and wishes to proceed. -- Colonoscopy at [facility] in early October  2.  Chronic anticoagulation in the setting of atrial fibrillation --hold Eliquis 2 days prior to colonoscopy.  Will hold Eliquis 2 days prior to endoscopic procedures - will instruct when and how to resume after procedure. Benefits and risks of procedure explained including risks of bleeding, perforation, infection, missed lesions, reactions to medications and possible need for hospitalization and surgery for complications. Additional rare but real risk of stroke or other vascular clotting events off Eliquis was also explained and need to seek urgent help if any signs of these problems occur. Will communicate by phone or EMR with patient's  prescribing provider to confirm that holding Eliquis is reasonable in this case.   3.  IDA --continue oral iron.  She will follow-up with Dr. Lorenso Courier who will monitor her iron deficiency anemia which is likely resolved after colon cancer diagnosis and treatment.  Hold oral iron 1 week before colonoscopy  30 minutes total spent today including patient facing time, coordination of care, reviewing medical history/procedures/pertinent radiology studies, and documentation of the encounter.

## 2021-12-19 NOTE — Telephone Encounter (Signed)
   Patient Name: Alexandria French  DOB: [birth year] MRN: [id number]  Primary Cardiologist: Sanda Klein, MD  Chart reviewed as part of pre-operative protocol coverage. Patient may proceed with colonoscopy as requested. Per pharmD, "Per office protocol, patient can hold Eliquis for 2 days prior to procedure. Patient will not need bridging with Lovenox (enoxaparin) around procedure."  Will route this bundled recommendation to requesting provider via Epic fax function. Please call with questions.  Charlie Pitter, PA-C 12/19/2021, 3:32 PM

## 2021-12-19 NOTE — Telephone Encounter (Signed)
Patient with diagnosis of atria fibrillation on Eliquis for anticoagulation.    Procedure: colonoscopy Date of procedure: 01/03/22   CHA2DS2-VASc Score = 4   This indicates a 4.8% annual risk of stroke. The patient's score is based upon: CHF History: 0 HTN History: 1 Diabetes History: 0 Stroke History: 0 Vascular Disease History: 1 Age Score: 1 Gender Score: 1   CrCl 64 Platelet count 246  Per office protocol, patient can hold Eliquis for 2 days prior to procedure.    Patientwill not need bridging with Lovenox (enoxaparin) around procedure.  **This guidance is not considered finalized until pre-operative APP has relayed final recommendations.**

## 2021-12-19 NOTE — Telephone Encounter (Signed)
Will route to pharm for anticoag. Felt to be overall stable per 11/05/21 OV. Pharm clearance only, no specific contraindication to proceeding identified otherwise.

## 2021-12-20 ENCOUNTER — Telehealth: Payer: Self-pay

## 2021-12-20 DIAGNOSIS — M8589 Other specified disorders of bone density and structure, multiple sites: Secondary | ICD-10-CM | POA: Diagnosis not present

## 2021-12-20 DIAGNOSIS — M810 Age-related osteoporosis without current pathological fracture: Secondary | ICD-10-CM | POA: Diagnosis not present

## 2021-12-20 NOTE — Telephone Encounter (Signed)
Spoke with patient and told her that per her Cardiologist, she could hold her Eliquis for 2 days prior to her procedure.  Patient agreed.

## 2021-12-27 NOTE — Progress Notes (Signed)
Remote pacemaker transmission.   

## 2021-12-28 ENCOUNTER — Encounter (HOSPITAL_COMMUNITY): Payer: Self-pay | Admitting: Internal Medicine

## 2021-12-28 NOTE — Progress Notes (Signed)
[name] Bowel Prep reminder: follow prep given by office  For Anesthesia: PCP - Stana Bunting Cardiologist - Croitoru, Mihai  Chest x-ray - 12/2020 EKG - 11/15/21 Stress Test -  2015 ECHO - 12/15/2020 Cardiac Cath -  11/2020 Pacemaker/ICD device last checked: PM 12/03/21 Spinal Cord Stimulator: n/a Sleep Study - n/a CPAP -  n/a   Blood Thinner Instructions:Eliquis Instructions:  hold 2 days prior Last Dose:      Anesthesia review: Hx of afib, CAD, HTN, SSS, PM 2021, colon cancer resected 2022. Cards clearance 12/19/21, pt states BP goes up and down and sometimes is normal but during one colonoscopy it was low and they did not finish procedure.

## 2022-01-02 ENCOUNTER — Telehealth: Payer: Self-pay | Admitting: Internal Medicine

## 2022-01-02 NOTE — Telephone Encounter (Signed)
Inbound call from patients husband requesting a call back to discuss prep. Patient is scheduled for procedure at hospital tomorrow morning.  Please advise.

## 2022-01-02 NOTE — Telephone Encounter (Signed)
Left message for pt to call back.  Pts husband states they live an hour away and wanted to know if they could move up the time of the start of 2nd dose of prep to be able to get here on time for procedure. Discussed with pts husband that she could do the second prep an hour earlier but to make sure she drinks the required amount of water. He verbalized understanding.

## 2022-01-03 ENCOUNTER — Encounter (HOSPITAL_COMMUNITY): Payer: Self-pay | Admitting: Internal Medicine

## 2022-01-03 ENCOUNTER — Encounter (HOSPITAL_COMMUNITY)
Admission: RE | Disposition: A | Discharge status: Home / Self Care | Payer: Self-pay | Source: Home / Self Care | Attending: Internal Medicine

## 2022-01-03 ENCOUNTER — Other Ambulatory Visit: Payer: Self-pay

## 2022-01-03 ENCOUNTER — Ambulatory Visit (HOSPITAL_COMMUNITY): Payer: Medicare PPO | Admitting: Physician Assistant

## 2022-01-03 ENCOUNTER — Ambulatory Visit (HOSPITAL_COMMUNITY)
Admission: RE | Admit: 2022-01-03 | Discharge: 2022-01-03 | Disposition: A | Discharge status: Home / Self Care | Payer: Medicare PPO | Attending: Internal Medicine | Admitting: Internal Medicine

## 2022-01-03 ENCOUNTER — Ambulatory Visit (HOSPITAL_BASED_OUTPATIENT_CLINIC_OR_DEPARTMENT_OTHER): Payer: Medicare PPO | Admitting: Physician Assistant

## 2022-01-03 DIAGNOSIS — K219 Gastro-esophageal reflux disease without esophagitis: Secondary | ICD-10-CM | POA: Insufficient documentation

## 2022-01-03 DIAGNOSIS — I480 Paroxysmal atrial fibrillation: Secondary | ICD-10-CM | POA: Insufficient documentation

## 2022-01-03 DIAGNOSIS — I447 Left bundle-branch block, unspecified: Secondary | ICD-10-CM | POA: Insufficient documentation

## 2022-01-03 DIAGNOSIS — D125 Benign neoplasm of sigmoid colon: Secondary | ICD-10-CM | POA: Diagnosis not present

## 2022-01-03 DIAGNOSIS — D509 Iron deficiency anemia, unspecified: Secondary | ICD-10-CM | POA: Diagnosis not present

## 2022-01-03 DIAGNOSIS — Z980 Intestinal bypass and anastomosis status: Secondary | ICD-10-CM | POA: Diagnosis not present

## 2022-01-03 DIAGNOSIS — I10 Essential (primary) hypertension: Secondary | ICD-10-CM

## 2022-01-03 DIAGNOSIS — Z08 Encounter for follow-up examination after completed treatment for malignant neoplasm: Secondary | ICD-10-CM

## 2022-01-03 DIAGNOSIS — Z1211 Encounter for screening for malignant neoplasm of colon: Secondary | ICD-10-CM | POA: Insufficient documentation

## 2022-01-03 DIAGNOSIS — K5730 Diverticulosis of large intestine without perforation or abscess without bleeding: Secondary | ICD-10-CM | POA: Insufficient documentation

## 2022-01-03 DIAGNOSIS — K635 Polyp of colon: Secondary | ICD-10-CM | POA: Diagnosis not present

## 2022-01-03 DIAGNOSIS — K648 Other hemorrhoids: Secondary | ICD-10-CM | POA: Diagnosis not present

## 2022-01-03 DIAGNOSIS — Z950 Presence of cardiac pacemaker: Secondary | ICD-10-CM | POA: Insufficient documentation

## 2022-01-03 DIAGNOSIS — Z85038 Personal history of other malignant neoplasm of large intestine: Secondary | ICD-10-CM

## 2022-01-03 DIAGNOSIS — K639 Disease of intestine, unspecified: Secondary | ICD-10-CM

## 2022-01-03 DIAGNOSIS — I252 Old myocardial infarction: Secondary | ICD-10-CM | POA: Diagnosis not present

## 2022-01-03 DIAGNOSIS — I2510 Atherosclerotic heart disease of native coronary artery without angina pectoris: Secondary | ICD-10-CM | POA: Diagnosis not present

## 2022-01-03 DIAGNOSIS — Z7901 Long term (current) use of anticoagulants: Secondary | ICD-10-CM | POA: Insufficient documentation

## 2022-01-03 HISTORY — DX: Other complications of anesthesia, initial encounter: T88.59XA

## 2022-01-03 HISTORY — PX: POLYPECTOMY: SHX5525

## 2022-01-03 HISTORY — PX: COLONOSCOPY WITH PROPOFOL: SHX5780

## 2022-01-03 HISTORY — PX: BIOPSY: SHX5522

## 2022-01-03 HISTORY — PX: HEMOSTASIS CLIP PLACEMENT: SHX6857

## 2022-01-03 SURGERY — COLONOSCOPY WITH PROPOFOL
Anesthesia: Monitor Anesthesia Care

## 2022-01-03 MED ORDER — PROPOFOL 10 MG/ML IV BOLUS
INTRAVENOUS | Status: DC | PRN
  Administered 2022-01-03: 20 mg via INTRAVENOUS
  Administered 2022-01-03: 10 mg via INTRAVENOUS
  Administered 2022-01-03: 20 mg via INTRAVENOUS
  Administered 2022-01-03: 10 mg via INTRAVENOUS

## 2022-01-03 MED ORDER — PROPOFOL 500 MG/50ML IV EMUL
INTRAVENOUS | Status: DC | PRN
  Administered 2022-01-03: 130 ug/kg/min via INTRAVENOUS

## 2022-01-03 MED ORDER — SODIUM CHLORIDE 0.9 % IV SOLN: INTRAVENOUS | Status: DC

## 2022-01-03 MED ORDER — LACTATED RINGERS IV SOLN: INTRAVENOUS | Status: DC | PRN

## 2022-01-03 MED ORDER — PROPOFOL 1000 MG/100ML IV EMUL
INTRAVENOUS | Status: AC
  Filled 2022-01-03: qty 100

## 2022-01-03 SURGICAL SUPPLY — 22 items

## 2022-01-03 NOTE — Anesthesia Postprocedure Evaluation (Signed)
Anesthesia Post Note  Patient: Alexandria French  Procedure(s) Performed: COLONOSCOPY WITH PROPOFOL BIOPSY POLYPECTOMY HEMOSTASIS CLIP PLACEMENT     Patient location during evaluation: Endoscopy Anesthesia Type: MAC Level of consciousness: awake Pain management: pain level controlled Respiratory status: spontaneous breathing Cardiovascular status: stable Postop Assessment: no apparent nausea or vomiting Anesthetic complications: no   No notable events documented.  Last Vitals:  Vitals:   01/03/22 1050 01/03/22 1100  BP: 114/64 103/63  Pulse:  (!) 59  Resp: 19 17  Temp:    SpO2: 100% 97%    Last Pain:  Vitals:   01/03/22 1100  TempSrc:   PainSc: 0-No pain                 Jimeka Balan

## 2022-01-03 NOTE — Discharge Instructions (Signed)
YOU HAD AN ENDOSCOPIC PROCEDURE TODAY: Refer to the procedure report and other information in the discharge instructions given to you for any specific questions about what was found during the examination. If this information does not answer your questions, please call [place] office at [phone] to clarify.  ° °YOU SHOULD EXPECT: Some feelings of bloating in the abdomen. Passage of more gas than usual. Walking can help get rid of the air that was put into your GI tract during the procedure and reduce the bloating. If you had a lower endoscopy (such as a colonoscopy or flexible sigmoidoscopy) you may notice spotting of blood in your stool or on the toilet paper. Some abdominal soreness may be present for a day or two, also. ° °DIET: Your first meal following the procedure should be a light meal and then it is ok to progress to your normal diet. A half-sandwich or bowl of soup is an example of a good first meal. Heavy or fried foods are harder to digest and may make you feel nauseous or bloated. Drink plenty of fluids but you should avoid alcoholic beverages for 24 hours. If you had a esophageal dilation, please see attached instructions for diet.   ° °ACTIVITY: Your care partner should take you home directly after the procedure. You should plan to take it easy, moving slowly for the rest of the day. You can resume normal activity the day after the procedure however YOU SHOULD NOT DRIVE, use power tools, machinery or perform tasks that involve climbing or major physical exertion for 24 hours (because of the sedation medicines used during the test).  ° °SYMPTOMS TO REPORT IMMEDIATELY: °A gastroenterologist can be reached at any hour. Please call [phone]  for any of the following symptoms:  °Following lower endoscopy (colonoscopy, flexible sigmoidoscopy) °Excessive amounts of blood in the stool  °Significant tenderness, worsening of abdominal pains  °Swelling of the abdomen that is new, acute  °Fever of 100° or  higher  °Following upper endoscopy (EGD, EUS, ERCP, esophageal dilation) °Vomiting of blood or coffee ground material  °New, significant abdominal pain  °New, significant chest pain or pain under the shoulder blades  °Painful or persistently difficult swallowing  °New shortness of breath  °Black, tarry-looking or red, bloody stools ° °FOLLOW UP:  °If any biopsies were taken you will be contacted by phone or by letter within the next 1-3 weeks. Call [phone]  if you have not heard about the biopsies in 3 weeks.  °Please also call with any specific questions about appointments or follow up tests. ° °

## 2022-01-03 NOTE — Interval H&P Note (Signed)
History and Physical Interval Note: Patient presenting for surveillance colonoscopy today 1 year after cecal cancer removed surgically. She tolerated the prep. She has been off Eliquis x48 hours   01/03/2022 9:53 AM  Alexandria French  has presented today for surgery, with the diagnosis of hx of colon cancer.  The various methods of treatment have been discussed with the patient and family. After consideration of risks, benefits and other options for treatment, the patient has consented to  Procedure(s): COLONOSCOPY WITH PROPOFOL (N/A) as a surgical intervention.  The patient's history has been reviewed, patient examined, no change in status, stable for surgery.  I have reviewed the patient's chart and labs.  Questions were answered to the patient's satisfaction.     Lajuan Lines Jackqueline Aquilar

## 2022-01-03 NOTE — Transfer of Care (Signed)
Immediate Anesthesia Transfer of Care Note  Patient: Alexandria French  Procedure(s) Performed: COLONOSCOPY WITH PROPOFOL BIOPSY POLYPECTOMY HEMOSTASIS CLIP PLACEMENT  Patient Location: PACU  Anesthesia Type:MAC  Level of Consciousness: sedated  Airway & Oxygen Therapy: Patient Spontanous Breathing and Patient connected to face mask oxygen  Post-op Assessment: Report given to RN and Post -op Vital signs reviewed and stable  Post vital signs: Reviewed and stable  Last Vitals:  Vitals Value Taken Time  BP    Temp    Pulse    Resp    SpO2      Last Pain:  Vitals:   01/03/22 0941  TempSrc: Oral  PainSc: 0-No pain         Complications: No notable events documented.

## 2022-01-03 NOTE — Anesthesia Preprocedure Evaluation (Signed)
Anesthesia Evaluation  Patient identified by MRN, date of birth, ID band Patient awake    Reviewed: Allergy & Precautions, NPO status , Patient's Chart, lab work & pertinent test results  Airway Mallampati: II       Dental   Pulmonary neg pulmonary ROS,    breath sounds clear to auscultation       Cardiovascular hypertension, + CAD and + Past MI  + dysrhythmias + pacemaker  Rhythm:Regular Rate:Normal     Neuro/Psych negative neurological ROS     GI/Hepatic Neg liver ROS, GERD  ,  Endo/Other  negative endocrine ROS  Renal/GU negative Renal ROS     Musculoskeletal   Abdominal   Peds  Hematology   Anesthesia Other Findings   Reproductive/Obstetrics                             Anesthesia Physical Anesthesia Plan  ASA: 3  Anesthesia Plan: MAC   Post-op Pain Management:    Induction: Intravenous  PONV Risk Score and Plan: Ondansetron, Dexamethasone and Treatment may vary due to age or medical condition  Airway Management Planned: Nasal Cannula and Simple Face Mask  Additional Equipment:   Intra-op Plan:   Post-operative Plan:   Informed Consent: I have reviewed the patients History and Physical, chart, labs and discussed the procedure including the risks, benefits and alternatives for the proposed anesthesia with the patient or authorized representative who has indicated his/her understanding and acceptance.     Dental advisory given  Plan Discussed with: CRNA and Anesthesiologist  Anesthesia Plan Comments:         Anesthesia Quick Evaluation

## 2022-01-03 NOTE — Op Note (Signed)
[facility] Patient Name: Alexandria French Procedure Date: 01/03/2022 MRN: [id number] Attending MD: Jerene Bears , MD Date of Birth: 1950/11/04 CSN: [id number] Age: 71 Admit Type: Outpatient Procedure:                Colonoscopy Indications:              High risk colon cancer surveillance: Personal                            history of colon cancer Stage I, Last colonoscopy:                            September 2022; Status post right hemicolectomy Providers:                Lajuan Lines. Hilarie Fredrickson, MD, Benay Pillow, RN, Darliss Cheney,                            Technician Referring MD:             Drema Dallas. Hassell Done Iv Medicines:                Monitored Anesthesia Care Complications:            No immediate complications. Estimated Blood Loss:     Estimated blood loss: none. Procedure:                Pre-Anesthesia Assessment:                           - Prior to the procedure, a History and Physical                            was performed, and patient medications and                            allergies were reviewed. The patient's tolerance of                            previous anesthesia was also reviewed. The risks                            and benefits of the procedure and the sedation                            options and risks were discussed with the patient.                            All questions were answered, and informed consent                            was obtained. Prior Anticoagulants: The patient has                            taken Eliquis (apixaban), last dose was 2 days  prior to procedure. ASA Grade Assessment: III - A                            patient with severe systemic disease. After                            reviewing the risks and benefits, the patient was                            deemed in satisfactory condition to undergo the                            procedure.                           After  obtaining informed consent, the colonoscope                            was passed under direct vision. Throughout the                            procedure, the patient's blood pressure, pulse, and                            oxygen saturations were monitored continuously. The                            [facility] [id number] was                            introduced through the anus and advanced to the                            ileocolonic anastomosis. The colonoscopy was                            performed without difficulty. The patient tolerated                            the procedure well. The quality of the bowel                            preparation was excellent. Scope In: 10:15:14 AM Scope Out: 10:36:39 AM Scope Withdrawal Time: 0 hours 16 minutes 44 seconds  Total Procedure Duration: 0 hours 21 minutes 25 seconds  Findings:      The digital rectal exam was normal.      There was evidence of a prior end-to-side ileo-colonic anastomosis in       the ascending colon. This was patent and was in one area characterized       by nodular/polypoid mucosa. This area was biopsied with a cold forceps       for histology to exclude residual neoplasia.      A 14 mm polyp was found in the distal sigmoid colon. The polyp was       pedunculated. The  polyp was removed with a hot snare. Resection and       retrieval were complete. To prevent bleeding after the polypectomy, one       hemostatic clip was successfully placed (MR conditional). There was no       bleeding during, or at the end, of the procedure.      Two sessile polyps were found in the sigmoid colon. The polyps were 4 to       6 mm in size. These polyps were removed with a cold snare. Resection and       retrieval were complete.      Multiple small and large-mouthed diverticula were found from ascending       colon to sigmoid colon.      Internal hemorrhoids and mucosal prolapse change were found during        retroflexion. The hemorrhoids were medium-sized. Impression:               - Patent end-to-side ileo-colonic anastomosis,                            characterized by area of polypoid mucosa. Biopsied.                           - One 14 mm polyp in the distal sigmoid colon,                            removed with a hot snare. Resected and retrieved.                            Clip (MR conditional) was placed.                           - Two 4 to 6 mm polyps in the sigmoid colon,                            removed with a cold snare. Resected and retrieved.                           - Severe diverticulosis from ascending colon to                            sigmoid colon.                           - Internal hemorrhoids and mucosal prolapse change. Moderate Sedation:      N/A Recommendation:           - Patient has a contact number available for                            emergencies. The signs and symptoms of potential                            delayed complications were discussed with the                            patient.  Return to normal activities tomorrow.                            Written discharge instructions were provided to the                            patient.                           - Resume previous diet.                           - Continue present medications.                           - Resume Eliquis (apixaban) at prior dose tomorrow.                            Refer to managing physician for further adjustment                            of therapy.                           - Await pathology results.                           - Repeat colonoscopy is recommended for                            surveillance. The colonoscopy date will be                            determined after pathology results from today's                            exam become available for review. Procedure Code(s):        --- Professional ---                           [phone], Colonoscopy,  flexible; with removal of                            tumor(s), polyp(s), or other lesion(s) by snare                            technique                           45380, 27, Colonoscopy, flexible; with biopsy,                            single or multiple Diagnosis Code(s):        --- Professional ---                           K63.5, Polyp of colon  Z85.038, Personal history of other malignant                            neoplasm of large intestine                           Z98.0, Intestinal bypass and anastomosis status                           K64.8, Other hemorrhoids                           K57.30, Diverticulosis of large intestine without                            perforation or abscess without bleeding CPT copyright 2019 American Medical Association. All rights reserved. The codes documented in this report are preliminary and upon coder review may  be revised to meet current compliance requirements. Jerene Bears, MD 01/03/2022 10:46:31 AM This report has been signed electronically. Number of Addenda: 0

## 2022-01-04 ENCOUNTER — Encounter: Payer: Self-pay | Admitting: Internal Medicine

## 2022-01-04 LAB — SURGICAL PATHOLOGY

## 2022-01-07 ENCOUNTER — Inpatient Hospital Stay: Payer: Medicare PPO | Admitting: Hematology and Oncology

## 2022-01-07 ENCOUNTER — Encounter (HOSPITAL_COMMUNITY): Payer: Self-pay | Admitting: Internal Medicine

## 2022-01-07 ENCOUNTER — Other Ambulatory Visit: Payer: Self-pay

## 2022-01-07 ENCOUNTER — Other Ambulatory Visit: Payer: Self-pay | Admitting: Hematology and Oncology

## 2022-01-07 ENCOUNTER — Inpatient Hospital Stay: Payer: Medicare PPO | Attending: Hematology and Oncology

## 2022-01-07 VITALS — BP 153/74 | HR 89 | Resp 17 | Wt 142.1 lb

## 2022-01-07 DIAGNOSIS — Z85038 Personal history of other malignant neoplasm of large intestine: Secondary | ICD-10-CM | POA: Diagnosis not present

## 2022-01-07 DIAGNOSIS — D500 Iron deficiency anemia secondary to blood loss (chronic): Secondary | ICD-10-CM | POA: Diagnosis not present

## 2022-01-07 DIAGNOSIS — C187 Malignant neoplasm of sigmoid colon: Secondary | ICD-10-CM

## 2022-01-07 DIAGNOSIS — Z862 Personal history of diseases of the blood and blood-forming organs and certain disorders involving the immune mechanism: Secondary | ICD-10-CM | POA: Diagnosis not present

## 2022-01-07 LAB — CMP (CANCER CENTER ONLY)
ALT: 22 U/L (ref 0–44)
AST: 25 U/L (ref 15–41)
Albumin: 4.5 g/dL (ref 3.5–5.0)
Alkaline Phosphatase: 60 U/L (ref 38–126)
Anion gap: 4 — ABNORMAL LOW (ref 5–15)
BUN: 12 mg/dL (ref 8–23)
CO2: 33 mmol/L — ABNORMAL HIGH (ref 22–32)
Calcium: 10.1 mg/dL (ref 8.9–10.3)
Chloride: 105 mmol/L (ref 98–111)
Creatinine: 0.89 mg/dL (ref 0.44–1.00)
GFR, Estimated: >60 mL/min (ref >60)
Glucose, Bld: 93 mg/dL (ref 70–99)
Potassium: 3.7 mmol/L (ref 3.5–5.1)
Sodium: 142 mmol/L (ref 135–145)
Total Bilirubin: 0.7 mg/dL (ref 0.3–1.2)
Total Protein: 6.9 g/dL (ref 6.5–8.1)

## 2022-01-07 LAB — CBC WITH DIFFERENTIAL (CANCER CENTER ONLY)
Abs Immature Granulocytes: 0.01 10*3/uL (ref 0.00–0.07)
Basophils Absolute: 0 10*3/uL (ref 0.0–0.1)
Basophils Relative: 1 %
Eosinophils Absolute: 0.2 10*3/uL (ref 0.0–0.5)
Eosinophils Relative: 3 %
HCT: 39.2 % (ref 36.0–46.0)
Hemoglobin: 13.3 g/dL (ref 12.0–15.0)
Immature Granulocytes: 0 %
Lymphocytes Relative: 22 %
Lymphs Abs: 1.2 10*3/uL (ref 0.7–4.0)
MCH: 31.4 pg (ref 26.0–34.0)
MCHC: 33.9 g/dL (ref 30.0–36.0)
MCV: 92.5 fL (ref 80.0–100.0)
Monocytes Absolute: 0.4 10*3/uL (ref 0.1–1.0)
Monocytes Relative: 8 %
Neutro Abs: 3.5 10*3/uL (ref 1.7–7.7)
Neutrophils Relative %: 66 %
Platelet Count: 196 10*3/uL (ref 150–400)
RBC: 4.24 MIL/uL (ref 3.87–5.11)
RDW: 12.4 % (ref 11.5–15.5)
WBC Count: 5.3 10*3/uL (ref 4.0–10.5)
nRBC: 0 % (ref 0.0–0.2)

## 2022-01-07 LAB — IRON AND IRON BINDING CAPACITY (CC-WL,HP ONLY)
Iron: 102 ug/dL (ref 28–170)
Saturation Ratios: 29 % (ref 10.4–31.8)
TIBC: 351 ug/dL (ref 250–450)
UIBC: 249 ug/dL (ref 148–442)

## 2022-01-07 LAB — RETIC PANEL
Immature Retic Fract: 11 % (ref 2.3–15.9)
RBC.: 4.2 MIL/uL (ref 3.87–5.11)
Retic Count, Absolute: 68.9 10*3/uL (ref 19.0–186.0)
Retic Ct Pct: 1.6 % (ref 0.4–3.1)
Reticulocyte Hemoglobin: 34.6 pg (ref >27.9)

## 2022-01-07 LAB — FERRITIN: Ferritin: 248 ng/mL (ref 11–307)

## 2022-01-07 NOTE — Progress Notes (Signed)
[place] [phone]   [phone]  PROGRESS NOTE  Patient Care Team: [place] Nation, MD as PCP - General (Internal Medicine) Croitoru, Dani Gobble, MD as PCP - Cardiology (Cardiology)  Hematological/Oncological History # Adenocarcinoma of the Colon, pT2, N0, M0-Stage I. S/p Resection 12/20/2020: colonoscopy finds a sessile, ulcerated non-obstructing mass in the cecum. Biopsy performed, consistent with adenocarcinoma of the colon.  12/22/2020: laparoscopic assistance partial colectomy performed. Pathology shows a pT2N0 adenocarcinoma of the colon, consistent with Stage I.  01/08/2021: Establish care with Dr. Lorenso Courier  Interval History:  Alexandria French 71 y.o. female with medical history significant for early stage adenocarcinoma of the colon and iron deficiency anemia who presents for a follow up visit. The patient's last visit was on 01/08/21. In the interim since the last visit she underwent a colonoscopy last week with no evidence of recurrent/residual disease.  On exam today Alexandria French reports that she feels quite well.  She reports that she has been taking iron pills in the interim since her last visit.  She had stopped these recently before the colonoscopy.  She reports that there were 3 polyps found during her colonoscopy.  She notes that she is not having any overt signs of bleeding, bruising, or dark stools.  She reports that she is eating quite well and her weight has been stable.  She is currently 142 pounds.  She does that her energy levels are "pretty good".  Overall she is at her baseline level of health and feels quite well.  She has no questions concerns or complaints today.  She otherwise denies any fevers, chills, sweats, nausea, vomiting or diarrhea.  Full 10 point ROS is listed below.  MEDICAL HISTORY:  Past Medical History:  Diagnosis Date   A-fib [facility])    CAD (coronary artery disease)    Colon adenocarcinoma ([place])    resected no other  treatment   Complication of anesthesia    sometimes BP flucuates   Diverticulosis    Esophageal reflux    no meds   External hemorrhoids    Gastritis    Hypertension    IDA (iron deficiency anemia)    Internal hemorrhoids    Other and unspecified hyperlipidemia    Presence of permanent cardiac pacemaker 2021   bost sci   SSS (sick sinus syndrome) ([place])     SURGICAL HISTORY: Past Surgical History:  Procedure Laterality Date   ABDOMINAL HYSTERECTOMY  08/2008   BIOPSY  12/20/2020   Procedure: BIOPSY;  Surgeon: Gatha Mayer, MD;  Location: [place];  Service: Endoscopy;;   BIOPSY  01/03/2022   Procedure: BIOPSY;  Surgeon: Jerene Bears, MD;  Location: [facility];  Service: Gastroenterology;;   BLADDER REPAIR     COLONOSCOPY WITH PROPOFOL N/A 12/20/2020   Procedure: COLONOSCOPY WITH PROPOFOL;  Surgeon: Gatha Mayer, MD;  Location: [facility] ENDOSCOPY;  Service: Endoscopy;  Laterality: N/A;   COLONOSCOPY WITH PROPOFOL N/A 01/03/2022   Procedure: COLONOSCOPY WITH PROPOFOL;  Surgeon: Jerene Bears, MD;  Location: [facility];  Service: Gastroenterology;  Laterality: N/A;   HEMOSTASIS CLIP PLACEMENT  01/03/2022   Procedure: HEMOSTASIS CLIP PLACEMENT;  Surgeon: Jerene Bears, MD;  Location: [facility];  Service: Gastroenterology;;   LAPAROSCOPIC PARTIAL COLECTOMY N/A 12/22/2020   Procedure: LAPAROSCOPIC ASSISTED PARTIAL COLECTOMY;  Surgeon: Coralie Keens, MD;  Location: [place];  Service: General;  Laterality: N/A;   PACEMAKER IMPLANT N/A 02/28/2020   Procedure: PACEMAKER IMPLANT;  Surgeon: Sanda Klein, MD;  Location: [place] CV LAB;  Service: Cardiovascular;  Laterality: N/A;   POLYPECTOMY  01/03/2022   Procedure: POLYPECTOMY;  Surgeon: Jerene Bears, MD;  Location: [facility];  Service: Gastroenterology;;   RIGHT/LEFT HEART CATH AND CORONARY ANGIOGRAPHY N/A 12/15/2020   Procedure: RIGHT/LEFT HEART CATH AND CORONARY ANGIOGRAPHY;  Surgeon: Leonie Man, MD;  Location: [place] CV LAB;  Service: Cardiovascular;  Laterality: N/A;   TONSILLECTOMY      SOCIAL HISTORY: Social History   Socioeconomic History   Marital status: Married    Spouse name: Not on file   Number of children: Not on file   Years of education: Not on file   Highest education level: Not on file  Occupational History   Not on file  Tobacco Use   Smoking status: Never   Smokeless tobacco: Never  Vaping Use   Vaping Use: Never used  Substance and Sexual Activity   Alcohol use: No    Alcohol/week: 0.0 standard drinks of alcohol   Drug use: No   Sexual activity: Not on file  Other Topics Concern   Not on file  Social History Narrative   Not on file   Social Determinants of Health   Financial Resource Strain: Not on file  Food Insecurity: Not on file  Transportation Needs: Not on file  Physical Activity: Not on file  Stress: Not on file  Social Connections: Not on file  Intimate Partner Violence: Not on file    FAMILY HISTORY: Family History  Problem Relation Age of Onset   Heart disease Other    Osteoporosis Other    Arthritis Other    Colon cancer Neg Hx    Colon polyps Neg Hx     ALLERGIES:  is allergic to cefzil [cefprozil].  MEDICATIONS:  Current Outpatient Medications  Medication Sig Dispense Refill   acetaminophen (TYLENOL) 500 MG tablet Take 2 tablets (1,000 mg total) by mouth every 6 (six) hours as needed for fever or mild pain. (Patient taking differently: Take 500 mg by mouth every 6 (six) hours as needed for fever or mild pain.) 30 tablet 0   bisoprolol (ZEBETA) 10 MG tablet Take 10 mg (one tablet) in the morning and 5 mg (half a tablet) in the evening. 45 tablet 5   Calcium Carbonate-Vitamin D 600-400 MG-UNIT tablet Take 1 tablet by mouth 2 (two) times daily.      cetirizine (ZYRTEC) 10 MG tablet Take 10 mg by mouth every evening.      Coenzyme Q10 200 MG capsule Take 200 mg by mouth daily.     diltiazem (CARDIZEM) 30 MG tablet Take 1 tablet (30  mg total) by mouth 3 (three) times daily as needed (palpitations). (Patient not taking: Reported on 12/27/2021) 30 tablet 3   diphenhydrAMINE (BENADRYL) 25 MG tablet Take 12.5 mg by mouth at bedtime.     ELIQUIS 5 MG TABS tablet TAKE 1 TABLET BY MOUTH TWICE A DAY 180 tablet 1   ferrous sulfate 325 (65 FE) MG EC tablet Take 325 mg by mouth at bedtime.     meclizine (ANTIVERT) 25 MG tablet Take 25 mg by mouth 3 (three) times daily as needed. (Patient not taking: Reported on 12/27/2021)     melatonin 5 MG TABS Take 2.5 mg by mouth at bedtime.     simvastatin (ZOCOR) 20 MG tablet Take 20 mg by mouth daily.     vitamin C (ASCORBIC ACID) 500 MG tablet Take 500 mg by mouth daily.  No current facility-administered medications for this visit.    REVIEW OF SYSTEMS:   Constitutional: ( - ) fevers, ( - )  chills , ( - ) night sweats Eyes: ( - ) blurriness of vision, ( - ) double vision, ( - ) watery eyes Ears, nose, mouth, throat, and face: ( - ) mucositis, ( - ) sore throat Respiratory: ( - ) cough, ( - ) dyspnea, ( - ) wheezes Cardiovascular: ( - ) palpitation, ( - ) chest discomfort, ( - ) lower extremity swelling Gastrointestinal:  ( - ) nausea, ( - ) heartburn, ( - ) change in bowel habits Skin: ( - ) abnormal skin rashes Lymphatics: ( - ) new lymphadenopathy, ( - ) easy bruising Neurological: ( - ) numbness, ( - ) tingling, ( - ) new weaknesses Behavioral/Psych: ( - ) mood change, ( - ) new changes  All other systems were reviewed with the patient and are negative.  PHYSICAL EXAMINATION: ECOG PERFORMANCE STATUS: 0 - Asymptomatic  Vitals:   01/07/22 1047  BP: (!) 153/74  Pulse: 89  Resp: 17  SpO2: 99%   Filed Weights   01/07/22 1047  Weight: 142 lb 2 oz (64.5 kg)    GENERAL: Well-appearing elderly Caucasian female, alert, no distress and comfortable SKIN: skin color, texture, turgor are normal, no rashes or significant lesions EYES: conjunctiva are pink and non-injected, sclera  clear LUNGS: clear to auscultation and percussion with normal breathing effort HEART: regular rate & rhythm and no murmurs and no lower extremity edema Musculoskeletal: no cyanosis of digits and no clubbing  PSYCH: alert & oriented x 3, fluent speech NEURO: no focal motor/sensory deficits  LABORATORY DATA:  I have reviewed the data as listed    Latest Ref Rng & Units 01/07/2022    9:52 AM 01/08/2021    2:37 PM 12/24/2020    2:17 AM  CBC  WBC 4.0 - 10.5 K/uL 5.3  4.6  9.2   Hemoglobin 12.0 - 15.0 g/dL 13.3  10.7  8.2   Hematocrit 36.0 - 46.0 % 39.2  34.2  26.6   Platelets 150 - 400 K/uL 196  246  263        Latest Ref Rng & Units 01/07/2022    9:52 AM 01/08/2021    2:37 PM 12/23/2020    2:15 AM  CMP  Glucose 70 - 99 mg/dL 93  91  128   BUN 8 - 23 mg/dL '12  9  8   '$ Creatinine 0.44 - 1.00 mg/dL 0.89  0.89  0.86   Sodium 135 - 145 mmol/L 142  143  135   Potassium 3.5 - 5.1 mmol/L 3.7  3.8  4.5   Chloride 98 - 111 mmol/L 105  107  102   CO2 22 - 32 mmol/L 33  26  26   Calcium 8.9 - 10.3 mg/dL 10.1  10.9  9.5   Total Protein 6.5 - 8.1 g/dL 6.9  7.2    Total Bilirubin 0.3 - 1.2 mg/dL 0.7  0.5    Alkaline Phos 38 - 126 U/L 60  69    AST 15 - 41 U/L 25  28    ALT 0 - 44 U/L 22  18       RADIOGRAPHIC STUDIES: No results found.  ASSESSMENT & PLAN Alexandria French 71 y.o. female with medical history significant for early stage adenocarcinoma of the colon and iron deficiency anemia who presents for a follow up visit.  After review of the labs, review of the records, and discussion with the patient the patients findings are most consistent with stage I adenocarcinoma of the colon status post resection.  At this time we will follow the NCCN guidelines which recommend a colonoscopy to be repeated in 1 years time.  There is no clear indication for routine follow-up in oncology clinic with labs or imaging.  Her iron deficiency has resolved.  Therefore I do not think there is any need for  further routine visit in our clinic.  We are happy to see her back in the event that she would develop any new or worsening symptoms.  # Adenocarcinoma of the Colon, pT2, N0, M0-Stage I. S/p Resection --No evidence of metastatic disease on imaging of the chest or abdomen.  Pathology confirmed a PT2N0 disease which is consistent with stage I -- Patient completed the 1 year follow-up colonoscopy with no evidence of residual or recurrent disease. -- Continue with routine colonoscopies per recommendations of gastroenterology. --Return to clinic with oncology on an as-needed basis moving forward.   # Iron deficiency anemia secondary to colon cancer-resolved -- Labs today show white blood cell count 5.3, hemoglobin 13.3, MCV 92.5, and platelets of 196.  Iron levels have normalized with ferritin of 248  No orders of the defined types were placed in this encounter.   All questions were answered. The patient knows to call the clinic with any problems, questions or concerns.  A total of more than 30 minutes were spent on this encounter with face-to-face time and non-face-to-face time, including preparing to see the patient, ordering tests and/or medications, counseling the patient and coordination of care as outlined above.   Ledell Peoples, MD Department of Hematology/Oncology [place] at [facility] Phone: [phone] Pager: [phone] Email: [email or web address]  01/08/2022 3:36 PM

## 2022-02-08 ENCOUNTER — Other Ambulatory Visit: Payer: Self-pay | Admitting: Family

## 2022-02-08 DIAGNOSIS — Z7901 Long term (current) use of anticoagulants: Secondary | ICD-10-CM

## 2022-02-08 NOTE — Telephone Encounter (Signed)
Please review for refill. Thank you! 

## 2022-02-08 NOTE — Telephone Encounter (Signed)
Eliquis '5mg'$  refill request received. Patient is 71 years old, weight-64.5kg, Crea-0.89 on 01/07/2022, Diagnosis-Afib, and last seen by Dr. Sallyanne Kuster on 11/05/2021. Dose is appropriate based on dosing criteria. Will send in refill to requested pharmacy.

## 2022-02-11 IMAGING — CT CT CHEST W/O CM
2 of 4 series · 15 of 36 positions shown, 18 images · non-contrast
Comparison: December 15, 2020.

CLINICAL DATA: Acute hypoxic respiratory failure secondary to
decompensated diastolic heart failure.

EXAM:
CT CHEST WITHOUT CONTRAST
TECHNIQUE: Multidetector CT imaging of the chest was performed following the
standard protocol without IV contrast.

[Series 3: chest w/o 2mm st · axial · non-contrast · 0.64mm/px · z∈[+1094,+1382]mm · 12 of 168 slices shown, 15 images]
[im 12/168  mediastinal]
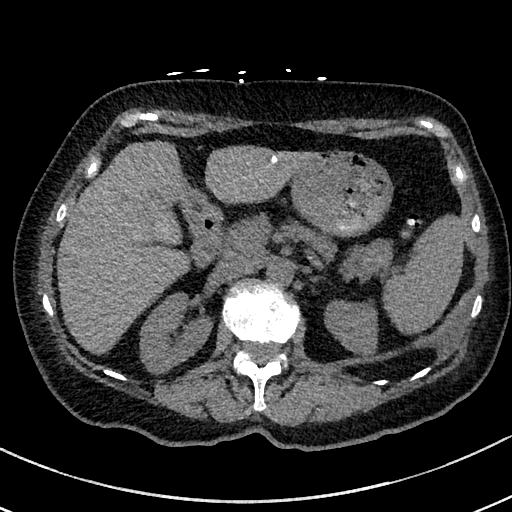
[im 12/168  lung]
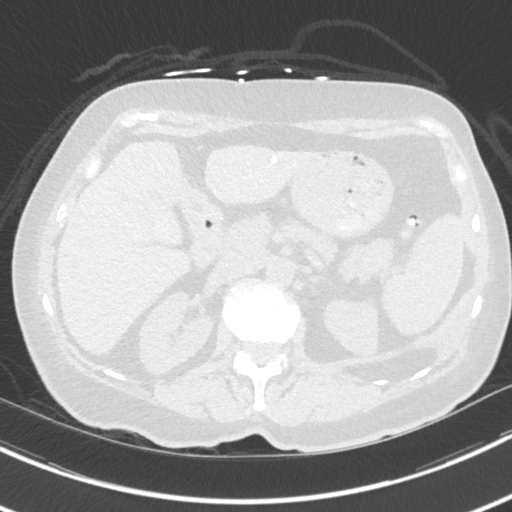
[im 24/168  lung]
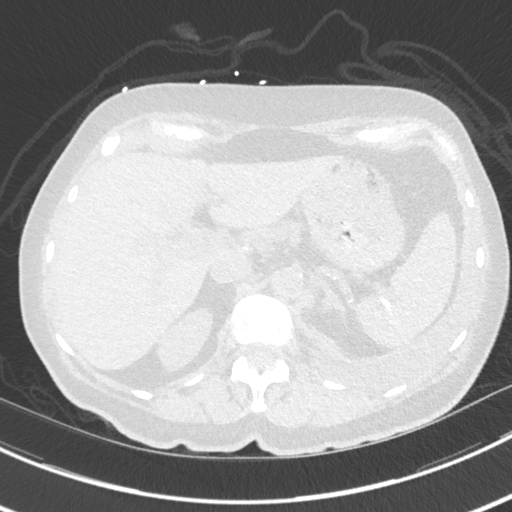
[im 36/168  lung]
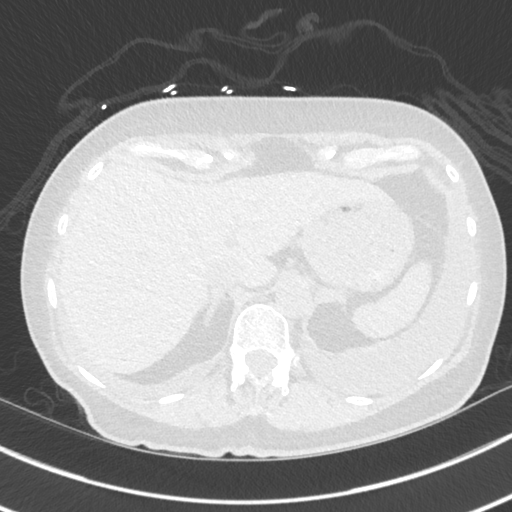
[im 48/168  lung]
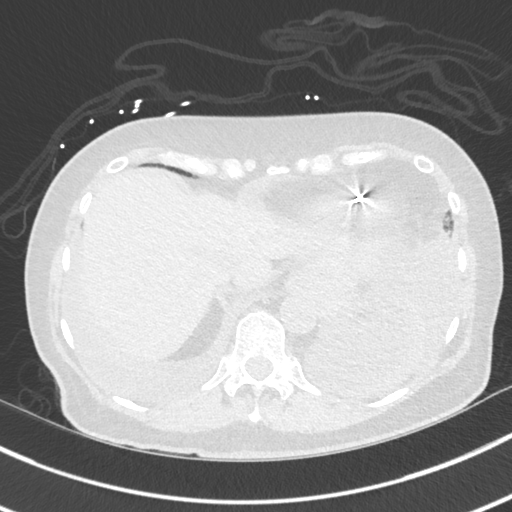
[im 60/168  mediastinal]
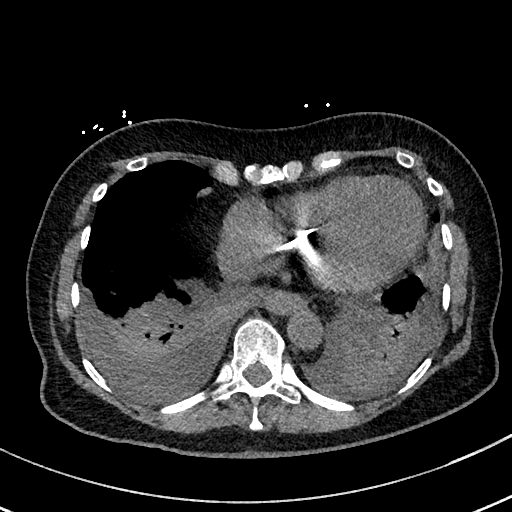
[im 60/168  lung]
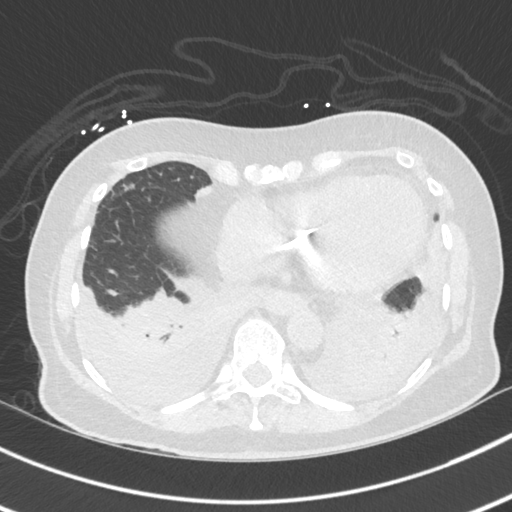
[im 72/168  lung]
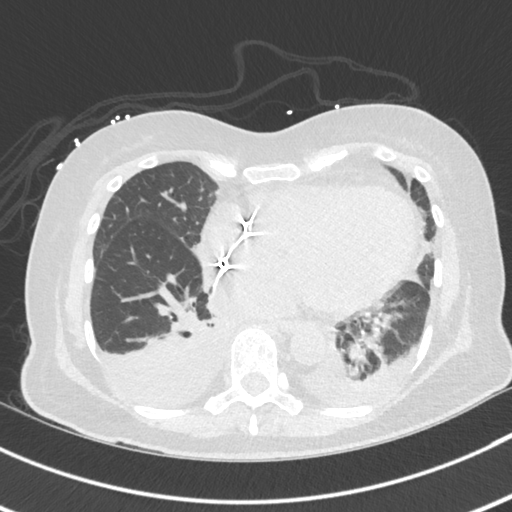
[im 96/168  lung]
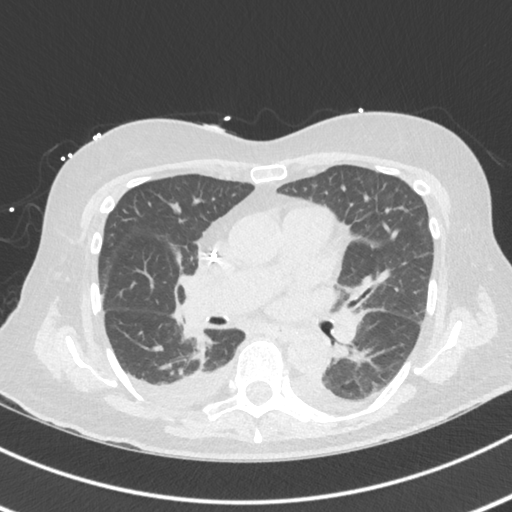
[im 108/168  lung]
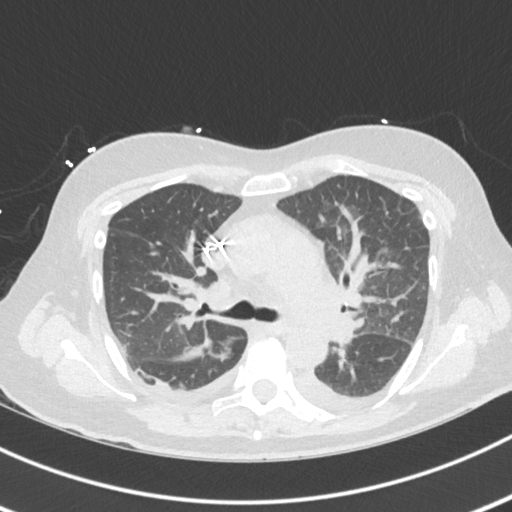
[im 120/168  mediastinal]
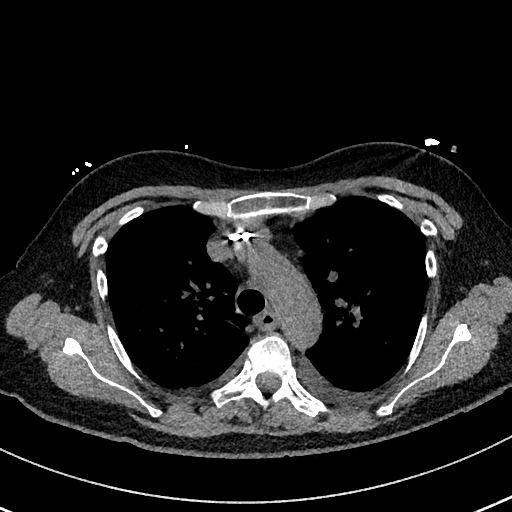
[im 120/168  lung]
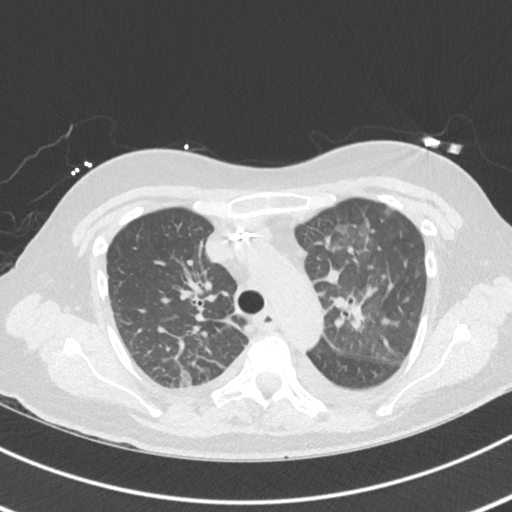
[im 132/168  lung]
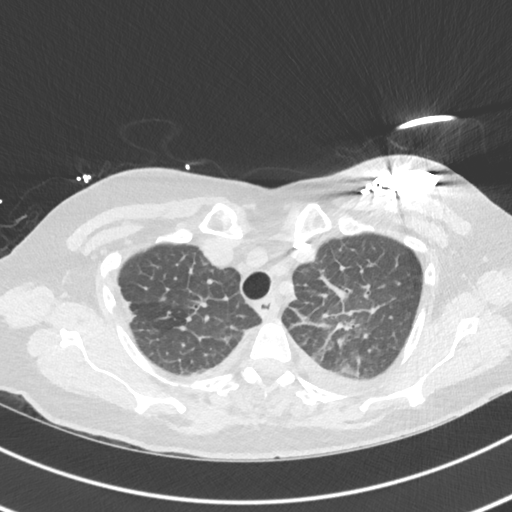
[im 144/168  lung]
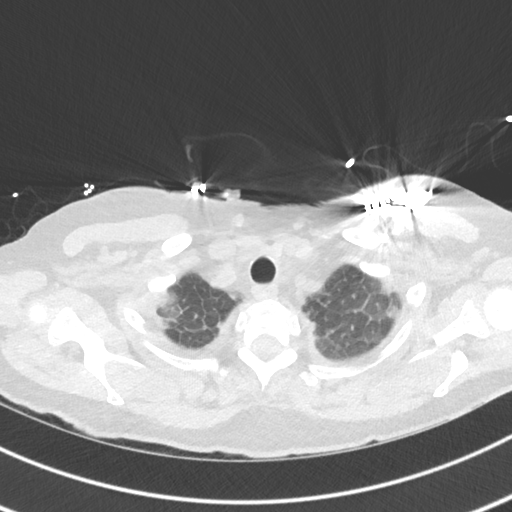
[im 156/168  lung]
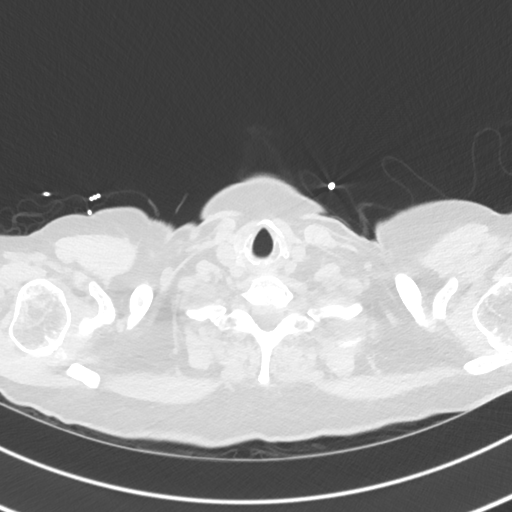

[Series 5: chest w/o 2mm st cor · coronal · non-contrast · 0.65mm/px · 3 of 109 slices shown]
[im 22/109  lung]
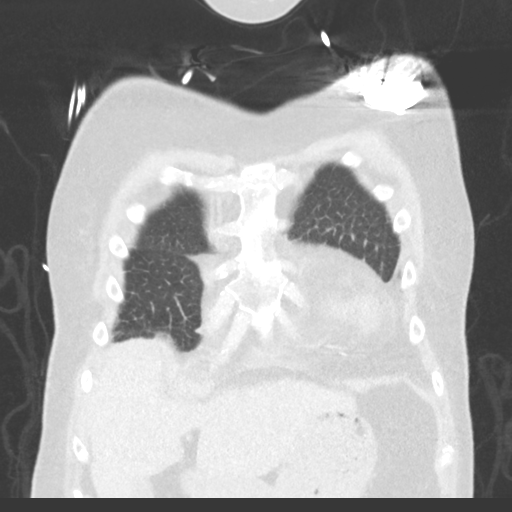
[im 44/109  lung]
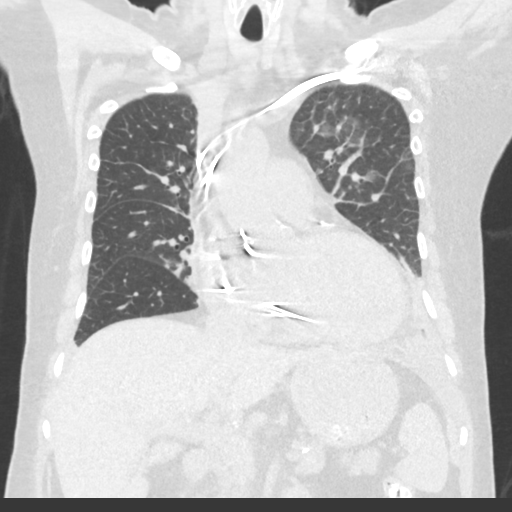
[im 65/109  lung]
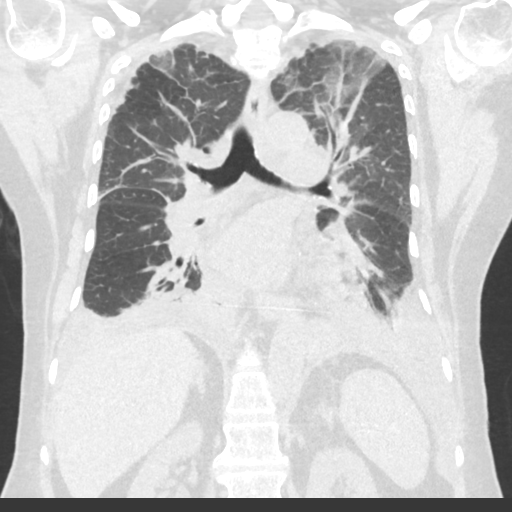

[15 of 36 positions shown; findings below may reference images not displayed]

FINDINGS: Cardiovascular: Atherosclerosis of thoracic aorta is noted without
aneurysm formation. Mild cardiomegaly is noted. Left-sided pacemaker
is noted.

Mediastinum/Nodes: Moderate size sliding-type hiatal hernia is
noted. No adenopathy is noted. Thyroid gland is unremarkable.

Lungs/Pleura: No pneumothorax is noted. Small bilateral pleural
effusions are noted with adjacent subsegmental atelectasis of both
lower lobes. Mild septal thickening is noted in both upper lobes
concerning for pulmonary edema.

Upper Abdomen: No acute abnormality.

Musculoskeletal: No chest wall mass or suspicious bone lesions
identified.
IMPRESSION: Small bilateral pleural effusions are noted with adjacent
subsegmental atelectasis of both lower lobes.

Mild septal thickening is noted in both upper lobes concerning for
pulmonary edema.

Moderate size sliding-type hiatal hernia.

Aortic Atherosclerosis ([id number].C).

## 2022-02-13 NOTE — Progress Notes (Unsigned)
Cardiology Office Note    Date:  02/14/2022   ID:  Alexandria French, DOB [birth year], MRN [id number]  PCP:  Calera Nation, MD  Cardiologist: Sanda Klein, MD    Chief Complaint  Patient presents with   Follow-up    3 month visit    History of Present Illness:    Alexandria French is a 71 y.o. female with past medical history of paroxysmal atrial fibrillation, SSS (s/p [employer] PPM placement in 01/2020), LBBB, HOCM, history of colon cancer (s/p hemicolectomy in 2022), anemia and elevated calcium score (normal cors by cath in 11/2020) who presents to the office today for 18-monthfollow-up.  She was examined by Dr. CSallyanne Kusterin 10/2021 and reported occasional dizziness but no syncope or palpitations. No changes were made to her cardiac regimen at that time and she was continued on Bisoprolol 10 mg in AM/5 mg in PM, Eliquis 5 mg twice daily and Simvastatin 20 mg daily.  She did have a colonoscopy in the interim which showed patent ileocolonic anastomosis along with polyps, diverticulosis and internal hemorrhoids.  In talking with the patient today, she reports overall doing well since her last office visit. She does have intermittent dyspnea on exertion but says this has overall been stable for the past year. She denies any recent chest pain or persistent palpitations. No recent orthopnea, PND or pitting edema. Reports her blood pressure is occasionally elevated at home but she does check this prior to taking her morning medications.   Past Medical History:  Diagnosis Date   A-fib ([facility]    CAD (coronary artery disease)    Colon adenocarcinoma ([facility]    resected no other treatment   Complication of anesthesia    sometimes BP flucuates   Diverticulosis    Esophageal reflux    no meds   External hemorrhoids    Gastritis    Hypertension    IDA (iron deficiency anemia)    Internal hemorrhoids    Other and unspecified hyperlipidemia    Presence of permanent cardiac  pacemaker 2021   bost sci   SSS (sick sinus syndrome) ([facility]     Past Surgical History:  Procedure Laterality Date   ABDOMINAL HYSTERECTOMY  08/2008   BIOPSY  12/20/2020   Procedure: BIOPSY;  Surgeon: GGatha Mayer MD;  [facility]  Service: Endoscopy;;   BIOPSY  01/03/2022   Procedure: BIOPSY;  Surgeon: PJerene Bears MD;  Location: [facility];  Service: Gastroenterology;;   BLADDER REPAIR     COLONOSCOPY WITH PROPOFOL N/A 12/20/2020   Procedure: COLONOSCOPY WITH PROPOFOL;  Surgeon: GGatha Mayer MD;  [facility]  Service: Endoscopy;  Laterality: N/A;   COLONOSCOPY WITH PROPOFOL N/A 01/03/2022   Procedure: COLONOSCOPY WITH PROPOFOL;  Surgeon: PJerene Bears MD;  [facility]: [facility];  Service: Gastroenterology;  Laterality: N/A;   HEMOSTASIS CLIP PLACEMENT  01/03/2022   Procedure: HEMOSTASIS CLIP PLACEMENT;  Surgeon: PJerene Bears MD;  [facility];  Service: Gastroenterology;;   LAPAROSCOPIC PARTIAL COLECTOMY N/A 12/22/2020   Procedure: LAPAROSCOPIC ASSISTED PARTIAL COLECTOMY;  Surgeon: BCoralie Keens MD;  [facility]  Service: General;  Laterality: N/A;   PACEMAKER IMPLANT N/A 02/28/2020   Procedure: PACEMAKER IMPLANT;  Surgeon: CSanda Klein MD;  [facility];  Service: Cardiovascular;  Laterality: N/A;   POLYPECTOMY  01/03/2022   Procedure: POLYPECTOMY;  Surgeon: PJerene Bears MD;  [facility];  Service: Gastroenterology;;   RIGHT/LEFT HEART CATH  AND CORONARY ANGIOGRAPHY N/A 12/15/2020   Procedure: RIGHT/LEFT HEART CATH AND CORONARY ANGIOGRAPHY;  Surgeon: Leonie Man, MD;  Location: [place] [facility];  Service: Cardiovascular;  Laterality: N/A;   TONSILLECTOMY      Current Medications: Outpatient Medications Prior to Visit  Medication Sig Dispense Refill   acetaminophen (TYLENOL) 500 MG tablet Take 2 tablets (1,000 mg total) by mouth every 6 (six) hours as needed for fever or mild pain. (Patient  taking differently: Take 500 mg by mouth every 6 (six) hours as needed for fever or mild pain.) 30 tablet 0   bisoprolol (ZEBETA) 10 MG tablet Take 10 mg (one tablet) in the morning and 5 mg (half a tablet) in the evening. 45 tablet 5   Calcium Carbonate-Vitamin D 600-400 MG-UNIT tablet Take 1 tablet by mouth 2 (two) times daily.      cetirizine (ZYRTEC) 10 MG tablet Take 10 mg by mouth every evening.      Coenzyme Q10 200 MG capsule Take 200 mg by mouth daily.     diltiazem (CARDIZEM) 30 MG tablet Take 1 tablet (30 mg total) by mouth 3 (three) times daily as needed (palpitations). 30 tablet 3   diphenhydrAMINE (BENADRYL) 25 MG tablet Take 12.5 mg by mouth at bedtime.     ELIQUIS 5 MG TABS tablet TAKE 1 TABLET BY MOUTH TWICE A DAY 180 tablet 1   ferrous sulfate 325 (65 FE) MG EC tablet Take 325 mg by mouth at bedtime.     melatonin 5 MG TABS Take 2.5 mg by mouth at bedtime.     simvastatin (ZOCOR) 20 MG tablet Take 20 mg by mouth daily.     vitamin C (ASCORBIC ACID) 500 MG tablet Take 500 mg by mouth daily.     meclizine (ANTIVERT) 25 MG tablet Take 25 mg by mouth 3 (three) times daily as needed. (Patient not taking: Reported on 12/27/2021)     No facility-administered medications prior to visit.     Allergies:   Cefzil [cefprozil]   Social History   Socioeconomic History   Marital status: Married    Spouse name: Not on file   Number of children: Not on file   Years of education: Not on file   Highest education level: Not on file  Occupational History   Not on file  Tobacco Use   Smoking status: Never   Smokeless tobacco: Never  Vaping Use   Vaping Use: Never used  Substance and Sexual Activity   Alcohol use: No    Alcohol/week: 0.0 standard drinks of alcohol   Drug use: No   Sexual activity: Not on file  Other Topics Concern   Not on file  Social History Narrative   Not on file   Social Determinants of Health   Financial Resource Strain: Not on file  Food Insecurity:  Not on file  Transportation Needs: Not on file  Physical Activity: Not on file  Stress: Not on file  Social Connections: Not on file     Family History:  The patient's family history includes Arthritis in an other family member; Heart disease in an other family member; Osteoporosis in an other family member.   Review of Systems:    Please see the history of present illness.     All other systems reviewed and are otherwise negative except as noted above.   Physical Exam:    VS:  BP 122/74   Pulse 61   Ht '5\' 2"'$  (1.575 m)  Wt 140 lb 6.4 oz (63.7 kg)   SpO2 97%   BMI 25.68 kg/m    General: Well developed, well nourished,female appearing in no acute distress. Head: Normocephalic, atraumatic. Neck: No carotid bruits. JVD not elevated.  Lungs: Respirations regular and unlabored, without wheezes or rales.  Heart: Regular rate and rhythm. No S3 or S4.  No murmur, no rubs, or gallops appreciated. Abdomen: Appears non-distended. No obvious abdominal masses. Msk:  Strength and tone appear normal for age. No obvious joint deformities or effusions. Extremities: No clubbing or cyanosis. No pitting edema.  Distal pedal pulses are 2+ bilaterally. Neuro: Alert and oriented X 3. Moves all extremities spontaneously. No focal deficits noted. Psych:  Responds to questions appropriately with a normal affect. Skin: No rashes or lesions noted  Wt Readings from Last 3 Encounters:  02/14/22 140 lb 6.4 oz (63.7 kg)  01/07/22 142 lb 2 oz (64.5 kg)  01/03/22 140 lb (63.5 kg)     Studies/Labs Reviewed:   EKG:  EKG is not ordered today.    Recent Labs: 01/07/2022: ALT 22; BUN 12; Creatinine 0.89; Hemoglobin 13.3; Platelet Count 196; Potassium 3.7; Sodium 142   Lipid Panel No results found for: "CHOL", "TRIG", "HDL", "CHOLHDL", "VLDL", "LDLCALC", "LDLDIRECT"  Additional studies/ records that were reviewed today include:   R/LHC: 24/0973   LV end diastolic pressure is severely elevated.    Hemodynamic findings consistent with mild pulmonary hypertension.   SUMMARY Angiographically Normal Coronary arteries with a left dominant system. Significant LVOT gradient with apical LV pressures of 184/12 mmHg with an EDP of 25, mid LV cavity 157/9 mmHg with EDP of 25 and outflow tract pressure of 111/9 mmHg with a stable EDP of 24 mmHg. Mild Secondary Pulmonary Hypertension with mean PAP of 29 mmHg and PCWP of 29 mmHg. Moderately reduced cardiac function with a cardiac output-index of 3.26 and 2.02 by Fick and 3.55-2.2 by thermodilution.     RECOMMENDATIONS Gentle titration of medications to allow for diuresis without leading to systemic hypotension. Defer decision making to rounding cardiology service.  Echocardiogram: 11/2020 IMPRESSIONS     1. Left ventricular ejection fraction, by estimation, is 55-60%. The left  ventricle demonstrates regional wall motion abnormalities that are  consistent with a left bundle branch block.There is severe asymmetric left  ventricular hypertrophy. Left  ventricular diastolic parameters are consistent with Grade I diastolic  dysfunction (impaired relaxation). No significant LVOT Obstructive seen in  this study.   2. Right ventricular systolic function is normal. The right ventricular  size is normal. There is normal pulmonary artery systolic pressure.   3. The mitral valve is normal in structure. Mild mitral valve  regurgitation. No evidence of mitral stenosis.   4. The aortic valve was not well visualized. Aortic valve regurgitation  is trivial. No aortic stenosis is present.   5. The inferior vena cava is normal in size with <50% respiratory  variability, suggesting right atrial pressure of 8 mmHg.   6. Technically difficult acquisition; in future studies consider use of  echo-contrast.   Assessment:    1. Paroxysmal atrial fibrillation (HCC)   2. SSS (sick sinus syndrome) (HCC)   3. Elevated coronary artery calcium score   4. HOCM  (hypertrophic obstructive cardiomyopathy) ([place])   5. Essential hypertension      Plan:   In order of problems listed above:  1. Paroxysmal Atrial Fibrillation - She reports her palpitations have overall been well controlled. Continue Bisoprolol 10 mg in AM/5 mg in  PM. She does have an Rx for short-acting Cardizem 30 mg as needed but has not utilized this recently. - She is on Eliquis 5 mg twice daily for anticoagulation which is the appropriate dose at this time given her age, weight and kidney function. No reports of active bleeding. CBC last month showed her hemoglobin was overall stable at 13.3 with platelets at 196 K.  2. SSS - She is s/p [facility] Scientific PPM placement in 01/2020 which is followed by Dr. Sallyanne Kuster. Device interrogation in 11/2021 showed normal device function.   3. LBBB/Elevated Calcium Score - Cardiac catheterization in 11/2020 showed normal coronary arteries. No indication for further cardiac testing at this time. She remains on Simvastatin 20 mg daily and we will request a copy of most recent labs from her PCP.  4. HOCM - Most recent echocardiogram in 11/2020 showed a preserved EF of 55 to 60% with severe asymmetric LVH but no significant LVOT obstruction. Remains on Bisoprolol 10 mg in AM/5 mg in PM.  5. HTN - Her BP was initially at 136/74, rechecked and improved to 122/74. Continue current medical therapy.  Medication Adjustments/Labs and Tests Ordered: Current medicines are reviewed at length with the patient today.  Concerns regarding medicines are outlined above.  Medication changes, Labs and Tests ordered today are listed in the Patient Instructions below. Patient Instructions  Medication Instructions:  Your physician recommends that you continue on your current medications as directed. Please refer to the Current Medication list given to you today.   Labwork: None today  Testing/Procedures: None today  Follow-Up: Dr.Croitrou in 3-4  months   [place] Kaide Gage,PA-c in 6-7 months  Any Other Special Instructions Will Be Listed Below (If Applicable).  If you need a refill on your cardiac medications before your next appointment, please call your pharmacy.    Signed, Erma Heritage, PA-C  02/14/2022 2:44 PM    [facility]. [address] [place], [place] [address] Phone: [phone] Fax: [phone]

## 2022-02-14 ENCOUNTER — Ambulatory Visit: Payer: Medicare PPO | Attending: Student | Admitting: Student

## 2022-02-14 ENCOUNTER — Encounter: Payer: Self-pay | Admitting: Student

## 2022-02-14 VITALS — BP 122/74 | HR 61 | Ht 62.0 in | Wt 140.4 lb

## 2022-02-14 DIAGNOSIS — I10 Essential (primary) hypertension: Secondary | ICD-10-CM

## 2022-02-14 DIAGNOSIS — I421 Obstructive hypertrophic cardiomyopathy: Secondary | ICD-10-CM

## 2022-02-14 DIAGNOSIS — I480 Paroxysmal atrial fibrillation: Secondary | ICD-10-CM

## 2022-02-14 DIAGNOSIS — I495 Sick sinus syndrome: Secondary | ICD-10-CM | POA: Diagnosis not present

## 2022-02-14 DIAGNOSIS — R931 Abnormal findings on diagnostic imaging of heart and coronary circulation: Secondary | ICD-10-CM

## 2022-02-14 NOTE — Patient Instructions (Signed)
Medication Instructions:  Your physician recommends that you continue on your current medications as directed. Please refer to the Current Medication list given to you today.   Labwork: None today  Testing/Procedures: None today  Follow-Up: Dr.Croitrou in 3-4 months   [place] Strader,PA-c in 6-7 months  Any Other Special Instructions Will Be Listed Below (If Applicable).  If you need a refill on your cardiac medications before your next appointment, please call your pharmacy.

## 2022-02-15 IMAGING — CT CT ABD-PELV W/ CM
2 of 5 series · 16 of 46 positions shown, 18 images · IV contrast (omnipaque)
Comparison: None.

CLINICAL DATA: Probable abdominal mass.

EXAM:
CT ABDOMEN AND PELVIS WITH CONTRAST
TECHNIQUE: Multidetector CT imaging of the abdomen and pelvis was performed
using the standard protocol following bolus administration of
intravenous contrast.
CONTRAST:  75mL OMNIPAQUE IOHEXOL 350 MG/ML SOLN

[Series 3: abdomen 5.0 · axial · 0.78mm/px · z∈[-134,+286]mm · 13 of 98 slices shown, 15 images]
[im 7/98  soft-tissue]
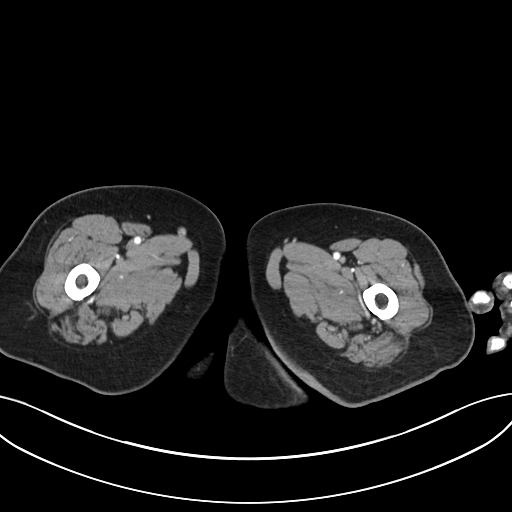
[im 7/98  bone]
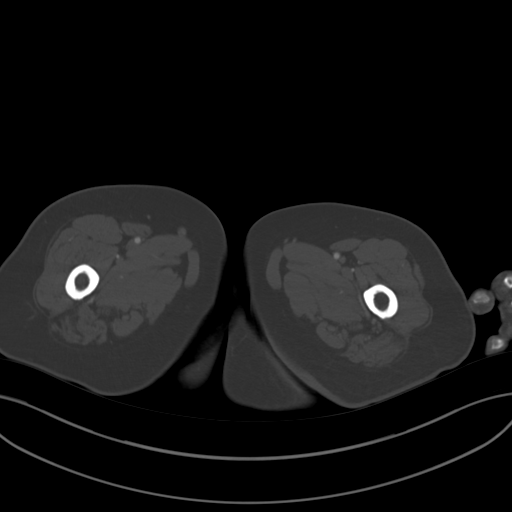
[im 13/98  soft-tissue]
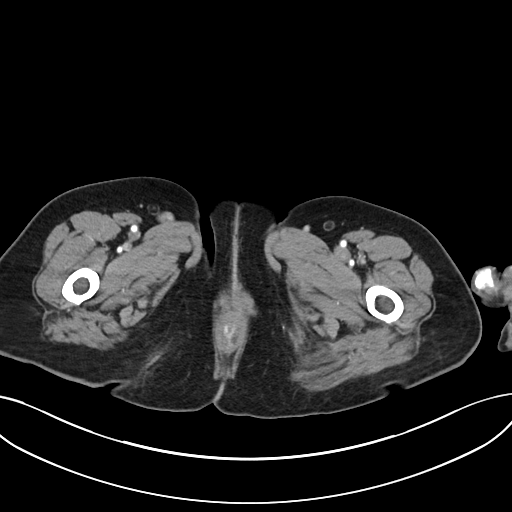
[im 20/98  soft-tissue]
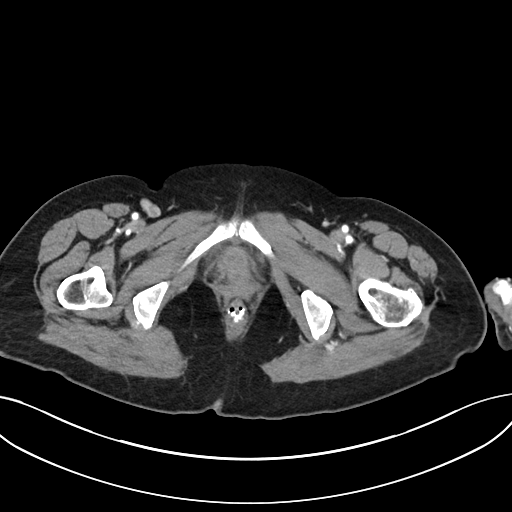
[im 26/98  soft-tissue]
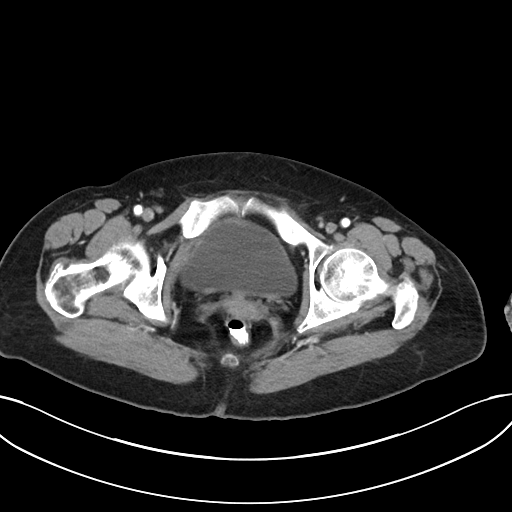
[im 33/98  soft-tissue]
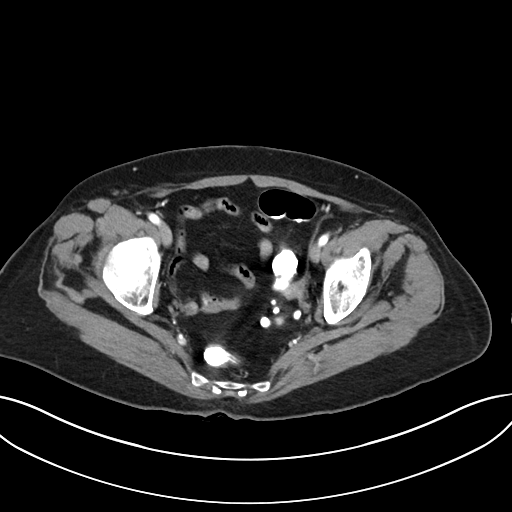
[im 39/98  soft-tissue]
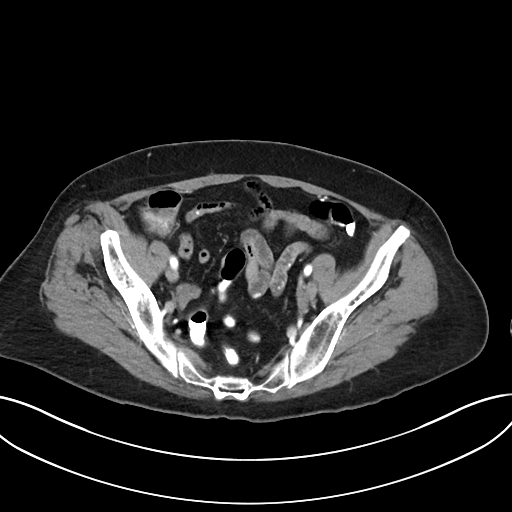
[im 52/98  soft-tissue]
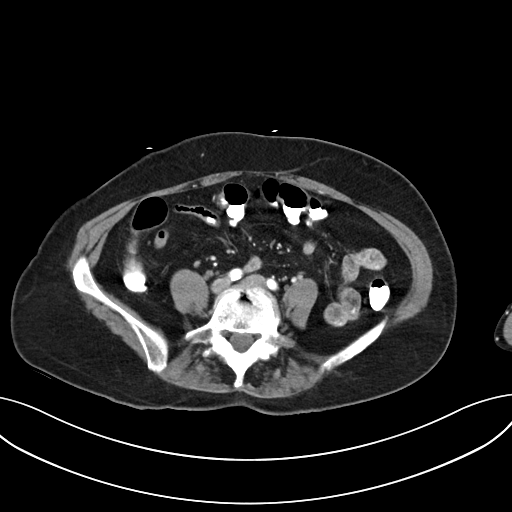
[im 59/98  soft-tissue]
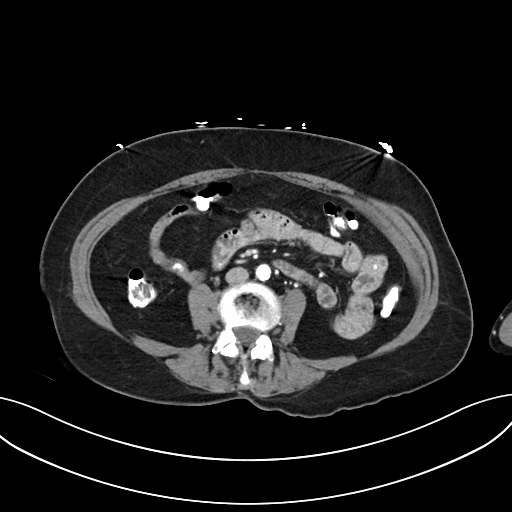
[im 65/98  soft-tissue]
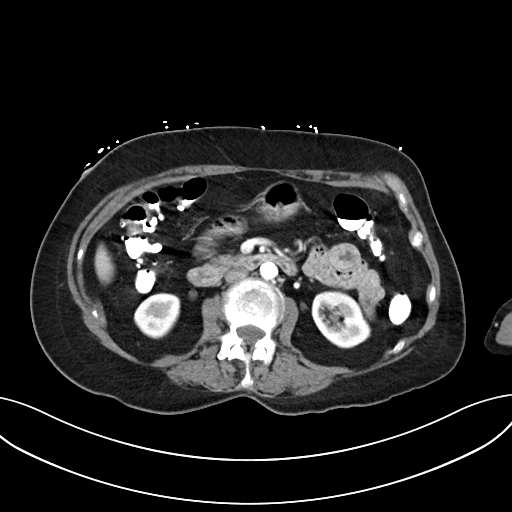
[im 65/98  bone]
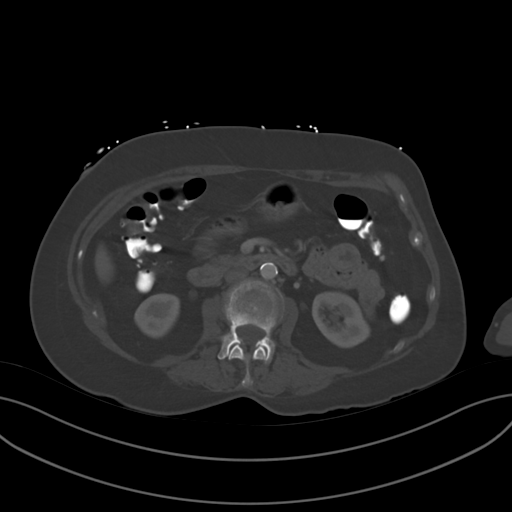
[im 72/98  soft-tissue]
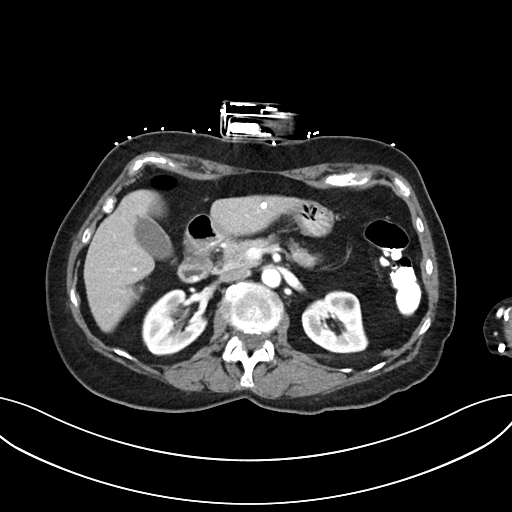
[im 78/98  soft-tissue]
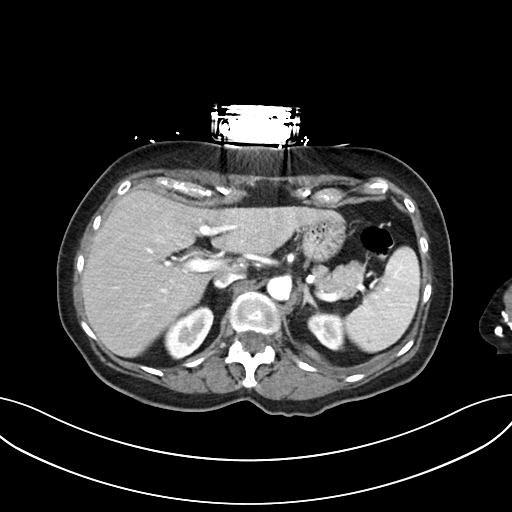
[im 85/98  soft-tissue]
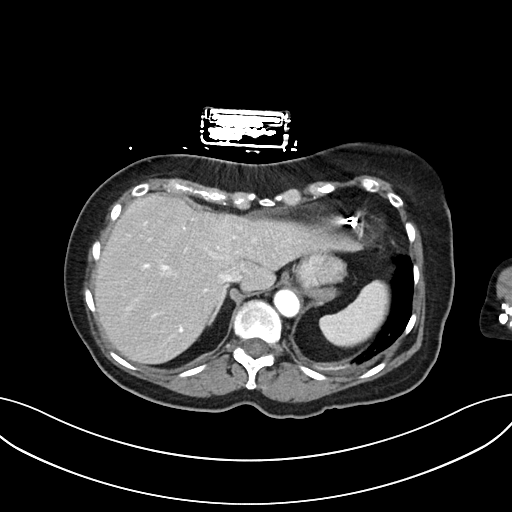
[im 91/98  soft-tissue]
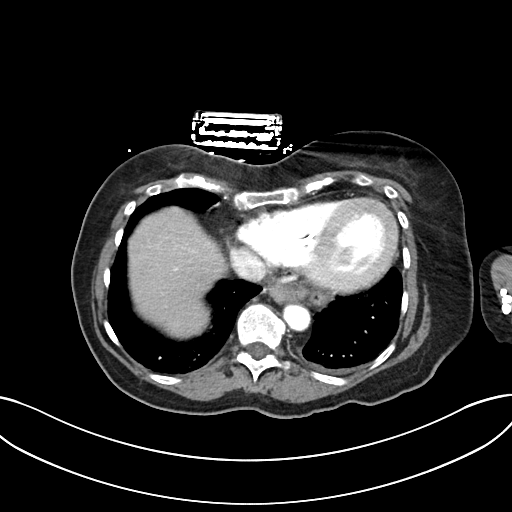

[Series 6: abdomen 3.0 mpr cor · coronal · 0.77mm/px · 3 of 82 slices shown]
[im 28/82  soft-tissue]
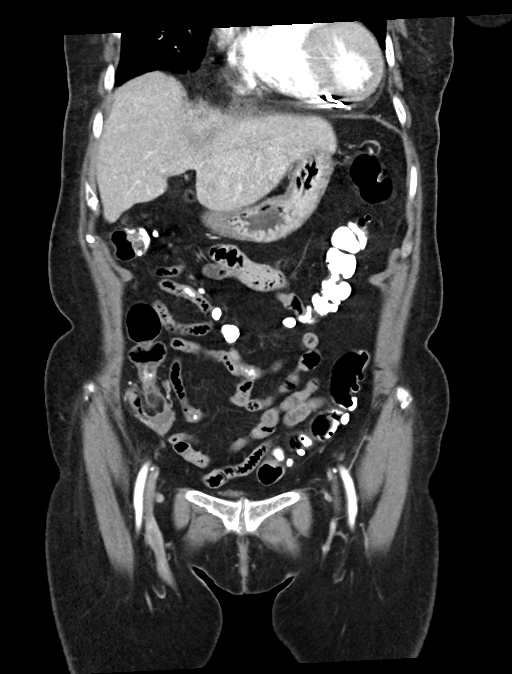
[im 37/82  soft-tissue]
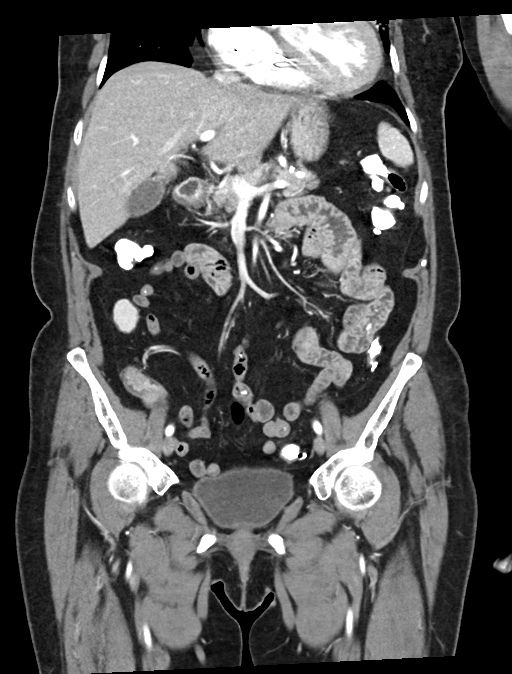
[im 46/82  soft-tissue]
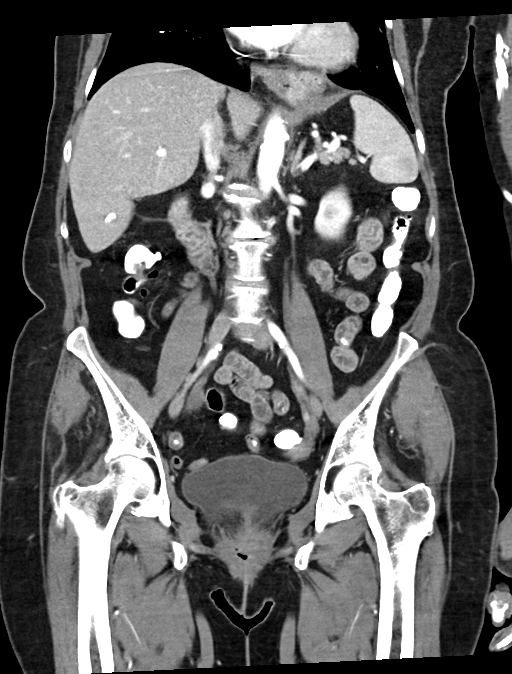

[16 of 46 positions shown; findings below may reference images not displayed]

FINDINGS: Lower chest: Trace bilateral pleural effusions with minimal
bibasilar atelectasis. Cardiac pacemaker wires noted.

No intra-abdominal free air or free fluid.

Hepatobiliary: Focal area of calcification of the posterior aspect
of the inferior right liver capsule likely sequela of prior insult.
The liver is otherwise unremarkable. No intrahepatic biliary ductal
dilatation. The gallbladder is unremarkable.

Pancreas: Unremarkable. No pancreatic ductal dilatation or
surrounding inflammatory changes.

Spleen: Normal in size without focal abnormality.

Adrenals/Urinary Tract: The adrenal glands unremarkable. There is a
15 mm left renal upper pole cyst. There is no hydronephrosis on
either side. The visualized ureters and urinary bladder appear
unremarkable.

Stomach/Bowel: There is colonic diverticulosis without active
inflammatory changes. Small hiatal hernia. There is no bowel
obstruction or active inflammation. The appendix is normal.

Vascular/Lymphatic: Moderate aortoiliac atherosclerotic disease. The
IVC is unremarkable. No portal venous gas. There is no adenopathy.

Reproductive: Hysterectomy.  No adnexal masses.

Other: None

Musculoskeletal: Degenerative changes of the spine. No acute osseous
pathology.
IMPRESSION: 1. No acute intra-abdominal or pelvic pathology.
2. Colonic diverticulosis. No bowel obstruction. Normal appendix.
3. Trace bilateral pleural effusions.
4. Aortic Atherosclerosis ([id number]).

## 2022-02-23 ENCOUNTER — Other Ambulatory Visit: Payer: Self-pay | Admitting: Cardiovascular Disease

## 2022-02-26 ENCOUNTER — Other Ambulatory Visit: Payer: Self-pay

## 2022-02-27 ENCOUNTER — Other Ambulatory Visit: Payer: Self-pay | Admitting: Cardiovascular Disease

## 2022-03-05 ENCOUNTER — Ambulatory Visit (INDEPENDENT_AMBULATORY_CARE_PROVIDER_SITE_OTHER): Payer: Medicare PPO

## 2022-03-05 DIAGNOSIS — I421 Obstructive hypertrophic cardiomyopathy: Secondary | ICD-10-CM | POA: Diagnosis not present

## 2022-03-05 DIAGNOSIS — Z950 Presence of cardiac pacemaker: Secondary | ICD-10-CM

## 2022-03-05 LAB — CUP PACEART REMOTE DEVICE CHECK
Battery Remaining Longevity: 108 mo
Battery Remaining Percentage: 100 %
Brady Statistic RA Percent Paced: 98 %
Brady Statistic RV Percent Paced: 98 %
Date Time Interrogation Session: 20231205021200
Implantable Lead Connection Status: 753985
Implantable Lead Connection Status: 753985
Implantable Lead Implant Date: 20211129
Implantable Lead Implant Date: 20211129
Implantable Lead Location: 753859
Implantable Lead Location: 753860
Implantable Lead Model: 7840
Implantable Lead Model: 7841
Implantable Lead Serial Number: 1036898
Implantable Lead Serial Number: 1100104
Implantable Pulse Generator Implant Date: 20211129
Lead Channel Impedance Value: 674 Ohm
Lead Channel Impedance Value: 931 Ohm
Lead Channel Pacing Threshold Amplitude: 0.4 V
Lead Channel Pacing Threshold Amplitude: 0.7 V
Lead Channel Pacing Threshold Pulse Width: 0.4 ms
Lead Channel Pacing Threshold Pulse Width: 0.4 ms
Lead Channel Setting Pacing Amplitude: 3.5 V
Lead Channel Setting Pacing Amplitude: 3.5 V
Lead Channel Setting Pacing Pulse Width: 0.4 ms
Lead Channel Setting Sensing Sensitivity: 2.5 mV
Pulse Gen Serial Number: 951099
Zone Setting Status: 755011

## 2022-04-03 NOTE — Progress Notes (Signed)
Remote pacemaker transmission.   

## 2022-05-20 ENCOUNTER — Other Ambulatory Visit: Payer: Self-pay | Admitting: Cardiovascular Disease

## 2022-06-04 ENCOUNTER — Ambulatory Visit (INDEPENDENT_AMBULATORY_CARE_PROVIDER_SITE_OTHER): Payer: Medicare PPO

## 2022-06-04 DIAGNOSIS — I421 Obstructive hypertrophic cardiomyopathy: Secondary | ICD-10-CM | POA: Diagnosis not present

## 2022-06-07 LAB — CUP PACEART REMOTE DEVICE CHECK
Battery Remaining Longevity: 108 mo
Battery Remaining Percentage: 100 %
Brady Statistic RA Percent Paced: 97 %
Brady Statistic RV Percent Paced: 98 %
Date Time Interrogation Session: 20240307115000
Implantable Lead Connection Status: 753985
Implantable Lead Connection Status: 753985
Implantable Lead Implant Date: 20211129
Implantable Lead Implant Date: 20211129
Implantable Lead Location: 753859
Implantable Lead Location: 753860
Implantable Lead Model: 7840
Implantable Lead Model: 7841
Implantable Lead Serial Number: 1036898
Implantable Lead Serial Number: 1100104
Implantable Pulse Generator Implant Date: 20211129
Lead Channel Impedance Value: 663 Ohm
Lead Channel Impedance Value: 877 Ohm
Lead Channel Pacing Threshold Amplitude: 0.5 V
Lead Channel Pacing Threshold Amplitude: 0.7 V
Lead Channel Pacing Threshold Pulse Width: 0.4 ms
Lead Channel Pacing Threshold Pulse Width: 0.4 ms
Lead Channel Setting Pacing Amplitude: 3.5 V
Lead Channel Setting Pacing Amplitude: 3.5 V
Lead Channel Setting Pacing Pulse Width: 0.4 ms
Lead Channel Setting Sensing Sensitivity: 2.5 mV
Pulse Gen Serial Number: 951099
Zone Setting Status: 755011

## 2022-06-13 ENCOUNTER — Ambulatory Visit: Payer: Medicare PPO | Attending: Cardiovascular Disease | Admitting: Cardiovascular Disease

## 2022-06-13 ENCOUNTER — Encounter: Payer: Self-pay | Admitting: Cardiovascular Disease

## 2022-06-13 VITALS — BP 126/74 | HR 64 | Ht 62.0 in | Wt 140.0 lb

## 2022-06-13 DIAGNOSIS — E7800 Pure hypercholesterolemia, unspecified: Secondary | ICD-10-CM | POA: Diagnosis not present

## 2022-06-13 DIAGNOSIS — R931 Abnormal findings on diagnostic imaging of heart and coronary circulation: Secondary | ICD-10-CM | POA: Diagnosis not present

## 2022-06-13 DIAGNOSIS — I10 Essential (primary) hypertension: Secondary | ICD-10-CM | POA: Diagnosis not present

## 2022-06-13 DIAGNOSIS — D6869 Other thrombophilia: Secondary | ICD-10-CM

## 2022-06-13 DIAGNOSIS — Z950 Presence of cardiac pacemaker: Secondary | ICD-10-CM | POA: Diagnosis not present

## 2022-06-13 DIAGNOSIS — I480 Paroxysmal atrial fibrillation: Secondary | ICD-10-CM | POA: Diagnosis not present

## 2022-06-13 DIAGNOSIS — I495 Sick sinus syndrome: Secondary | ICD-10-CM

## 2022-06-13 DIAGNOSIS — I421 Obstructive hypertrophic cardiomyopathy: Secondary | ICD-10-CM | POA: Diagnosis not present

## 2022-06-13 NOTE — Progress Notes (Signed)
Cardiology Office Note:    Date:  06/13/2022   ID:  Alexandria French, DOB 12-03-1950, MRN [id number]  PCP:  [place] Nation, MD  [facility] Cardiologist:  Bren[facility] Electrophysiologist:  None   Referring MD: Warrick Nation, MD   No chief complaint on file.    History of Present Illness:    Alexandria French is a 72 y.o. female with a hx of paroxysmal atrial fibrillation, left bundle branch block, HOCM, sinus bradycardia, elevated coronary calcium score without significant coronary stenosis by coronary CT angiogram (12/2018).  Feels better on beta-blockers, but the dose was limited by bradycardia.  Underwent pacemaker implantation in late November 2021.  Symptomatic anemia following initiation of anticoagulants led to a diagnosis of adenocarcinoma of the right colon for which she underwent hemicolectomy in 2022.  Although she is doing better than she was a couple of years ago, she still feels that she is not at full functional ability.  Overall seems to be NYHA functional class II.  Notices shortness of breath especially when she is going up a slight incline from the building in the back of their property back towards the main house.  Can generally complete any desired activity, but feels short of breath while she does it.  She has not had dizziness, syncope, palpitations.  Denies any chest pain.  She has not had lower extremity edema, orthopnea or paroxysmal nocturnal dyspnea.  No falls or bleeding events.  Pacemaker interrogation shows improved heart rate histogram distribution after the most recent adjustments in her pacemaker settings.  She has 97% atrial pacing and 98 % trickier pacing.  There is good heart rate acceleration with the current settings on her minute ventilation and accelerometer sensors.  Lead parameters are stable and within normal range.  She has not had any ventricular tachycardia or atrial fibrillation since the last download.  Estimated generator  longevity is 9 years.  Presenting rhythm was AV sequential pacing with a broad QRS at 160 ms.  In September 2022 she presented with severe symptomatic anemia (hemoglobin around 8) and was found to have a adenocarcinoma of the right colon.  She underwent a partial ileocolectomy on 12/22/2020. She will not require chemotherapy.  Labs showed findings consistent with iron deficiency.  Her most recent hemoglobin on October 10 was 10.7.  Her gastroenterologist is Dr. Carlean Purl.  Her surgeon was Dr. Ninfa Linden.  During the hospitalization she had problems with hypotension.  She underwent cardiac catheterization which showed angiographically normal coronary arteries and confirmed the presence of a significant LV outflow tract gradient.  She was treated with fluids and midodrine.  She subsequently stopped her midodrine since she noticed that her blood pressure was too high.  Prior to hospitalization, he had been treated with bisoprolol which she seemed to tolerate better than metoprolol.  She was discharged from the hospital metoprolol to 50 mg twice daily.  She has had periods of frequent PVCs, often a pattern of trigeminy, that have caused artifactual bradycardia and erratic blood pressure readings.  This has not been the case recently.  She wore an event monitor that showed prominent sinus bradycardia and chronotropic incompetence, with a maximum heart rate but only reached 60% of max predicted.  She did have frequent mostly monomorphic PVCs (6% of all beats).  Also had some brief bursts of nonsustained atrial tachycardia.  There was no complex ventricular arrhythmia. She has not had full syncope previously or documented VT. There is no family history of HCM, VT,  sudden death or CHF.  2 separate echocardiograms performed in August 2020 in May 2021 show findings consistent with hypertrophic obstructive cardiomyopathy, although the degree of left ventricular hypertrophy is relatively mild and appears to be  concentric rather than asymmetrical.  Systolic anterior motion of the mitral valve with increased gradients across the left ventricular outflow tract.  These have been calculated at 145 mmHg and 213 mmHg respectively, although I am not sure that the estimation is accurate.  There seems to be a lot of contamination with mitral regurgitant flow on the Doppler signal.  There is however convincing evidence of gradients across the LVOT of at least 50 mmHg at rest.  On both studies the left ventricular systolic function is hyperdynamic with ejection fraction in excess of 75%.  There is no evidence of major abnormalities of the aortic valve itself.  Coronary CT angiogram performed in September 2020 showed a calcium score in the 85th percentile with no major coronary artery stenoses.  The coronary system is left dominant.  There is very mild bilateral carotid artery plaque with stenoses less than 40%.  Atrial fibrillation was reportedly detected on a previous cardiac monitor in June 2019.  The ventricular rate was over 178 bpm.  The episode was associated with dizziness and lightheadedness.  I am not sure it can accurately be categorized as atrial fibrillation since it was so brief, but she definitely had at least paroxysmal atrial tachycardia.  Although frequent PVCs including ventricular bigeminy were recorded, there was no evidence of ventricular tachycardia.  She has a history of essential hypertension.  She presented with profound anemia and hypotension and was found to have a right colon adenocarcinoma for which she underwent surgery December 22, 2020.  Past Medical History:  Diagnosis Date   A-fib [facility])    CAD (coronary artery disease)    Colon adenocarcinoma ([place])    resected no other treatment   Complication of anesthesia    sometimes BP flucuates   Diverticulosis    Esophageal reflux    no meds   External hemorrhoids    Gastritis    Hypertension    IDA (iron deficiency anemia)    Internal  hemorrhoids    Other and unspecified hyperlipidemia    Presence of permanent cardiac pacemaker 2021   bost sci   SSS (sick sinus syndrome) [facility])     Past Surgical History:  Procedure Laterality Date   ABDOMINAL HYSTERECTOMY  08/2008   BIOPSY  12/20/2020   Procedure: BIOPSY;  Surgeon: Gatha Mayer, MD;  Location: [place];  Service: Endoscopy;;   BIOPSY  01/03/2022   Procedure: BIOPSY;  Surgeon: Jerene Bears, MD;  Location: [facility];  Service: Gastroenterology;;   BLADDER REPAIR     COLONOSCOPY WITH PROPOFOL N/A 12/20/2020   Procedure: COLONOSCOPY WITH PROPOFOL;  Surgeon: Gatha Mayer, MD;  Location: [facility] ENDOSCOPY;  Service: Endoscopy;  Laterality: N/A;   COLONOSCOPY WITH PROPOFOL N/A 01/03/2022   Procedure: COLONOSCOPY WITH PROPOFOL;  Surgeon: Jerene Bears, MD;  Location: [facility]OS[facility];  Service: Gastroenterology;  Laterality: N/A;   HEMOSTASIS CLIP PLACEMENT  01/03/2022   Procedure: HEMOSTASIS CLIP PLACEMENT;  Surgeon: Jerene Bears, MD;  Location: [facility];  Service: Gastroenterology;;   LAPAROSCOPIC PARTIAL COLECTOMY N/A 12/22/2020   Procedure: LAPAROSCOPIC ASSISTED PARTIAL COLECTOMY;  Surgeon: Coralie Keens, MD;  Location: [place];  Service: General;  Laterality: N/A;   PACEMAKER IMPLANT N/A 02/28/2020   Procedure: PACEMAKER IMPLANT;  Surgeon: Sanda Klein, MD;  Location: [place] [facility];  Service: Cardiovascular;  Laterality: N/A;   POLYPECTOMY  01/03/2022   Procedure: POLYPECTOMY;  Surgeon: Jerene Bears, MD;  Location: [facility]OS[facility];  Service: Gastroenterology;;   RIGHT/LEFT HEART CATH AND CORONARY ANGIOGRAPHY N/A 12/15/2020   Procedure: RIGHT/LEFT HEART CATH AND CORONARY ANGIOGRAPHY;  Surgeon: Leonie Man, MD;  Location: [place] [facility];  Service: Cardiovascular;  Laterality: N/A;   TONSILLECTOMY      Current Medications: Current Meds  Medication Sig   augmented betamethasone dipropionate (DIPROLENE-AF) 0.05 % cream Apply topically as needed.    bisoprolol (ZEBETA) 10 MG tablet TAKE 10 MG (ONE TABLET) IN THE MORNING AND 5 MG (HALF A TABLET) IN THE EVENING.   bisoprolol (ZEBETA) 5 MG tablet TAKE 1/2 TABLET BY MOUTH EVERY DAY   Calcium Carbonate-Vitamin D 600-400 MG-UNIT tablet Take 1 tablet by mouth 2 (two) times daily.    cetirizine (ZYRTEC) 10 MG tablet Take 10 mg by mouth every evening.    Coenzyme Q10 200 MG capsule Take 200 mg by mouth daily.   diphenhydrAMINE (BENADRYL) 25 MG tablet Take 12.5 mg by mouth at bedtime.   ELIQUIS 5 MG TABS tablet TAKE 1 TABLET BY MOUTH TWICE A DAY   ferrous sulfate 325 (65 FE) MG EC tablet Take 325 mg by mouth at bedtime.   melatonin 5 MG TABS Take 2.5 mg by mouth at bedtime.   simvastatin (ZOCOR) 20 MG tablet Take 20 mg by mouth daily.   vitamin C (ASCORBIC ACID) 500 MG tablet Take 500 mg by mouth daily.     Allergies:   Cefzil [cefprozil]   Social History   Socioeconomic History   Marital status: Married    Spouse name: Not on file   Number of children: Not on file   Years of education: Not on file   Highest education level: Not on file  Occupational History   Not on file  Tobacco Use   Smoking status: Never   Smokeless tobacco: Never  Vaping Use   Vaping Use: Never used  Substance and Sexual Activity   Alcohol use: No    Alcohol/week: 0.0 standard drinks of alcohol   Drug use: No   Sexual activity: Not on file  Other Topics Concern   Not on file  Social History Narrative   Not on file   Social Determinants of Health   Financial Resource Strain: Not on file  Food Insecurity: Not on file  Transportation Needs: Not on file  Physical Activity: Not on file  Stress: Not on file  Social Connections: Not on file     Family History: The patient's family history includes Arthritis in an other family member; Heart disease in an other family member; Osteoporosis in an other family member. There is no history of Colon cancer or Colon polyps.  Father lived to 55 years old, has an  older sister 46 years old who is healthy, had another sister died at age 69 of a lung infection.  1 child (son), healthy.  ROS:   Please see the history of present illness. All other systems are reviewed and are negative.   EKGs/Labs/Other Studies Reviewed:    The following studies were reviewed today: Cardiac Cath 12/15/2020    LV end diastolic pressure is severely elevated.   Hemodynamic findings consistent with mild pulmonary hypertension.   SUMMARY Angiographically Normal Coronary arteries with a left dominant system. Significant LVOT gradient with apical LV pressures of 184/12 mmHg with an EDP  of 25, mid LV cavity 157/9 mmHg with EDP of 25 and outflow tract pressure of 111/9 mmHg with a stable EDP of 24 mmHg. Mild Secondary Pulmonary Hypertension with mean PAP of 29 mmHg and PCWP of 29 mmHg. Moderately reduced cardiac function with a cardiac output-index of 3.26 and 2.02 by Fick and 3.55-2.2 by thermodilution.     RECOMMENDATIONS Gentle titration of medications to allow for diuresis without leading to systemic hypotension. Defer decision making to rounding cardiology service   Echocardiogram 12/15/2020   1. Left ventricular ejection fraction, by estimation, is 55-60%. The left  ventricle demonstrates regional wall motion abnormalities that are  consistent with a left bundle branch block.There is severe asymmetric left  ventricular hypertrophy. Left  ventricular diastolic parameters are consistent with Grade I diastolic  dysfunction (impaired relaxation). No significant LVOT Obstructive seen in  this study.   2. Right ventricular systolic function is normal. The right ventricular  size is normal. There is normal pulmonary artery systolic pressure.   3. The mitral valve is normal in structure. Mild mitral valve  regurgitation. No evidence of mitral stenosis.   4. The aortic valve was not well visualized. Aortic valve regurgitation  is trivial. No aortic stenosis is present.    5. The inferior vena cava is normal in size with <50% respiratory  variability, suggesting right atrial pressure of 8 mmHg.   6. Technically difficult acquisition; in future studies consider use of  echo-contrast.   Comparison(s): A prior study was performed on 08/27/19. Prior images  reviewed side by side. Compared to prior septal wall motion is new. LVOT  obstructive more prominent on prior study.   Conclusion(s)/Recommendation(s): Consider cardiac MRI for assessment of  hypertrophic cardioymyopathy.  Echocardiogram 08/27/2019   1. LVOT peak gradient 213 mmHg. Left ventricular ejection fraction, by estimation, is 70 to 75%. The left ventricle has hyperdynamic function. The left ventricle has no regional wall motion abnormalities. There is mild concentric left ventricular hypertrophy. Left ventricular diastolic parameters are consistent with Grade I diastolic dysfunction (impaired relaxation). Elevated left atrial pressure. 2. Right ventricular systolic function is normal. The right ventricular size is normal. There is normal pulmonary artery systolic pressure. 3. The mitral valve is grossly normal. Trivial mitral valve regurgitation. 4. The aortic valve is tricuspid. Aortic valve regurgitation is mild. No aortic stenosis is present. 5. The inferior vena cava is normal in size with greater than 50% respiratory variability, suggesting right atrial pressure of 3 mmHg.     Echocardiogram 11/26/2018:   1. The left ventricle has a visually estimated ejection fraction of 75%.  The cavity size was normal. Left ventricular diastolic Doppler parameters  are consistent with impaired relaxation. Elevated left ventricular  end-diastolic pressure No evidence of  left ventricular regional wall motion abnormalities.   2. The right ventricle has normal systolic function. The cavity was  normal. There is no increase in right ventricular wall thickness.   3. The aortic valve is tricuspid. Mild  thickening of the aortic valve.  Aortic valve regurgitation is mild by color flow Doppler. Mild aortic  annular calcification noted.   4. The mitral valve is abnormal. Moderate thickening of the mitral valve  leaflet. Mitral valve regurgitation is moderate by color flow Doppler.   5. Mitral chordal SAM is noted. LVOT is small. Peak gradient 145 mmHg.   6. The tricuspid valve is grossly normal. Tricuspid valve regurgitation  is moderate.   7. The aorta is normal unless otherwise noted.  Coronary CT angiography 12/03/2018:  FINDINGS: Coronary calcium score: The patient's coronary artery calcium score is 149, which places the patient in the 85th percentile. Coronary arteries: Normal coronary origins.  Left dominance Right Coronary Artery: Small, non-dominant vessel which gives off a larger AV nodal branch. No obstructive disease. Left Main Coronary Artery: Prominent left coronary cusp, but not anuerymsal. No stenosis. Gives off larger LAD and LCx arteries. Left Anterior Descending Coronary Artery: Calcified proximal vessel with mild 25-49% (CADRADS2) mixed stenosis. Gives off several small diagonal branches. Ramus Intermedius Artery: Small branch without significant stenosis.   Left Circumflex Artery: Dominant vessel that gives off a small PDA artery. Larger caliber with minimal mixed 1-24% mid-vessel stenosis (CADRADS1).  Aorta: Normal size, 28 mm at the mid ascending aorta (level of the PA bifurcation) measured double oblique. Atherosclerosis. No dissection. Aortic Valve: Trileaflet.  Calcification of the annulus.   Other findings: Thickening of the aortic and mitral valve leaflets. Normal biatrial size Normal pulmonary vein drainage into the left atrium. Normal left atrial appendage without a thrombus. Normal size of the pulmonary artery.   IMPRESSION: 1. Mild, non-obstructive CAD primarily of the LAD, CADRADS = 2. 2. Coronary calcium score of 149. This was 85th percentile for  age and sex matched control. 3. Normal coronary origin with left dominance. 4. Thickening of the aortic and mitral valve leaflets  MCOT monitor 07/14/2017 Sinus rhythm with frequent PVCs. There was one episode of rapid atrial fibrillation.   EKG:  EKG is ordered today.  It shows AV sequential pacing and positive R waves in lead V1-V2 with a broad paced QRS at 166 ms.  QTc 491 ms.   Recent Labs: 01/07/2022: ALT 22; BUN 12; Creatinine 0.89; Hemoglobin 13.3; Platelet Count 196; Potassium 3.7; Sodium 142 07/23/2021 Hemoglobin 14.1, creatinine 0.82, potassium 4.4, ALT 21  Recent Lipid Panel No results found for: "CHOL", "TRIG", "HDL", "CHOLHDL", "VLDL", "LDLCALC", "LDLDIRECT" 10/07/2019 Total cholesterol 171, HDL 56, LDL 86, triglycerides 145  10/10/2020 Cholesterol 164, HDL 48, triglycerides 178, LDL 86  Physical Exam:    VS:  BP 126/74 (BP Location: Left Arm, Patient Position: Sitting, Cuff Size: Normal)   Pulse 64   Ht '5\' 2"'$  (1.575 m)   Wt 140 lb (63.5 kg)   SpO2 96%   BMI 25.61 kg/m     Wt Readings from Last 3 Encounters:  06/13/22 140 lb (63.5 kg)  02/14/22 140 lb 6.4 oz (63.7 kg)  01/07/22 142 lb 2 oz (64.5 kg)       General: Alert, oriented x3, no distress, lean and appears fit him pacemaker site looks healthy. Head: no evidence of trauma, PERRL, EOMI, no exophtalmos or lid lag, no myxedema, no xanthelasma; normal ears, nose and oropharynx Neck: normal jugular venous pulsations and no hepatojugular reflux; brisk carotid pulses without delay and no carotid bruits Chest: clear to auscultation, no signs of consolidation by percussion or palpation, normal fremitus, symmetrical and full respiratory excursions Cardiovascular: normal position and quality of the apical impulse, regular rhythm, normal first and second heart sounds, no murmurs, rubs or gallops.  Unable to elicit any murmur with the Valsalva maneuver Abdomen: no tenderness or distention, no masses by palpation, no  abnormal pulsatility or arterial bruits, normal bowel sounds, no hepatosplenomegaly Extremities: no clubbing, cyanosis or edema; 2+ radial, ulnar and brachial pulses bilaterally; 2+ right femoral, posterior tibial and dorsalis pedis pulses; 2+ left femoral, posterior tibial and dorsalis pedis pulses; no subclavian or femoral bruits Neurological: grossly nonfocal Psych: Normal  mood and affect    ASSESSMENT:    1. HOCM (hypertrophic obstructive cardiomyopathy) ([place])   2. SSS (sick sinus syndrome) ([place])   3. Pacemaker   4. Essential hypertension   5. Paroxysmal atrial fibrillation (HCC)   6. Acquired thrombophilia (HCC)   7. Elevated coronary artery calcium score   8. Hypercholesterolemia       PLAN:    In order of problems listed above:  HOCM: There is no audible murmur at rest or following the Valsalva maneuver or changes in body position today.  This makes it unlikely that she has a severe outflow tract gradient.  However she is still symptomatic.  NYHA functional class II status, not sure that this is due to diastolic left ventricular dysfunction or exercise-induced LVOT gradient.  May need a stress echo.  Will refer to [place] [facility] and Dr. Glenford Bayley.  She may require mavacamten therapy if she still has an inducible gradient. SSS: Heart rate histogram distribution now appears very much close to physiological and I doubt that additional changes in her sensor settings would make any difference in her fatigue or shortness of breath. Pacemaker: Normal device function.  No reprogramming performed HTN:   Well-controlled.  Avoid diuretics and potent vasodilators. AFib: Very low burden of arrhythmia, none in the last 12 months.  On appropriate anticoagulation. CHADSVasc 4 (age, gender, HTN, +/- CAD), but this score is probably irrelevant in setting of HOCM. Anticoagulation: Treatment of Eliquis led to diagnosis of colon cancer.   CAD: She did not have any angiographically significant  coronary stenoses at catheterization but has significant atherosclerotic plaque burden on CT.  No angina.  On statin.  Target LDL less than 70.   HLP: Followed by PCP.     Medication Adjustments/Labs and Tests Ordered: Current medicines are reviewed at length with the patient today.  Concerns regarding medicines are outlined above.  Orders Placed This Encounter  Procedures   Ambulatory referral to Structural Heart/Valve Clinic (only at [place])   Ambulatory referral to Cardiology   EKG 12-Lead   ECHOCARDIOGRAM COMPLETE    No orders of the defined types were placed in this encounter.    Patient Instructions  Medication Instructions:  No changes *If you need a refill on your cardiac medications before your next appointment, please call your pharmacy*  Testing/Procedures: Your physician has requested that you have an echocardiogram. Echocardiography is a painless test that uses sound waves to create images of your heart. It provides your doctor with information about the size and shape of your heart and how well your heart's chambers and valves are working. This procedure takes approximately one hour. There are no restrictions for this procedure. Please do NOT wear cologne, perfume, aftershave, or lotions (deodorant is allowed). Please arrive 15 minutes prior to your appointment time.    Follow-Up: At [facility], you and your health needs are our priority.  As part of our continuing mission to provide you with exceptional heart care, we have created designated Provider Care Teams.  These Care Teams include your primary Cardiologist (physician) and Advanced Practice Providers (APPs -  Physician Assistants and Nurse Practitioners) who all work together to provide you with the care you need, when you need it.  We recommend signing up for the patient portal called "MyChart".  Sign up information is provided on this After Visit Summary.  MyChart is used to connect with  patients for Virtual Visits (Telemedicine).  Patients are able to view lab/test results, encounter  notes, upcoming appointments, etc.  Non-urgent messages can be sent to your provider as well.   To learn more about what you can do with MyChart, go to [email or web address].    Your next appointment:   1 year(s)- Pacer check  Provider:   Sanda Klein, MD     Other Instructions Referral placed    Signed, Sanda Klein, MD  06/13/2022 1:22 PM    [facility]

## 2022-06-13 NOTE — Patient Instructions (Signed)
Medication Instructions:  No changes *If you need a refill on your cardiac medications before your next appointment, please call your pharmacy*  Testing/Procedures: Your physician has requested that you have an echocardiogram. Echocardiography is a painless test that uses sound waves to create images of your heart. It provides your doctor with information about the size and shape of your heart and how well your heart's chambers and valves are working. This procedure takes approximately one hour. There are no restrictions for this procedure. Please do NOT wear cologne, perfume, aftershave, or lotions (deodorant is allowed). Please arrive 15 minutes prior to your appointment time.    Follow-Up: At [facility], you and your health needs are our priority.  As part of our continuing mission to provide you with exceptional heart care, we have created designated Provider Care Teams.  These Care Teams include your primary Cardiologist (physician) and Advanced Practice Providers (APPs -  Physician Assistants and Nurse Practitioners) who all work together to provide you with the care you need, when you need it.  We recommend signing up for the patient portal called "MyChart".  Sign up information is provided on this After Visit Summary.  MyChart is used to connect with patients for Virtual Visits (Telemedicine).  Patients are able to view lab/test results, encounter notes, upcoming appointments, etc.  Non-urgent messages can be sent to your provider as well.   To learn more about what you can do with MyChart, go to [email or web address].    Your next appointment:   1 year(s)- Pacer check  Provider:   Sanda Klein, MD     Other Instructions Referral placed

## 2022-07-15 NOTE — Progress Notes (Signed)
Remote pacemaker transmission.   

## 2022-07-16 ENCOUNTER — Ambulatory Visit (HOSPITAL_COMMUNITY): Payer: Medicare PPO | Attending: Internal Medicine

## 2022-07-16 DIAGNOSIS — I421 Obstructive hypertrophic cardiomyopathy: Secondary | ICD-10-CM

## 2022-07-16 LAB — ECHOCARDIOGRAM COMPLETE
Area-P 1/2: 2.6 cm2
MV M vel: 4.89 m/s
MV Peak grad: 95.6 mmHg
P 1/2 time: 737 msec
S' Lateral: 2.2 cm

## 2022-07-19 ENCOUNTER — Encounter: Payer: Medicare PPO | Admitting: Genetic Counselor

## 2022-07-19 ENCOUNTER — Institutional Professional Consult (permissible substitution): Payer: Medicare PPO | Admitting: Internal Medicine

## 2022-08-06 ENCOUNTER — Other Ambulatory Visit: Payer: Self-pay | Admitting: Cardiovascular Disease

## 2022-08-06 DIAGNOSIS — Z7901 Long term (current) use of anticoagulants: Secondary | ICD-10-CM

## 2022-08-06 NOTE — Telephone Encounter (Signed)
*  STAT* If patient is at the pharmacy, call can be transferred to refill team.   1. Which medications need to be refilled? (please list name of each medication and dose if known) ELIQUIS 5 MG TABS tablet   2. Which pharmacy/location (including street and city if local pharmacy) is medication to be sent to? [id number] - [place], [place] - [address]   3. Do they need a 30 day or 90 day supply? 90

## 2022-08-07 MED ORDER — APIXABAN 5 MG PO TABS: 5.0000 mg | ORAL_TABLET | Freq: Two times a day (BID) | ORAL | 1 refills | Status: DC

## 2022-08-07 NOTE — Telephone Encounter (Signed)
Prescription refill request for Eliquis received. Indication: Afib  Last office visit: 06/13/22 ([facility])  Scr: 0.89 (01/07/22)  Age: 72 Weight: 63.5kg  Appropriate dose. Refill sent.

## 2022-08-27 ENCOUNTER — Encounter: Payer: Self-pay | Admitting: Internal Medicine

## 2022-08-27 ENCOUNTER — Ambulatory Visit: Payer: Medicare PPO | Attending: Internal Medicine | Admitting: Internal Medicine

## 2022-08-27 ENCOUNTER — Ambulatory Visit: Payer: Medicare PPO | Admitting: Genetic Counselor

## 2022-08-27 VITALS — BP 138/72 | HR 73 | Ht 62.0 in | Wt 142.4 lb

## 2022-08-27 DIAGNOSIS — I421 Obstructive hypertrophic cardiomyopathy: Secondary | ICD-10-CM

## 2022-08-27 DIAGNOSIS — I480 Paroxysmal atrial fibrillation: Secondary | ICD-10-CM | POA: Diagnosis not present

## 2022-08-27 DIAGNOSIS — I10 Essential (primary) hypertension: Secondary | ICD-10-CM

## 2022-08-27 MED ORDER — FUROSEMIDE 20 MG PO TABS: 20.0000 mg | ORAL_TABLET | Freq: Every day | ORAL | 11 refills | Status: DC | PRN

## 2022-08-27 NOTE — Patient Instructions (Signed)
Medication Instructions:  Your physician has recommended you make the following change in your medication:  START: furosemide (Lasix) 20 mg by mouth once daily as needed for leg swelling  *If you need a refill on your cardiac medications before your next appointment, please call your pharmacy*   Lab Work: NONE  If you have labs (blood work) drawn today and your tests are completely normal, you will receive your results only by: MyChart Message (if you have MyChart) OR A paper copy in the mail If you have any lab test that is abnormal or we need to change your treatment, we will call you to review the results.   Testing/Procedures: Your physician has requested that you have a stress echocardiogram. For further information please visit [email or web address] Please follow instruction sheet as given.   Follow-Up: At [facility], you and your health needs are our priority.  As part of our continuing mission to provide you with exceptional heart care, we have created designated Provider Care Teams.  These Care Teams include your primary Cardiologist (physician) and Advanced Practice Providers (APPs -  Physician Assistants and Nurse Practitioners) who all work together to provide you with the care you need, when you need it.  We recommend signing up for the patient portal called "MyChart".  Sign up information is provided on this After Visit Summary.  MyChart is used to connect with patients for Virtual Visits (Telemedicine).  Patients are able to view lab/test results, encounter notes, upcoming appointments, etc.  Non-urgent messages can be sent to your provider as well.   To learn more about what you can do with MyChart, go to [email or web address].    Your next appointment:   5-6 month(s)  Provider:   Riley Lam, MD

## 2022-08-27 NOTE — Progress Notes (Signed)
Cardiology Office Note:    Date:  08/27/2022   ID:  Alexandria French, DOB 02/13/1951, MRN [id number]  PCP:  Donetta Potts, MD   [facility] Providers Cardiologist:  Thurmon Fair, MD     Referring MD: Thurmon Fair, MD   CC: HCM Consulted for the evaluation of oHCM at the behest of Dr. Royann Shivers  History of Present Illness:    Alexandria French is a 72 y.o. female with a hx of oHCM< limited in beta blockade due to bradycardiac causing pacing, with SSS s/p PPM with no significant NSVT, HTN, PAF, and non obstructive CAD and HLD. 2024: Seen by Dr. Royann Shivers despite no resting gradient.  Patient notes that she is DOE with even mild  She [place] to [place] and can no long go up the hill. She has mild leg swelling at the end of the day.  Notes moderate fatigue difficult to initiate housework each day. Notes no palpitations Notes no CP. Notes no Dizziness. Notes no syncope.  Notable family events include no family with HCM, no SCD. Her last sibling just passed. Mother died at 109- she wasn't one to go to get check ups.  She was bed bound the last few months of her life. Father lived until 48.  One son.  He has not been screened. No grandchildren.  Relevant testing includes having refractory BP: elevations to 160/100 at most.  Pulse has been elevated the last few weeks.   Past Medical History:  Diagnosis Date   A-fib [facility])    CAD (coronary artery disease)    Colon adenocarcinoma (HCC)    resected no other treatment   Complication of anesthesia    sometimes BP flucuates   Diverticulosis    Esophageal reflux    no meds   External hemorrhoids    Gastritis    Hypertension    IDA (iron deficiency anemia)    Internal hemorrhoids    Other and unspecified hyperlipidemia    Presence of permanent cardiac pacemaker 2021   bost sci   SSS (sick sinus syndrome) [facility])     Past Surgical History:  Procedure Laterality Date   ABDOMINAL HYSTERECTOMY   08/2008   BIOPSY  12/20/2020   Procedure: BIOPSY;  Surgeon: Iva Boop, MD;  Location: [facility] ENDOSCOPY;  Service: Endoscopy;;   BIOPSY  01/03/2022   Procedure: BIOPSY;  Surgeon: Beverley Fiedler, MD;  Location: [facility] ENDOSCOPY;  Service: Gastroenterology;;   BLADDER REPAIR     COLONOSCOPY WITH PROPOFOL N/A 12/20/2020   Procedure: COLONOSCOPY WITH PROPOFOL;  Surgeon: Iva Boop, MD;  Location: [facility] ENDOSCOPY;  Service: Endoscopy;  Laterality: N/A;   COLONOSCOPY WITH PROPOFOL N/A 01/03/2022   Procedure: COLONOSCOPY WITH PROPOFOL;  Surgeon: Beverley Fiedler, MD;  Location: [facility] ENDOSCOPY;  Service: Gastroenterology;  Laterality: N/A;   HEMOSTASIS CLIP PLACEMENT  01/03/2022   Procedure: HEMOSTASIS CLIP PLACEMENT;  Surgeon: Beverley Fiedler, MD;  Location: [facility] ENDOSCOPY;  Service: Gastroenterology;;   LAPAROSCOPIC PARTIAL COLECTOMY N/A 12/22/2020   Procedure: LAPAROSCOPIC ASSISTED PARTIAL COLECTOMY;  Surgeon: Abigail Miyamoto, MD;  Location: [facility] OR;  Service: General;  Laterality: N/A;   PACEMAKER IMPLANT N/A 02/28/2020   Procedure: PACEMAKER IMPLANT;  Surgeon: Thurmon Fair, MD;  Location: [facility] INVASIVE CV LAB;  Service: Cardiovascular;  Laterality: N/A;   POLYPECTOMY  01/03/2022   Procedure: POLYPECTOMY;  Surgeon: Beverley Fiedler, MD;  Location: [facility] ENDOSCOPY;  Service: Gastroenterology;;   RIGHT/LEFT HEART CATH AND CORONARY ANGIOGRAPHY N/A  12/15/2020   Procedure: RIGHT/LEFT HEART CATH AND CORONARY ANGIOGRAPHY;  Surgeon: Marykay Lex, MD;  Location: [facility] INVASIVE CV LAB;  Service: Cardiovascular;  Laterality: N/A;   TONSILLECTOMY      Current Medications: Current Meds  Medication Sig   acetaminophen (TYLENOL) 500 MG tablet Take 2 tablets (1,000 mg total) by mouth every 6 (six) hours as needed for fever or mild pain.   apixaban (ELIQUIS) 5 MG TABS tablet Take 1 tablet (5 mg total) by mouth 2 (two) times daily.   augmented betamethasone dipropionate (DIPROLENE-AF) 0.05 % cream Apply topically as needed.    bisoprolol (ZEBETA) 10 MG tablet TAKE 10 MG (ONE TABLET) IN THE MORNING AND 5 MG (HALF A TABLET) IN THE EVENING.   bisoprolol (ZEBETA) 5 MG tablet TAKE 1/2 TABLET BY MOUTH EVERY DAY   Calcium Carbonate-Vitamin D 600-400 MG-UNIT tablet Take 1 tablet by mouth 2 (two) times daily.    cetirizine (ZYRTEC) 10 MG tablet Take 10 mg by mouth every evening.    Coenzyme Q10 200 MG capsule Take 200 mg by mouth daily.   diltiazem (CARDIZEM) 30 MG tablet Take 1 tablet (30 mg total) by mouth 3 (three) times daily as needed (palpitations).   diphenhydrAMINE (BENADRYL) 25 MG tablet Take 12.5 mg by mouth at bedtime.   ferrous sulfate 325 (65 FE) MG EC tablet Take 325 mg by mouth at bedtime.   furosemide (LASIX) 20 MG tablet Take 1 tablet (20 mg total) by mouth daily as needed (swelling).   melatonin 5 MG TABS Take 2.5 mg by mouth at bedtime.   simvastatin (ZOCOR) 20 MG tablet Take 20 mg by mouth daily.   vitamin C (ASCORBIC ACID) 500 MG tablet Take 500 mg by mouth daily.     Allergies:   Cefzil [cefprozil]   Social History   Socioeconomic History   Marital status: Married    Spouse name: Not on file   Number of children: Not on file   Years of education: Not on file   Highest education level: Not on file  Occupational History   Not on file  Tobacco Use   Smoking status: Never   Smokeless tobacco: Never  Vaping Use   Vaping Use: Never used  Substance and Sexual Activity   Alcohol use: No    Alcohol/week: 0.0 standard drinks of alcohol   Drug use: No   Sexual activity: Not on file  Other Topics Concern   Not on file  Social History Narrative   Not on file   Social Determinants of Health   Financial Resource Strain: Not on file  Food Insecurity: Not on file  Transportation Needs: Not on file  Physical Activity: Not on file  Stress: Not on file  Social Connections: Not on file     Family History: The patient's family history includes Arthritis in an other family member; Heart disease in  an other family member; Osteoporosis in an other family member. There is no history of Colon cancer or Colon polyps.  ROS:   Please see the history of present illness.     All other systems reviewed and are negative.  EKGs/Labs/Other Studies Reviewed:    The following studies were reviewed today:  Cardiac Studies & Procedures   CARDIAC CATHETERIZATION  CARDIAC CATHETERIZATION 12/15/2020  Narrative   LV end diastolic pressure is severely elevated.   Hemodynamic findings consistent with mild pulmonary hypertension.  SUMMARY Angiographically Normal Coronary arteries with a left dominant system. Significant LVOT gradient with  apical LV pressures of 184/12 mmHg with an EDP of 25, mid LV cavity 157/9 mmHg with EDP of 25 and outflow tract pressure of 111/9 mmHg with a stable EDP of 24 mmHg. Mild Secondary Pulmonary Hypertension with mean PAP of 29 mmHg and PCWP of 29 mmHg. Moderately reduced cardiac function with a cardiac output-index of 3.26 and 2.02 by Fick and 3.55-2.2 by thermodilution.   RECOMMENDATIONS Gentle titration of medications to allow for diuresis without leading to systemic hypotension. Defer decision making to rounding cardiology service.    Bryan Lemma, MD  Findings Coronary Findings Diagnostic  Dominance: Left  Left Main Vessel was injected. Vessel is large. Vessel is angiographically normal.  Left Anterior Descending Vessel was injected. Vessel is normal in caliber. Large wraparound draping vessel Vessel is angiographically normal.  Third Diagonal Branch Vessel is small in size.  Ramus Intermedius Vessel was injected. Vessel is moderate in size. Vessel is angiographically normal.  Left Circumflex Vessel was injected. Vessel is normal in caliber and large. Vessel is angiographically normal.  First Obtuse Marginal Branch Vessel is small in size.  Left Posterior Descending Artery Vessel is moderate in size.  First Left Posterolateral  Branch Vessel is moderate in size.  Second Left Posterolateral Branch Vessel is large in size.  Left Posterior Atrioventricular Artery Vessel is large in size.  Right Coronary Artery Vessel was injected. Vessel is small. Vessel is angiographically normal.  Acute Marginal Branch Vessel was injected. Vessel is small in size. Vessel is angiographically normal.  Right Ventricular Branch Vessel was injected. Vessel is small in size. Vessel is angiographically normal.  Intervention  No interventions have been documented.   STRESS TESTS  NM MYOCAR MULTI W/SPECT W 07/25/2016  Narrative  Left bundle branch block present throughout.  No significant myocardial perfusion defects to indicate scar or ischemia.  This is a low risk study.  Nuclear stress EF: 71%.   ECHOCARDIOGRAM  ECHOCARDIOGRAM COMPLETE 07/16/2022  Narrative ECHOCARDIOGRAM REPORT    Patient Name:   KAILEIA LIEBLING Date of Exam: 07/16/2022 Medical Rec #:  [id number]     Height:       62.0 in Accession #:    [id number]    Weight:       140.0 lb Date of Birth:  1950/05/02    BSA:          1.643 m Patient Age:    44 years      BP:           126/74 mmHg Patient Gender: F             HR:           68 bpm. Exam Location:  [address]  Procedure: 2D Echo, Cardiac Doppler and Color Doppler  Indications:    I42.1 HOCM  History:        Patient has prior history of Echocardiogram examinations, most recent 12/15/2020. CAD, Pacemaker, Arrythmias:LBBB and Atrial Fibrillation; Risk Factors:Hypertension. Sick Sinus Syndrome.  Sonographer:    Sedonia Small Rodgers-Jones RDCS Referring Phys: [id number] MIHAI CROITORU  IMPRESSIONS   1. Left ventricular ejection fraction, by estimation, is 60 to 65%. The left ventricle has normal function. The left ventricle has no regional wall motion abnormalities. moderate-severe asymmetric left ventricular hypertrophy of the septal segment (15-16 mm). No resting LVOT obstruction. With Valsalva,  max instantaneous gradient 10 mmHg (1.42 m/s). No systolic anterior motion of the mitral valve at rest. Left ventricular diastolic parameters are consistent with Grade I diastolic dysfunction (impaired  relaxation). 2. Right ventricular systolic function is normal. The right ventricular size is normal. There is normal pulmonary artery systolic pressure. The estimated right ventricular systolic pressure is 23.2 mmHg. 3. The mitral valve is normal in structure. Trivial mitral valve regurgitation. No evidence of mitral stenosis. 4. The aortic valve is tricuspid. There is mild calcification of the aortic valve. There is mild thickening of the aortic valve. Aortic valve regurgitation is mild. No aortic stenosis is present. 5. The inferior vena cava is normal in size with greater than 50% respiratory variability, suggesting right atrial pressure of 3 mmHg.  FINDINGS Left Ventricle: Left ventricular ejection fraction, by estimation, is 60 to 65%. The left ventricle has normal function. The left ventricle has no regional wall motion abnormalities. The left ventricular internal cavity size was normal in size. Moderate-severe asymmetric left ventricular hypertrophy of the septal segment. Left ventricular diastolic parameters are consistent with Grade I diastolic dysfunction (impaired relaxation).  Right Ventricle: The right ventricular size is normal. No increase in right ventricular wall thickness. Right ventricular systolic function is normal. There is normal pulmonary artery systolic pressure. The tricuspid regurgitant velocity is 2.25 m/s, and with an assumed right atrial pressure of 3 mmHg, the estimated right ventricular systolic pressure is 23.2 mmHg.  Left Atrium: Left atrial size was normal in size.  Right Atrium: Right atrial size was normal in size.  Pericardium: There is no evidence of pericardial effusion.  Mitral Valve: The mitral valve is normal in structure. Trivial mitral valve  regurgitation. No evidence of mitral valve stenosis.  Tricuspid Valve: The tricuspid valve is normal in structure. Tricuspid valve regurgitation is mild . No evidence of tricuspid stenosis.  Aortic Valve: The aortic valve is tricuspid. There is mild calcification of the aortic valve. There is mild thickening of the aortic valve. Aortic valve regurgitation is mild. Aortic regurgitation PHT measures 737 msec. No aortic stenosis is present.  Pulmonic Valve: The pulmonic valve was normal in structure. Pulmonic valve regurgitation is mild. No evidence of pulmonic stenosis.  Aorta: The aortic root is normal in size and structure.  Venous: The inferior vena cava is normal in size with greater than 50% respiratory variability, suggesting right atrial pressure of 3 mmHg.  IAS/Shunts: No atrial level shunt detected by color flow Doppler.  Additional Comments: A device lead is visualized in the right atrium and right ventricle.   LEFT VENTRICLE PLAX 2D LVIDd:         3.50 cm   Diastology LVIDs:         2.20 cm   LV e' medial:    5.22 cm/s LV PW:         1.20 cm   LV E/e' medial:  13.2 LV IVS:        1.60 cm   LV e' lateral:   7.40 cm/s LVOT diam:     1.90 cm   LV E/e' lateral: 9.3 LV SV:         83 LV SV Index:   51 LVOT Area:     2.84 cm   RIGHT VENTRICLE             IVC RV Basal diam:  3.30 cm     IVC diam: 1.30 cm RV S prime:     12.05 cm/s TAPSE (M-mode): 1.8 cm  LEFT ATRIUM             Index        RIGHT ATRIUM  Index LA diam:        4.20 cm 2.56 cm/m   RA Area:     10.00 cm LA Vol (A2C):   59.0 ml 35.91 ml/m  RA Volume:   21.80 ml  13.27 ml/m LA Vol (A4C):   37.9 ml 23.07 ml/m LA Biplane Vol: 48.0 ml 29.22 ml/m AORTIC VALVE LVOT Vmax:   131.00 cm/s LVOT Vmean:  86.200 cm/s LVOT VTI:    0.294 m AI PHT:      737 msec  AORTA Ao Root diam: 2.90 cm Ao Asc diam:  3.10 cm  MITRAL VALVE               TRICUSPID VALVE MV Area (PHT): 2.60 cm    TR Peak grad:   20.2  mmHg MV Decel Time: 292 msec    TR Vmax:        225.00 cm/s MR Peak grad: 95.6 mmHg MR Vmax:      489.00 cm/s  SHUNTS MV E velocity: 68.65 cm/s  Systemic VTI:  0.29 m MV A velocity: 98.45 cm/s  Systemic Diam: 1.90 cm MV E/A ratio:  0.70  Weston Brass MD Electronically signed by Weston Brass MD Signature Date/Time: 07/16/2022/3:00:14 PM    Final    MONITORS  LONG TERM MONITOR (3-14 DAYS) 11/26/2019  Narrative  The dominant rhythm is mild sinus bradycardia and normal sinus rhythm with blunted circadian rhythm variation. The average heart rate is 55 bpm. The maximum heart rate during sinus rhythm is only 93 bpm representing 60% of maximum predicted heart rate for age  There are frequent PVCs, representing 6% of all QRS complexes. Most of these occur isolated at, with rare couplets or triplets. There is no evidence of ventricular tachycardia.  Rare brief episodes of nonsustained atrial tachycardia are seen, with a maximum of 17 consecutive beats. The morphology suggests ectopic atrial tachycardia.  Abnormal rhythm monitor with findings suggestive of sinus node dysfunction (persistent bradycardia and blunted chronotropic response).  Also seen are frequent isolated PVCs and rare brief nonsustained atrial tachycardia. Findings of sinus bradycardia and blunted response to exercise are present even prior to initiation of beta-blocker therapy.   CT SCANS  CT CORONARY MORPH W/CTA COR W/SCORE 12/03/2018  Addendum 12/03/2018  9:09 PM ADDENDUM REPORT: 12/03/2018 21:07  HISTORY: 72 yo female with chest pain  EXAM: Cardiac/Coronary CTA  TECHNIQUE: The patient was scanned on a [employer].  PROTOCOL: A 120 kV prospective scan was triggered in the descending thoracic aorta at 111 HU's. Axial non-contrast 3 mm slices were carried out through the heart. The data set was analyzed on a dedicated work station and scored using the Agatson method. Gantry rotation speed was 250  msecs and collimation was .6 mm. Beta blockade and 0.8 mg of sl NTG was given. The 3D data set was reconstructed in 5% intervals of the 67-82 % of the R-R cycle. Diastolic phases were analyzed on a dedicated work station using MPR, MIP and VRT modes. The patient received OMNIPAQUE IOHEXOL 350 MG/ML SOLN of contrast.  FINDINGS: Coronary calcium score: The patient's coronary artery calcium score is 149, which places the patient in the 85th percentile.  Coronary arteries: Normal coronary origins.  Left dominance  Right Coronary Artery: Small, non-dominant vessel which gives off a larger AV nodal branch. No obstructive disease.  Left Main Coronary Artery: Prominent left coronary cusp, but not anuerymsal. No stenosis. Gives off larger LAD and LCx arteries.  Left Anterior Descending Coronary  Artery: Calcified proximal vessel with mild 25-49% (CADRADS2) mixed stenosis. Gives off several small diagonal branches.  Ramus Intermedius Artery: Small branch without significant stenosis.  Left Circumflex Artery: Dominant vessel that gives off a small PDA artery. Larger caliber with minimal mixed 1-24% mid-vessel stenosis (CADRADS1).  Aorta: Normal size, 28 mm at the mid ascending aorta (level of the PA bifurcation) measured double oblique. Atherosclerosis. No dissection.  Aortic Valve: Trileaflet.  Calcification of the annulus.  Other findings:  Thickening of the aortic and mitral valve leaflets.  Normal biatrial size  Normal pulmonary vein drainage into the left atrium.  Normal left atrial appendage without a thrombus.  Normal size of the pulmonary artery.  IMPRESSION: 1. Mild, non-obstructive CAD primarily of the LAD, CADRADS = 2.  2. Coronary calcium score of 149. This was 85th percentile for age and sex matched control.  3. Normal coronary origin with left dominance.  4. Thickening of the aortic and mitral valve leaflets   Electronically Signed By: Chrystie Nose  M.D. On: 12/03/2018 21:07  Narrative EXAM: OVER-READ INTERPRETATION  CT CHEST  The following report is an over-read performed by radiologist Dr. Trudie Reed of [facility] on 12/01/2018. This over-read does not include interpretation of cardiac or coronary anatomy or pathology. The coronary calcium score/coronary CTA interpretation by the cardiologist is attached.  COMPARISON:  None.  FINDINGS: Aortic atherosclerosis. Within the visualized portions of the thorax there are no suspicious appearing pulmonary nodules or masses, there is no acute consolidative airspace disease, no pleural effusions, no pneumothorax and no lymphadenopathy. Visualized portions of the upper abdomen are unremarkable. There are no aggressive appearing lytic or blastic lesions noted in the visualized portions of the skeleton.  IMPRESSION: 1.  Aortic Atherosclerosis (ICD10-I70.0).  Electronically Signed: By: Trudie Reed M.D. On: 12/01/2018 12:34           Recent Labs: 01/07/2022: ALT 22; BUN 12; Creatinine 0.89; Hemoglobin 13.3; Platelet Count 196; Potassium 3.7; Sodium 142  Recent Lipid Panel No results found for: "CHOL", "TRIG", "HDL", "CHOLHDL", "VLDL", "LDLCALC", "LDLDIRECT"   Physical Exam:    VS:  BP 138/72   Pulse 73   Ht 5\' 2"  (1.575 m)   Wt 142 lb 6.4 oz (64.6 kg)   SpO2 96%   BMI 26.05 kg/m     Wt Readings from Last 3 Encounters:  08/27/22 142 lb 6.4 oz (64.6 kg)  06/13/22 140 lb (63.5 kg)  02/14/22 140 lb 6.4 oz (63.7 kg)     GEN:  Well nourished, well developed in no acute distress HEENT: Normal NECK: No JVD LYMPHATICS: No lymphadenopathy CARDIAC: RRR, no rubs, gallops, rare S4 on stading RESPIRATORY:  Clear to auscultation without rales, wheezing or rhonchi  ABDOMEN: Soft, non-tender, non-distended MUSCULOSKELETAL:  mild non pitting edema; No deformity  SKIN: Warm and dry NEUROLOGIC:  Alert and oriented x 3 PSYCHIATRIC:  Normal affect    ASSESSMENT:    1. HOCM (hypertrophic obstructive cardiomyopathy) (HCC)   2. Primary hypertension   3. Paroxysmal atrial fibrillation (HCC)    PLAN:    Hypertrophic Cardiomyopathy - non obstructive vs inducible gradient - without significant MR,  - suspicion of Fabry's/Danon or other mimics of HCM: low - Gene variant: NA  - NYHA II-III - Non HCM Contributors to disease/status: she has some signs and sx of HFpEF that may contribute to her sequelae  Family history reviewed, Discussed family screening  (Son need echo imaging; they have deferred genetic testing at this time)  SCD  Assessment - she has had no prior high risk  findings; we discussed her risks at this time. - if she has NSVT we will consider CMR vs ICD upgrade in consultation with Dr. Salena Saner - if not, when we upgrade our CMR protocols for devices, we may attempt CMR for risk stratification - at age 74 she is lower risk for scar related SCD by compressions  Atrial fibrillation  - CHASDAVSC NA; on DOAC  Medication symptom plan - We will continue her bisoprolol as she has needed it for BP control, rate control, and for potential LVOT gradient - if inducible gradient, we have reviewed CMI, ASA, and SRT - if no inducible gradient, we will attempt to treat HFpEF and discuss clinical trial options; in 2025 we may have more options  Fall f/u  Time Spent Directly with Patient:   I have spent a total of 60 with the patient reviewing notes, imaging, EKGs, labs and examining the patient as well as establishing an assessment and plan that was discussed personally with the patient.  > 50% of time was spent in direct patient care and family.      Medication Adjustments/Labs and Tests Ordered: Current medicines are reviewed at length with the patient today.  Concerns regarding medicines are outlined above.  Orders Placed This Encounter  Procedures   ECHOCARDIOGRAM STRESS TEST   Meds ordered this encounter  Medications    furosemide (LASIX) 20 MG tablet    Sig: Take 1 tablet (20 mg total) by mouth daily as needed (swelling).    Dispense:  30 tablet    Refill:  11    Patient Instructions  Medication Instructions:  Your physician has recommended you make the following change in your medication:  START: furosemide (Lasix) 20 mg by mouth once daily as needed for leg swelling  *If you need a refill on your cardiac medications before your next appointment, please call your pharmacy*   Lab Work: NONE  If you have labs (blood work) drawn today and your tests are completely normal, you will receive your results only by: MyChart Message (if you have MyChart) OR A paper copy in the mail If you have any lab test that is abnormal or we need to change your treatment, we will call you to review the results.   Testing/Procedures: Your physician has requested that you have a stress echocardiogram. For further information please visit [email or web address] Please follow instruction sheet as given.   Follow-Up: At [facility], you and your health needs are our priority.  As part of our continuing mission to provide you with exceptional heart care, we have created designated Provider Care Teams.  These Care Teams include your primary Cardiologist (physician) and Advanced Practice Providers (APPs -  Physician Assistants and Nurse Practitioners) who all work together to provide you with the care you need, when you need it.  We recommend signing up for the patient portal called "MyChart".  Sign up information is provided on this After Visit Summary.  MyChart is used to connect with patients for Virtual Visits (Telemedicine).  Patients are able to view lab/test results, encounter notes, upcoming appointments, etc.  Non-urgent messages can be sent to your provider as well.   To learn more about what you can do with MyChart, go to [email or web address].    Your next appointment:   5-6 month(s)  Provider:    Riley Lam, MD    Signed, Christell Constant, MD  08/27/2022 11:25 AM  [facility]

## 2022-09-03 ENCOUNTER — Ambulatory Visit (INDEPENDENT_AMBULATORY_CARE_PROVIDER_SITE_OTHER): Payer: Medicare PPO

## 2022-09-03 DIAGNOSIS — I495 Sick sinus syndrome: Secondary | ICD-10-CM

## 2022-09-03 LAB — CUP PACEART REMOTE DEVICE CHECK
Battery Remaining Longevity: 102 mo
Battery Remaining Percentage: 100 %
Brady Statistic RA Percent Paced: 94 %
Brady Statistic RV Percent Paced: 93 %
Date Time Interrogation Session: 20240603021100
Implantable Lead Connection Status: 753985
Implantable Lead Connection Status: 753985
Implantable Lead Implant Date: 20211129
Implantable Lead Implant Date: 20211129
Implantable Lead Location: 753859
Implantable Lead Location: 753860
Implantable Lead Model: 7840
Implantable Lead Model: 7841
Implantable Lead Serial Number: 1036898
Implantable Lead Serial Number: 1100104
Implantable Pulse Generator Implant Date: 20211129
Lead Channel Impedance Value: 708 Ohm
Lead Channel Impedance Value: 924 Ohm
Lead Channel Pacing Threshold Amplitude: 0.6 V
Lead Channel Pacing Threshold Amplitude: 0.7 V
Lead Channel Pacing Threshold Pulse Width: 0.4 ms
Lead Channel Pacing Threshold Pulse Width: 0.4 ms
Lead Channel Setting Pacing Amplitude: 3.5 V
Lead Channel Setting Pacing Amplitude: 3.5 V
Lead Channel Setting Pacing Pulse Width: 0.4 ms
Lead Channel Setting Sensing Sensitivity: 2.5 mV
Pulse Gen Serial Number: 951099
Zone Setting Status: 755011

## 2022-09-17 ENCOUNTER — Other Ambulatory Visit: Payer: Self-pay

## 2022-09-17 DIAGNOSIS — I421 Obstructive hypertrophic cardiomyopathy: Secondary | ICD-10-CM

## 2022-09-19 ENCOUNTER — Telehealth (HOSPITAL_COMMUNITY): Payer: Self-pay

## 2022-09-19 NOTE — Telephone Encounter (Signed)
Spoke with the patient, detailed instructions given. She stated that she would be here for her test. Asked to call back with any questions. Alexandria French EMTP/CCT 

## 2022-09-23 ENCOUNTER — Ambulatory Visit (HOSPITAL_COMMUNITY): Payer: Medicare PPO

## 2022-09-23 ENCOUNTER — Ambulatory Visit (HOSPITAL_COMMUNITY): Payer: Medicare PPO | Attending: Internal Medicine

## 2022-09-23 DIAGNOSIS — I421 Obstructive hypertrophic cardiomyopathy: Secondary | ICD-10-CM | POA: Diagnosis not present

## 2022-09-23 LAB — ECHOCARDIOGRAM STRESS TEST
Area-P 1/2: 2.57 cm2
P 1/2 time: 888 msec
S' Lateral: 2.3 cm

## 2022-09-26 NOTE — Progress Notes (Signed)
Remote pacemaker transmission.   

## 2022-10-08 ENCOUNTER — Telehealth: Payer: Self-pay

## 2022-10-08 DIAGNOSIS — I421 Obstructive hypertrophic cardiomyopathy: Secondary | ICD-10-CM

## 2022-10-08 DIAGNOSIS — I10 Essential (primary) hypertension: Secondary | ICD-10-CM

## 2022-10-08 MED ORDER — SPIRONOLACTONE 25 MG PO TABS: 12.5000 mg | ORAL_TABLET | Freq: Every day | ORAL | 3 refills | Status: DC

## 2022-10-08 NOTE — Telephone Encounter (Signed)
-----   Message from Christell Constant, MD sent at 10/03/2022  5:42 PM EDT ----- Results: Non obstructive HCM Plan: Trial of 12.5 mg PO spironolactone and BMP in two weeks  Christell Constant, MD

## 2022-10-08 NOTE — Telephone Encounter (Signed)
The patient has been notified of the result and verbalized understanding.  All questions (if any) were answered. Alexandria Right Himani Corona, RN 10/08/2022 4:07 PM    Pt will have labs drawn at [facility] in [place] in 2 weeks.  Advised pt does not need an appointment for [facility] in [place].

## 2022-11-07 DIAGNOSIS — Z131 Encounter for screening for diabetes mellitus: Secondary | ICD-10-CM | POA: Diagnosis not present

## 2022-11-07 DIAGNOSIS — E559 Vitamin D deficiency, unspecified: Secondary | ICD-10-CM | POA: Diagnosis not present

## 2022-11-07 DIAGNOSIS — Z13220 Encounter for screening for lipoid disorders: Secondary | ICD-10-CM | POA: Diagnosis not present

## 2022-11-07 DIAGNOSIS — Z1329 Encounter for screening for other suspected endocrine disorder: Secondary | ICD-10-CM | POA: Diagnosis not present

## 2022-11-07 DIAGNOSIS — E7801 Familial hypercholesterolemia: Secondary | ICD-10-CM | POA: Diagnosis not present

## 2022-11-07 DIAGNOSIS — Z0001 Encounter for general adult medical examination with abnormal findings: Secondary | ICD-10-CM | POA: Diagnosis not present

## 2022-11-14 DIAGNOSIS — D509 Iron deficiency anemia, unspecified: Secondary | ICD-10-CM | POA: Diagnosis not present

## 2022-11-14 DIAGNOSIS — N393 Stress incontinence (female) (male): Secondary | ICD-10-CM | POA: Diagnosis not present

## 2022-11-14 DIAGNOSIS — I447 Left bundle-branch block, unspecified: Secondary | ICD-10-CM | POA: Diagnosis not present

## 2022-11-14 DIAGNOSIS — G2581 Restless legs syndrome: Secondary | ICD-10-CM | POA: Diagnosis not present

## 2022-11-14 DIAGNOSIS — I421 Obstructive hypertrophic cardiomyopathy: Secondary | ICD-10-CM | POA: Diagnosis not present

## 2022-11-14 DIAGNOSIS — Z0001 Encounter for general adult medical examination with abnormal findings: Secondary | ICD-10-CM | POA: Diagnosis not present

## 2022-11-14 DIAGNOSIS — R5383 Other fatigue: Secondary | ICD-10-CM | POA: Diagnosis not present

## 2022-11-14 DIAGNOSIS — L03039 Cellulitis of unspecified toe: Secondary | ICD-10-CM | POA: Diagnosis not present

## 2022-11-14 DIAGNOSIS — I10 Essential (primary) hypertension: Secondary | ICD-10-CM | POA: Diagnosis not present

## 2022-11-15 ENCOUNTER — Other Ambulatory Visit: Payer: Self-pay | Admitting: Cardiovascular Disease

## 2022-12-12 DIAGNOSIS — Z1329 Encounter for screening for other suspected endocrine disorder: Secondary | ICD-10-CM | POA: Diagnosis not present

## 2022-12-27 ENCOUNTER — Other Ambulatory Visit: Payer: Self-pay | Admitting: Cardiovascular Disease

## 2022-12-27 DIAGNOSIS — R5383 Other fatigue: Secondary | ICD-10-CM | POA: Diagnosis not present

## 2022-12-27 DIAGNOSIS — Z23 Encounter for immunization: Secondary | ICD-10-CM | POA: Diagnosis not present

## 2022-12-27 DIAGNOSIS — N393 Stress incontinence (female) (male): Secondary | ICD-10-CM | POA: Diagnosis not present

## 2022-12-27 DIAGNOSIS — Z0001 Encounter for general adult medical examination with abnormal findings: Secondary | ICD-10-CM | POA: Diagnosis not present

## 2022-12-27 DIAGNOSIS — I447 Left bundle-branch block, unspecified: Secondary | ICD-10-CM | POA: Diagnosis not present

## 2022-12-27 DIAGNOSIS — Z6825 Body mass index (BMI) 25.0-25.9, adult: Secondary | ICD-10-CM | POA: Diagnosis not present

## 2022-12-27 DIAGNOSIS — I421 Obstructive hypertrophic cardiomyopathy: Secondary | ICD-10-CM | POA: Diagnosis not present

## 2022-12-27 DIAGNOSIS — G2581 Restless legs syndrome: Secondary | ICD-10-CM | POA: Diagnosis not present

## 2022-12-27 DIAGNOSIS — Z7901 Long term (current) use of anticoagulants: Secondary | ICD-10-CM

## 2022-12-27 DIAGNOSIS — G4700 Insomnia, unspecified: Secondary | ICD-10-CM | POA: Diagnosis not present

## 2022-12-27 DIAGNOSIS — I10 Essential (primary) hypertension: Secondary | ICD-10-CM | POA: Diagnosis not present

## 2022-12-27 DIAGNOSIS — D509 Iron deficiency anemia, unspecified: Secondary | ICD-10-CM | POA: Diagnosis not present

## 2022-12-27 NOTE — Telephone Encounter (Signed)
Eliquis 5mg  refill request received. Patient is 72 years old, weight-64.6kg, Crea-0.89 on 01/07/22, Diagnosis-Afib, and last seen by Dr. Izora Ribas on 08/27/22. Dose is appropriate based on dosing criteria. Will send in refill to requested pharmacy.

## 2022-12-31 DIAGNOSIS — N390 Urinary tract infection, site not specified: Secondary | ICD-10-CM | POA: Diagnosis not present

## 2022-12-31 DIAGNOSIS — R300 Dysuria: Secondary | ICD-10-CM | POA: Diagnosis not present

## 2022-12-31 DIAGNOSIS — Z6824 Body mass index (BMI) 24.0-24.9, adult: Secondary | ICD-10-CM | POA: Diagnosis not present

## 2022-12-31 DIAGNOSIS — R030 Elevated blood-pressure reading, without diagnosis of hypertension: Secondary | ICD-10-CM | POA: Diagnosis not present

## 2023-01-01 DIAGNOSIS — R413 Other amnesia: Secondary | ICD-10-CM | POA: Diagnosis not present

## 2023-01-02 ENCOUNTER — Ambulatory Visit (INDEPENDENT_AMBULATORY_CARE_PROVIDER_SITE_OTHER): Payer: Medicare PPO

## 2023-01-02 ENCOUNTER — Telehealth: Payer: Self-pay | Admitting: Cardiovascular Disease

## 2023-01-02 DIAGNOSIS — I421 Obstructive hypertrophic cardiomyopathy: Secondary | ICD-10-CM | POA: Diagnosis not present

## 2023-01-02 NOTE — Telephone Encounter (Signed)
Patient is requesting to speak with the device team. Please advise.

## 2023-01-02 NOTE — Telephone Encounter (Signed)
Pt got a letter stating she missed a remote transmission.  She is sending one now.  Advised would check back to ensure transmission received.  If received and no issues-no news is good news.  Advised would call back if transmission NOT received.

## 2023-01-03 LAB — CUP PACEART REMOTE DEVICE CHECK
Battery Remaining Longevity: 102 mo
Battery Remaining Percentage: 96 %
Brady Statistic RA Percent Paced: 96 %
Brady Statistic RV Percent Paced: 95 %
Date Time Interrogation Session: 20241003151200
Implantable Lead Connection Status: 753985
Implantable Lead Connection Status: 753985
Implantable Lead Implant Date: 20211129
Implantable Lead Implant Date: 20211129
Implantable Lead Location: 753859
Implantable Lead Location: 753860
Implantable Lead Model: 7840
Implantable Lead Model: 7841
Implantable Lead Serial Number: 1036898
Implantable Lead Serial Number: 1100104
Implantable Pulse Generator Implant Date: 20211129
Lead Channel Impedance Value: 702 Ohm
Lead Channel Impedance Value: 870 Ohm
Lead Channel Pacing Threshold Amplitude: 0.5 V
Lead Channel Pacing Threshold Amplitude: 0.8 V
Lead Channel Pacing Threshold Pulse Width: 0.4 ms
Lead Channel Pacing Threshold Pulse Width: 0.4 ms
Lead Channel Setting Pacing Amplitude: 3.5 V
Lead Channel Setting Pacing Amplitude: 3.5 V
Lead Channel Setting Pacing Pulse Width: 0.4 ms
Lead Channel Setting Sensing Sensitivity: 2.5 mV
Pulse Gen Serial Number: 951099
Zone Setting Status: 755011

## 2023-01-07 NOTE — Telephone Encounter (Signed)
Transmission received 01/02/2023.

## 2023-01-17 NOTE — Progress Notes (Signed)
Remote pacemaker transmission.   

## 2023-01-20 DIAGNOSIS — Z1231 Encounter for screening mammogram for malignant neoplasm of breast: Secondary | ICD-10-CM | POA: Diagnosis not present

## 2023-02-10 ENCOUNTER — Ambulatory Visit: Payer: Medicare PPO | Admitting: Internal Medicine

## 2023-02-20 ENCOUNTER — Ambulatory Visit: Payer: Medicare PPO | Attending: Internal Medicine | Admitting: Internal Medicine

## 2023-02-20 ENCOUNTER — Encounter: Payer: Self-pay | Admitting: Internal Medicine

## 2023-02-20 VITALS — BP 128/80 | HR 93 | Ht 62.0 in | Wt 132.8 lb

## 2023-02-20 DIAGNOSIS — I422 Other hypertrophic cardiomyopathy: Secondary | ICD-10-CM

## 2023-02-20 DIAGNOSIS — I421 Obstructive hypertrophic cardiomyopathy: Secondary | ICD-10-CM | POA: Diagnosis not present

## 2023-02-20 DIAGNOSIS — I447 Left bundle-branch block, unspecified: Secondary | ICD-10-CM

## 2023-02-20 DIAGNOSIS — I495 Sick sinus syndrome: Secondary | ICD-10-CM

## 2023-02-20 DIAGNOSIS — I5033 Acute on chronic diastolic (congestive) heart failure: Secondary | ICD-10-CM

## 2023-02-20 MED ORDER — EMPAGLIFLOZIN 10 MG PO TABS: 10.0000 mg | ORAL_TABLET | Freq: Every day | ORAL | 3 refills | Status: DC

## 2023-02-20 MED ORDER — EMPAGLIFLOZIN 10 MG PO TABS: 10.0000 mg | ORAL_TABLET | Freq: Every day | ORAL | 0 refills | Status: AC

## 2023-02-20 NOTE — Progress Notes (Signed)
Cardiology Office Note:    Date:  02/20/2023   ID:  Alexandria French, DOB 31-Oct-1950, MRN [id number]  PCP:  Donetta Potts, MD   [facility] Providers Cardiologist:  Thurmon Fair, MD     Referring MD: Donetta Potts, MD   CC: HCM follow up  History of Present Illness:    Alexandria French is a 72 y.o. female with a hx of oHCM< limited in beta blockade due to bradycardiac causing pacing, with SSS s/p PPM with no significant NSVT, HTN, PAF, and non obstructive CAD and HLD. 2024: Seen by Dr. Royann Shivers, symptomatic despite no resting gradient.  No inducible gradient  Alexandria French, a 72 year old individual with a history of nonobstructive hypertrophic cardiomyopathy, presents with significant symptoms. She reports a decrease in energy levels and increased shortness of breath, particularly when engaging in physical activities such as yard work. She also mentions experiencing swelling in her ankles. Despite these symptoms, she denies experiencing chest pains or palpitations.  She has been on a long-term medication regimen, including Zebeta (bisoprolol) , which she believes may be contributing to her fatigue. She also reports a recent urinary tract infection, her first in a long time.  In the past, she underwent a stress test, but she does not recall reaching a point of exhaustion during the test. She has also been informed of having fast and slow heart rates in the past. Despite these health challenges, she tries to maintain an active lifestyle, albeit at a reduced pace compared to her previous levels of activity.  She has a family history of hypertrophic cardiomyopathy, with her son potentially at risk. However, she has not pursued genetic testing due to concerns about cost and potential implications for her son.  Overall, her primary concerns are her decreased energy levels, shortness of breath, and ankle swelling, which she attributes to her nonobstructive hypertrophic  cardiomyopathy and current medication regimen.   Past Medical History:  Diagnosis Date   A-fib [facility])    CAD (coronary artery disease)    Colon adenocarcinoma (HCC)    resected no other treatment   Complication of anesthesia    sometimes BP flucuates   Diverticulosis    Esophageal reflux    no meds   External hemorrhoids    Gastritis    Hypertension    IDA (iron deficiency anemia)    Internal hemorrhoids    Other and unspecified hyperlipidemia    Presence of permanent cardiac pacemaker 2021   bost sci   SSS (sick sinus syndrome) [facility])     Past Surgical History:  Procedure Laterality Date   ABDOMINAL HYSTERECTOMY  08/2008   BIOPSY  12/20/2020   Procedure: BIOPSY;  Surgeon: Iva Boop, MD;  Location: [facility] ENDOSCOPY;  Service: Endoscopy;;   BIOPSY  01/03/2022   Procedure: BIOPSY;  Surgeon: Beverley Fiedler, MD;  Location: [facility] ENDOSCOPY;  Service: Gastroenterology;;   BLADDER REPAIR     COLONOSCOPY WITH PROPOFOL N/A 12/20/2020   Procedure: COLONOSCOPY WITH PROPOFOL;  Surgeon: Iva Boop, MD;  Location: [facility] ENDOSCOPY;  Service: Endoscopy;  Laterality: N/A;   COLONOSCOPY WITH PROPOFOL N/A 01/03/2022   Procedure: COLONOSCOPY WITH PROPOFOL;  Surgeon: Beverley Fiedler, MD;  Location: [facility] ENDOSCOPY;  Service: Gastroenterology;  Laterality: N/A;   HEMOSTASIS CLIP PLACEMENT  01/03/2022   Procedure: HEMOSTASIS CLIP PLACEMENT;  Surgeon: Beverley Fiedler, MD;  Location: [facility] ENDOSCOPY;  Service: Gastroenterology;;   LAPAROSCOPIC PARTIAL COLECTOMY N/A 12/22/2020   Procedure: LAPAROSCOPIC ASSISTED PARTIAL COLECTOMY;  Surgeon: Abigail Miyamoto, MD;  Location: [facility] OR;  Service: General;  Laterality: N/A;   PACEMAKER IMPLANT N/A 02/28/2020   Procedure: PACEMAKER IMPLANT;  Surgeon: Thurmon Fair, MD;  Location: [facility] INVASIVE CV LAB;  Service: Cardiovascular;  Laterality: N/A;   POLYPECTOMY  01/03/2022   Procedure: POLYPECTOMY;  Surgeon: Beverley Fiedler, MD;  Location: [facility] ENDOSCOPY;  Service:  Gastroenterology;;   RIGHT/LEFT HEART CATH AND CORONARY ANGIOGRAPHY N/A 12/15/2020   Procedure: RIGHT/LEFT HEART CATH AND CORONARY ANGIOGRAPHY;  Surgeon: Marykay Lex, MD;  Location: [facility] INVASIVE CV LAB;  Service: Cardiovascular;  Laterality: N/A;   TONSILLECTOMY      Current Medications: Current Meds  Medication Sig   acetaminophen (TYLENOL) 500 MG tablet Take 2 tablets (1,000 mg total) by mouth every 6 (six) hours as needed for fever or mild pain.   augmented betamethasone dipropionate (DIPROLENE-AF) 0.05 % cream Apply topically as needed.   bisoprolol (ZEBETA) 10 MG tablet TAKE 10 MG (ONE TABLET) IN THE MORNING AND 5 MG (HALF A TABLET) IN THE EVENING.   bisoprolol (ZEBETA) 5 MG tablet TAKE 1/2 TABLET BY MOUTH EVERY DAY   Calcium Carbonate-Vitamin D 600-400 MG-UNIT tablet Take 1 tablet by mouth 2 (two) times daily.    cetirizine (ZYRTEC) 10 MG tablet Take 10 mg by mouth every evening.    Coenzyme Q10 200 MG capsule Take 200 mg by mouth daily.   diltiazem (CARDIZEM) 30 MG tablet Take 1 tablet (30 mg total) by mouth 3 (three) times daily as needed (palpitations).   diphenhydrAMINE (BENADRYL) 25 MG tablet Take 12.5 mg by mouth at bedtime.   ELIQUIS 5 MG TABS tablet TAKE 1 TABLET BY MOUTH TWICE A DAY   empagliflozin (JARDIANCE) 10 MG TABS tablet Take 1 tablet (10 mg total) by mouth daily before breakfast.   empagliflozin (JARDIANCE) 10 MG TABS tablet Take 1 tablet (10 mg total) by mouth daily before breakfast.   ferrous sulfate 325 (65 FE) MG EC tablet Take 325 mg by mouth at bedtime.   furosemide (LASIX) 20 MG tablet Take 1 tablet (20 mg total) by mouth daily as needed (swelling).   melatonin 5 MG TABS Take 2.5 mg by mouth at bedtime.   simvastatin (ZOCOR) 20 MG tablet Take 20 mg by mouth daily.   spironolactone (ALDACTONE) 25 MG tablet Take 0.5 tablets (12.5 mg total) by mouth daily. Take half a pill once daily   vitamin C (ASCORBIC ACID) 500 MG tablet Take 500 mg by mouth daily.      Allergies:   Cefzil [cefprozil]   Social History   Socioeconomic History   Marital status: Married    Spouse name: Not on file   Number of children: Not on file   Years of education: Not on file   Highest education level: Not on file  Occupational History   Not on file  Tobacco Use   Smoking status: Never   Smokeless tobacco: Never  Vaping Use   Vaping status: Never Used  Substance and Sexual Activity   Alcohol use: No    Alcohol/week: 0.0 standard drinks of alcohol   Drug use: No   Sexual activity: Not on file  Other Topics Concern   Not on file  Social History Narrative   Not on file   Social Determinants of Health   Financial Resource Strain: Not on file  Food Insecurity: Not on file  Transportation Needs: Not on file  Physical Activity: Not on file  Stress: Not on file  Social Connections: Not on file     Family History: The patient's family history includes Arthritis in an other family member; Heart disease in an other family member; Osteoporosis in an other family member. There is no history of Colon cancer or Colon polyps.  ROS:   Please see the history of present illness.     All other systems reviewed and are negative.  EKGs/Labs/Other Studies Reviewed:    The following studies were reviewed today:  Cardiac Studies & Procedures   CARDIAC CATHETERIZATION  CARDIAC CATHETERIZATION 12/15/2020  Narrative   LV end diastolic pressure is severely elevated.   Hemodynamic findings consistent with mild pulmonary hypertension.  SUMMARY Angiographically Normal Coronary arteries with a left dominant system. Significant LVOT gradient with apical LV pressures of 184/12 mmHg with an EDP of 25, mid LV cavity 157/9 mmHg with EDP of 25 and outflow tract pressure of 111/9 mmHg with a stable EDP of 24 mmHg. Mild Secondary Pulmonary Hypertension with mean PAP of 29 mmHg and PCWP of 29 mmHg. Moderately reduced cardiac function with a cardiac output-index of 3.26 and  2.02 by Fick and 3.55-2.2 by thermodilution.   RECOMMENDATIONS Gentle titration of medications to allow for diuresis without leading to systemic hypotension. Defer decision making to rounding cardiology service.    Bryan Lemma, MD  Findings Coronary Findings Diagnostic  Dominance: Left  Left Main Vessel was injected. Vessel is large. Vessel is angiographically normal.  Left Anterior Descending Vessel was injected. Vessel is normal in caliber. Large wraparound draping vessel Vessel is angiographically normal.  Third Diagonal Branch Vessel is small in size.  Ramus Intermedius Vessel was injected. Vessel is moderate in size. Vessel is angiographically normal.  Left Circumflex Vessel was injected. Vessel is normal in caliber and large. Vessel is angiographically normal.  First Obtuse Marginal Branch Vessel is small in size.  Left Posterior Descending Artery Vessel is moderate in size.  First Left Posterolateral Branch Vessel is moderate in size.  Second Left Posterolateral Branch Vessel is large in size.  Left Posterior Atrioventricular Artery Vessel is large in size.  Right Coronary Artery Vessel was injected. Vessel is small. Vessel is angiographically normal.  Acute Marginal Branch Vessel was injected. Vessel is small in size. Vessel is angiographically normal.  Right Ventricular Branch Vessel was injected. Vessel is small in size. Vessel is angiographically normal.  Intervention  No interventions have been documented.   STRESS TESTS  ECHOCARDIOGRAM STRESS TEST 09/23/2022  Narrative EXERCISE STRESS ECHO REPORT   --------------------------------------------------------------------------------  Patient Name:   EVALETTE COULTON Date of Exam: 09/23/2022 Medical Rec #:  [id number]     Height:       62.0 in Accession #:    [id number]    Weight:       142.4 lb Date of Birth:  01/12/1951    BSA:          1.655 m Patient Age:    83 years      BP:            150/89 mmHg Patient Gender: F             HR:           57 bpm. Exam Location:  [address]  Procedure: Stress Echo, Limited Color Doppler and Cardiac Doppler  Indications:    HOCM I42.1  History:        Patient has prior history of Echocardiogram examinations, most recent 07/16/2022.  Sonographer:    Samule Ohm RDCS Referring  Phys: [id number] [facility] Izora Ribas   Sonographer Comments: [facility] DPR SIGNED ON 10/23/20. OK TO SPEAK TO HUSBAND KARL Stapel AND SON DANNY Girtman IMPRESSIONS   1. Fair exercise capacity, achieved 6.6 METS. 2. Did not reach target HR. Peak HR 111 bpm (73% max age predicted HR). 3. Normal BP response to exercise. 4. Stress EKG not interpretable due to V-paced rhythm. 5. This is an inconclusive stress echocardiogram for ischemia. Non-diagnostic study for ischemia due to inadequate heart rate response. 6. This is an indeterminate risk study. 7. No resting LVOT gradient. Peak gradient during stress measured (measured with CW Doppler, unclear if location of obstruction was LVOT or mid cavitary)  FINDINGS  Exam Protocol: The patient exercised on a treadmill according to a Bruce protocol.   Patient Performance: The patient exercised for 5 minutes and 45 seconds, achieving 6.6 METS. The maximum stage achieved was II of the Bruce protocol. The baseline heart rate was 65 bpm. The heart rate at peak stress was 110 bpm. The target heart rate was calculated to be 128 bpm. The percentage of maximum predicted heart rate achieved was 73.2 %. The baseline blood pressure was 150/89 mmHg. The blood pressure at peak stress was 176/61 mmHg. The patient developed fatigue and shortness of breath during the stress exam.  EKG: Resting EKG showed Paced. The patient developed AV paced and baseline abnormalities during exercise. The stress EKG was not interpretable due to baseline abnormalities.   2D Echo Findings: The baseline ejection fraction was 60%. Baseline regional  wall motion abnormalities were not present. This is an inconclusive stress echocardiogram for ischemia. This is a non-diagnostic study for ischemia due to inadequate heart rate response.  Additional Findings: Fair exercise capacity, achieved 6.6 METS. Did not reach target HR. Peak HR 111 bpm (73% max age predicted HR). Normal BP response to exercise. Stress EKG not interpretable due to V-paced rhythm.   Epifanio Lesches MD Electronically signed on 09/23/2022 at 9:35:09 PM     Final   ECHOCARDIOGRAM  ECHOCARDIOGRAM COMPLETE 07/16/2022  Narrative ECHOCARDIOGRAM REPORT    Patient Name:   RYLA STOVALL Date of Exam: 07/16/2022 Medical Rec #:  [id number]     Height:       62.0 in Accession #:    [id number]    Weight:       140.0 lb Date of Birth:  Aug 05, 1950    BSA:          1.643 m Patient Age:    87 years      BP:           126/74 mmHg Patient Gender: F             HR:           68 bpm. Exam Location:  [address]  Procedure: 2D Echo, Cardiac Doppler and Color Doppler  Indications:    I42.1 HOCM  History:        Patient has prior history of Echocardiogram examinations, most recent 12/15/2020. CAD, Pacemaker, Arrythmias:LBBB and Atrial Fibrillation; Risk Factors:Hypertension. Sick Sinus Syndrome.  Sonographer:    Sedonia Small Rodgers-Jones RDCS Referring Phys: [id number] MIHAI CROITORU  IMPRESSIONS   1. Left ventricular ejection fraction, by estimation, is 60 to 65%. The left ventricle has normal function. The left ventricle has no regional wall motion abnormalities. moderate-severe asymmetric left ventricular hypertrophy of the septal segment (15-16 mm). No resting LVOT obstruction. With Valsalva, max instantaneous gradient 10 mmHg (1.42 m/s). No systolic anterior motion of the  mitral valve at rest. Left ventricular diastolic parameters are consistent with Grade I diastolic dysfunction (impaired relaxation). 2. Right ventricular systolic function is normal. The right ventricular  size is normal. There is normal pulmonary artery systolic pressure. The estimated right ventricular systolic pressure is 23.2 mmHg. 3. The mitral valve is normal in structure. Trivial mitral valve regurgitation. No evidence of mitral stenosis. 4. The aortic valve is tricuspid. There is mild calcification of the aortic valve. There is mild thickening of the aortic valve. Aortic valve regurgitation is mild. No aortic stenosis is present. 5. The inferior vena cava is normal in size with greater than 50% respiratory variability, suggesting right atrial pressure of 3 mmHg.  FINDINGS Left Ventricle: Left ventricular ejection fraction, by estimation, is 60 to 65%. The left ventricle has normal function. The left ventricle has no regional wall motion abnormalities. The left ventricular internal cavity size was normal in size. Moderate-severe asymmetric left ventricular hypertrophy of the septal segment. Left ventricular diastolic parameters are consistent with Grade I diastolic dysfunction (impaired relaxation).  Right Ventricle: The right ventricular size is normal. No increase in right ventricular wall thickness. Right ventricular systolic function is normal. There is normal pulmonary artery systolic pressure. The tricuspid regurgitant velocity is 2.25 m/s, and with an assumed right atrial pressure of 3 mmHg, the estimated right ventricular systolic pressure is 23.2 mmHg.  Left Atrium: Left atrial size was normal in size.  Right Atrium: Right atrial size was normal in size.  Pericardium: There is no evidence of pericardial effusion.  Mitral Valve: The mitral valve is normal in structure. Trivial mitral valve regurgitation. No evidence of mitral valve stenosis.  Tricuspid Valve: The tricuspid valve is normal in structure. Tricuspid valve regurgitation is mild . No evidence of tricuspid stenosis.  Aortic Valve: The aortic valve is tricuspid. There is mild calcification of the aortic valve. There is  mild thickening of the aortic valve. Aortic valve regurgitation is mild. Aortic regurgitation PHT measures 737 msec. No aortic stenosis is present.  Pulmonic Valve: The pulmonic valve was normal in structure. Pulmonic valve regurgitation is mild. No evidence of pulmonic stenosis.  Aorta: The aortic root is normal in size and structure.  Venous: The inferior vena cava is normal in size with greater than 50% respiratory variability, suggesting right atrial pressure of 3 mmHg.  IAS/Shunts: No atrial level shunt detected by color flow Doppler.  Additional Comments: A device lead is visualized in the right atrium and right ventricle.   LEFT VENTRICLE PLAX 2D LVIDd:         3.50 cm   Diastology LVIDs:         2.20 cm   LV e' medial:    5.22 cm/s LV PW:         1.20 cm   LV E/e' medial:  13.2 LV IVS:        1.60 cm   LV e' lateral:   7.40 cm/s LVOT diam:     1.90 cm   LV E/e' lateral: 9.3 LV SV:         83 LV SV Index:   51 LVOT Area:     2.84 cm   RIGHT VENTRICLE             IVC RV Basal diam:  3.30 cm     IVC diam: 1.30 cm RV S prime:     12.05 cm/s TAPSE (M-mode): 1.8 cm  LEFT ATRIUM  Index        RIGHT ATRIUM           Index LA diam:        4.20 cm 2.56 cm/m   RA Area:     10.00 cm LA Vol (A2C):   59.0 ml 35.91 ml/m  RA Volume:   21.80 ml  13.27 ml/m LA Vol (A4C):   37.9 ml 23.07 ml/m LA Biplane Vol: 48.0 ml 29.22 ml/m AORTIC VALVE LVOT Vmax:   131.00 cm/s LVOT Vmean:  86.200 cm/s LVOT VTI:    0.294 m AI PHT:      737 msec  AORTA Ao Root diam: 2.90 cm Ao Asc diam:  3.10 cm  MITRAL VALVE               TRICUSPID VALVE MV Area (PHT): 2.60 cm    TR Peak grad:   20.2 mmHg MV Decel Time: 292 msec    TR Vmax:        225.00 cm/s MR Peak grad: 95.6 mmHg MR Vmax:      489.00 cm/s  SHUNTS MV E velocity: 68.65 cm/s  Systemic VTI:  0.29 m MV A velocity: 98.45 cm/s  Systemic Diam: 1.90 cm MV E/A ratio:  0.70  Weston Brass MD Electronically signed by Weston Brass MD Signature Date/Time: 07/16/2022/3:00:14 PM    Final    MONITORS  LONG TERM MONITOR (3-14 DAYS) 11/25/2019  Narrative  The dominant rhythm is mild sinus bradycardia and normal sinus rhythm with blunted circadian rhythm variation. The average heart rate is 55 bpm. The maximum heart rate during sinus rhythm is only 93 bpm representing 60% of maximum predicted heart rate for age  There are frequent PVCs, representing 6% of all QRS complexes. Most of these occur isolated at, with rare couplets or triplets. There is no evidence of ventricular tachycardia.  Rare brief episodes of nonsustained atrial tachycardia are seen, with a maximum of 17 consecutive beats. The morphology suggests ectopic atrial tachycardia.  Abnormal rhythm monitor with findings suggestive of sinus node dysfunction (persistent bradycardia and blunted chronotropic response).  Also seen are frequent isolated PVCs and rare brief nonsustained atrial tachycardia. Findings of sinus bradycardia and blunted response to exercise are present even prior to initiation of beta-blocker therapy.   CT SCANS  CT CORONARY MORPH W/CTA COR W/SCORE 12/01/2018  Addendum 12/03/2018  9:09 PM ADDENDUM REPORT: 12/03/2018 21:07  HISTORY: 72 yo female with chest pain  EXAM: Cardiac/Coronary CTA  TECHNIQUE: The patient was scanned on a [employer].  PROTOCOL: A 120 kV prospective scan was triggered in the descending thoracic aorta at 111 HU's. Axial non-contrast 3 mm slices were carried out through the heart. The data set was analyzed on a dedicated work station and scored using the Agatson method. Gantry rotation speed was 250 msecs and collimation was .6 mm. Beta blockade and 0.8 mg of sl NTG was given. The 3D data set was reconstructed in 5% intervals of the 67-82 % of the R-R cycle. Diastolic phases were analyzed on a dedicated work station using MPR, MIP and VRT modes. The patient received OMNIPAQUE IOHEXOL  350 MG/ML SOLN of contrast.  FINDINGS: Coronary calcium score: The patient's coronary artery calcium score is 149, which places the patient in the 85th percentile.  Coronary arteries: Normal coronary origins.  Left dominance  Right Coronary Artery: Small, non-dominant vessel which gives off a larger AV nodal branch. No obstructive disease.  Left Main Coronary Artery: Prominent  left coronary cusp, but not anuerymsal. No stenosis. Gives off larger LAD and LCx arteries.  Left Anterior Descending Coronary Artery: Calcified proximal vessel with mild 25-49% (CADRADS2) mixed stenosis. Gives off several small diagonal branches.  Ramus Intermedius Artery: Small branch without significant stenosis.  Left Circumflex Artery: Dominant vessel that gives off a small PDA artery. Larger caliber with minimal mixed 1-24% mid-vessel stenosis (CADRADS1).  Aorta: Normal size, 28 mm at the mid ascending aorta (level of the PA bifurcation) measured double oblique. Atherosclerosis. No dissection.  Aortic Valve: Trileaflet.  Calcification of the annulus.  Other findings:  Thickening of the aortic and mitral valve leaflets.  Normal biatrial size  Normal pulmonary vein drainage into the left atrium.  Normal left atrial appendage without a thrombus.  Normal size of the pulmonary artery.  IMPRESSION: 1. Mild, non-obstructive CAD primarily of the LAD, CADRADS = 2.  2. Coronary calcium score of 149. This was 85th percentile for age and sex matched control.  3. Normal coronary origin with left dominance.  4. Thickening of the aortic and mitral valve leaflets   Electronically Signed By: Chrystie Nose M.D. On: 12/03/2018 21:07  Narrative EXAM: OVER-READ INTERPRETATION  CT CHEST  The following report is an over-read performed by radiologist Dr. Trudie Reed of [facility] on 12/01/2018. This over-read does not include interpretation of cardiac or coronary anatomy or  pathology. The coronary calcium score/coronary CTA interpretation by the cardiologist is attached.  COMPARISON:  None.  FINDINGS: Aortic atherosclerosis. Within the visualized portions of the thorax there are no suspicious appearing pulmonary nodules or masses, there is no acute consolidative airspace disease, no pleural effusions, no pneumothorax and no lymphadenopathy. Visualized portions of the upper abdomen are unremarkable. There are no aggressive appearing lytic or blastic lesions noted in the visualized portions of the skeleton.  IMPRESSION: 1.  Aortic Atherosclerosis (ICD10-I70.0).  Electronically Signed: By: Trudie Reed M.D. On: 12/01/2018 12:34           Recent Labs: No results found for requested labs within last 365 days.  Recent Lipid Panel No results found for: "CHOL", "TRIG", "HDL", "CHOLHDL", "VLDL", "LDLCALC", "LDLDIRECT"   Physical Exam:    VS:  BP 128/80   Pulse 93   Ht 5\' 2"  (1.575 m)   Wt 132 lb 12.8 oz (60.2 kg)   SpO2 99%   BMI 24.29 kg/m     Wt Readings from Last 3 Encounters:  02/20/23 132 lb 12.8 oz (60.2 kg)  08/27/22 142 lb 6.4 oz (64.6 kg)  06/13/22 140 lb (63.5 kg)     GEN:  Well nourished, well developed in no acute distress HEENT: Normal NECK: No JVD CARDIAC: RRR, no rubs, gallops, systolic murmur with handgrip RESPIRATORY:  Clear to auscultation without rales, wheezing or rhonchi  ABDOMEN: Soft, non-tender, non-distended MUSCULOSKELETAL:  non pitting  edema; No deformity  SKIN: Warm and dry NEUROLOGIC:  Alert and oriented x 3 PSYCHIATRIC:  Normal affect   ASSESSMENT:    1. HOCM (hypertrophic obstructive cardiomyopathy) (HCC)    PLAN:    Hypertrophic Cardiomyopathy - non obstructive  - without significant MR, - suspicion of Fabry's/Danon or other mimics of HCM: low - Gene variant: NA  - NYHA II-III - Non HCM Contributors to disease/status: she has some signs and sx of HFpEF that may contribute to her  sequelae Hypertrophic Nonobstructive Cardiomyopathy   - Significant symptoms of fatigue and shortness of breath. Stress echo confirmed non-obstructive status. Current medications include bisoprolol and  metoprolol, potentially contributing to fatigue. Discussed new FDA-approved medications for nonobstructive hypertrophic cardiomyopathy in fall 2025. Genetic testing for hypertrophic cardiomyopathy could benefit her son, available for free through a gene therapy company, but current data does not support outcome changes for the patient. Discussed pros and cons of genetic testing, including potential impact on son's life insurance and need for regular echocardiograms if not pursued.   - Start Jardiance 10 mg   - Order basic metabolic panel in two weeks in [place], [place]   - Consider reducing bisoprolol if symptoms persist; if her fatigue improves but her DOE worsens, we will then re-image her - as a last resort, we could consider norpace; we discussed SE profile   - Discuss genetic testing with the patient as it relates to her son; she will discuss this with him and send me a message if they wish to pursue free genetic testing - Send a courtesy message to Dr. 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 Plan to address fluid retention and monitor kidney function. Discussed risk of urinary tract infections with Jardiance and need to stop medication if infections recur. If symptoms persist, consider reducing heart rate medications with close monitoring and potential re-imaging.   - Start Jardiance 10 mg   - Order basic metabolic panel in two weeks in [place], [place]   - Monitor for urinary tract infections    Follow-up   - Follow up in fall 2025   - pending Dr. Salena Saner f/u as well, she is AV dual paced and narrow  Time Spent Directly with Patient:   I have spent a total of 51 minutes with the patient reviewing  notes, imaging, EKGs, labs, examining the patient as well as establishing an assessment and plan that was discussed personally with the patient. Discussed disease state education as it related to genetic testing, GINA considerations but also to potential research implications and impact on son      Medication Adjustments/Labs and Tests Ordered: Current medicines are reviewed at length with the patient today.  Concerns regarding medicines are outlined above.  Orders Placed This Encounter  Procedures   Basic Metabolic Panel (BMET)   EKG 12-Lead   Meds ordered this encounter  Medications   empagliflozin (JARDIANCE) 10 MG TABS tablet    Sig: Take 1 tablet (10 mg total) by mouth daily before breakfast.    Dispense:  90 tablet    Refill:  3   empagliflozin (JARDIANCE) 10 MG TABS tablet    Sig: Take 1 tablet (10 mg total) by mouth daily before breakfast.    Dispense:  28 tablet    Refill:  0    Order Specific Question:   Lot Number?    Answer:   [id number]    Order Specific Question:   Expiration Date?    Answer:   02/25/2025    Patient Instructions  Medication Instructions:  Your physician has recommended you make the following change in your medication:  1-Jardiance 10 mg by mouth daily.  *If you need a refill on your cardiac medications before your next appointment, please call your pharmacy*  Lab Work: Your physician recommends that you return for lab work in: 2 weeks for BMET in [place]  If you have labs (blood work) drawn today and your tests are completely normal, you will receive your results only by: MyChart Message (if you have MyChart) OR A  paper copy in the mail If you have any lab test that is abnormal or we need to change your treatment, we will call you to review the results.  Testing/Procedures: None ordered today.  Follow-Up: At [facility], you and your health needs are our priority.  As part of our continuing mission to provide you with exceptional heart  care, we have created designated Provider Care Teams.  These Care Teams include your primary Cardiologist (physician) and Advanced Practice Providers (APPs -  Physician Assistants and Nurse Practitioners) who all work together to provide you with the care you need, when you need it.  We recommend signing up for the patient portal called "MyChart".  Sign up information is provided on this After Visit Summary.  MyChart is used to connect with patients for Virtual Visits (Telemedicine).  Patients are able to view lab/test results, encounter notes, upcoming appointments, etc.  Non-urgent messages can be sent to your provider as well.   To learn more about what you can do with MyChart, go to [email or web address].    Your next appointment:   10 month(s)  Provider:   Dr. Izora Ribas    Your physician recommends that you schedule a follow-up appointment in: March with Dr. Royann Shivers.     Signed, Christell Constant, MD  02/20/2023 12:46 PM    [facility]

## 2023-02-20 NOTE — Patient Instructions (Addendum)
Medication Instructions:  Your physician has recommended you make the following change in your medication:  1-Jardiance 10 mg by mouth daily.  *If you need a refill on your cardiac medications before your next appointment, please call your pharmacy*  Lab Work: Your physician recommends that you return for lab work in: 2 weeks for BMET in [place]  If you have labs (blood work) drawn today and your tests are completely normal, you will receive your results only by: [employer] (if you have MyChart) OR A paper copy in the mail If you have any lab test that is abnormal or we need to change your treatment, we will call you to review the results.  Testing/Procedures: None ordered today.  Follow-Up: At [facility], you and your health needs are our priority.  As part of our continuing mission to provide you with exceptional heart care, we have created designated Provider Care Teams.  These Care Teams include your primary Cardiologist (physician) and Advanced Practice Providers (APPs -  Physician Assistants and Nurse Practitioners) who all work together to provide you with the care you need, when you need it.  We recommend signing up for the patient portal called "MyChart".  Sign up information is provided on this After Visit Summary.  MyChart is used to connect with patients for Virtual Visits (Telemedicine).  Patients are able to view lab/test results, encounter notes, upcoming appointments, etc.  Non-urgent messages can be sent to your provider as well.   To learn more about what you can do with MyChart, go to [email or web address].    Your next appointment:   10 month(s)  Provider:   Dr. Izora Ribas    Your physician recommends that you schedule a follow-up appointment in: March with Dr. Royann Shivers.

## 2023-03-06 ENCOUNTER — Other Ambulatory Visit: Payer: Self-pay

## 2023-03-06 DIAGNOSIS — I10 Essential (primary) hypertension: Secondary | ICD-10-CM

## 2023-03-06 DIAGNOSIS — I421 Obstructive hypertrophic cardiomyopathy: Secondary | ICD-10-CM | POA: Diagnosis not present

## 2023-03-07 LAB — BASIC METABOLIC PANEL
BUN/Creatinine Ratio: 16 (ref 12–28)
BUN: 15 mg/dL (ref 8–27)
CO2: 24 mmol/L (ref 20–29)
Calcium: 10.5 mg/dL — ABNORMAL HIGH (ref 8.7–10.3)
Chloride: 105 mmol/L (ref 96–106)
Creatinine, Ser: 0.91 mg/dL (ref 0.57–1.00)
Glucose: 99 mg/dL (ref 70–99)
Potassium: 4.6 mmol/L (ref 3.5–5.2)
Sodium: 144 mmol/L (ref 134–144)
eGFR: 68 mL/min/{1.73_m2} (ref >59)

## 2023-03-10 ENCOUNTER — Ambulatory Visit: Payer: Medicare PPO | Admitting: Neurology

## 2023-03-10 ENCOUNTER — Encounter: Payer: Self-pay | Admitting: Neurology

## 2023-03-10 ENCOUNTER — Telehealth: Payer: Self-pay

## 2023-03-10 VITALS — BP 126/82 | HR 74 | Ht 62.0 in

## 2023-03-10 DIAGNOSIS — R413 Other amnesia: Secondary | ICD-10-CM | POA: Insufficient documentation

## 2023-03-10 DIAGNOSIS — G3184 Mild cognitive impairment, so stated: Secondary | ICD-10-CM | POA: Diagnosis not present

## 2023-03-10 DIAGNOSIS — Z0001 Encounter for general adult medical examination with abnormal findings: Secondary | ICD-10-CM | POA: Diagnosis not present

## 2023-03-10 DIAGNOSIS — R799 Abnormal finding of blood chemistry, unspecified: Secondary | ICD-10-CM | POA: Diagnosis not present

## 2023-03-10 DIAGNOSIS — Z79899 Other long term (current) drug therapy: Secondary | ICD-10-CM | POA: Diagnosis not present

## 2023-03-10 MED ORDER — BISOPROLOL FUMARATE 10 MG PO TABS: 10.0000 mg | ORAL_TABLET | Freq: Every day | ORAL | 3 refills | Status: DC

## 2023-03-10 NOTE — Patient Instructions (Addendum)
There are well-accepted and sensible ways to reduce risk for Alzheimers disease and other degenerative brain disorders .  Exercise Daily Walk A daily 20 minute walk should be part of your routine. Disease related apathy can be a significant roadblock to exercise and the only way to overcome this is to make it a daily routine and perhaps have a reward at the end (something your loved one loves to eat or drink perhaps) or a personal trainer coming to the home can also be very useful. Most importantly, the patient is much more likely to exercise if the caregiver / spouse does it with him/her. In general a structured, repetitive schedule is best.  General Health: Any diseases which effect your body will effect your brain such as a pneumonia, urinary infection, blood clot, heart attack or stroke. Keep contact with your primary care doctor for regular follow ups.  Sleep. A good nights sleep is healthy for the brain. Seven hours is recommended. If you have insomnia or poor sleep habits we can give you some instructions. If you have sleep apnea wear your mask.  Diet: Eating a heart healthy diet is also a good idea; fish and poultry instead of red meat, nuts (mostly non-peanuts), vegetables, fruits, olive oil or canola oil (instead of butter), minimal salt (use other spices to flavor foods), whole grain rice, bread, cereal and pasta and wine in moderation.Research is now showing that the MIND diet, which is a combination of The Mediterranean diet and the DASH diet, is beneficial for cognitive processing and longevity. Information about this diet can be found in The MIND Diet, a book by Alonna Minium, MS, RDN, and online at WildWildScience.es  Finances, Power of [address] and Advance Directives: You should consider putting legal safeguards in place with regard to financial and medical decision making. While the spouse always has power of attorney for medical and financial issues in the  absence of any form, you should consider what you want in case the spouse / caregiver is no longer around or capable of making decisions.   The Alzheimers Association Position on Disease Prevention  Can Alzheimer's be prevented? It's a question that continues to intrigue researchers and fuel new investigations. There are no clear-cut answers yet -- partially due to the need for more large-scale studies in diverse populations -- but promising research is under way. The Alzheimer's Association is leading the worldwide effort to find a treatment for Alzheimer's, delay its onset and prevent it from developing.   What causes Alzheimer's? Experts agree that in the vast majority of cases, Alzheimer's, like other common chronic conditions, probably develops as a result of complex interactions among multiple factors, including age, genetics, environment, lifestyle and coexisting medical conditions. Although some risk factors -- such as age or genes -- cannot be changed, other risk factors -- such as high blood pressure and lack of exercise -- usually can be changed to help reduce risk. Research in these areas may lead to new ways to detect those at highest risk.  Prevention studies A small percentage of people with Alzheimer's disease (less than 1 percent) have an early-onset type associated with genetic mutations. Individuals who have these genetic mutations are guaranteed to develop the disease. An ongoing clinical trial conducted by the [facility] ([facility]), is testing whether antibodies to beta-amyloid can reduce the accumulation of beta-amyloid plaque in the brains of people with such genetic mutations and thereby reduce, delay or prevent symptoms. Participants in the trial are receiving antibodies (  or placebo) before they develop symptoms, and the development of beta-amyloid plaques is being monitored by brain scans and other tests.  Another clinical trial, known as the A4 trial  (Anti-Amyloid Treatment in Asymptomatic Alzheimer's), is testing whether antibodies to beta-amyloid can reduce the risk of Alzheimer's disease in older people (ages 84 to 39) at high risk for the disease. The A4 trial is being conducted by the Alzheimer's Disease Cooperative Study.  Though research is still evolving, evidence is strong that people can reduce their risk by making key lifestyle changes, including participating in regular activity and maintaining good heart health. Based on this research, the Alzheimer's Association offers 10 Ways to Love Your Brain -- a collection of tips that can reduce the risk of cognitive decline.  Heart-head connection  New research shows there are things we can do to reduce the risk of mild cognitive impairment and dementia.  Several conditions known to increase the risk of cardiovascular disease -- such as high blood pressure, diabetes and high cholesterol -- also increase the risk of developing Alzheimer's. Some autopsy studies show that as many as 80 percent of individuals with Alzheimer's disease also have cardiovascular disease.  A longstanding question is why some people develop hallmark Alzheimer's plaques and tangles but do not develop the symptoms of Alzheimer's. Vascular disease may help researchers eventually find an answer. Some autopsy studies suggest that plaques and tangles may be present in the brain without causing symptoms of cognitive decline unless the brain also shows evidence of vascular disease. More research is needed to better understand the link between vascular health and Alzheimer's.  Physical exercise and diet Regular physical exercise may be a beneficial strategy to lower the risk of Alzheimer's and vascular dementia. Exercise may directly benefit brain cells by increasing blood and oxygen flow in the brain. Because of its known cardiovascular benefits, a medically approved exercise program is a valuable part of any overall wellness  plan.  Current evidence suggests that heart-healthy eating may also help protect the brain. Heart-healthy eating includes limiting the intake of sugar and saturated fats and making sure to eat plenty of fruits, vegetables, and whole grains. No one diet is best. Two diets that have been studied and may be beneficial are the DASH (Dietary Approaches to Stop Hypertension) diet and the Mediterranean diet. The DASH diet emphasizes vegetables, fruits and fat-free or low-fat dairy products; includes whole grains, fish, poultry, beans, seeds, nuts and vegetable oils; and limits sodium, sweets, sugary beverages and red meats. A Mediterranean diet includes relatively little red meat and emphasizes whole grains, fruits and vegetables, fish and shellfish, and nuts, olive oil and other healthy fats.  Social connections and intellectual activity A number of studies indicate that maintaining strong social connections and keeping mentally active as we age might lower the risk of cognitive decline and Alzheimer's. Experts are not certain about the reason for this association. It may be due to direct mechanisms through which social and mental stimulation strengthen connections between nerve cells in the brain.  Head trauma There appears to be a strong link between future risk of Alzheimer's and serious head trauma, especially when injury involves loss of consciousness. You can help reduce your risk of Alzheimer's by protecting your head.  Wear a seat belt  Use a helmet when participating in sports  "Fall-proof" your home   What you can do now While research is not yet conclusive, certain lifestyle choices, such as physical activity and diet, may help support brain  health and prevent Alzheimer's. Many of these lifestyle changes have been shown to lower the risk of other diseases, like heart disease and diabetes, which have been linked to Alzheimer's. With few drawbacks and plenty of known benefits, healthy lifestyle  choices can improve your health and possibly protect your brain.  Learn more about brain health. You can help increase our knowledge by considering participation in a clinical study. Our free clinical trial matching services, TrialMatch, can help you find clinical trials in your area that are seeking volunteers.  Understanding prevention research Here are some things to keep in mind about the research underlying much of our current knowledge about possible prevention:  Insights about potentially modifiable risk factors apply to large population groups, not to individuals. Studies can show that factor X is associated with outcome Y, but cannot guarantee that any specific person will have that outcome. As a result, you can "do everything right" and still have a serious health problem or "do everything wrong" and live to be 100.  Much of our current evidence comes from large epidemiological studies such as the [place] Aging Study, the Nurses' Health Study, the Adult Changes in Thought Study and the [employer]. These studies explore pre-existing behaviors and use statistical methods to relate those behaviors to health outcomes. This type of study can show an "association" between a factor and an outcome but cannot "prove" cause and effect. This is why we describe evidence based on these studies with such language as "suggests," "may show," "might protect," and "is associated with."  The gold standard for showing cause and effect is a clinical trial in which participants are randomly assigned to a prevention or risk management strategy or a control group. Researchers follow the two groups over time to see if their outcomes differ significantly.  It is unlikely that some prevention or risk management strategies will ever be tested in randomized trials for ethical or practical reasons. One example is exercise. Definitively testing the impact of exercise on Alzheimer's risk would require a huge  trial enrolling thousands of people and following them for many years. The expense and logistics of such a trial would be prohibitive, and it would require some people to go without exercise, a known health benefit.     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  Use melatonin and if needed trazodone for sleep. Read up on the MIND diet for memory patients/.    I ordered the following tests   MRI if pacemaker is compatible, see dr Phillips Odor for additional info.  Otherwise CT brain  ATN and alzheimers genetic risk panel  Referral to neuropsychological testing for differentiated memory loss.  Next step would be a PET scan amyloid.    Potential leqembi patient ?   Rv in 3-4 months.

## 2023-03-10 NOTE — Progress Notes (Addendum)
[facility]  Provider:  Dr Blanca Carreon Referring Provider: Donetta Potts, MD Primary Care Physician:  Donetta Potts, MD  Chief Complaint  Patient presents with   New Patient (Initial Visit)    Patient in room #1 with her husband. Patient states she here to discuss her memory issues.    HPI:  Alexandria French is a 72 y.o. female and seen here upon referral from Dr. Mayford Knife for a Consultation/ Evaluation of memory loss.   This patient reports gradual onset of STM loss and word finding problems  over a period of 12-24 months.   Family  history, mother died of cancer at age 67,  father died at 68 and was in the end" feeble". Maternal grandfather died at 56? With memory problems.   Maternal aunts  with cancer but no dementia history   Social history : married with adult child, Dannielle Huh , now 49 .    HS graduate - no college experience. I worked at [employer] in [place] and then switched to [employer].  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   Review of Systems: Out of a complete 14 system review, the patient complains of only the following symptoms, and all other reviewed systems are negative.  MOCA 23/ 30. MMSE 28/30  Social History   Socioeconomic History   Marital status: Married    Spouse name: Not on file   Number of children: Not on file   Years of education: Not on file   Highest education level: Not on file  Occupational History   Not on file  Tobacco Use   Smoking status: Never   Smokeless tobacco: Never  Vaping Use   Vaping status: Never Used  Substance and Sexual Activity   Alcohol use: No    Alcohol/week: 0.0 standard drinks of alcohol   Drug use: No   Sexual activity: Not on file  Other Topics Concern   Not on file  Social History Narrative   Not on file   Social Determinants of Health   Financial Resource Strain: Not on file  Food Insecurity: Not on file  Transportation Needs: Not on file  Physical Activity: Not on  file  Stress: Not on file  Social Connections: Not on file  Intimate Partner Violence: Not on file    Family History  Problem Relation Age of Onset   Heart disease Other    Osteoporosis Other    Arthritis Other    Colon cancer Neg Hx    Colon polyps Neg Hx     Past Medical History:  Diagnosis Date   A-fib (HCC)    CAD (coronary artery disease)    Colon adenocarcinoma (HCC)    resected no other treatment   Complication of anesthesia    sometimes BP flucuates   Diverticulosis    Esophageal reflux    no meds   External hemorrhoids    Gastritis    Hypertension    IDA (iron deficiency anemia)    Internal hemorrhoids    Other and unspecified hyperlipidemia    Presence of permanent cardiac pacemaker 2021   bost sci   SSS (sick sinus syndrome) [facility])     Past Surgical History:  Procedure Laterality Date   ABDOMINAL HYSTERECTOMY  08/2008   BIOPSY  12/20/2020   Procedure: BIOPSY;  Surgeon: Iva Boop, MD;  Location: [facility] ENDOSCOPY;  Service: Endoscopy;;   BIOPSY  01/03/2022   Procedure: BIOPSY;  Surgeon: Beverley Fiedler, MD;  Location: [facility] ENDOSCOPY;  Service: Gastroenterology;;   BLADDER REPAIR     COLONOSCOPY WITH PROPOFOL N/A 12/20/2020   Procedure: COLONOSCOPY WITH PROPOFOL;  Surgeon: Iva Boop, MD;  Location: [facility] ENDOSCOPY;  Service: Endoscopy;  Laterality: N/A;   COLONOSCOPY WITH PROPOFOL N/A 01/03/2022   Procedure: COLONOSCOPY WITH PROPOFOL;  Surgeon: Beverley Fiedler, MD;  Location: [facility] ENDOSCOPY;  Service: Gastroenterology;  Laterality: N/A;   HEMOSTASIS CLIP PLACEMENT  01/03/2022   Procedure: HEMOSTASIS CLIP PLACEMENT;  Surgeon: Beverley Fiedler, MD;  Location: [facility] ENDOSCOPY;  Service: Gastroenterology;;   LAPAROSCOPIC PARTIAL COLECTOMY N/A 12/22/2020   Procedure: LAPAROSCOPIC ASSISTED PARTIAL COLECTOMY;  Surgeon: Abigail Miyamoto, MD;  Location: [facility] OR;  Service: General;  Laterality: N/A;   PACEMAKER IMPLANT N/A 02/28/2020   Procedure: PACEMAKER IMPLANT;  Surgeon:  Thurmon Fair, MD;  Location: [facility] INVASIVE CV LAB;  Service: Cardiovascular;  Laterality: N/A;   POLYPECTOMY  01/03/2022   Procedure: POLYPECTOMY;  Surgeon: Beverley Fiedler, MD;  Location: [facility] ENDOSCOPY;  Service: Gastroenterology;;   RIGHT/LEFT HEART CATH AND CORONARY ANGIOGRAPHY N/A 12/15/2020   Procedure: RIGHT/LEFT HEART CATH AND CORONARY ANGIOGRAPHY;  Surgeon: Marykay Lex, MD;  Location: [facility] INVASIVE CV LAB;  Service: Cardiovascular;  Laterality: N/A;   TONSILLECTOMY      Current Outpatient Medications  Medication Sig Dispense Refill   acetaminophen (TYLENOL) 500 MG tablet Take 2 tablets (1,000 mg total) by mouth every 6 (six) hours as needed for fever or mild pain. 30 tablet 0   augmented betamethasone dipropionate (DIPROLENE-AF) 0.05 % cream Apply topically as needed.     Calcium Carbonate-Vitamin D 600-400 MG-UNIT tablet Take 1 tablet by mouth 2 (two) times daily.      cetirizine (ZYRTEC) 10 MG tablet Take 10 mg by mouth every evening.      Coenzyme Q10 200 MG capsule Take 200 mg by mouth daily.     diltiazem (CARDIZEM) 30 MG tablet Take 1 tablet (30 mg total) by mouth 3 (three) times daily as needed (palpitations). 30 tablet 3   diphenhydrAMINE (BENADRYL) 25 MG tablet Take 12.5 mg by mouth at bedtime.     ELIQUIS 5 MG TABS tablet TAKE 1 TABLET BY MOUTH TWICE A DAY 180 tablet 1   empagliflozin (JARDIANCE) 10 MG TABS tablet Take 1 tablet (10 mg total) by mouth daily before breakfast. 90 tablet 3   empagliflozin (JARDIANCE) 10 MG TABS tablet Take 1 tablet (10 mg total) by mouth daily before breakfast. 28 tablet 0   ferrous sulfate 325 (65 FE) MG EC tablet Take 325 mg by mouth at bedtime.     furosemide (LASIX) 20 MG tablet Take 1 tablet (20 mg total) by mouth daily as needed (swelling). 30 tablet 11   melatonin 5 MG TABS Take 2.5 mg by mouth at bedtime.     simvastatin (ZOCOR) 20 MG tablet Take 20 mg by mouth daily.     spironolactone (ALDACTONE) 25 MG tablet Take 0.5 tablets (12.5 mg  total) by mouth daily. Take half a pill once daily 45 tablet 3   vitamin C (ASCORBIC ACID) 500 MG tablet Take 500 mg by mouth daily.     bisoprolol (ZEBETA) 10 MG tablet Take 1 tablet (10 mg total) by mouth daily. (Patient not taking: Reported on 03/10/2023) 90 tablet 3   No current facility-administered medications for this visit.    Allergies as of 03/10/2023 - Review Complete 03/10/2023  Allergen Reaction Noted   Cefzil [cefprozil] Other (See Comments) 08/13/2013  Vitals: BP 126/82 (BP Location: Right Arm, Patient Position: Sitting, Cuff Size: Small)   Pulse 74   Ht 5\' 2"  (1.575 m)   BMI 24.29 kg/m  Last Weight:  Wt Readings from Last 1 Encounters:  02/20/23 132 lb 12.8 oz (60.2 kg)   Last Height:   Ht Readings from Last 1 Encounters:  03/10/23 5\' 2"  (1.575 m)   Leneral: The patient is awake, alert and appears not in acute distress. The patient is well groomed. Head: Normocephalic, atraumatic. Neck is supple.  Mallampati, 3 neck circumference:14.5  Cardiovascular:  Regular rate without murmurs or carotid bruit, and without distended neck veins. Pacemaker in situ ( Dr Phillips Odor)  Respiratory: Lungs are clear to auscultation. Skin:  With evidence of edema, now on Jardiance.    Neurologic exam : The patient is awake and alert, oriented to place and time and reason for this visit. .   Memory subjective  described as impact   The attention span & concentration ability is affected , increasingly over the last 2 years, more so the last 12 months.       No data to display         MOCA 23/ 30 -  5 word recall impaired, F word fluency impaired.     03/10/2023    2:04 PM  MMSE - Mini Mental State Exam  Orientation to time 4  Orientation to Place 5  Registration 3  Attention/ Calculation 5  Recall 3  Language- name 2 objects 2  Language- repeat 1  Language- follow 3 step command 3  Language- read & follow direction 0  Write a sentence 1  Copy design 1  Total score 28       Speech is fluent without  dysarthria, dysphonia or aphasia.  Mood and affect are appropriate.  Cranial nerves: Pupils are equally round, large,  and briskly reactive to light.  Funduscopic exam :no evidence of pallor or edema. Extraocular movements  in vertical and horizontal planes intact and without nystagmus. Visual fields by finger perimetry are intact. Hearing to finger rub intact.  Facial sensation intact to fine touch. Facial motor strength is symmetric and tongue and uvula move midline.  Motor exam:  = Normal tone and normal muscle bulk and symmetric normal strength in all extremities.  Sensory:  Fine touch, pinprick and vibration were tested in all extremities. Proprioception is tested in the upper extremities only. This was  normal.  Coordination: Rapid alternating movements in the fingers/hands is tested and normal. Finger-to-nose maneuver tested and normal without evidence of ataxia, dysmetria or tremor.  Gait and station: Patient walks without assistive device and is able and assisted stool climb up to the exam table. Strength within normal limits. Stance is stable and normal.  She reports difficulties with escalators, moving targets.   Tandem gait is unimpaired, turns with 3 Steps are unfragmented. Romberg testing is normal.  Deep tendon reflexes: in the  upper and lower extremities are symmetric and intact. Babinski maneuver response is  downgoing.  I reviewed detailed metabolic tests form PCP, dated 01-08-2023, some 12. 5. 24 BMET. Norma; TSH.   After physical and neurologic examination, review of laboratory studies, imaging, neurophysiology testing and pre-existing records,  Assessment:     There is significant enough impairment in the Short Term Memory section, a semantic  and recall issue.  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  Use  melatonin and if needed trazodone for  sleep. Read up on the MIND diet for memory patients/.    I ordered the following tests   MRI if pacemaker is compatible, see dr Phillips Odor for additional info.  Otherwise CT brain  ATN and alzheimers genetic risk panel  Referral to neuropsychological testing for differentiated memory loss.  Next step would be a PET scan amyloid.   Rv in 3-4 months.     Assessment: Total time for face to face interview and examination, for review of  images and laboratory testing, neurophysiology testing and pre-existing records, including out-of -network , was 45 minutes.

## 2023-03-10 NOTE — Telephone Encounter (Signed)
The patient has been notified of the result and verbalized understanding.  All questions (if any) were answered. Macie Burows, RN 03/10/2023 11:41 AM   Spouse reports was told by pharmacy that cost of [place] Pepper is very expensive and can take Losartan in its place.   Gave pt telephone number for Jardiance pt assistance.  Advised pt to call today to see if qualifies for pt assistance.  Also advised pt to use 30 day free trial offer to cover 1st month while gets application in assistance. Expresses understanding no further questions at this time.

## 2023-03-10 NOTE — Addendum Note (Signed)
Addended by: Melvyn Novas on: 03/10/2023 03:10 PM   Modules accepted: Orders

## 2023-03-10 NOTE — Telephone Encounter (Signed)
-----   Message from Christell Constant sent at 03/08/2023 10:32 AM EST ----- Results: Caclium near her baseline; rest of labs WNL Plan: Decrease bisoprolol dose to just 10 mg Daily if still symptomatic.  Have her call in if DOE worsens but fatigue improves.  Christell Constant, MD

## 2023-03-12 ENCOUNTER — Telehealth: Payer: Self-pay | Admitting: Internal Medicine

## 2023-03-12 NOTE — Telephone Encounter (Signed)
Pt's husband would like to speak with Shamea. Please advise

## 2023-03-12 NOTE — Telephone Encounter (Signed)
Left a message to call back.

## 2023-03-13 ENCOUNTER — Other Ambulatory Visit (HOSPITAL_COMMUNITY): Payer: Self-pay

## 2023-03-13 ENCOUNTER — Telehealth: Payer: Self-pay

## 2023-03-13 NOTE — Telephone Encounter (Signed)
Pt spouse returning call, he asked that you call home number.

## 2023-03-13 NOTE — Telephone Encounter (Signed)
No sustained arrhythmia.  Reduced frequency of atrial and ventricular pacing in last 3 weeks, roughly coincides with initiation of Jardiance.  Electrolytes were normal on labs 03/06/2023. Did she reduce her dose of bisoprolol by any chance? This could explain the increased ectopy. Make sure she has good intake of water throughout the day.

## 2023-03-13 NOTE — Telephone Encounter (Signed)
Transmission received.  Presenting rhythm AS/VS with ectopy.  Appears Pt may be having more ectopy.  BIV pacing has decreased recently.  Will send to Dr. Royann Shivers for review/advisement.

## 2023-03-13 NOTE — Telephone Encounter (Signed)
Patient approved and enrolled in [employer].

## 2023-03-13 NOTE — Telephone Encounter (Signed)
She is currently taking Bisoprolol 10 mg every morning. She reports that she probably does not drink enough water daily.  Encouraged her to increase water intake- make sure staying hydrated. She verbalized understanding.   Informed that I would send this info to Dr Royann Shivers and we would contact her with any further questions or recommendations.

## 2023-03-13 NOTE — Telephone Encounter (Signed)
Called pt spouse wants to report Tues night around 8 pm; pt had an episode where heart was facing also felt like PPM was moving.   Gave diltiazem 30 mg had pt to lie flat and felt better fairly quickly.   Pt has had no further episodes.  BP 105/61-65 Denies dizziness and discomfort.  Advised to continue to monitor if continues to occur to contact our office for further direction.   Will send message to device clinic to review.    Pt also reports called pt assistance for Jardiance; was on hold for an hour before hanging up.  Advised pt I will send a message to our pt assistance coordinator to determine if pt may qualify.

## 2023-03-13 NOTE — Telephone Encounter (Signed)
Patient Advocate Encounter   The patient was approved for a [employer] grant that will help cover the cost of JARDIANCE Total amount awarded, $10,000.  Effective: 02/11/23 - 02/10/24   UJW:[id number] WGN:[id number] MVHQI:[id number]   Pharmacy provided with approval and processing information. Tried to inform patient of grant approval but number just rings and doesn't give a VM option. Pharmacy will attempt to call patient also once they bill the grant.   Haze Rushing, CPhT  Pharmacy Patient Advocate Specialist  Direct Number: [phone] Fax: [phone]

## 2023-03-13 NOTE — Telephone Encounter (Signed)
Outreach made to Pt.  Requested a remote transmission.  Will monitor for transmission.

## 2023-03-17 ENCOUNTER — Telehealth: Payer: Self-pay | Admitting: Neurology

## 2023-03-17 NOTE — Telephone Encounter (Signed)
Patient's husband is returning call and is requesting call back.

## 2023-03-17 NOTE — Telephone Encounter (Signed)
MRI brain [facility]: [id number] exp.03/17/23-05/16/23  CT head [employer] auth: [id number] exp. 03/17/23-05/16/23 sent to [facility] [phone]

## 2023-03-18 ENCOUNTER — Telehealth: Payer: Self-pay | Admitting: *Deleted

## 2023-03-18 MED ORDER — BISOPROLOL FUMARATE 10 MG PO TABS: ORAL_TABLET | ORAL | 3 refills | Status: DC

## 2023-03-18 NOTE — Telephone Encounter (Signed)
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  The absence of amyloid index abnormalities speaks against Alzheimer's disease being present. The APO Epsilon genetic risk factor was pending.

## 2023-03-18 NOTE — Telephone Encounter (Signed)
Spoke to wife and husband gave lab results Made pt aware we will call back with the remaining blood work comes back Pt and husband expressed understanding and thanked me for calling

## 2023-03-18 NOTE — Telephone Encounter (Signed)
Spoke to [employer] ([facility]pr) went over recommendations from provider to go back up to 15 mg of Bisoprolol a day. Will take 10 mg every morning and 5 mg every evening. He verbalized understanding and RX sent to preferred pharmacy.

## 2023-03-18 NOTE — Addendum Note (Signed)
Addended by: Scheryl Marten on: 03/18/2023 08:52 AM   Modules accepted: Orders

## 2023-03-21 LAB — ATN PROFILE
A -- Beta-amyloid 42/40 Ratio: 0.105 (ref >0.102)
Beta-amyloid 40: 205.7 pg/mL
Beta-amyloid 42: 21.58 pg/mL
N -- NfL, Plasma: 2.53 pg/mL (ref 0.00–7.64)
T -- p-tau181: 1.4 pg/mL — ABNORMAL HIGH (ref 0.00–0.97)

## 2023-03-21 LAB — APOE ALZHEIMER'S RISK

## 2023-03-24 ENCOUNTER — Telehealth: Payer: Self-pay | Admitting: Internal Medicine

## 2023-03-24 NOTE — Telephone Encounter (Signed)
Very helpful, thanks so much Shamea!

## 2023-03-24 NOTE — Telephone Encounter (Signed)
Pt c/o medication issue:  1. Name of Medication: Jardiance  2. How are you currently taking this medication (dosage and times per day)?   3. Are you having a reaction (difficulty breathing--STAT)?   4. What is your medication issue? Patient's husband called- he said patient need to be prescribed another medicine please. He said he insurance will not pay for the Jardiance.

## 2023-03-24 NOTE — Telephone Encounter (Signed)
Called pt spouse advised of the following: Patient Advocate Encounter   The patient was approved for a [employer] grant that will help cover the cost of JARDIANCE Total amount awarded, $10,000.  Effective: 02/11/23 - 02/10/24   Advised spouse to contact pharmacy with this updated information.  If pharmacy has questions advised to contact our office. Spouse had no further questions or concerns.

## 2023-03-28 DIAGNOSIS — E039 Hypothyroidism, unspecified: Secondary | ICD-10-CM | POA: Diagnosis not present

## 2023-04-01 ENCOUNTER — Ambulatory Visit (HOSPITAL_COMMUNITY)
Admission: RE | Admit: 2023-04-01 | Discharge: 2023-04-01 | Disposition: A | Discharge status: Home / Self Care | Payer: Medicare PPO | Source: Ambulatory Visit | Attending: Neurology | Admitting: Neurology

## 2023-04-01 DIAGNOSIS — G3184 Mild cognitive impairment, so stated: Secondary | ICD-10-CM

## 2023-04-01 DIAGNOSIS — R413 Other amnesia: Secondary | ICD-10-CM | POA: Insufficient documentation

## 2023-04-03 ENCOUNTER — Ambulatory Visit (INDEPENDENT_AMBULATORY_CARE_PROVIDER_SITE_OTHER): Payer: Medicare PPO

## 2023-04-03 DIAGNOSIS — I421 Obstructive hypertrophic cardiomyopathy: Secondary | ICD-10-CM | POA: Diagnosis not present

## 2023-04-06 LAB — CUP PACEART REMOTE DEVICE CHECK
Battery Remaining Longevity: 102 mo
Battery Remaining Percentage: 97 %
Brady Statistic RA Percent Paced: 94 %
Brady Statistic RV Percent Paced: 92 %
Date Time Interrogation Session: 20250102021100
Implantable Lead Connection Status: 753985
Implantable Lead Connection Status: 753985
Implantable Lead Implant Date: 20211129
Implantable Lead Implant Date: 20211129
Implantable Lead Location: 753859
Implantable Lead Location: 753860
Implantable Lead Model: 7840
Implantable Lead Model: 7841
Implantable Lead Serial Number: 1036898
Implantable Lead Serial Number: 1100104
Implantable Pulse Generator Implant Date: 20211129
Lead Channel Impedance Value: 684 Ohm
Lead Channel Impedance Value: 916 Ohm
Lead Channel Pacing Threshold Amplitude: 0.4 V
Lead Channel Pacing Threshold Amplitude: 0.7 V
Lead Channel Pacing Threshold Pulse Width: 0.4 ms
Lead Channel Pacing Threshold Pulse Width: 0.4 ms
Lead Channel Setting Pacing Amplitude: 3.5 V
Lead Channel Setting Pacing Amplitude: 3.5 V
Lead Channel Setting Pacing Pulse Width: 0.4 ms
Lead Channel Setting Sensing Sensitivity: 2.5 mV
Pulse Gen Serial Number: 951099
Zone Setting Status: 755011

## 2023-04-07 DIAGNOSIS — I10 Essential (primary) hypertension: Secondary | ICD-10-CM | POA: Diagnosis not present

## 2023-04-07 DIAGNOSIS — Z6824 Body mass index (BMI) 24.0-24.9, adult: Secondary | ICD-10-CM | POA: Diagnosis not present

## 2023-04-07 DIAGNOSIS — D509 Iron deficiency anemia, unspecified: Secondary | ICD-10-CM | POA: Diagnosis not present

## 2023-04-07 DIAGNOSIS — I421 Obstructive hypertrophic cardiomyopathy: Secondary | ICD-10-CM | POA: Diagnosis not present

## 2023-04-07 DIAGNOSIS — R5383 Other fatigue: Secondary | ICD-10-CM | POA: Diagnosis not present

## 2023-04-07 DIAGNOSIS — I447 Left bundle-branch block, unspecified: Secondary | ICD-10-CM | POA: Diagnosis not present

## 2023-04-07 DIAGNOSIS — N393 Stress incontinence (female) (male): Secondary | ICD-10-CM | POA: Diagnosis not present

## 2023-04-07 DIAGNOSIS — G4700 Insomnia, unspecified: Secondary | ICD-10-CM | POA: Diagnosis not present

## 2023-04-07 DIAGNOSIS — G2581 Restless legs syndrome: Secondary | ICD-10-CM | POA: Diagnosis not present

## 2023-04-15 ENCOUNTER — Telehealth: Payer: Self-pay | Admitting: *Deleted

## 2023-04-15 NOTE — Telephone Encounter (Signed)
-----   Message from Ralston Dohmeier sent at 04/14/2023  5:08 PM EST ----- No abnormal atrophy, stroke or scar tissue in the brain.   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

## 2023-04-15 NOTE — Telephone Encounter (Signed)
 Patient called back and  informed with results.

## 2023-04-15 NOTE — Telephone Encounter (Signed)
 Left message for patient to call.

## 2023-05-15 NOTE — Progress Notes (Signed)
Remote pacemaker transmission.

## 2023-05-15 NOTE — Addendum Note (Signed)
Addended by: Elease Etienne A on: 05/15/2023 10:46 AM   Modules accepted: Orders

## 2023-06-19 ENCOUNTER — Encounter: Payer: Self-pay | Admitting: Cardiovascular Disease

## 2023-06-19 ENCOUNTER — Ambulatory Visit: Payer: Medicare PPO | Attending: Cardiovascular Disease | Admitting: Cardiovascular Disease

## 2023-06-19 VITALS — BP 114/70 | HR 64 | Ht 60.0 in | Wt 131.0 lb

## 2023-06-19 DIAGNOSIS — R7303 Prediabetes: Secondary | ICD-10-CM

## 2023-06-19 DIAGNOSIS — E7800 Pure hypercholesterolemia, unspecified: Secondary | ICD-10-CM | POA: Diagnosis not present

## 2023-06-19 DIAGNOSIS — I421 Obstructive hypertrophic cardiomyopathy: Secondary | ICD-10-CM | POA: Diagnosis not present

## 2023-06-19 DIAGNOSIS — I5032 Chronic diastolic (congestive) heart failure: Secondary | ICD-10-CM

## 2023-06-19 DIAGNOSIS — D6869 Other thrombophilia: Secondary | ICD-10-CM

## 2023-06-19 DIAGNOSIS — I480 Paroxysmal atrial fibrillation: Secondary | ICD-10-CM | POA: Diagnosis not present

## 2023-06-19 DIAGNOSIS — I495 Sick sinus syndrome: Secondary | ICD-10-CM

## 2023-06-19 DIAGNOSIS — I2510 Atherosclerotic heart disease of native coronary artery without angina pectoris: Secondary | ICD-10-CM

## 2023-06-19 DIAGNOSIS — I10 Essential (primary) hypertension: Secondary | ICD-10-CM | POA: Diagnosis not present

## 2023-06-19 DIAGNOSIS — Z950 Presence of cardiac pacemaker: Secondary | ICD-10-CM | POA: Diagnosis not present

## 2023-06-19 MED ORDER — SPIRONOLACTONE 25 MG PO TABS: 12.5000 mg | ORAL_TABLET | Freq: Every day | ORAL | 3 refills | Status: DC

## 2023-06-19 NOTE — Progress Notes (Signed)
 Cardiology Office Note:    Date:  06/19/2023   ID:  Alexandria French, DOB 1951/02/09, MRN [id number]  PCP:  Donetta Potts, MD  [facility] Cardiologist:  Jasmon Graffam [facility] Electrophysiologist:  None   Referring MD: Donetta Potts, MD   No chief complaint on file.    History of Present Illness:    Alexandria French is a 73 y.o. female with a hx of paroxysmal atrial fibrillation, left bundle branch block, HOCM, sinus bradycardia, elevated coronary calcium score without significant coronary stenosis by coronary CT angiogram (12/2018).  Feels better on beta-blockers, but the dose was limited by bradycardia.  Underwent pacemaker implantation in late November 2021.  Symptomatic anemia following initiation of anticoagulants led to a diagnosis of adenocarcinoma of the right colon for which she underwent hemicolectomy in 2022.  She describes herself as feeling "fair".  She does not have the energy she would like.  She tires easily.  She had 1 episode of very rapid palpitations that made her feel unwell but by the time she went to lie down in bed the episode was already subsiding.  No correlation with any meaningful rhythm abnormality on her pacemaker interrogation today.  She has lower extremity edema that has not improved after starting Jardiance.  She was recommended spironolactone 12.5 mg daily but never started the prescription.  She does not have clear-cut dyspnea with exertion, orthopnea, PND.  Has noticed that her short-term memory is not quite as good and is scheduled for neuropsychological testing in June.  However, she had similar complaints a few years ago she actually did really well on the MMSE.  Pacemaker interrogation shows findings that are similar to previous downloads.  Presenting rhythm is atrial paced, ventricular paced with very frequent PVCs.  At times every third or fourth beat is a PVC.  Estimated gentle longevity is 8 years.  All lead parameters are stable and in  nominal range.  She has not had any episodes of ventricular tachycardia or atrial fibrillation.  Total number of PVCs in the last 12 months was 427,000.  Which does not appear to be a lot different from her usual average.  The heart rate histogram distribution appears appropriate.  No changes were made to device settings.  In the past, frequent PVCs often in a pattern of trigeminy have caused artifactual bradycardia and erratic blood pressure readings.  Her most recent echocardiogram did not show any evidence of left ventricular outflow tract obstruction.  She has not required treatment with Camzyos.  Her most recent ECG from 02/20/2023 shows AV sequential pacing with a single PVC.  Positive paced R wave in lead V1 and broad QRS 160 ms.  In September 2022 she presented with severe symptomatic anemia (hemoglobin around 8) and was found to have a adenocarcinoma of the right colon.  She underwent a partial ileocolectomy on 12/22/2020. She will not require chemotherapy.  Labs showed findings consistent with iron deficiency.  Her most recent hemoglobin on October 10 was 10.7.  Her gastroenterologist is Dr. Leone Payor.  Her surgeon was Dr. Magnus Ivan.  During the hospitalization she had problems with hypotension.  She underwent cardiac catheterization which showed angiographically normal coronary arteries and confirmed the presence of a significant LV outflow tract gradient.  She was treated with fluids and midodrine.  She subsequently stopped her midodrine since she noticed that her blood pressure was too high.  Prior to hospitalization, he had been treated with bisoprolol which she seemed to tolerate better than metoprolol.  She was discharged from the hospital metoprolol to 50 mg twice daily.  She wore an event monitor that showed prominent sinus bradycardia and chronotropic incompetence, with a maximum heart rate but only reached 60% of max predicted.  She did have frequent mostly monomorphic PVCs (6% of all  beats).  Also had some brief bursts of nonsustained atrial tachycardia.  There was no complex ventricular arrhythmia. She has not had full syncope previously or documented VT. There is no family history of HCM, VT, sudden death or CHF.  2 separate echocardiograms performed in August 2020 in May 2021 show findings consistent with hypertrophic obstructive cardiomyopathy, although the degree of left ventricular hypertrophy is relatively mild and appears to be concentric rather than asymmetrical.  Systolic anterior motion of the mitral valve with increased gradients across the left ventricular outflow tract.  These have been calculated at 145 mmHg and 213 mmHg respectively, although I am not sure that the estimation is accurate.  There seems to be a lot of contamination with mitral regurgitant flow on the Doppler signal.  There is however convincing evidence of gradients across the LVOT of at least 50 mmHg at rest.  On both studies the left ventricular systolic function is hyperdynamic with ejection fraction in excess of 75%.  There is no evidence of major abnormalities of the aortic valve itself.  Coronary CT angiogram performed in September 2020 showed a calcium score in the 85th percentile with no major coronary artery stenoses.  The coronary system is left dominant.  There is very mild bilateral carotid artery plaque with stenoses less than 40%.  Atrial fibrillation was reportedly detected on a previous cardiac monitor in June 2019.  The ventricular rate was over 178 bpm.  The episode was associated with dizziness and lightheadedness.  I am not sure it can accurately be categorized as atrial fibrillation since it was so brief, but she definitely had at least paroxysmal atrial tachycardia.  Although frequent PVCs including ventricular bigeminy were recorded, there was no evidence of ventricular tachycardia.  She has a history of essential hypertension.  She presented with profound anemia and hypotension and  was found to have a right colon adenocarcinoma for which she underwent surgery December 22, 2020.  Past Medical History:  Diagnosis Date   A-fib [facility])    CAD (coronary artery disease)    Colon adenocarcinoma (HCC)    resected no other treatment   Complication of anesthesia    sometimes BP flucuates   Diverticulosis    Esophageal reflux    no meds   External hemorrhoids    Gastritis    Hypertension    IDA (iron deficiency anemia)    Internal hemorrhoids    Other and unspecified hyperlipidemia    Presence of permanent cardiac pacemaker 2021   bost sci   SSS (sick sinus syndrome) [facility])     Past Surgical History:  Procedure Laterality Date   ABDOMINAL HYSTERECTOMY  08/2008   BIOPSY  12/20/2020   Procedure: BIOPSY;  Surgeon: Iva Boop, MD;  Location: [facility] ENDOSCOPY;  Service: Endoscopy;;   BIOPSY  01/03/2022   Procedure: BIOPSY;  Surgeon: Beverley Fiedler, MD;  Location: [facility] ENDOSCOPY;  Service: Gastroenterology;;   BLADDER REPAIR     COLONOSCOPY WITH PROPOFOL N/A 12/20/2020   Procedure: COLONOSCOPY WITH PROPOFOL;  Surgeon: Iva Boop, MD;  Location: [facility] ENDOSCOPY;  Service: Endoscopy;  Laterality: N/A;   COLONOSCOPY WITH PROPOFOL N/A 01/03/2022   Procedure: COLONOSCOPY WITH PROPOFOL;  Surgeon: Rhea Belton,  Carie Caddy, MD;  Location: [facility] ENDOSCOPY;  Service: Gastroenterology;  Laterality: N/A;   HEMOSTASIS CLIP PLACEMENT  01/03/2022   Procedure: HEMOSTASIS CLIP PLACEMENT;  Surgeon: Beverley Fiedler, MD;  Location: [facility] ENDOSCOPY;  Service: Gastroenterology;;   LAPAROSCOPIC PARTIAL COLECTOMY N/A 12/22/2020   Procedure: LAPAROSCOPIC ASSISTED PARTIAL COLECTOMY;  Surgeon: Abigail Miyamoto, MD;  Location: [facility] OR;  Service: General;  Laterality: N/A;   PACEMAKER IMPLANT N/A 02/28/2020   Procedure: PACEMAKER IMPLANT;  Surgeon: Thurmon Fair, MD;  Location: [facility] INVASIVE CV LAB;  Service: Cardiovascular;  Laterality: N/A;   POLYPECTOMY  01/03/2022   Procedure: POLYPECTOMY;  Surgeon: Beverley Fiedler, MD;   Location: [facility] ENDOSCOPY;  Service: Gastroenterology;;   RIGHT/LEFT HEART CATH AND CORONARY ANGIOGRAPHY N/A 12/15/2020   Procedure: RIGHT/LEFT HEART CATH AND CORONARY ANGIOGRAPHY;  Surgeon: Marykay Lex, MD;  Location: [facility] INVASIVE CV LAB;  Service: Cardiovascular;  Laterality: N/A;   TONSILLECTOMY      Current Medications: Current Meds  Medication Sig   augmented betamethasone dipropionate (DIPROLENE-AF) 0.05 % cream Apply topically as needed.   bisoprolol (ZEBETA) 10 MG tablet Take 10 mg every morning and 5 mg every evening   Calcium Carbonate-Vitamin D 600-400 MG-UNIT tablet Take 1 tablet by mouth 2 (two) times daily.    cetirizine (ZYRTEC) 10 MG tablet Take 10 mg by mouth every evening.    Coenzyme Q10 200 MG capsule Take 200 mg by mouth daily.   diltiazem (CARDIZEM) 30 MG tablet Take 1 tablet (30 mg total) by mouth 3 (three) times daily as needed (palpitations).   diphenhydrAMINE (BENADRYL) 25 MG tablet Take 12.5 mg by mouth at bedtime.   ELIQUIS 5 MG TABS tablet TAKE 1 TABLET BY MOUTH TWICE A DAY   empagliflozin (JARDIANCE) 10 MG TABS tablet Take 1 tablet (10 mg total) by mouth daily before breakfast.   empagliflozin (JARDIANCE) 10 MG TABS tablet Take 1 tablet (10 mg total) by mouth daily before breakfast.   ferrous sulfate 325 (65 FE) MG EC tablet Take 325 mg by mouth at bedtime.   furosemide (LASIX) 20 MG tablet Take 1 tablet (20 mg total) by mouth daily as needed (swelling).   melatonin 5 MG TABS Take 2.5 mg by mouth at bedtime.   simvastatin (ZOCOR) 20 MG tablet Take 20 mg by mouth daily.   vitamin C (ASCORBIC ACID) 500 MG tablet Take 500 mg by mouth daily.   [DISCONTINUED] spironolactone (ALDACTONE) 25 MG tablet Take 0.5 tablets (12.5 mg total) by mouth daily. Take half a pill once daily     Allergies:   Cefzil [cefprozil]   Social History   Socioeconomic History   Marital status: Married    Spouse name: Not on file   Number of children: Not on file   Years of education:  Not on file   Highest education level: Not on file  Occupational History   Not on file  Tobacco Use   Smoking status: Never   Smokeless tobacco: Never  Vaping Use   Vaping status: Never Used  Substance and Sexual Activity   Alcohol use: No    Alcohol/week: 0.0 standard drinks of alcohol   Drug use: No   Sexual activity: Not on file  Other Topics Concern   Not on file  Social History Narrative   Not on file   Social Drivers of Health   Financial Resource Strain: Not on file  Food Insecurity: Not on file  Transportation Needs: Not on file  Physical Activity: Not  on file  Stress: Not on file  Social Connections: Not on file     Family History: The patient's family history includes Arthritis in an other family member; Heart disease in an other family member; Osteoporosis in an other family member. There is no history of Colon cancer or Colon polyps.  Father lived to 6 years old, has an older sister 18 years old who is healthy, had another sister died at age 51 of a lung infection.  1 child (son), healthy.  ROS:   Please see the history of present illness. All other systems are reviewed and are negative.   EKGs/Labs/Other Studies Reviewed:    The following studies were reviewed today: Cardiac Cath 12/15/2020    LV end diastolic pressure is severely elevated.   Hemodynamic findings consistent with mild pulmonary hypertension.   SUMMARY Angiographically Normal Coronary arteries with a left dominant system. Significant LVOT gradient with apical LV pressures of 184/12 mmHg with an EDP of 25, mid LV cavity 157/9 mmHg with EDP of 25 and outflow tract pressure of 111/9 mmHg with a stable EDP of 24 mmHg. Mild Secondary Pulmonary Hypertension with mean PAP of 29 mmHg and PCWP of 29 mmHg. Moderately reduced cardiac function with a cardiac output-index of 3.26 and 2.02 by Fick and 3.55-2.2 by thermodilution.     RECOMMENDATIONS Gentle titration of medications to allow for diuresis  without leading to systemic hypotension. Defer decision making to rounding cardiology service   Echocardiogram 09/23/2022   1. Fair exercise capacity, achieved 6.6 METS.   2. Did not reach target HR. Peak HR 111 bpm (73% max age predicted HR).   3. Normal BP response to exercise.   4. Stress EKG not interpretable due to V-paced rhythm.   5. This is an inconclusive stress echocardiogram for ischemia.  Non-diagnostic study for ischemia due to inadequate heart rate response.   6. This is an indeterminate risk study.   7. No resting LVOT gradient. Peak gradient during stress measured  (measured with CW Doppler, unclear if location of obstruction was LVOT or  mid cavitary)   Echocardiogram 07/16/2018    1. Left ventricular ejection fraction, by estimation, is 60 to 65%. The  left ventricle has normal function. The left ventricle has no regional  wall motion abnormalities. moderate-severe asymmetric left ventricular  hypertrophy of the septal segment  (15-16 mm). No resting LVOT obstruction. With Valsalva, max instantaneous  gradient 10 mmHg (1.42 m/s). No systolic anterior motion of the mitral  valve at rest. Left ventricular diastolic parameters are consistent with  Grade I diastolic dysfunction  (impaired relaxation).   2. Right ventricular systolic function is normal. The right ventricular  size is normal. There is normal pulmonary artery systolic pressure. The  estimated right ventricular systolic pressure is 23.2 mmHg.   3. The mitral valve is normal in structure. Trivial mitral valve  regurgitation. No evidence of mitral stenosis.   4. The aortic valve is tricuspid. There is mild calcification of the  aortic valve. There is mild thickening of the aortic valve. Aortic valve  regurgitation is mild. No aortic stenosis is present.   5. The inferior vena cava is normal in size with greater than 50%  respiratory variability, suggesting right atrial pressure of 3 mmHg.    echocardiogram 08/27/2019   1. LVOT peak gradient 213 mmHg. Left ventricular ejection fraction, by estimation, is 70 to 75%. The left ventricle has hyperdynamic function. The left ventricle has no regional wall motion abnormalities. There  is mild concentric left ventricular hypertrophy. Left ventricular diastolic parameters are consistent with Grade I diastolic dysfunction (impaired relaxation). Elevated left atrial pressure. 2. Right ventricular systolic function is normal. The right ventricular size is normal. There is normal pulmonary artery systolic pressure. 3. The mitral valve is grossly normal. Trivial mitral valve regurgitation. 4. The aortic valve is tricuspid. Aortic valve regurgitation is mild. No aortic stenosis is present. 5. The inferior vena cava is normal in size with greater than 50% respiratory variability, suggesting right atrial pressure of 3 mmHg.     Echocardiogram 11/26/2018:   1. The left ventricle has a visually estimated ejection fraction of 75%.  The cavity size was normal. Left ventricular diastolic Doppler parameters  are consistent with impaired relaxation. Elevated left ventricular  end-diastolic pressure No evidence of  left ventricular regional wall motion abnormalities.   2. The right ventricle has normal systolic function. The cavity was  normal. There is no increase in right ventricular wall thickness.   3. The aortic valve is tricuspid. Mild thickening of the aortic valve.  Aortic valve regurgitation is mild by color flow Doppler. Mild aortic  annular calcification noted.   4. The mitral valve is abnormal. Moderate thickening of the mitral valve  leaflet. Mitral valve regurgitation is moderate by color flow Doppler.   5. Mitral chordal SAM is noted. LVOT is small. Peak gradient 145 mmHg.   6. The tricuspid valve is grossly normal. Tricuspid valve regurgitation  is moderate.   7. The aorta is normal unless otherwise noted.      Coronary CT  angiography 12/03/2018:  FINDINGS: Coronary calcium score: The patient's coronary artery calcium score is 149, which places the patient in the 85th percentile. Coronary arteries: Normal coronary origins.  Left dominance Right Coronary Artery: Small, non-dominant vessel which gives off a larger AV nodal branch. No obstructive disease. Left Main Coronary Artery: Prominent left coronary cusp, but not anuerymsal. No stenosis. Gives off larger LAD and LCx arteries. Left Anterior Descending Coronary Artery: Calcified proximal vessel with mild 25-49% (CADRADS2) mixed stenosis. Gives off several small diagonal branches. Ramus Intermedius Artery: Small branch without significant stenosis.   Left Circumflex Artery: Dominant vessel that gives off a small PDA artery. Larger caliber with minimal mixed 1-24% mid-vessel stenosis (CADRADS1).  Aorta: Normal size, 28 mm at the mid ascending aorta (level of the PA bifurcation) measured double oblique. Atherosclerosis. No dissection. Aortic Valve: Trileaflet.  Calcification of the annulus.   Other findings: Thickening of the aortic and mitral valve leaflets. Normal biatrial size Normal pulmonary vein drainage into the left atrium. Normal left atrial appendage without a thrombus. Normal size of the pulmonary artery.   IMPRESSION: 1. Mild, non-obstructive CAD primarily of the LAD, CADRADS = 2. 2. Coronary calcium score of 149. This was 85th percentile for age and sex matched control. 3. Normal coronary origin with left dominance. 4. Thickening of the aortic and mitral valve leaflets  MCOT monitor 07/14/2017 Sinus rhythm with frequent PVCs. There was one episode of rapid atrial fibrillation.   EKG: Personally reviewed most recent ECG tracing from November 2024 shows atrial ventricular sequential pacing with single PVC.  Broad QRS complex with a positive paced R wave in lead V1.   Recent Labs: 03/06/2023: BUN 15; Creatinine, Ser 0.91; Potassium 4.6; Sodium  144 07/23/2021 Hemoglobin 14.1, creatinine 0.82, potassium 4.4, ALT 21  Recent Lipid Panel No results found for: "CHOL", "TRIG", "HDL", "CHOLHDL", "VLDL", "LDLCALC", "LDLDIRECT" 10/07/2019 Total cholesterol 171, HDL 56, LDL 86,  triglycerides 145  10/10/2020 Cholesterol 164, HDL 48, triglycerides 178, LDL 86  11/05/2021 Cholesterol 180, HDL 51, triglycerides 136  11/07/2022 hemoglobin A1c 5.8%, hemoglobin 13.5  03/06/2023 Creatinine 0.91, potassium 4.6, TSH 2.774  Physical Exam:    VS:  BP 114/70 (BP Location: Left Arm, Patient Position: Sitting)   Pulse 64   Ht 5' (1.524 m)   Wt 131 lb (59.4 kg)   SpO2 95%   BMI 25.58 kg/m     Wt Readings from Last 3 Encounters:  06/19/23 131 lb (59.4 kg)  02/20/23 132 lb 12.8 oz (60.2 kg)  08/27/22 142 lb 6.4 oz (64.6 kg)     General: Alert, oriented x3, no distress, healthy subclavian pacemaker site.  She appears well. Head: no evidence of trauma, PERRL, EOMI, no exophtalmos or lid lag, no myxedema, no xanthelasma; normal ears, nose and oropharynx Neck: normal jugular venous pulsations and no hepatojugular reflux; brisk carotid pulses without delay and no carotid bruits Chest: clear to auscultation, no signs of consolidation by percussion or palpation, normal fremitus, symmetrical and full respiratory excursions Cardiovascular: normal position and quality of the apical impulse, regular rhythm, normal first and paradoxically second heart sounds, no murmurs, rubs or gallops.  There is no audible systolic murmur either at rest or with provocative maneuvers. Abdomen: no tenderness or distention, no masses by palpation, no abnormal pulsatility or arterial bruits, normal bowel sounds, no hepatosplenomegaly Extremities: no clubbing, cyanosis and there is only 1+ symmetrical ankle edema; 2+ radial, ulnar and brachial pulses bilaterally; 2+ right femoral, posterior tibial and dorsalis pedis pulses; 2+ left femoral, posterior tibial and dorsalis pedis  pulses; no subclavian or femoral bruits Neurological: grossly nonfocal Psych: Normal mood and affect  ASSESSMENT:    1. Primary hypertension   2. HOCM (hypertrophic obstructive cardiomyopathy) (HCC)   3. Chronic diastolic heart failure (HCC)   4. SSS (sick sinus syndrome) (HCC)   5. Pacemaker   6. Paroxysmal atrial fibrillation (HCC)   7. Acquired thrombophilia (HCC)   8. Nonobstructive atherosclerosis of coronary artery   9. Hypercholesterolemia   10. Prediabetes        PLAN:    In order of problems listed above:  HOCM: No audible murmur at rest or with provocative maneuver.  No inducible gradient with stress echo.  Has responded very well to beta-blockers (may be some benefit of RV apical pacing). CHF: Remains NYHA functional class II with complaints of fatigue rather than dyspnea.  Has some ankle edema.  Is on Jardiance.  Has not added spironolactone.  Will start that today. SSS: Heart rate histograms reviewed and appears appropriate.  I do not think additional changes to her rate response settings would make a difference. Pacemaker: Normal device function.  Recheck via remote downloads every 3 months. HTN:   Excellent control on bisoprolol.  Avoid loop diuretics and potent vasodilators.  I agree with adding spironolactone for her edema.  Blood pressure should be able to tolerate it. AFib: Very low arrhythmia burden.  We really have not seen any atrial fibrillation in a couple of years.  s.  On appropriate anticoagulation. CHADSVasc 4 (age, gender, HTN, +/- CAD), but this score is probably irrelevant in setting of HOCM. Anticoagulation: No recent bleeding problems.  Treatment with Eliquis actually led to early diagnosis of colon cancer which has been subsequently surgically treated. CAD: She does not have angina pectoris.  She did not have any angiographically significant coronary stenoses at catheterization but has significant atherosclerotic plaque burden on  CT.  HLP: Recheck  lipid profile which has not been checked in almost 2 years and we will reevaluate her hemoglobin A1c as well 10.  Prediabetes: Hemoglobin A1c was very slightly elevated at 5.8% in August.  I wonder whether with addition of Jardiance that has not normalized.  Will check that today.    Medication Adjustments/Labs and Tests Ordered: Current medicines are reviewed at length with the patient today.  Concerns regarding medicines are outlined above.  Orders Placed This Encounter  Procedures   Comprehensive metabolic panel   Hemoglobin A1c   Lipid panel   Magnesium    Meds ordered this encounter  Medications   spironolactone (ALDACTONE) 25 MG tablet    Sig: Take 0.5 tablets (12.5 mg total) by mouth daily. Take half a pill once daily    Dispense:  45 tablet    Refill:  3     Patient Instructions  Medication Instructions:  Your physician has recommended you make the following change in your medication:   Restart Spironolactone 12.5mg  once daily   *If you need a refill on your cardiac medications before your next appointment, please call your pharmacy*   Lab Work: Labs today- Lipid panel, A1c, mag, and CMP   Follow-Up: At [facility], you and your health needs are our priority.  As part of our continuing mission to provide you with exceptional heart care, we have created designated Provider Care Teams.  These Care Teams include your primary Cardiologist (physician) and Advanced Practice Providers (APPs -  Physician Assistants and Nurse Practitioners) who all work together to provide you with the care you need, when you need it.  We recommend signing up for the patient portal called "MyChart".  Sign up information is provided on this After Visit Summary.  MyChart is used to connect with patients for Virtual Visits (Telemedicine).  Patients are able to view lab/test results, encounter notes, upcoming appointments, etc.  Non-urgent messages can be sent to your provider as well.    To learn more about what you can do with MyChart, go to [email or web address].    Your next appointment:   1 year(s)  Provider:   Thurmon Fair, MD     Other Instructions   1st Floor: - Lobby - Registration  - Pharmacy  - Lab - Cafe  2nd Floor: - PV Lab - Diagnostic Testing (echo, CT, nuclear med)  3rd Floor: - Vacant  4th Floor: - TCTS (cardiothoracic surgery) - AFib Clinic - Structural Heart Clinic - Vascular Surgery  - Vascular Ultrasound  5th Floor: - HeartCare Cardiology (general and EP) - Clinical Pharmacy for coumadin, hypertension, lipid, weight-loss medications, and med management appointments    Valet parking services will be available as well.         Signed, Thurmon Fair, MD  06/19/2023 1:23 PM    [facility]

## 2023-06-19 NOTE — Patient Instructions (Signed)
 Medication Instructions:  Your physician has recommended you make the following change in your medication:   Restart Spironolactone 12.5mg  once daily   *If you need a refill on your cardiac medications before your next appointment, please call your pharmacy*   Lab Work: Labs today- Lipid panel, A1c, mag, and CMP   Follow-Up: At [facility], you and your health needs are our priority.  As part of our continuing mission to provide you with exceptional heart care, we have created designated Provider Care Teams.  These Care Teams include your primary Cardiologist (physician) and Advanced Practice Providers (APPs -  Physician Assistants and Nurse Practitioners) who all work together to provide you with the care you need, when you need it.  We recommend signing up for the patient portal called "MyChart".  Sign up information is provided on this After Visit Summary.  MyChart is used to connect with patients for Virtual Visits (Telemedicine).  Patients are able to view lab/test results, encounter notes, upcoming appointments, etc.  Non-urgent messages can be sent to your provider as well.   To learn more about what you can do with MyChart, go to [email or web address].    Your next appointment:   1 year(s)  Provider:   Thurmon Fair, MD     Other Instructions   1st Floor: - Lobby - Registration  - Pharmacy  - Lab - Cafe  2nd Floor: - PV Lab - Diagnostic Testing (echo, CT, nuclear med)  3rd Floor: - Vacant  4th Floor: - TCTS (cardiothoracic surgery) - AFib Clinic - Structural Heart Clinic - Vascular Surgery  - Vascular Ultrasound  5th Floor: - HeartCare Cardiology (general and EP) - Clinical Pharmacy for coumadin, hypertension, lipid, weight-loss medications, and med management appointments    Valet parking services will be available as well.

## 2023-06-20 ENCOUNTER — Encounter: Payer: Self-pay | Admitting: Cardiovascular Disease

## 2023-06-20 LAB — COMPREHENSIVE METABOLIC PANEL
ALT: 22 IU/L (ref 0–32)
AST: 25 IU/L (ref 0–40)
Albumin: 4.8 g/dL (ref 3.9–4.9)
Alkaline Phosphatase: 67 IU/L (ref 44–121)
BUN/Creatinine Ratio: 14 (ref 12–28)
BUN: 14 mg/dL (ref 8–27)
Bilirubin Total: 0.9 mg/dL (ref 0.0–1.2)
CO2: 25 mmol/L (ref 20–29)
Calcium: 10.9 mg/dL — ABNORMAL HIGH (ref 8.7–10.3)
Chloride: 104 mmol/L (ref 96–106)
Creatinine, Ser: 0.99 mg/dL (ref 0.57–1.00)
Globulin, Total: 2 g/dL (ref 1.5–4.5)
Glucose: 84 mg/dL (ref 70–99)
Potassium: 4.5 mmol/L (ref 3.5–5.2)
Sodium: 143 mmol/L (ref 134–144)
Total Protein: 6.8 g/dL (ref 6.0–8.5)
eGFR: 61 mL/min/{1.73_m2} (ref >59)

## 2023-06-20 LAB — MAGNESIUM: Magnesium: 2.2 mg/dL (ref 1.6–2.3)

## 2023-06-20 LAB — LIPID PANEL
Chol/HDL Ratio: 2.9 ratio (ref 0.0–4.4)
Cholesterol, Total: 161 mg/dL (ref 100–199)
HDL: 56 mg/dL (ref >39)
LDL Chol Calc (NIH): 81 mg/dL (ref 0–99)
Triglycerides: 135 mg/dL (ref 0–149)
VLDL Cholesterol Cal: 24 mg/dL (ref 5–40)

## 2023-06-20 LAB — HEMOGLOBIN A1C
Est. average glucose Bld gHb Est-mCnc: 117 mg/dL
Hgb A1c MFr Bld: 5.7 % — ABNORMAL HIGH (ref 4.8–5.6)

## 2023-07-03 ENCOUNTER — Ambulatory Visit (INDEPENDENT_AMBULATORY_CARE_PROVIDER_SITE_OTHER): Payer: Medicare PPO

## 2023-07-03 DIAGNOSIS — I421 Obstructive hypertrophic cardiomyopathy: Secondary | ICD-10-CM

## 2023-07-03 LAB — CUP PACEART REMOTE DEVICE CHECK
Battery Remaining Longevity: 90 mo
Battery Remaining Percentage: 91 %
Brady Statistic RA Percent Paced: 91 %
Brady Statistic RV Percent Paced: 84 %
Date Time Interrogation Session: 20250403021200
Implantable Lead Connection Status: 753985
Implantable Lead Connection Status: 753985
Implantable Lead Implant Date: 20211129
Implantable Lead Implant Date: 20211129
Implantable Lead Location: 753859
Implantable Lead Location: 753860
Implantable Lead Model: 7840
Implantable Lead Model: 7841
Implantable Lead Serial Number: 1036898
Implantable Lead Serial Number: 1100104
Implantable Pulse Generator Implant Date: 20211129
Lead Channel Impedance Value: 716 Ohm
Lead Channel Impedance Value: 883 Ohm
Lead Channel Pacing Threshold Amplitude: 0.5 V
Lead Channel Pacing Threshold Amplitude: 0.6 V
Lead Channel Pacing Threshold Pulse Width: 0.4 ms
Lead Channel Pacing Threshold Pulse Width: 0.4 ms
Lead Channel Setting Pacing Amplitude: 3.5 V
Lead Channel Setting Pacing Amplitude: 3.5 V
Lead Channel Setting Pacing Pulse Width: 0.4 ms
Lead Channel Setting Sensing Sensitivity: 2.5 mV
Pulse Gen Serial Number: 951099
Zone Setting Status: 755011

## 2023-07-10 DIAGNOSIS — E7800 Pure hypercholesterolemia, unspecified: Secondary | ICD-10-CM | POA: Diagnosis not present

## 2023-07-10 DIAGNOSIS — Z131 Encounter for screening for diabetes mellitus: Secondary | ICD-10-CM | POA: Diagnosis not present

## 2023-07-10 DIAGNOSIS — R5383 Other fatigue: Secondary | ICD-10-CM | POA: Diagnosis not present

## 2023-07-10 DIAGNOSIS — E7801 Familial hypercholesterolemia: Secondary | ICD-10-CM | POA: Diagnosis not present

## 2023-07-10 DIAGNOSIS — I10 Essential (primary) hypertension: Secondary | ICD-10-CM | POA: Diagnosis not present

## 2023-07-10 DIAGNOSIS — E039 Hypothyroidism, unspecified: Secondary | ICD-10-CM | POA: Diagnosis not present

## 2023-07-17 DIAGNOSIS — I421 Obstructive hypertrophic cardiomyopathy: Secondary | ICD-10-CM | POA: Diagnosis not present

## 2023-07-17 DIAGNOSIS — Z6823 Body mass index (BMI) 23.0-23.9, adult: Secondary | ICD-10-CM | POA: Diagnosis not present

## 2023-07-17 DIAGNOSIS — N393 Stress incontinence (female) (male): Secondary | ICD-10-CM | POA: Diagnosis not present

## 2023-07-17 DIAGNOSIS — E039 Hypothyroidism, unspecified: Secondary | ICD-10-CM | POA: Diagnosis not present

## 2023-07-17 DIAGNOSIS — I10 Essential (primary) hypertension: Secondary | ICD-10-CM | POA: Diagnosis not present

## 2023-08-13 NOTE — Progress Notes (Signed)
 Remote pacemaker transmission.

## 2023-09-10 ENCOUNTER — Other Ambulatory Visit: Payer: Self-pay | Admitting: Cardiovascular Disease

## 2023-09-10 DIAGNOSIS — I480 Paroxysmal atrial fibrillation: Secondary | ICD-10-CM

## 2023-09-10 DIAGNOSIS — Z7901 Long term (current) use of anticoagulants: Secondary | ICD-10-CM

## 2023-09-10 NOTE — Telephone Encounter (Signed)
 Eliquis  5mg  refill request received. Patient is 73 years old, weight-59.4kg, Crea-0.99 on 06/19/23, Diagnosis-Afib, and last seen by Dr. Alvis Ba on 06/19/23. Dose is appropriate based on dosing criteria. Will send in refill to requested pharmacy.

## 2023-09-11 NOTE — Patient Instructions (Signed)
 Below is our plan:  We will continue melatonin for sleep aid. Consider L-theonine as well if ok with your PCP.  I would encourage continued mental activity and increased physical activity. Consider formal neurocognitive testing if you wish.   Please make sure you are staying well hydrated. I recommend 50-60 ounces daily. Well balanced diet and regular exercise encouraged. Consistent sleep schedule with 6-8 hours recommended.   Please continue follow up with care team as directed.   Follow up with me in 6 months   You may receive a survey regarding today's visit. I encourage you to leave honest feed back as I do use this information to improve patient care. Thank you for seeing me today!   Management of Memory Problems   There are some general things you can do to help manage your memory problems.  Your memory may not in fact recover, but by using techniques and strategies you will be able to manage your memory difficulties better.   1)  Establish a routine. Try to establish and then stick to a regular routine.  By doing this, you will get used to what to expect and you will reduce the need to rely on your memory.  Also, try to do things at the same time of day, such as taking your medication or checking your calendar first thing in the morning. Think about think that you can do as a part of a regular routine and make a list.  Then enter them into a daily planner to remind you.  This will help you establish a routine.   2)  Organize your environment. Organize your environment so that it is uncluttered.  Decrease visual stimulation.  Place everyday items such as keys or cell phone in the same place every day (ie.  Basket next to front door) Use post it notes with a brief message to yourself (ie. Turn off light, lock the door) Use labels to indicate where things go (ie. Which cupboards are for food, dishes, etc.) Keep a notepad and pen by the telephone to take messages   3)  Memory Aids A  diary or journal/notebook/daily planner Making a list (shopping list, chore list, to do list that needs to be done) Using an alarm as a reminder (kitchen timer or cell phone alarm) Using cell phone to store information (Notes, Calendar, Reminders) Calendar/White board placed in a prominent position Post-it notes   In order for memory aids to be useful, you need to have good habits.  It's no good remembering to make a note in your journal if you don't remember to look in it.  Try setting aside a certain time of day to look in journal.   4)  Improving mood and managing fatigue. There may be other factors that contribute to memory difficulties.  Factors, such as anxiety, depression and tiredness can affect memory. Regular gentle exercise can help improve your mood and give you more energy. Exercise: there are short videos created by the [employer] on Health specially for older adults: https://bit.ly/2I30q97.  Mediterranean diet: which emphasizes fruits, vegetables, whole grains, legumes, fish, and other seafood; unsaturated fats such as olive oils; and low amounts of red meat, eggs, and sweets. A variation of this, called MIND (Mediterranean-DASH Intervention for Neurodegenerative Delay) incorporates the DASH (Dietary Approaches to Stop Hypertension) diet, which has been shown to lower high blood pressure, a risk factor for Alzheimer's disease. More information at: ExitMarketing.de.  Aerobic exercise that improve heart health is also good  for the mind.  [employer] on Aging have short videos for exercises that you can do at home: BlindWorkshop.com.pt Simple relaxation techniques may help relieve symptoms of anxiety Try to get back to completing activities or hobbies you enjoyed doing in the past. Learn to pace yourself through activities to decrease fatigue. Find out about some local support groups where you  can share experiences with others. Try and achieve 7-8 hours of sleep at night.   Tasks to improve attention/working memory 1. Good sleep hygiene (7-8 hrs of sleep) 2. Learning a new skill (Painting, Carpentry, Pottery, new language, Knitting). 3.Cognitive exercises (keep a daily journal, Puzzles) 4. Physical exercise and training  (30 min/day X 4 days week) 5. Being on Antidepressant if needed 6.Yoga, Meditation, Tai Chi 7. Decrease alcohol  intake 8.Have a clear schedule and structure in daily routine   MIND Diet: The Mediterranean-DASH Diet Intervention for Neurodegenerative Delay, or MIND diet, targets the health of the aging brain. Research participants with the highest MIND diet scores had a significantly slower rate of cognitive decline compared with those with the lowest scores. The effects of the MIND diet on cognition showed greater effects than either the Mediterranean or the DASH diet alone.   The healthy items the MIND diet guidelines suggest include:   3+ servings a day of whole grains 1+ servings a day of vegetables (other than green leafy) 6+ servings a week of green leafy vegetables 5+ servings a week of nuts 4+ meals a week of beans 2+ servings a week of berries 2+ meals a week of poultry 1+ meals a week of fish Mainly olive oil if added fat is used   The unhealthy items, which are higher in saturated and trans fat, include: Less than 5 servings a week of pastries and sweets Less than 4 servings a week of red meat (including beef, pork, lamb, and products made from these meats) Less than one serving a week of cheese and fried foods Less than 1 tablespoon a day of butter/stick margarine

## 2023-09-11 NOTE — Progress Notes (Signed)
 Chief Complaint  Patient presents with   Follow-up    Pt in room 1. Mr.Ybarbo in room. Here for memory follow up. Pt reports short term is not well, reports it maybe slightly worse then last visit. MMSE:27    HISTORY OF PRESENT ILLNESS:  09/16/23 ALL:  Alexandria French is a 73 y.o. female here today for follow up for MCI. She was seen in consult with Dr Albertina Hugger 03/2023. MMSE 28/30. She was referred to neuropsychology. pTau was elevated but ratio normal. APOE positive for two copied of E4. MRI normal. She was advised to discontinue use of Benadryl and start melatonin.   Since, she reports doing fairly well. She continues to note difficulty with short term memory loss. She has difficulty with recall. She feels that symptoms are mostly stable from previous visit. She did not see neuropsychology. She did stop use of Benadryl. Melatonin helps a little bit. She denies concerns of sleep apnea. She is independent with ADLs. She is able to drive short, local distances without difficulty. She manages medications independently. She helps manage home and finances. She lives with her husband.    HISTORY (copied from Dr Dohmeier's previous note)  HPI:  Alexandria French is a 73 y.o. female and seen here upon referral from Dr. Broadus Canes for a Consultation/ Evaluation of memory loss.   This patient reports gradual onset of STM loss and word finding problems  over a period of 12-24 months.    Family  history, mother died of cancer at age 68,  father died at 76 and was in the end feeble. Maternal grandfather died at 49? With memory problems.    Maternal aunts  with cancer but no dementia history   Social history : married with adult child, Goble Last , now 2 .    HS graduate - no college experience. I worked at [employer] in [place] and then switched to [employer].  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   REVIEW OF SYSTEMS: Out of a complete 14 system review of symptoms, the patient complains only of  the following symptoms, memory loss, left sided hearing loss and all other reviewed systems are negative.   ALLERGIES: Allergies  Allergen Reactions   Cefzil [Cefprozil] Other (See Comments)    angioedema     HOME MEDICATIONS: Outpatient Medications Prior to Visit  Medication Sig Dispense Refill   acetaminophen  (TYLENOL ) 500 MG tablet Take 2 tablets (1,000 mg total) by mouth every 6 (six) hours as needed for fever or mild pain. 30 tablet 0   augmented betamethasone dipropionate (DIPROLENE-AF) 0.05 % cream Apply topically as needed.     bisoprolol  (ZEBETA ) 10 MG tablet Take 10 mg every morning and 5 mg every evening 135 tablet 3   Calcium  Carbonate-Vitamin D  600-400 MG-UNIT tablet Take 1 tablet by mouth 2 (two) times daily.      cetirizine (ZYRTEC) 10 MG tablet Take 10 mg by mouth every evening.      Coenzyme Q10 200 MG capsule Take 200 mg by mouth daily.     diltiazem  (CARDIZEM ) 30 MG tablet Take 1 tablet (30 mg total) by mouth 3 (three) times daily as needed (palpitations). 30 tablet 3   diphenhydrAMINE (BENADRYL) 25 MG tablet Take 12.5 mg by mouth at bedtime.     ELIQUIS  5 MG TABS tablet TAKE 1 TABLET BY MOUTH TWICE A DAY 180 tablet 1   empagliflozin  (JARDIANCE ) 10 MG TABS tablet Take 1 tablet (10 mg total) by mouth  daily before breakfast. 90 tablet 3   empagliflozin  (JARDIANCE ) 10 MG TABS tablet Take 1 tablet (10 mg total) by mouth daily before breakfast. 28 tablet 0   ferrous sulfate  325 (65 FE) MG EC tablet Take 325 mg by mouth at bedtime.     furosemide  (LASIX ) 20 MG tablet Take 1 tablet (20 mg total) by mouth daily as needed (swelling). 30 tablet 11   melatonin 5 MG TABS Take 2.5 mg by mouth at bedtime.     simvastatin  (ZOCOR ) 20 MG tablet Take 20 mg by mouth daily.     spironolactone  (ALDACTONE ) 25 MG tablet Take 0.5 tablets (12.5 mg total) by mouth daily. Take half a pill once daily 45 tablet 3   vitamin C (ASCORBIC ACID ) 500 MG tablet Take 500 mg by mouth daily.     No  facility-administered medications prior to visit.     PAST MEDICAL HISTORY: Past Medical History:  Diagnosis Date   A-fib [facility])    CAD (coronary artery disease)    Colon adenocarcinoma (HCC)    resected no other treatment   Complication of anesthesia    sometimes BP flucuates   Diverticulosis    Esophageal reflux    no meds   External hemorrhoids    Gastritis    Hypertension    IDA (iron  deficiency anemia)    Internal hemorrhoids    Other and unspecified hyperlipidemia    Presence of permanent cardiac pacemaker 2021   bost sci   SSS (sick sinus syndrome) (HCC)      PAST SURGICAL HISTORY: Past Surgical History:  Procedure Laterality Date   ABDOMINAL HYSTERECTOMY  08/2008   BIOPSY  12/20/2020   Procedure: BIOPSY;  Surgeon: Kenney Peacemaker, MD;  Location: [facility] ENDOSCOPY;  Service: Endoscopy;;   BIOPSY  01/03/2022   Procedure: BIOPSY;  Surgeon: Nannette Babe, MD;  Location: [facility] ENDOSCOPY;  Service: Gastroenterology;;   BLADDER REPAIR     COLONOSCOPY WITH PROPOFOL  N/A 12/20/2020   Procedure: COLONOSCOPY WITH PROPOFOL ;  Surgeon: Kenney Peacemaker, MD;  Location: [facility] ENDOSCOPY;  Service: Endoscopy;  Laterality: N/A;   COLONOSCOPY WITH PROPOFOL  N/A 01/03/2022   Procedure: COLONOSCOPY WITH PROPOFOL ;  Surgeon: Nannette Babe, MD;  Location: [facility] ENDOSCOPY;  Service: Gastroenterology;  Laterality: N/A;   HEMOSTASIS CLIP PLACEMENT  01/03/2022   Procedure: HEMOSTASIS CLIP PLACEMENT;  Surgeon: Nannette Babe, MD;  Location: [facility] ENDOSCOPY;  Service: Gastroenterology;;   LAPAROSCOPIC PARTIAL COLECTOMY N/A 12/22/2020   Procedure: LAPAROSCOPIC ASSISTED PARTIAL COLECTOMY;  Surgeon: Oza Blumenthal, MD;  Location: [facility] OR;  Service: General;  Laterality: N/A;   PACEMAKER IMPLANT N/A 02/28/2020   Procedure: PACEMAKER IMPLANT;  Surgeon: Luana Rumple, MD;  Location: [facility] INVASIVE CV LAB;  Service: Cardiovascular;  Laterality: N/A;   POLYPECTOMY  01/03/2022   Procedure: POLYPECTOMY;  Surgeon: Nannette Babe,  MD;  Location: [facility] ENDOSCOPY;  Service: Gastroenterology;;   RIGHT/LEFT HEART CATH AND CORONARY ANGIOGRAPHY N/A 12/15/2020   Procedure: RIGHT/LEFT HEART CATH AND CORONARY ANGIOGRAPHY;  Surgeon: Arleen Lacer, MD;  Location: [facility] INVASIVE CV LAB;  Service: Cardiovascular;  Laterality: N/A;   TONSILLECTOMY       FAMILY HISTORY: Family History  Problem Relation Age of Onset   Heart disease Other    Osteoporosis Other    Arthritis Other    Colon cancer Neg Hx    Colon polyps Neg Hx      SOCIAL HISTORY: Social History   Socioeconomic History   Marital status: Married  Spouse name: Not on file   Number of children: Not on file   Years of education: Not on file   Highest education level: Not on file  Occupational History   Not on file  Tobacco Use   Smoking status: Never   Smokeless tobacco: Never  Vaping Use   Vaping status: Never Used  Substance and Sexual Activity   Alcohol  use: No    Alcohol /week: 0.0 standard drinks of alcohol    Drug use: No   Sexual activity: Not on file  Other Topics Concern   Not on file  Social History Narrative   Not on file   Social Drivers of Health   Financial Resource Strain: Not on file  Food Insecurity: Not on file  Transportation Needs: Not on file  Physical Activity: Not on file  Stress: Not on file  Social Connections: Not on file  Intimate Partner Violence: Not on file     PHYSICAL EXAM  Vitals:   09/16/23 1452  BP: 113/64  Pulse: 60  SpO2: 96%  Weight: 132 lb (59.9 kg)  Height: 5' 2 (1.575 m)   Body mass index is 24.14 kg/m.  Generalized: Well developed, in no acute distress  Cardiology: normal rate and rhythm, no murmur auscultated  Respiratory: clear to auscultation bilaterally    Neurological examination  Mentation: Alert oriented to time, place, history taking. Follows all commands speech and language fluent Cranial nerve II-XII: Pupils were equal round reactive to light. Extraocular movements were full,  visual field were full on confrontational test. Facial sensation and strength were normal. Uvula tongue midline. Head turning and shoulder shrug  were normal and symmetric. Motor: The motor testing reveals 5 over 5 strength of all 4 extremities. Good symmetric motor tone is noted throughout.   Gait and station: Gait is normal.    DIAGNOSTIC DATA (LABS, IMAGING, TESTING) - I reviewed patient records, labs, notes, testing and imaging myself where available.  Lab Results  Component Value Date   WBC 5.3 01/07/2022   HGB 13.3 01/07/2022   HCT 39.2 01/07/2022   MCV 92.5 01/07/2022   PLT 196 01/07/2022      Component Value Date/Time   NA 143 06/19/2023 1106   K 4.5 06/19/2023 1106   CL 104 06/19/2023 1106   CO2 25 06/19/2023 1106   GLUCOSE 84 06/19/2023 1106   GLUCOSE 93 01/07/2022 0952   BUN 14 06/19/2023 1106   CREATININE 0.99 06/19/2023 1106   CREATININE 0.89 01/07/2022 0952   CALCIUM  10.9 (H) 06/19/2023 1106   PROT 6.8 06/19/2023 1106   ALBUMIN 4.8 06/19/2023 1106   AST 25 06/19/2023 1106   AST 25 01/07/2022 0952   ALT 22 06/19/2023 1106   ALT 22 01/07/2022 0952   ALKPHOS 67 06/19/2023 1106   BILITOT 0.9 06/19/2023 1106   BILITOT 0.7 01/07/2022 0952   GFRNONAA >60 01/07/2022 0952   GFRAA 88 02/16/2020 1056   Lab Results  Component Value Date   CHOL 161 06/19/2023   HDL 56 06/19/2023   LDLCALC 81 06/19/2023   TRIG 135 06/19/2023   CHOLHDL 2.9 06/19/2023   Lab Results  Component Value Date   HGBA1C 5.7 (H) 06/19/2023   Lab Results  Component Value Date   VITAMINB12 846 12/19/2020   No results found for: TSH     09/16/2023    2:55 PM 03/10/2023    2:04 PM  MMSE - Mini Mental State Exam  Orientation to time 3 4  Orientation to Place  5 5  Registration 3 3  Attention/ Calculation 5 5  Recall 3 3  Language- name 2 objects 2 2  Language- repeat 1 1  Language- follow 3 step command 3 3  Language- read & follow direction 1 0  Write a sentence 1 1  Copy  design 0 1  Total score 27 28         No data to display           ASSESSMENT AND PLAN  73 y.o. year old female  has a past medical history of A-fib (HCC), CAD (coronary artery disease), Colon adenocarcinoma (HCC), Complication of anesthesia, Diverticulosis, Esophageal reflux, External hemorrhoids, Gastritis, Hypertension, IDA (iron  deficiency anemia), Internal hemorrhoids, Other and unspecified hyperlipidemia, Presence of permanent cardiac pacemaker (2021), and SSS (sick sinus syndrome) (HCC). here with    Mild cognitive impairment of uncertain or unknown etiology  Kimmy Totten reports memory is fairly stable. MMSE 27/30. We have reviewed labs, CT and MRI results. I have encouraged her remain mentally active and increase physical activity. Consider neurocognitive evaluation if she wishes. Reviewed memantine/donepezil and new infusion options for management of neurodegenerative disease. She will follow up with PCP as directed. She will return to see me in 6 months, sooner if needed. She verbalizes understanding and agreement with this plan.   No orders of the defined types were placed in this encounter.    No orders of the defined types were placed in this encounter.  I personally spent a total of 45 minutes in the care of the patient today including preparing to see the patient, getting/reviewing separately obtained history, performing a medically appropriate exam/evaluation, counseling and educating, placing orders, documenting clinical information in the EHR, independently interpreting results, and communicating results.   Terrilyn Fick, MSN, FNP-C 09/16/2023, 3:50 PM  [facility] [address], [address] [place], [place] [address] [phone]

## 2023-09-16 ENCOUNTER — Encounter: Payer: Self-pay | Admitting: Family Medicine

## 2023-09-16 ENCOUNTER — Ambulatory Visit: Payer: Medicare PPO | Admitting: Family Medicine

## 2023-09-16 VITALS — BP 113/64 | HR 60 | Ht 62.0 in | Wt 132.0 lb

## 2023-09-16 DIAGNOSIS — G3184 Mild cognitive impairment, so stated: Secondary | ICD-10-CM

## 2023-10-02 ENCOUNTER — Ambulatory Visit: Payer: Medicare PPO

## 2023-10-02 ENCOUNTER — Ambulatory Visit: Payer: Self-pay | Admitting: Cardiovascular Disease

## 2023-10-02 DIAGNOSIS — I421 Obstructive hypertrophic cardiomyopathy: Secondary | ICD-10-CM

## 2023-10-02 LAB — CUP PACEART REMOTE DEVICE CHECK
Battery Remaining Longevity: 90 mo
Battery Remaining Percentage: 88 %
Brady Statistic RA Percent Paced: 94 %
Brady Statistic RV Percent Paced: 90 %
Date Time Interrogation Session: 20250703025000
Implantable Lead Connection Status: 753985
Implantable Lead Connection Status: 753985
Implantable Lead Implant Date: 20211129
Implantable Lead Implant Date: 20211129
Implantable Lead Location: 753859
Implantable Lead Location: 753860
Implantable Lead Model: 7840
Implantable Lead Model: 7841
Implantable Lead Serial Number: 1036898
Implantable Lead Serial Number: 1100104
Implantable Pulse Generator Implant Date: 20211129
Lead Channel Impedance Value: 733 Ohm
Lead Channel Impedance Value: 837 Ohm
Lead Channel Pacing Threshold Amplitude: 0.5 V
Lead Channel Pacing Threshold Amplitude: 0.7 V
Lead Channel Pacing Threshold Pulse Width: 0.4 ms
Lead Channel Pacing Threshold Pulse Width: 0.4 ms
Lead Channel Setting Pacing Amplitude: 3.5 V
Lead Channel Setting Pacing Amplitude: 3.5 V
Lead Channel Setting Pacing Pulse Width: 0.4 ms
Lead Channel Setting Sensing Sensitivity: 2.5 mV
Pulse Gen Serial Number: 951099
Zone Setting Status: 755011

## 2023-10-06 ENCOUNTER — Telehealth: Payer: Self-pay | Admitting: Internal Medicine

## 2023-10-06 NOTE — Telephone Encounter (Signed)
Left the pt a message to call the office back. 

## 2023-10-06 NOTE — Telephone Encounter (Signed)
 Patient wants a call back from RN Shamea regarding the Health Well grant.

## 2023-10-09 ENCOUNTER — Telehealth: Payer: Self-pay

## 2023-10-09 NOTE — Telephone Encounter (Signed)
 Patient Advocate Encounter   The patient was approved for a Healthwell grant that will help cover the cost of JARDIANCE  GRANT IS ACTIVE  BALANCE $0265.68  Effective: 02/11/23 - 02/10/24   APW:[id number] ERW:[id number] Group:[id number] PI:[id number]  HW ID NUMBER [id number]    Alexandria French, CPhT  Pharmacy Patient Advocate Specialist  Direct Number: [phone] Fax: [phone]'

## 2023-10-29 ENCOUNTER — Telehealth: Payer: Self-pay | Admitting: Cardiovascular Disease

## 2023-10-29 ENCOUNTER — Other Ambulatory Visit (HOSPITAL_COMMUNITY): Payer: Self-pay

## 2023-10-29 MED ORDER — SPIRONOLACTONE 25 MG PO TABS
25.0000 mg | ORAL_TABLET | Freq: Every day | ORAL | 3 refills | Status: DC
  Filled 2023-10-29: qty 90, 90d supply, fill #0

## 2023-10-29 NOTE — Telephone Encounter (Signed)
 Pt c/o swelling/edema: STAT if pt has developed SOB within 24 hours  If swelling, where is the swelling located? Ankles   How much weight have you gained and in what time span? States she hasn't gained any weight   Have you gained 2 pounds in a day or 5 pounds in a week? No   Do you have a log of your daily weights (if so, list)? Did not write down   Are you currently taking a fluid pill? Taking half a pill, asked if she can take whole pill (spironolactone )   Are you currently SOB? No   Have you traveled recently in a car or plane for an extended period of time? No

## 2023-10-29 NOTE — Telephone Encounter (Signed)
 Called patient's husband with Dr. Francyne advisement. He will have patient take full dose of spirolactone 25 mg by mouth daily. He will have patient take lasix  as needed for swelling.

## 2023-10-29 NOTE — Telephone Encounter (Signed)
 Called patient's husband back about message. He stated patient has had bilateral ankle swelling for over a week. BP 125/61 and HR 61. He was wondering if is okay to take a whole tablet of spirolactone 25 mg, current dose 12.5 mg daily. Last CMET in March Potassium was 4.5 and creatinine 0.99. Will forward to Dr. Francyne for advisement.

## 2023-10-29 NOTE — Telephone Encounter (Signed)
 Yes, it is okay to take the full dose of spironolactone , but this will work very slowly.  On the list there is also a prescription for as needed furosemide  20 mg once daily.  She can occasionally take that as well. It may suffice to just do furosemide  20 mg + spironolactone  25 mg for one day.

## 2023-11-10 ENCOUNTER — Other Ambulatory Visit (HOSPITAL_COMMUNITY): Payer: Self-pay

## 2023-11-18 DIAGNOSIS — Z6823 Body mass index (BMI) 23.0-23.9, adult: Secondary | ICD-10-CM | POA: Diagnosis not present

## 2023-11-18 DIAGNOSIS — R0789 Other chest pain: Secondary | ICD-10-CM | POA: Diagnosis not present

## 2024-01-01 ENCOUNTER — Ambulatory Visit: Payer: Medicare PPO

## 2024-01-01 DIAGNOSIS — I480 Paroxysmal atrial fibrillation: Secondary | ICD-10-CM | POA: Diagnosis not present

## 2024-01-01 LAB — CUP PACEART REMOTE DEVICE CHECK
Battery Remaining Longevity: 90 mo
Battery Remaining Percentage: 85 %
Brady Statistic RA Percent Paced: 96 %
Brady Statistic RV Percent Paced: 94 %
Date Time Interrogation Session: 20251002043400
Implantable Lead Connection Status: 753985
Implantable Lead Connection Status: 753985
Implantable Lead Implant Date: 20211129
Implantable Lead Implant Date: 20211129
Implantable Lead Location: 753859
Implantable Lead Location: 753860
Implantable Lead Model: 7840
Implantable Lead Model: 7841
Implantable Lead Serial Number: 1036898
Implantable Lead Serial Number: 1100104
Implantable Pulse Generator Implant Date: 20211129
Lead Channel Impedance Value: 717 Ohm
Lead Channel Impedance Value: 820 Ohm
Lead Channel Pacing Threshold Amplitude: 0.6 V
Lead Channel Pacing Threshold Amplitude: 0.7 V
Lead Channel Pacing Threshold Pulse Width: 0.4 ms
Lead Channel Pacing Threshold Pulse Width: 0.4 ms
Lead Channel Setting Pacing Amplitude: 3.5 V
Lead Channel Setting Pacing Amplitude: 3.5 V
Lead Channel Setting Pacing Pulse Width: 0.4 ms
Lead Channel Setting Sensing Sensitivity: 2.5 mV
Pulse Gen Serial Number: 951099
Zone Setting Status: 755011

## 2024-01-02 ENCOUNTER — Ambulatory Visit: Payer: Self-pay | Admitting: Cardiovascular Disease

## 2024-01-05 NOTE — Progress Notes (Signed)
 Remote PPM Transmission

## 2024-01-09 NOTE — Progress Notes (Signed)
 Remote PPM Transmission

## 2024-01-13 ENCOUNTER — Encounter: Payer: Self-pay | Admitting: Psychology

## 2024-02-02 ENCOUNTER — Telehealth: Payer: Self-pay | Admitting: Pharmacy Technician

## 2024-02-02 ENCOUNTER — Telehealth: Payer: Self-pay | Admitting: Internal Medicine

## 2024-02-02 ENCOUNTER — Telehealth: Payer: Self-pay | Admitting: Cardiovascular Disease

## 2024-02-02 MED ORDER — SPIRONOLACTONE 25 MG PO TABS: 25.0000 mg | ORAL_TABLET | Freq: Every day | ORAL | 3 refills | Status: DC

## 2024-02-02 NOTE — Telephone Encounter (Signed)
*  STAT* If patient is at the pharmacy, call can be transferred to refill team.   1. Which medications need to be refilled? (please list name of each medication and dose if known)   spironolactone  (ALDACTONE ) 25 MG tablet     2. Would you like to learn more about the convenience, safety, & potential cost savings by using the [facility]? No    3. Are you open to using the [facility] (Type [facility]. No    4. Which pharmacy/location (including street and city if local pharmacy) is medication to be sent to?[id number] - [place], [place] - [address]    5. Do they need a 30 day or 90 day supply? 90 day    Pt dosage was increased from half tablet daily to one tablet daily. She is out of medicine before her refill date . Please advise.   Pt is out of medication.

## 2024-02-02 NOTE — Telephone Encounter (Signed)
 Patient Advocate Encounter   The patient was approved for a Healthwell grant that will help cover the cost of jardiance  Total amount awarded, 7500.  Effective: 02/11/2024 - 02/09/2025   APW:[id number] ERW:[id number] Hmnle:[id number] PI:[id number] Healthwell ID: [id number]   Pharmacy provided with approval and processing information. Patient informed via telephone

## 2024-02-02 NOTE — Telephone Encounter (Signed)
 Pt husband requesting callback from nurse Shamea. Please advise.

## 2024-02-02 NOTE — Telephone Encounter (Signed)
 Spoke with pt's husband. He wants to know what do he needs to do to get the grant approved again.  Will get message to pharmacy staff for assistance.  They would prefer telephone calls, they do not have a computer to log onto MyChart.

## 2024-02-02 NOTE — Telephone Encounter (Signed)
 Refill sent to preferred pharmacy.   Dose was changed 10/29/23- was sent to [facility] pharmacy, not preferred pharmacy.

## 2024-02-02 NOTE — Telephone Encounter (Signed)
 Pt of Dr. Francyne. She is stating that she is out of Meds because she is taking more than what's prescribed. RX was changed. I don't see this. Please advise.

## 2024-02-09 DIAGNOSIS — R42 Dizziness and giddiness: Secondary | ICD-10-CM | POA: Diagnosis not present

## 2024-02-09 DIAGNOSIS — Z0001 Encounter for general adult medical examination with abnormal findings: Secondary | ICD-10-CM | POA: Diagnosis not present

## 2024-02-09 DIAGNOSIS — D559 Anemia due to enzyme disorder, unspecified: Secondary | ICD-10-CM | POA: Diagnosis not present

## 2024-02-09 DIAGNOSIS — I10 Essential (primary) hypertension: Secondary | ICD-10-CM | POA: Diagnosis not present

## 2024-02-09 DIAGNOSIS — E7800 Pure hypercholesterolemia, unspecified: Secondary | ICD-10-CM | POA: Diagnosis not present

## 2024-02-09 DIAGNOSIS — Z131 Encounter for screening for diabetes mellitus: Secondary | ICD-10-CM | POA: Diagnosis not present

## 2024-02-09 DIAGNOSIS — R5383 Other fatigue: Secondary | ICD-10-CM | POA: Diagnosis not present

## 2024-02-09 DIAGNOSIS — E039 Hypothyroidism, unspecified: Secondary | ICD-10-CM | POA: Diagnosis not present

## 2024-02-09 DIAGNOSIS — E559 Vitamin D deficiency, unspecified: Secondary | ICD-10-CM | POA: Diagnosis not present

## 2024-02-16 ENCOUNTER — Other Ambulatory Visit (HOSPITAL_COMMUNITY): Payer: Self-pay | Admitting: Internal Medicine

## 2024-02-16 DIAGNOSIS — R0989 Other specified symptoms and signs involving the circulatory and respiratory systems: Secondary | ICD-10-CM | POA: Diagnosis not present

## 2024-02-16 DIAGNOSIS — Z0001 Encounter for general adult medical examination with abnormal findings: Secondary | ICD-10-CM | POA: Diagnosis not present

## 2024-02-16 DIAGNOSIS — I421 Obstructive hypertrophic cardiomyopathy: Secondary | ICD-10-CM | POA: Diagnosis not present

## 2024-02-16 DIAGNOSIS — I10 Essential (primary) hypertension: Secondary | ICD-10-CM | POA: Diagnosis not present

## 2024-02-16 DIAGNOSIS — N393 Stress incontinence (female) (male): Secondary | ICD-10-CM | POA: Diagnosis not present

## 2024-02-16 DIAGNOSIS — E039 Hypothyroidism, unspecified: Secondary | ICD-10-CM | POA: Diagnosis not present

## 2024-02-16 DIAGNOSIS — Z13820 Encounter for screening for osteoporosis: Secondary | ICD-10-CM

## 2024-02-16 DIAGNOSIS — E559 Vitamin D deficiency, unspecified: Secondary | ICD-10-CM | POA: Diagnosis not present

## 2024-02-16 DIAGNOSIS — G2581 Restless legs syndrome: Secondary | ICD-10-CM | POA: Diagnosis not present

## 2024-02-16 DIAGNOSIS — R0600 Dyspnea, unspecified: Secondary | ICD-10-CM | POA: Diagnosis not present

## 2024-02-23 ENCOUNTER — Ambulatory Visit: Attending: Internal Medicine | Admitting: Internal Medicine

## 2024-02-23 VITALS — BP 120/68 | HR 60 | Ht 62.0 in | Wt 131.0 lb

## 2024-02-23 DIAGNOSIS — I480 Paroxysmal atrial fibrillation: Secondary | ICD-10-CM

## 2024-02-23 DIAGNOSIS — I422 Other hypertrophic cardiomyopathy: Secondary | ICD-10-CM

## 2024-02-23 DIAGNOSIS — Z79899 Other long term (current) drug therapy: Secondary | ICD-10-CM | POA: Diagnosis not present

## 2024-02-23 DIAGNOSIS — I421 Obstructive hypertrophic cardiomyopathy: Secondary | ICD-10-CM | POA: Diagnosis not present

## 2024-02-23 DIAGNOSIS — I10 Essential (primary) hypertension: Secondary | ICD-10-CM | POA: Diagnosis not present

## 2024-02-23 DIAGNOSIS — I495 Sick sinus syndrome: Secondary | ICD-10-CM

## 2024-02-23 LAB — BASIC METABOLIC PANEL WITH GFR
BUN/Creatinine Ratio: 14 (ref 12–28)
BUN: 12 mg/dL (ref 8–27)
CO2: 27 mmol/L (ref 20–29)
Calcium: 10.7 mg/dL — ABNORMAL HIGH (ref 8.7–10.3)
Chloride: 101 mmol/L (ref 96–106)
Creatinine, Ser: 0.85 mg/dL (ref 0.57–1.00)
Glucose: 84 mg/dL (ref 70–99)
Potassium: 4.3 mmol/L (ref 3.5–5.2)
Sodium: 142 mmol/L (ref 134–144)
eGFR: 73 mL/min/1.73 (ref >59)

## 2024-02-23 MED ORDER — DILTIAZEM HCL 30 MG PO TABS: 30.0000 mg | ORAL_TABLET | Freq: Three times a day (TID) | ORAL | 3 refills | Status: DC | PRN

## 2024-02-23 NOTE — Progress Notes (Signed)
 Cardiology Office Note:  .    Date:  02/23/2024  ID:  Alexandria French, DOB 1950-05-27, MRN [id number] PCP: Trudy Vaughn FALCON, MD  [facility] Providers Cardiologist:  Jerel Balding, MD     CC: HCM   History of Present Illness: .    Alexandria French is a 73 y.o. female with hypertrophic cardiomyopathy who presents for follow-up regarding her condition. She is seen in conjunction with Dr. Balding for assessment related to her hypertrophic cardiomyopathy.  She has a history of hypertrophic cardiomyopathy with a maximal septal thickness of 16 millimeters. A stress echocardiogram in 2024 showed no evidence of nonsustained ventricular tachycardia or left ventricular outflow tract obstruction. She is on anticoagulation therapy. She is status post implantable cardioverter-defibrillator (ICD) and is AV paced.  Recently, she experienced two episodes of atrial fibrillation, describing them as feeling 'not normal at all.' During these episodes, her home blood pressure monitor indicated irregular readings. When not in atrial fibrillation, she feels normal but lacks the energy she once had.  She has a history of coronary artery calcifications and frequent premature ventricular contractions (PVCs). She has been on SGLT2 therapy, Lasix , and spironolactone  since her last evaluation. Despite this, she reports persistent swelling and notes that her blood pressure readings have been lower than usual, particularly in the mornings, with systolic readings in the low 100s.  Her son is 30 years old, and there has been discussion about genetic testing or echocardiogram screening for him due to her condition.  Discussed the use of AI scribe software for clinical note transcription with the patient, who gave verbal consent to proceed.   Relevant histories: .  Social  - One son, 30, who has yet to be tested ROS: As per HPI.   Studies Reviewed: .     Cardiac Studies & Procedures    ______________________________________________________________________________________________ CARDIAC CATHETERIZATION  CARDIAC CATHETERIZATION 12/15/2020  Conclusion   LV end diastolic pressure is severely elevated.   Hemodynamic findings consistent with mild pulmonary hypertension.  SUMMARY Angiographically Normal Coronary arteries with a left dominant system. Significant LVOT gradient with apical LV pressures of 184/12 mmHg with an EDP of 25, mid LV cavity 157/9 mmHg with EDP of 25 and outflow tract pressure of 111/9 mmHg with a stable EDP of 24 mmHg. Mild Secondary Pulmonary Hypertension with mean PAP of 29 mmHg and PCWP of 29 mmHg. Moderately reduced cardiac function with a cardiac output-index of 3.26 and 2.02 by Fick and 3.55-2.2 by thermodilution.   RECOMMENDATIONS Gentle titration of medications to allow for diuresis without leading to systemic hypotension. Defer decision making to rounding cardiology service.    Alm Clay, MD  Findings Coronary Findings Diagnostic  Dominance: Left  Left Main Vessel was injected. Vessel is large. Vessel is angiographically normal.  Left Anterior Descending Vessel was injected. Vessel is normal in caliber. Large wraparound draping vessel Vessel is angiographically normal.  Third Diagonal Branch Vessel is small in size.  Ramus Intermedius Vessel was injected. Vessel is moderate in size. Vessel is angiographically normal.  Left Circumflex Vessel was injected. Vessel is normal in caliber and large. Vessel is angiographically normal.  First Obtuse Marginal Branch Vessel is small in size.  Left Posterior Descending Artery Vessel is moderate in size.  First Left Posterolateral Branch Vessel is moderate in size.  Second Left Posterolateral Branch Vessel is large in size.  Left Posterior Atrioventricular Artery Vessel is large in size.  Right Coronary Artery Vessel was injected. Vessel is small. Vessel is  angiographically  normal.  Acute Marginal Branch Vessel was injected. Vessel is small in size. Vessel is angiographically normal.  Right Ventricular Branch Vessel was injected. Vessel is small in size. Vessel is angiographically normal.  Intervention  No interventions have been documented.   STRESS TESTS  ECHOCARDIOGRAM STRESS TEST 09/23/2022  Narrative EXERCISE STRESS ECHO REPORT   --------------------------------------------------------------------------------  Patient Name:   Alexandria French Date of Exam: 09/23/2022 Medical Rec #:  [id number]     Height:       62.0 in Accession #:    [id number]    Weight:       142.4 lb Date of Birth:  1950/08/14    BSA:          1.655 m Patient Age:    77 years      BP:           150/89 mmHg Patient Gender: F             HR:           57 bpm. Exam Location:  [address]  Procedure: Stress Echo, Limited Color Doppler and Cardiac Doppler  Indications:    HOCM I42.1  History:        Patient has prior history of Echocardiogram examinations, most recent 07/16/2022.  Sonographer:    Elsie Bohr RDCS Referring Phys: [id number] [facility] A SANTO   Sonographer Comments: [facility] DPR SIGNED ON 10/23/20. OK TO SPEAK TO HUSBAND KARL Mountjoy AND SON DANNY Clare IMPRESSIONS   1. Fair exercise capacity, achieved 6.6 METS. 2. Did not reach target HR. Peak HR 111 bpm (73% max age predicted HR). 3. Normal BP response to exercise. 4. Stress EKG not interpretable due to V-paced rhythm. 5. This is an inconclusive stress echocardiogram for ischemia. Non-diagnostic study for ischemia due to inadequate heart rate response. 6. This is an indeterminate risk study. 7. No resting LVOT gradient. Peak gradient during stress measured (measured with CW Doppler, unclear if location of obstruction was LVOT or mid cavitary)  FINDINGS  Exam Protocol: The patient exercised on a treadmill according to a Bruce protocol.   Patient Performance: The patient  exercised for 5 minutes and 45 seconds, achieving 6.6 METS. The maximum stage achieved was II of the Bruce protocol. The baseline heart rate was 65 bpm. The heart rate at peak stress was 110 bpm. The target heart rate was calculated to be 128 bpm. The percentage of maximum predicted heart rate achieved was 73.2 %. The baseline blood pressure was 150/89 mmHg. The blood pressure at peak stress was 176/61 mmHg. The patient developed fatigue and shortness of breath during the stress exam.  EKG: Resting EKG showed Paced. The patient developed AV paced and baseline abnormalities during exercise. The stress EKG was not interpretable due to baseline abnormalities.   2D Echo Findings: The baseline ejection fraction was 60%. Baseline regional wall motion abnormalities were not present. This is an inconclusive stress echocardiogram for ischemia. This is a non-diagnostic study for ischemia due to inadequate heart rate response.  Additional Findings: Fair exercise capacity, achieved 6.6 METS. Did not reach target HR. Peak HR 111 bpm (73% max age predicted HR). Normal BP response to exercise. Stress EKG not interpretable due to V-paced rhythm.   Lonni Nanas MD Electronically signed on 09/23/2022 at 9:35:09 PM     Final   ECHOCARDIOGRAM  ECHOCARDIOGRAM COMPLETE 07/16/2022  Narrative ECHOCARDIOGRAM REPORT    Patient Name:   ANDERSEN MCKIVER Date of Exam: 07/16/2022 Medical Rec #:  [id number]     Height:       62.0 in Accession #:    [id number]    Weight:       140.0 lb Date of Birth:  [birth year]    BSA:          1.643 m Patient Age:    36 years      BP:           126/74 mmHg Patient Gender: F             HR:           68 bpm. Exam Location:  [address]  Procedure: 2D Echo, Cardiac Doppler and Color Doppler  Indications:    I42.1 HOCM  History:        Patient has prior history of Echocardiogram examinations, most recent 12/15/2020. CAD, Pacemaker, Arrythmias:LBBB and  Atrial Fibrillation; Risk Factors:Hypertension. Sick Sinus Syndrome.  Sonographer:    Carl Rodgers-Jones RDCS Referring Phys: [id number] MIHAI CROITORU  IMPRESSIONS   1. Left ventricular ejection fraction, by estimation, is 60 to 65%. The left ventricle has normal function. The left ventricle has no regional wall motion abnormalities. moderate-severe asymmetric left ventricular hypertrophy of the septal segment (15-16 mm). No resting LVOT obstruction. With Valsalva, max instantaneous gradient 10 mmHg (1.42 m/s). No systolic anterior motion of the mitral valve at rest. Left ventricular diastolic parameters are consistent with Grade I diastolic dysfunction (impaired relaxation). 2. Right ventricular systolic function is normal. The right ventricular size is normal. There is normal pulmonary artery systolic pressure. The estimated right ventricular systolic pressure is 23.2 mmHg. 3. The mitral valve is normal in structure. Trivial mitral valve regurgitation. No evidence of mitral stenosis. 4. The aortic valve is tricuspid. There is mild calcification of the aortic valve. There is mild thickening of the aortic valve. Aortic valve regurgitation is mild. No aortic stenosis is present. 5. The inferior vena cava is normal in size with greater than 50% respiratory variability, suggesting right atrial pressure of 3 mmHg.  FINDINGS Left Ventricle: Left ventricular ejection fraction, by estimation, is 60 to 65%. The left ventricle has normal function. The left ventricle has no regional wall motion abnormalities. The left ventricular internal cavity size was normal in size. Moderate-severe asymmetric left ventricular hypertrophy of the septal segment. Left ventricular diastolic parameters are consistent with Grade I diastolic dysfunction (impaired relaxation).  Right Ventricle: The right ventricular size is normal. No increase in right ventricular wall thickness. Right ventricular systolic function is normal.  There is normal pulmonary artery systolic pressure. The tricuspid regurgitant velocity is 2.25 m/s, and with an assumed right atrial pressure of 3 mmHg, the estimated right ventricular systolic pressure is 23.2 mmHg.  Left Atrium: Left atrial size was normal in size.  Right Atrium: Right atrial size was normal in size.  Pericardium: There is no evidence of pericardial effusion.  Mitral Valve: The mitral valve is normal in structure. Trivial mitral valve regurgitation. No evidence of mitral valve stenosis.  Tricuspid Valve: The tricuspid valve is normal in structure. Tricuspid valve regurgitation is mild . No evidence of tricuspid stenosis.  Aortic Valve: The aortic valve is tricuspid. There is mild calcification of the aortic valve. There is mild thickening of the aortic valve. Aortic valve regurgitation is mild. Aortic regurgitation PHT measures 737 msec. No aortic stenosis is present.  Pulmonic Valve: The pulmonic valve was normal in structure. Pulmonic valve regurgitation is mild. No evidence of pulmonic stenosis.  Aorta: The aortic root is  normal in size and structure.  Venous: The inferior vena cava is normal in size with greater than 50% respiratory variability, suggesting right atrial pressure of 3 mmHg.  IAS/Shunts: No atrial level shunt detected by color flow Doppler.  Additional Comments: A device lead is visualized in the right atrium and right ventricle.   LEFT VENTRICLE PLAX 2D LVIDd:         3.50 cm   Diastology LVIDs:         2.20 cm   LV e' medial:    5.22 cm/s LV PW:         1.20 cm   LV E/e' medial:  13.2 LV IVS:        1.60 cm   LV e' lateral:   7.40 cm/s LVOT diam:     1.90 cm   LV E/e' lateral: 9.3 LV SV:         83 LV SV Index:   51 LVOT Area:     2.84 cm   RIGHT VENTRICLE             IVC RV Basal diam:  3.30 cm     IVC diam: 1.30 cm RV S prime:     12.05 cm/s TAPSE (M-mode): 1.8 cm  LEFT ATRIUM             Index        RIGHT ATRIUM            Index LA diam:        4.20 cm 2.56 cm/m   RA Area:     10.00 cm LA Vol (A2C):   59.0 ml 35.91 ml/m  RA Volume:   21.80 ml  13.27 ml/m LA Vol (A4C):   37.9 ml 23.07 ml/m LA Biplane Vol: 48.0 ml 29.22 ml/m AORTIC VALVE LVOT Vmax:   131.00 cm/s LVOT Vmean:  86.200 cm/s LVOT VTI:    0.294 m AI PHT:      737 msec  AORTA Ao Root diam: 2.90 cm Ao Asc diam:  3.10 cm  MITRAL VALVE               TRICUSPID VALVE MV Area (PHT): 2.60 cm    TR Peak grad:   20.2 mmHg MV Decel Time: 292 msec    TR Vmax:        225.00 cm/s MR Peak grad: 95.6 mmHg MR Vmax:      489.00 cm/s  SHUNTS MV E velocity: 68.65 cm/s  Systemic VTI:  0.29 m MV A velocity: 98.45 cm/s  Systemic Diam: 1.90 cm MV E/A ratio:  0.70  Soyla Merck MD Electronically signed by Soyla Merck MD Signature Date/Time: 07/16/2022/3:00:14 PM    Final    MONITORS  LONG TERM MONITOR (3-14 DAYS) 11/25/2019  Narrative  The dominant rhythm is mild sinus bradycardia and normal sinus rhythm with blunted circadian rhythm variation. The average heart rate is 55 bpm. The maximum heart rate during sinus rhythm is only 93 bpm representing 60% of maximum predicted heart rate for age  There are frequent PVCs, representing 6% of all QRS complexes. Most of these occur isolated at, with rare couplets or triplets. There is no evidence of ventricular tachycardia.  Rare brief episodes of nonsustained atrial tachycardia are seen, with a maximum of 17 consecutive beats. The morphology suggests ectopic atrial tachycardia.  Abnormal rhythm monitor with findings suggestive of sinus node dysfunction (persistent bradycardia and blunted chronotropic response).  Also seen are frequent isolated PVCs  and rare brief nonsustained atrial tachycardia. Findings of sinus bradycardia and blunted response to exercise are present even prior to initiation of beta-blocker therapy.   CT SCANS  CT CORONARY MORPH W/CTA COR W/SCORE 12/01/2018  Addendum 12/03/2018   9:09 PM ADDENDUM REPORT: 12/03/2018 21:07  HISTORY: 73 yo female with chest pain  EXAM: Cardiac/Coronary CTA  TECHNIQUE: The patient was scanned on a [employer].  PROTOCOL: A 120 kV prospective scan was triggered in the descending thoracic aorta at 111 HU's. Axial non-contrast 3 mm slices were carried out through the heart. The data set was analyzed on a dedicated work station and scored using the Agatson method. Gantry rotation speed was 250 msecs and collimation was .6 mm. Beta blockade and 0.8 mg of sl NTG was given. The 3D data set was reconstructed in 5% intervals of the 67-82 % of the R-R cycle. Diastolic phases were analyzed on a dedicated work station using MPR, MIP and VRT modes. The patient received 100mL OMNIPAQUE  IOHEXOL  350 MG/ML SOLN of contrast.  FINDINGS: Coronary calcium  score: The patient's coronary artery calcium  score is 149, which places the patient in the 85th percentile.  Coronary arteries: Normal coronary origins.  Left dominance  Right Coronary Artery: Small, non-dominant vessel which gives off a larger AV nodal branch. No obstructive disease.  Left Main Coronary Artery: Prominent left coronary cusp, but not anuerymsal. No stenosis. Gives off larger LAD and LCx arteries.  Left Anterior Descending Coronary Artery: Calcified proximal vessel with mild 25-49% (CADRADS2) mixed stenosis. Gives off several small diagonal branches.  Ramus Intermedius Artery: Small branch without significant stenosis.  Left Circumflex Artery: Dominant vessel that gives off a small PDA artery. Larger caliber with minimal mixed 1-24% mid-vessel stenosis (CADRADS1).  Aorta: Normal size, 28 mm at the mid ascending aorta (level of the PA bifurcation) measured double oblique. Atherosclerosis. No dissection.  Aortic Valve: Trileaflet.  Calcification of the annulus.  Other findings:  Thickening of the aortic and mitral valve leaflets.  Normal biatrial  size  Normal pulmonary vein drainage into the left atrium.  Normal left atrial appendage without a thrombus.  Normal size of the pulmonary artery.  IMPRESSION: 1. Mild, non-obstructive CAD primarily of the LAD, CADRADS = 2.  2. Coronary calcium  score of 149. This was 85th percentile for age and sex matched control.  3. Normal coronary origin with left dominance.  4. Thickening of the aortic and mitral valve leaflets   Electronically Signed By: Vinie JAYSON Maxcy M.D. On: 12/03/2018 21:07  Narrative EXAM: OVER-READ INTERPRETATION  CT CHEST  The following report is an over-read performed by radiologist Dr. Toribio Aye of [facility] on 12/01/2018. This over-read does not include interpretation of cardiac or coronary anatomy or pathology. The coronary calcium  score/coronary CTA interpretation by the cardiologist is attached.  COMPARISON:  None.  FINDINGS: Aortic atherosclerosis. Within the visualized portions of the thorax there are no suspicious appearing pulmonary nodules or masses, there is no acute consolidative airspace disease, no pleural effusions, no pneumothorax and no lymphadenopathy. Visualized portions of the upper abdomen are unremarkable. There are no aggressive appearing lytic or blastic lesions noted in the visualized portions of the skeleton.  IMPRESSION: 1.  Aortic Atherosclerosis (ICD10-I70.0).  Electronically Signed: By: Toribio Aye M.D. On: 12/01/2018 12:34     ______________________________________________________________________________________________        Physical Exam:    VS:  BP 120/68   Pulse 60   Ht 5' 2 (1.575 m)   Wt 131  lb (59.4 kg)   SpO2 97%   BMI 23.96 kg/m    Wt Readings from Last 3 Encounters:  02/23/24 131 lb (59.4 kg)  09/16/23 132 lb (59.9 kg)  06/19/23 131 lb (59.4 kg)    Gen: no distress   Neck: No JVD Cardiac: No Rubs or Gallops, systolic murmur, RRR +2 radial pulses Respiratory:  Clear to auscultation bilaterally, normal effort, normal  respiratory rate GI: Soft, nontender, non-distended  MS: Non pitting edema;  moves all extremities Integument: Skin feels warm Neuro:  At time of evaluation, alert and oriented to person/place/time/situation  Psych: Normal affect, patient feels ok     ASSESSMENT AND PLAN: .    Hypertrophic Cardiomyopathy  - Non obstructive with stress echo in 2024 - suspicion of Fabry's/Danon/Noonan's or other mimics of HCM: low - Gene variant: Deferred - NYHA II  - Non HCM Contributors to disease/status  Coronary artery calcification -Present but no recent angina reported. Cholesterol levels are well-controlled.  Status post implantable cardioverter-defibrillator and AV pacemaker -Status post ICD and AV pacemaker placement. AV sequential pacing reduces risk of sudden cardiac death. Pacemaker monitoring is in place for arrhythmia detection.  Frequent premature ventricular contractions - no NSVT; given age,   Hypotension Episodes of hypotension with systolic blood pressure in the low 100s. Possible contribution from bisoprolol . Consideration of reducing bisoprolol  if atrial fibrillation is controlled. - Monitor blood pressure closely. - Will consider reducing bisoprolol  if atrial fibrillation is controlled.  I think this is the biggest cause of her symptoms but is needed for AF   Family history reviewed, Discussed family screening  - gave her information for her son on echo screening; deferred genetic testing after SDM  SCD  Assessment - SCD risk estimated to be 0.99% at 5 years SDM: we have discussed Class II indication for ICD; will f/u her PPM to see if she needs upgrade in the future (likely only for secondary prevention  Atrial fibrillation Assessment  - HCM-AF score NA (known AF) - Nonobstructive hypertrophic cardiomyopathy with paroxysmal atrial fibrillation and heart failure Nonobstructive hypertrophic cardiomyopathy with  maximal septal thickness of 16 mm. Paroxysmal atrial fibrillation with historically low burden but increased stroke risk, necessitating anticoagulation. Heart failure with persistent swelling despite diuretic therapy. Blood pressure lower than usual, possibly due to bisoprolol . Consideration of ablation for atrial fibrillation due to recent episodes and potential worsening of HCM. Ablation technology is improving, but patients with HCM may have worse outcomes, suggesting earlier or more aggressive intervention. - Ordered lab work to assess fluid status and potassium levels (BNP and BMP) - Will consider starting furosemide  every 2-3 days if lab work is normal. - Will discuss potential ablation with Dr. Francyne for atrial fibrillation management. - Continue anticoagulation therapy (CHADSVASC NA) - doing much better on SGLT2i per her husband  One year unless clinical trial availability  Longitudinal care: The evaluation and management services provided today reflect the complexity inherent in caring for this patient, including the ongoing longitudinal relationship and management of multiple chronic conditions and/or the need for care coordination. The visit required a comprehensive assessment and management plan tailored to the patient's unique needs Time was spent addressing not only the acute concerns but also the broader context of the patient's health, including preventive care, chronic disease management, and care coordination as appropriate.  Complex longitudinal is necessary for conditions including: Non-obstructive HCM with improved sx with treatment via SGLT2i; family screening plan initiated    Stanly Leavens, MD FASE [facility] Cardiologist  [facility]  [address] [place], [place] [address] [phone]  10:35 AM

## 2024-02-23 NOTE — Patient Instructions (Signed)
 Medication Instructions:  Your physician recommends that you continue on your current medications as directed. Please refer to the Current Medication list given to you today.  *If you need a refill on your cardiac medications before your next appointment, please call your pharmacy*  Lab Work: Today:  BNP & BMP   If you have any lab test that is abnormal or we need to change your treatment, we will call you to review the results.  Testing/Procedures: None ordered  Follow-Up: At [facility], you and your health needs are our priority.  As part of our continuing mission to provide you with exceptional heart care, our providers are all part of one team.  This team includes your primary Cardiologist (physician) and Advanced Practice Providers or APPs (Physician Assistants and Nurse Practitioners) who all work together to provide you with the care you need, when you need it.  Your next appointment:   1 year(s)  Provider:   Dr. Santo    Thank you for choosing [facility]!!    [phone]

## 2024-02-23 NOTE — Progress Notes (Signed)
 Hello, Mahesh, thanks for reaching out. According to her pacemaker, her prevalence of atrial fibrillation recently has been extremely low, less than 1% over the last year. I do not know how excited our EP guys will be to do ablation with that low burden of A-fib. I think it is perfectly fine to just reduce her dose of bisoprolol  first, to see what happens.  If she then develops a lot of A-fib, I think she would be a good ablation candidate. Sarea Fyfe

## 2024-02-24 ENCOUNTER — Other Ambulatory Visit (HOSPITAL_COMMUNITY): Payer: Self-pay | Admitting: Internal Medicine

## 2024-02-24 DIAGNOSIS — Z1231 Encounter for screening mammogram for malignant neoplasm of breast: Secondary | ICD-10-CM

## 2024-02-26 ENCOUNTER — Ambulatory Visit: Payer: Self-pay | Admitting: Internal Medicine

## 2024-03-01 ENCOUNTER — Ambulatory Visit (HOSPITAL_COMMUNITY)
Admission: RE | Admit: 2024-03-01 | Discharge: 2024-03-01 | Disposition: A | Source: Ambulatory Visit | Attending: Internal Medicine | Admitting: Internal Medicine

## 2024-03-01 DIAGNOSIS — R0989 Other specified symptoms and signs involving the circulatory and respiratory systems: Secondary | ICD-10-CM | POA: Insufficient documentation

## 2024-03-01 DIAGNOSIS — I6521 Occlusion and stenosis of right carotid artery: Secondary | ICD-10-CM | POA: Diagnosis not present

## 2024-03-04 ENCOUNTER — Other Ambulatory Visit (HOSPITAL_COMMUNITY): Payer: Self-pay | Admitting: Internal Medicine

## 2024-03-04 ENCOUNTER — Ambulatory Visit (HOSPITAL_COMMUNITY)
Admission: RE | Admit: 2024-03-04 | Discharge: 2024-03-04 | Disposition: A | Source: Ambulatory Visit | Attending: Internal Medicine | Admitting: Internal Medicine

## 2024-03-04 ENCOUNTER — Inpatient Hospital Stay
Admission: RE | Admit: 2024-03-04 | Discharge: 2024-03-04 | Disposition: A | Payer: Self-pay | Source: Ambulatory Visit | Attending: Internal Medicine | Admitting: Internal Medicine

## 2024-03-04 ENCOUNTER — Encounter (HOSPITAL_COMMUNITY): Payer: Self-pay

## 2024-03-04 DIAGNOSIS — Z1231 Encounter for screening mammogram for malignant neoplasm of breast: Secondary | ICD-10-CM

## 2024-03-04 DIAGNOSIS — Z780 Asymptomatic menopausal state: Secondary | ICD-10-CM | POA: Diagnosis not present

## 2024-03-04 DIAGNOSIS — Z13820 Encounter for screening for osteoporosis: Secondary | ICD-10-CM | POA: Insufficient documentation

## 2024-03-04 DIAGNOSIS — M8589 Other specified disorders of bone density and structure, multiple sites: Secondary | ICD-10-CM | POA: Diagnosis not present

## 2024-03-07 ENCOUNTER — Other Ambulatory Visit: Payer: Self-pay | Admitting: Cardiovascular Disease

## 2024-03-07 DIAGNOSIS — Z7901 Long term (current) use of anticoagulants: Secondary | ICD-10-CM

## 2024-03-07 DIAGNOSIS — I480 Paroxysmal atrial fibrillation: Secondary | ICD-10-CM

## 2024-03-08 NOTE — Telephone Encounter (Signed)
 Eliquis  5mg  refill request received. Patient is 73 years old, weight-59.4kg, Crea-0.85 on 02/23/24, Diagnosis-Afib, and last seen by Dr. Santo on 02/23/24 and Dr. Francyne on 06/19/23 Dose is appropriate based on dosing criteria. Will send in refill to requested pharmacy.

## 2024-03-15 ENCOUNTER — Other Ambulatory Visit: Payer: Self-pay

## 2024-03-15 MED ORDER — BISOPROLOL FUMARATE 5 MG PO TABS: 5.0000 mg | ORAL_TABLET | Freq: Two times a day (BID) | ORAL | 3 refills | Status: AC

## 2024-03-17 MED ORDER — EMPAGLIFLOZIN 10 MG PO TABS: 10.0000 mg | ORAL_TABLET | Freq: Every day | ORAL | 3 refills | Status: DC

## 2024-04-01 ENCOUNTER — Encounter

## 2024-04-02 ENCOUNTER — Ambulatory Visit: Attending: Internal Medicine

## 2024-04-02 DIAGNOSIS — I480 Paroxysmal atrial fibrillation: Secondary | ICD-10-CM

## 2024-04-07 ENCOUNTER — Ambulatory Visit: Payer: Self-pay | Admitting: Cardiovascular Disease

## 2024-04-07 LAB — CUP PACEART REMOTE DEVICE CHECK
Battery Remaining Longevity: 84 mo
Battery Remaining Percentage: 82 %
Brady Statistic RA Percent Paced: 97 %
Brady Statistic RV Percent Paced: 95 %
Date Time Interrogation Session: 20260102021200
Implantable Lead Connection Status: 753985
Implantable Lead Connection Status: 753985
Implantable Lead Implant Date: 20211129
Implantable Lead Implant Date: 20211129
Implantable Lead Location: 753859
Implantable Lead Location: 753860
Implantable Lead Model: 7840
Implantable Lead Model: 7841
Implantable Lead Serial Number: 1036898
Implantable Lead Serial Number: 1100104
Implantable Pulse Generator Implant Date: 20211129
Lead Channel Impedance Value: 673 Ohm
Lead Channel Impedance Value: 871 Ohm
Lead Channel Pacing Threshold Amplitude: 0.6 V
Lead Channel Pacing Threshold Amplitude: 0.7 V
Lead Channel Pacing Threshold Pulse Width: 0.4 ms
Lead Channel Pacing Threshold Pulse Width: 0.4 ms
Lead Channel Setting Pacing Amplitude: 3.5 V
Lead Channel Setting Pacing Amplitude: 3.5 V
Lead Channel Setting Pacing Pulse Width: 0.4 ms
Lead Channel Setting Sensing Sensitivity: 2.5 mV
Pulse Gen Serial Number: 951099
Zone Setting Status: 755011

## 2024-04-08 NOTE — Progress Notes (Signed)
 Remote PPM Transmission

## 2024-04-20 ENCOUNTER — Encounter: Payer: Self-pay | Admitting: Family Medicine

## 2024-04-20 ENCOUNTER — Ambulatory Visit: Admitting: Family Medicine

## 2024-04-20 VITALS — Ht 62.0 in | Wt 134.5 lb

## 2024-04-20 DIAGNOSIS — G3184 Mild cognitive impairment, so stated: Secondary | ICD-10-CM | POA: Diagnosis not present

## 2024-04-20 NOTE — Patient Instructions (Signed)
 Below is our plan:  We will continue to monitor.   Please make sure you are staying well hydrated. I recommend 50-60 ounces daily. Well balanced diet and regular exercise encouraged. Consistent sleep schedule with 6-8 hours recommended.   Please continue follow up with care team as directed.   Follow up with me in 6 months   You may receive a survey regarding today's visit. I encourage you to leave honest feed back as I do use this information to improve patient care. Thank you for seeing me today!   Management of Memory Problems   There are some general things you can do to help manage your memory problems.  Your memory may not in fact recover, but by using techniques and strategies you will be able to manage your memory difficulties better.   1)  Establish a routine. Try to establish and then stick to a regular routine.  By doing this, you will get used to what to expect and you will reduce the need to rely on your memory.  Also, try to do things at the same time of day, such as taking your medication or checking your calendar first thing in the morning. Think about think that you can do as a part of a regular routine and make a list.  Then enter them into a daily planner to remind you.  This will help you establish a routine.   2)  Organize your environment. Organize your environment so that it is uncluttered.  Decrease visual stimulation.  Place everyday items such as keys or cell phone in the same place every day (ie.  Basket next to front door) Use post it notes with a brief message to yourself (ie. Turn off light, lock the door) Use labels to indicate where things go (ie. Which cupboards are for food, dishes, etc.) Keep a notepad and pen by the telephone to take messages   3)  Memory Aids A diary or journal/notebook/daily planner Making a list (shopping list, chore list, to do list that needs to be done) Using an alarm as a reminder (kitchen timer or cell phone alarm) Using cell  phone to store information (Notes, Calendar, Reminders) Calendar/White board placed in a prominent position Post-it notes   In order for memory aids to be useful, you need to have good habits.  It's no good remembering to make a note in your journal if you don't remember to look in it.  Try setting aside a certain time of day to look in journal.   4)  Improving mood and managing fatigue. There may be other factors that contribute to memory difficulties.  Factors, such as anxiety, depression and tiredness can affect memory. Regular gentle exercise can help improve your mood and give you more energy. Exercise: there are short videos created by the [employer] on Health specially for older adults: https://bit.ly/2I30q97.  Mediterranean diet: which emphasizes fruits, vegetables, whole grains, legumes, fish, and other seafood; unsaturated fats such as olive oils; and low amounts of red meat, eggs, and sweets. A variation of this, called MIND [facility] Intervention for Neurodegenerative Delay) incorporates the DASH (Dietary Approaches to Stop Hypertension) diet, which has been shown to lower high blood pressure, a risk factor for Alzheimers disease. More information at: exitmarketing.de.  Aerobic exercise that improve heart health is also good for the mind.  [employer] on Aging have short videos for exercises that you can do at home: Blindworkshop.com.pt Simple relaxation techniques may help relieve symptoms of anxiety  Try to get back to completing activities or hobbies you enjoyed doing in the past. Learn to pace yourself through activities to decrease fatigue. Find out about some local support groups where you can share experiences with others. Try and achieve 7-8 hours of sleep at night.   Tasks to improve attention/working memory 1. Good sleep hygiene (7-8 hrs of sleep) 2. Learning a new skill  (Painting, Carpentry, Pottery, new language, Knitting). 3.Cognitive exercises (keep a daily journal, Puzzles) 4. Physical exercise and training  (30 min/day X 4 days week) 5. Being on Antidepressant if needed 6.Yoga, Meditation, Tai Chi 7. Decrease alcohol  intake 8.Have a clear schedule and structure in daily routine   MIND Diet: The Mediterranean-DASH Diet Intervention for Neurodegenerative Delay, or MIND diet, targets the health of the aging brain. Research participants with the highest MIND diet scores had a significantly slower rate of cognitive decline compared with those with the lowest scores. The effects of the MIND diet on cognition showed greater effects than either the Mediterranean or the DASH diet alone.   The healthy items the MIND diet guidelines suggest include:   3+ servings a day of whole grains 1+ servings a day of vegetables (other than green leafy) 6+ servings a week of green leafy vegetables 5+ servings a week of nuts 4+ meals a week of beans 2+ servings a week of berries 2+ meals a week of poultry 1+ meals a week of fish Mainly olive oil if added fat is used   The unhealthy items, which are higher in saturated and trans fat, include: Less than 5 servings a week of pastries and sweets Less than 4 servings a week of red meat (including beef, pork, lamb, and products made from these meats) Less than one serving a week of cheese and fried foods Less than 1 tablespoon a day of butter/stick margarine

## 2024-04-20 NOTE — Progress Notes (Signed)
 "   Chief Complaint  Patient presents with   RM1/MEMORY    Pt is here with her Husband. Pt states she feels like her memory has been about the same as her last appointment.     HISTORY OF PRESENT ILLNESS:  04/20/24 ALL:  Alexandria French returns for follow up for MCI. She was last seen 08/2023. MMSE 27/30. She reports doing well. No significant changes. Husband agrees. She continues to drive without difficulty. She manages home, finances and medications without difficulty. She is sleeping well. She tries to walk regualrly. She loves to read and doing activity books.   09/16/2023 ALL:  Alexandria French is a 74 y.o. female here today for follow up for MCI. She was seen in consult with Dr Chalice 03/2023. MMSE 28/30. She was referred to neuropsychology. pTau was elevated but ratio normal. APOE positive for two copied of E4. MRI normal. She was advised to discontinue use of Benadryl and start melatonin.   Since, she reports doing fairly well. She continues to note difficulty with short term memory loss. She has difficulty with recall. She feels that symptoms are mostly stable from previous visit. She did not see neuropsychology. She did stop use of Benadryl. Melatonin helps a little bit. She denies concerns of sleep apnea. She is independent with ADLs. She is able to drive short, local distances without difficulty. She manages medications independently. She helps manage home and finances. She lives with her husband.    HISTORY (copied from Dr Dohmeier's previous note)  HPI:  Alexandria French is a 74 y.o. female and seen here upon referral from Dr. Trudy for a Consultation/ Evaluation of memory loss.   This patient reports gradual onset of STM loss and word finding problems  over a period of 12-24 months.    Family  history, mother died of cancer at age 82,  father died at 60 and was in the end feeble. Maternal grandfather died at 47? With memory problems.    Maternal aunts  with cancer but no dementia  history   Social history : married with adult child, Salomon , now 10 .    HS graduate - no college experience. I worked at [employer] in [place] and then switched to [employer].  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   REVIEW OF SYSTEMS: Out of a complete 14 system review of symptoms, the patient complains only of the following symptoms, memory loss, left sided hearing loss and all other reviewed systems are negative.   ALLERGIES: Allergies  Allergen Reactions   Cefzil [Cefprozil] Other (See Comments)    angioedema     HOME MEDICATIONS: Outpatient Medications Prior to Visit  Medication Sig Dispense Refill   apixaban  (ELIQUIS ) 5 MG TABS tablet TAKE 1 TABLET BY MOUTH TWICE A DAY 180 tablet 3   bisoprolol  (ZEBETA ) 5 MG tablet Take 1 tablet (5 mg total) by mouth 2 (two) times daily. 180 tablet 3   Calcium  Carbonate-Vitamin D  600-400 MG-UNIT tablet Take 1 tablet by mouth 2 (two) times daily.      cetirizine (ZYRTEC) 10 MG tablet Take 10 mg by mouth every evening.      Coenzyme Q10 200 MG capsule Take 200 mg by mouth daily.     empagliflozin  (JARDIANCE ) 10 MG TABS tablet Take 1 tablet (10 mg total) by mouth daily before breakfast. 28 tablet 0   ferrous sulfate  325 (65 FE) MG EC tablet Take 325 mg by mouth at bedtime.  melatonin 5 MG TABS Take 2.5 mg by mouth at bedtime.     simvastatin  (ZOCOR ) 20 MG tablet Take 20 mg by mouth daily.     vitamin C (ASCORBIC ACID ) 500 MG tablet Take 500 mg by mouth daily.     acetaminophen  (TYLENOL ) 500 MG tablet Take 2 tablets (1,000 mg total) by mouth every 6 (six) hours as needed for fever or mild pain. (Patient not taking: Reported on 04/20/2024) 30 tablet 0   augmented betamethasone dipropionate (DIPROLENE-AF) 0.05 % cream Apply topically as needed. (Patient not taking: Reported on 04/20/2024)     diltiazem  (CARDIZEM ) 30 MG tablet Take 1 tablet (30 mg total) by mouth 3 (three) times daily as needed (palpitations). (Patient not taking: Reported  on 04/20/2024) 30 tablet 3   diphenhydrAMINE (BENADRYL) 25 MG tablet Take 12.5 mg by mouth at bedtime. (Patient not taking: Reported on 04/20/2024)     empagliflozin  (JARDIANCE ) 10 MG TABS tablet Take 1 tablet (10 mg total) by mouth daily before breakfast. (Patient not taking: Reported on 04/20/2024) 90 tablet 3   furosemide  (LASIX ) 20 MG tablet Take 1 tablet (20 mg total) by mouth daily as needed (swelling). (Patient not taking: Reported on 04/20/2024) 30 tablet 11   spironolactone  (ALDACTONE ) 25 MG tablet Take 1 tablet (25 mg total) by mouth daily. (Patient not taking: Reported on 04/20/2024) 90 tablet 3   No facility-administered medications prior to visit.     PAST MEDICAL HISTORY: Past Medical History:  Diagnosis Date   A-fib [facility])    CAD (coronary artery disease)    Colon adenocarcinoma (HCC)    resected no other treatment   Complication of anesthesia    sometimes BP flucuates   Diverticulosis    Esophageal reflux    no meds   External hemorrhoids    Gastritis    Hypertension    IDA (iron  deficiency anemia)    Internal hemorrhoids    Other and unspecified hyperlipidemia    Presence of permanent cardiac pacemaker 2021   bost sci   SSS (sick sinus syndrome) (HCC)      PAST SURGICAL HISTORY: Past Surgical History:  Procedure Laterality Date   ABDOMINAL HYSTERECTOMY  08/2008   BIOPSY  12/20/2020   Procedure: BIOPSY;  Surgeon: Avram Lupita BRAVO, MD;  Location: [facility] ENDOSCOPY;  Service: Endoscopy;;   BIOPSY  01/03/2022   Procedure: BIOPSY;  Surgeon: Albertus Gordy HERO, MD;  Location: [facility]COPY;  Service: Gastroenterology;;   BLADDER REPAIR     COLONOSCOPY WITH PROPOFOL  N/A 12/20/2020   Procedure: COLONOSCOPY WITH PROPOFOL ;  Surgeon: Avram Lupita BRAVO, MD;  Location: [facility] ENDOSCOPY;  Service: Endoscopy;  Laterality: N/A;   COLONOSCOPY WITH PROPOFOL  N/A 01/03/2022   Procedure: COLONOSCOPY WITH PROPOFOL ;  Surgeon: Albertus Gordy HERO, MD;  Location: [facility] ENDOSCOPY;  Service: Gastroenterology;   Laterality: N/A;   HEMOSTASIS CLIP PLACEMENT  01/03/2022   Procedure: HEMOSTASIS CLIP PLACEMENT;  Surgeon: Albertus Gordy HERO, MD;  Location: [facility];  Service: Gastroenterology;;   LAPAROSCOPIC PARTIAL COLECTOMY N/A 12/22/2020   Procedure: LAPAROSCOPIC ASSISTED PARTIAL COLECTOMY;  Surgeon: Vernetta Berg, MD;  Location: [facility] OR;  Service: General;  Laterality: N/A;   PACEMAKER IMPLANT N/A 02/28/2020   Procedure: PACEMAKER IMPLANT;  Surgeon: Francyne Headland, MD;  Location: [facility] INVASIVE CV LAB;  Service: Cardiovascular;  Laterality: N/A;   POLYPECTOMY  01/03/2022   Procedure: POLYPECTOMY;  Surgeon: Albertus Gordy HERO, MD;  Location: [facility] ENDOSCOPY;  Service: Gastroenterology;;   RIGHT/LEFT HEART CATH AND CORONARY ANGIOGRAPHY N/A  12/15/2020   Procedure: RIGHT/LEFT HEART CATH AND CORONARY ANGIOGRAPHY;  Surgeon: Anner Alm ORN, MD;  Location: [facility] INVASIVE CV LAB;  Service: Cardiovascular;  Laterality: N/A;   TONSILLECTOMY       FAMILY HISTORY: Family History  Problem Relation Age of Onset   Heart disease Other    Osteoporosis Other    Arthritis Other    Colon cancer Neg Hx    Colon polyps Neg Hx      SOCIAL HISTORY: Social History   Socioeconomic History   Marital status: Married    Spouse name: Not on file   Number of children: Not on file   Years of education: Not on file   Highest education level: Not on file  Occupational History   Not on file  Tobacco Use   Smoking status: Never   Smokeless tobacco: Never  Vaping Use   Vaping status: Never Used  Substance and Sexual Activity   Alcohol  use: No    Alcohol /week: 0.0 standard drinks of alcohol    Drug use: No   Sexual activity: Not on file  Other Topics Concern   Not on file  Social History Narrative   Not on file   Social Drivers of Health   Tobacco Use: Low Risk (04/20/2024)   Patient History    Smoking Tobacco Use: Never    Smokeless Tobacco Use: Never    Passive Exposure: Not on file  Financial Resource Strain: Not on  file  Food Insecurity: Not on file  Transportation Needs: Not on file  Physical Activity: Not on file  Stress: Not on file  Social Connections: Not on file  Intimate Partner Violence: Not on file  Depression (EYV7-0): Not on file  Alcohol  Screen: Not on file  Housing: Not on file  Utilities: Not on file  Health Literacy: Not on file     PHYSICAL EXAM  Vitals:   04/20/24 1442  Weight: 134 lb 8 oz (61 kg)  Height: 5' 2 (1.575 m)   Body mass index is 24.6 kg/m.  Generalized: Well developed, in no acute distress  Cardiology: normal rate and rhythm, no murmur auscultated  Respiratory: clear to auscultation bilaterally    Neurological examination  Mentation: Alert oriented to time, place, history taking. Follows all commands speech and language fluent Cranial nerve II-XII: Pupils were equal round reactive to light. Extraocular movements were full, visual field were full on confrontational test. Facial sensation and strength were normal. Uvula tongue midline. Head turning and shoulder shrug  were normal and symmetric. Motor: The motor testing reveals 5 over 5 strength of all 4 extremities. Good symmetric motor tone is noted throughout.   Gait and station: Gait is normal.    DIAGNOSTIC DATA (LABS, IMAGING, TESTING) - I reviewed patient records, labs, notes, testing and imaging myself where available.  Lab Results  Component Value Date   WBC 5.3 01/07/2022   HGB 13.3 01/07/2022   HCT 39.2 01/07/2022   MCV 92.5 01/07/2022   PLT 196 01/07/2022      Component Value Date/Time   NA 142 02/23/2024 1045   K 4.3 02/23/2024 1045   CL 101 02/23/2024 1045   CO2 27 02/23/2024 1045   GLUCOSE 84 02/23/2024 1045   GLUCOSE 93 01/07/2022 0952   BUN 12 02/23/2024 1045   CREATININE 0.85 02/23/2024 1045   CREATININE 0.89 01/07/2022 0952   CALCIUM  10.7 (H) 02/23/2024 1045   PROT 6.8 06/19/2023 1106   ALBUMIN 4.8 06/19/2023 1106   AST 25  06/19/2023 1106   AST 25 01/07/2022 0952    ALT 22 06/19/2023 1106   ALT 22 01/07/2022 0952   ALKPHOS 67 06/19/2023 1106   BILITOT 0.9 06/19/2023 1106   BILITOT 0.7 01/07/2022 0952   GFRNONAA >60 01/07/2022 0952   GFRAA 88 02/16/2020 1056   Lab Results  Component Value Date   CHOL 161 06/19/2023   HDL 56 06/19/2023   LDLCALC 81 06/19/2023   TRIG 135 06/19/2023   CHOLHDL 2.9 06/19/2023   Lab Results  Component Value Date   HGBA1C 5.7 (H) 06/19/2023   Lab Results  Component Value Date   VITAMINB12 846 12/19/2020   No results found for: TSH     09/16/2023    2:55 PM 03/10/2023    2:04 PM  MMSE - Mini Mental State Exam  Orientation to time 3 4  Orientation to Place 5 5  Registration 3 3  Attention/ Calculation 5 5  Recall 3 3  Language- name 2 objects 2 2  Language- repeat 1 1  Language- follow 3 step command 3 3  Language- read & follow direction 1 0  Write a sentence 1 1  Copy design 0 1  Total score 27 28         No data to display           ASSESSMENT AND PLAN  74 y.o. year old female  has a past medical history of A-fib (HCC), CAD (coronary artery disease), Colon adenocarcinoma (HCC), Complication of anesthesia, Diverticulosis, Esophageal reflux, External hemorrhoids, Gastritis, Hypertension, IDA (iron  deficiency anemia), Internal hemorrhoids, Other and unspecified hyperlipidemia, Presence of permanent cardiac pacemaker (2021), and SSS (sick sinus syndrome) (HCC). here with    No diagnosis found.  Citlaly Camplin reports memory is fairly stable. Previous MMSE 27/30. Will repeat in 6 months. We have reviewed labs, CT and MRI results. We have discussed ATN profile from 2024. I have encouraged her remain mentally active and increase physical activity. Consider neurocognitive evaluation if she wishes. Reviewed memantine/donepezil and new infusion options for management of neurodegenerative disease. She will follow up with PCP as directed. She will return to see me in 6 months, sooner if needed. She  verbalizes understanding and agreement with this plan.   No orders of the defined types were placed in this encounter.    No orders of the defined types were placed in this encounter.  I personally spent a total of 30 minutes in the care of the patient today including preparing to see the patient, getting/reviewing separately obtained history, performing a medically appropriate exam/evaluation, counseling and educating, placing orders, documenting clinical information in the EHR, independently interpreting results, and communicating results.   Greig Forbes, MSN, FNP-C 04/20/2024, 3:04 PM  [facility] [address], [address] [place], [place] [address] [phone]  "

## 2024-05-19 ENCOUNTER — Encounter: Admitting: Psychology

## 2024-07-02 ENCOUNTER — Ambulatory Visit

## 2024-10-19 ENCOUNTER — Ambulatory Visit: Admitting: Neurology
